# Patient Record
Sex: Male | Born: 1955 | Race: White | Hispanic: No | Marital: Single | Smoking: Current every day smoker
Health system: Southern US, Community
[De-identification: ages and names within clinical notes are randomized; demographics above are authoritative.]

## PROBLEM LIST (undated history)

## (undated) DIAGNOSIS — F329 Major depressive disorder, single episode, unspecified: Secondary | ICD-10-CM

## (undated) DIAGNOSIS — F17200 Nicotine dependence, unspecified, uncomplicated: Secondary | ICD-10-CM

## (undated) DIAGNOSIS — I10 Essential (primary) hypertension: Secondary | ICD-10-CM

## (undated) DIAGNOSIS — F32A Depression, unspecified: Secondary | ICD-10-CM

## (undated) DIAGNOSIS — M069 Rheumatoid arthritis, unspecified: Secondary | ICD-10-CM

## (undated) DIAGNOSIS — J449 Chronic obstructive pulmonary disease, unspecified: Secondary | ICD-10-CM

## (undated) DIAGNOSIS — Z720 Tobacco use: Secondary | ICD-10-CM

## (undated) HISTORY — DX: Nicotine dependence, unspecified, uncomplicated: F17.200

## (undated) HISTORY — PX: MULTIPLE TOOTH EXTRACTIONS: SHX2053

## (undated) HISTORY — DX: Rheumatoid arthritis, unspecified: M06.9

## (undated) HISTORY — DX: Tobacco use: Z72.0

## (undated) HISTORY — DX: Depression, unspecified: F32.A

## (undated) HISTORY — DX: Chronic obstructive pulmonary disease, unspecified: J44.9

## (undated) HISTORY — PX: ANKLE SURGERY: SHX546

---
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
## 1998-08-19 ENCOUNTER — Encounter: Payer: Self-pay | Admitting: Emergency Medicine

## 1998-08-19 ENCOUNTER — Emergency Department (HOSPITAL_COMMUNITY): Admission: EM | Admit: 1998-08-19 | Discharge: 1998-08-19 | Payer: Self-pay | Admitting: Emergency Medicine

## 2002-07-11 ENCOUNTER — Encounter: Payer: Self-pay | Admitting: Emergency Medicine

## 2002-07-11 ENCOUNTER — Emergency Department (HOSPITAL_COMMUNITY): Admission: EM | Admit: 2002-07-11 | Discharge: 2002-07-11 | Payer: Self-pay | Admitting: Emergency Medicine

## 2007-04-29 ENCOUNTER — Emergency Department (HOSPITAL_COMMUNITY): Admission: EM | Admit: 2007-04-29 | Discharge: 2007-04-29 | Payer: Self-pay | Admitting: Emergency Medicine

## 2009-01-15 ENCOUNTER — Emergency Department (HOSPITAL_COMMUNITY): Admission: EM | Admit: 2009-01-15 | Discharge: 2009-01-15 | Payer: Self-pay | Admitting: Emergency Medicine

## 2010-02-22 ENCOUNTER — Ambulatory Visit (HOSPITAL_COMMUNITY): Admission: RE | Admit: 2010-02-22 | Discharge: 2010-02-22 | Payer: Self-pay | Admitting: Oral Surgery

## 2010-05-03 ENCOUNTER — Emergency Department (HOSPITAL_COMMUNITY): Admission: EM | Admit: 2010-05-03 | Discharge: 2010-05-04 | Payer: Self-pay | Admitting: Emergency Medicine

## 2010-05-03 IMAGING — CR DG CHEST 1V PORT
1 series · 1 of 1 positions shown · non-contrast
Comparison: None.

CLINICAL DATA: Cough, shortness of breath, smoking history

PORTABLE CHEST - 1 VIEW

[view not recorded]
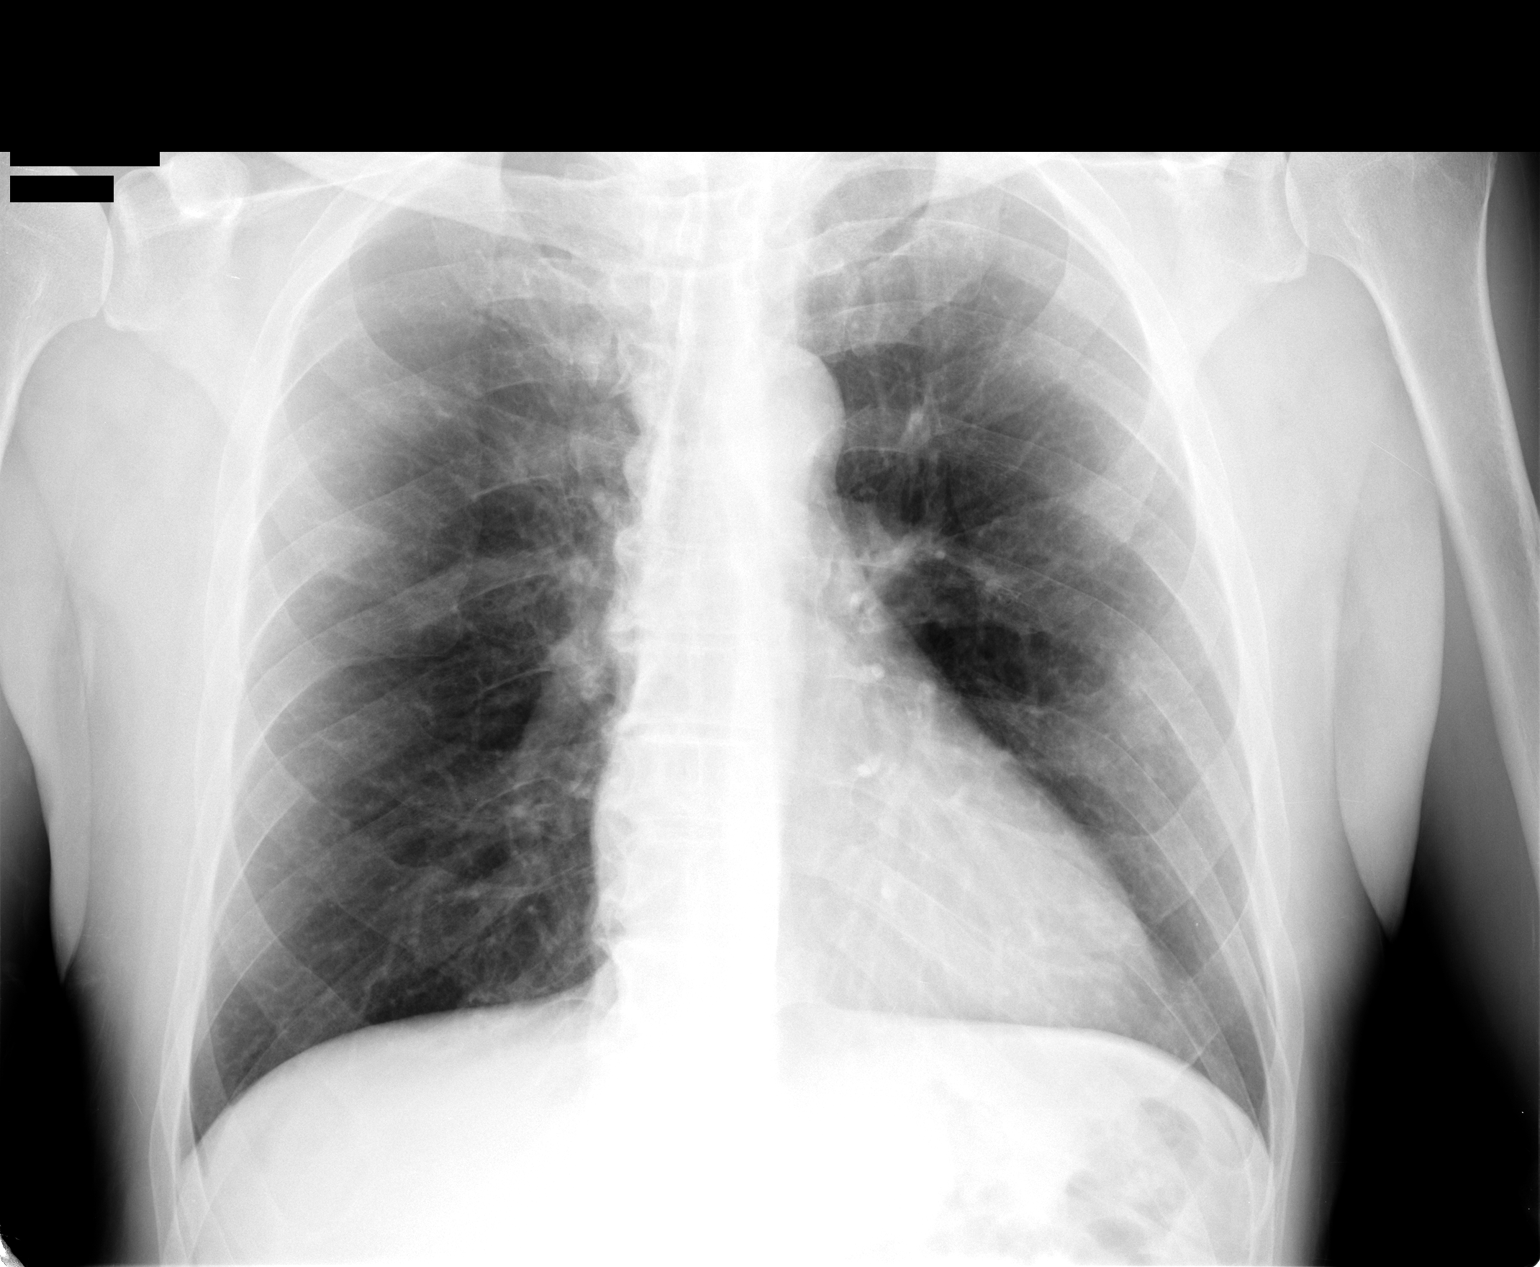

[1 of 1 positions shown; findings below may reference images not displayed]

FINDINGS: The lungs are clear.  Mediastinal contours appear normal.
The heart is mildly enlarged.  There are degenerative changes in
the thoracic spine.
IMPRESSION: No active lung disease.

## 2010-05-03 IMAGING — CT CT HEAD W/O CM
1 series · 16 of 30 positions shown, 20 images · non-contrast
Comparison: None.

CLINICAL DATA: Headache, neck and shoulder pain, weakness, cough

CT HEAD WITHOUT CONTRAST
TECHNIQUE: Contiguous axial images were obtained from the base of
the skull through the vertex without contrast.

[Series 2: headseq 4.8 h37s · axial · 0.43mm/px · z∈[+135,+290]mm · 16 of 36 slices shown, 20 images]
[im 2/36  brain]
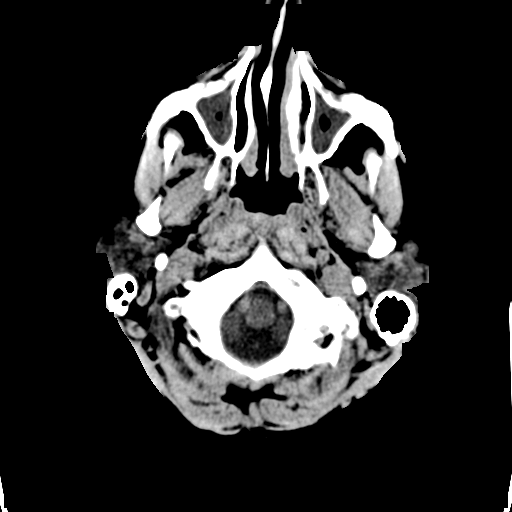
[im 2/36  bone]
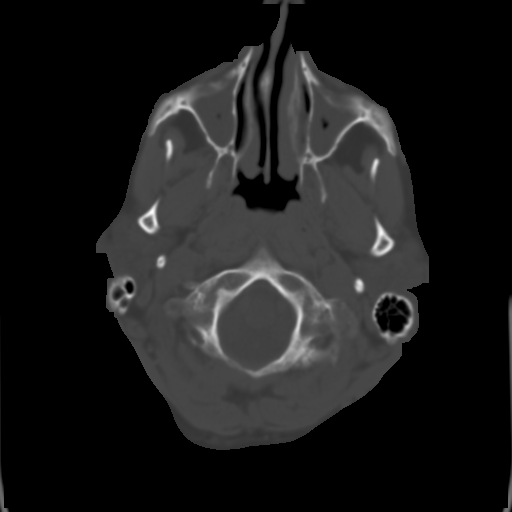
[im 4/36  brain]
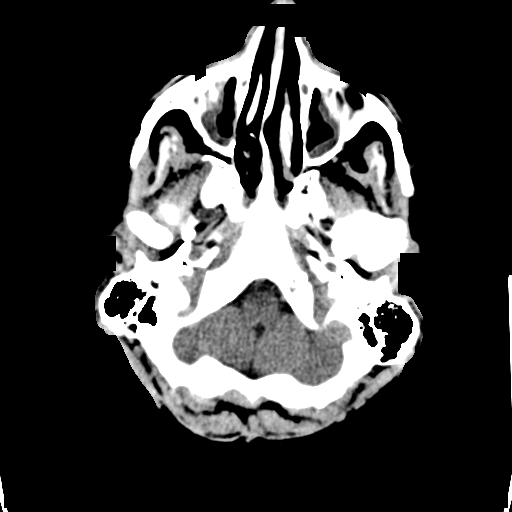
[im 7/36  brain]
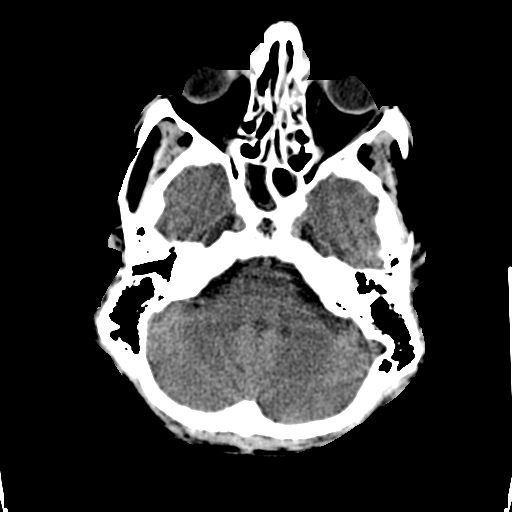
[im 9/36  brain]
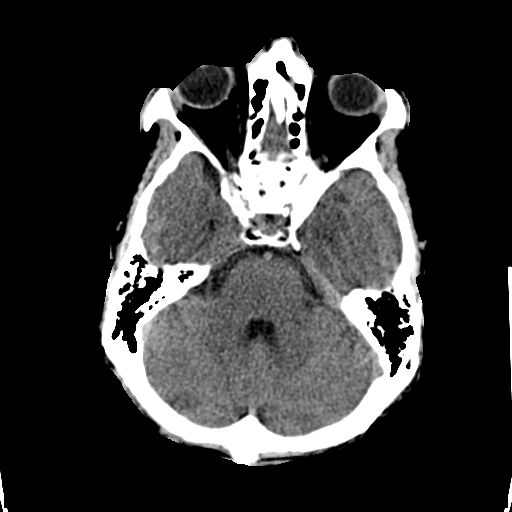
[im 10/36  brain]
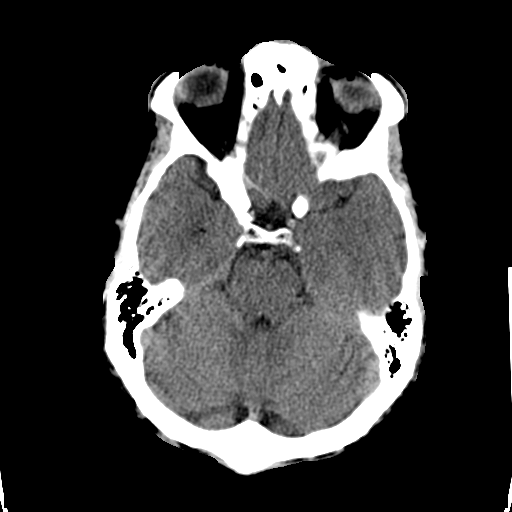
[im 10/36  bone]
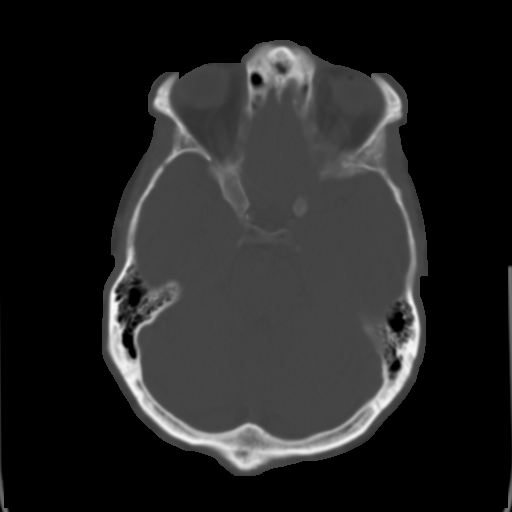
[im 13/36  brain]
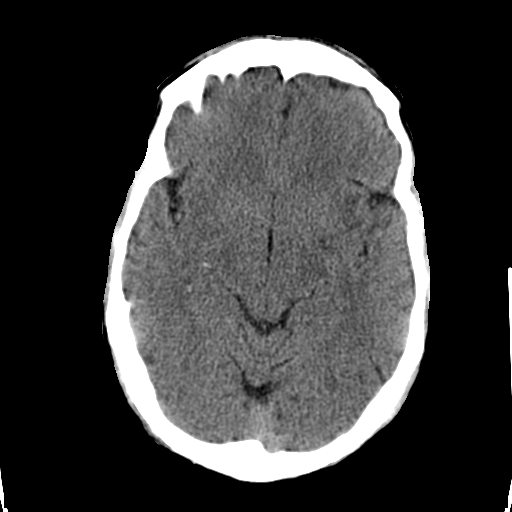
[im 15/36  brain]
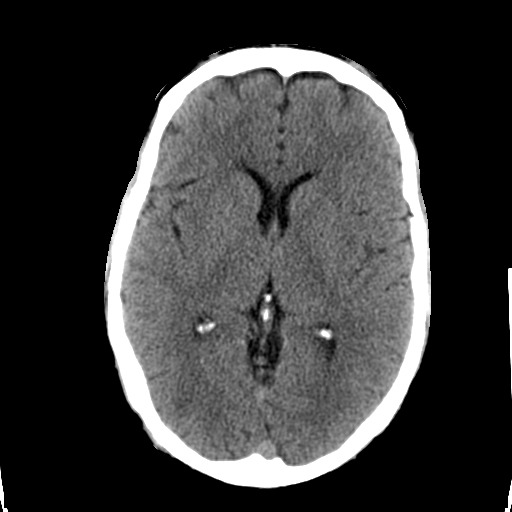
[im 17/36  brain]
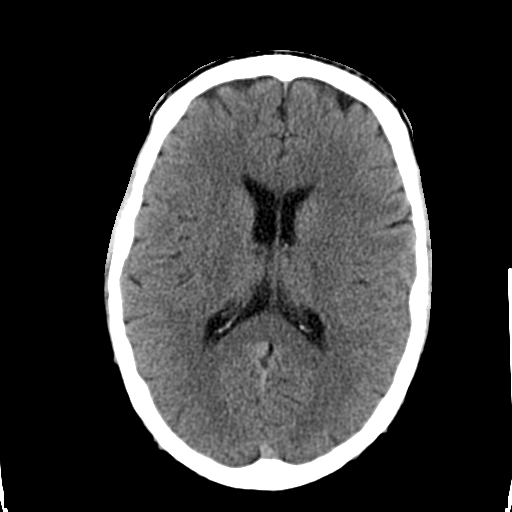
[im 19/36  brain]
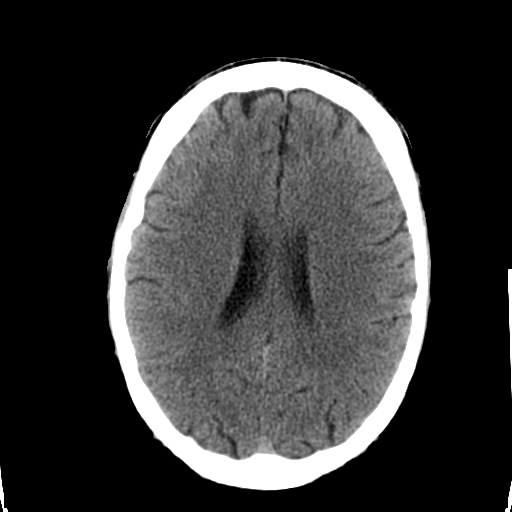
[im 19/36  bone]
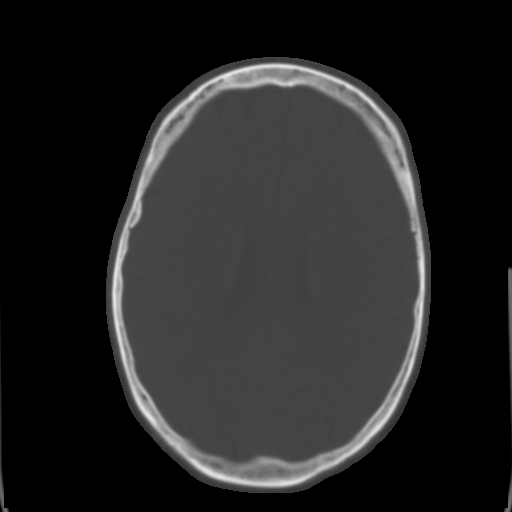
[im 21/36  brain]
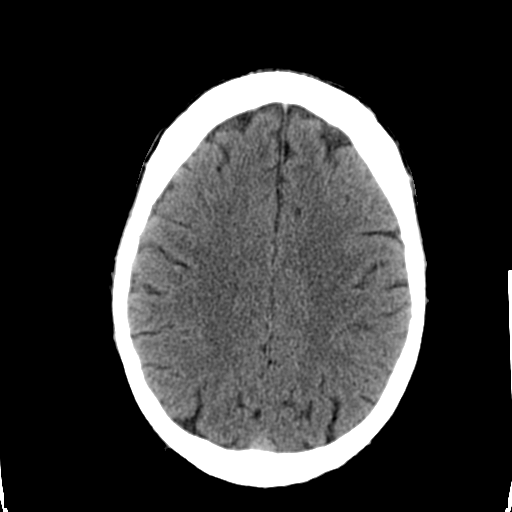
[im 23/36  brain]
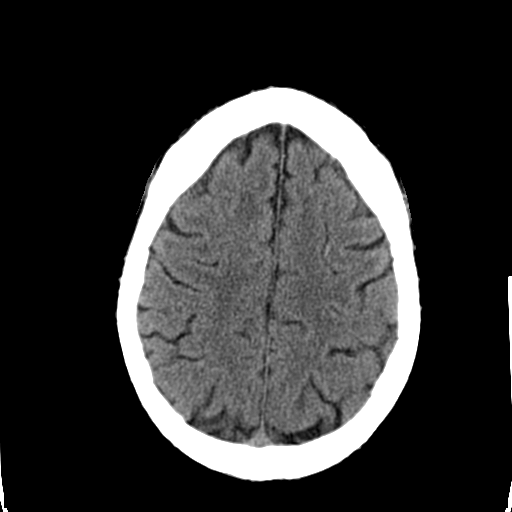
[im 26/36  brain]
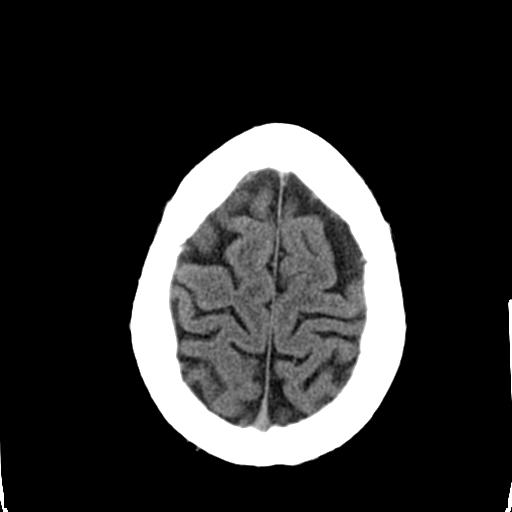
[im 27/36  brain]
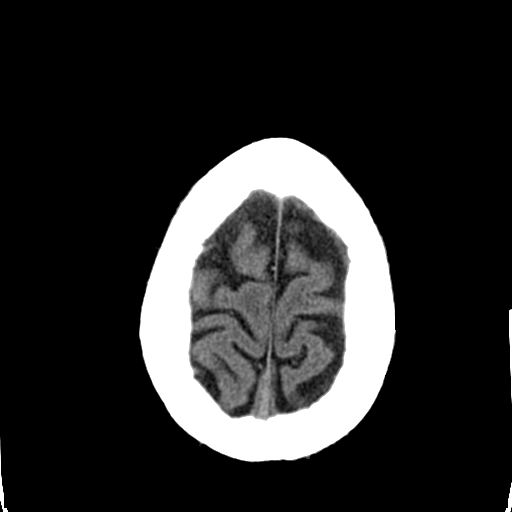
[im 27/36  bone]
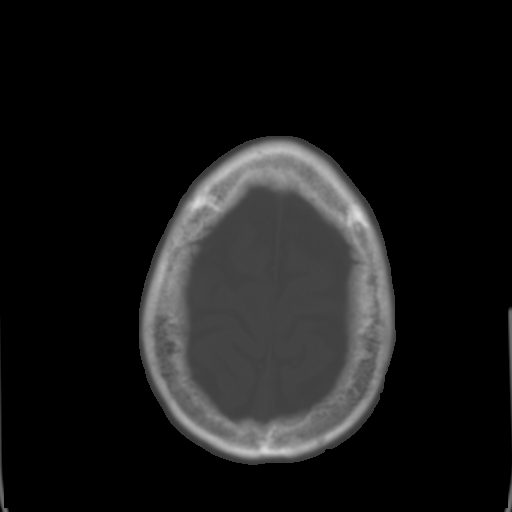
[im 29/36  brain]
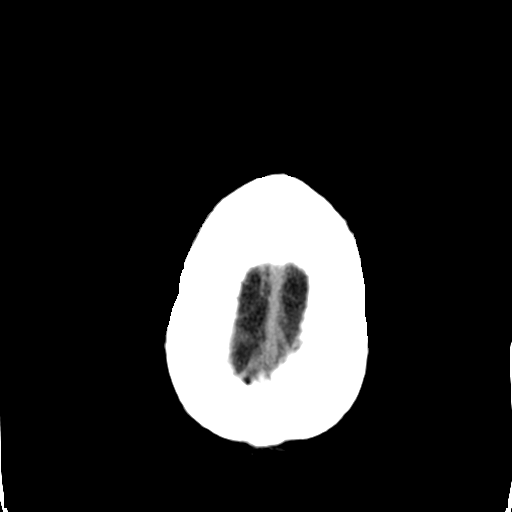
[im 32/36  brain]
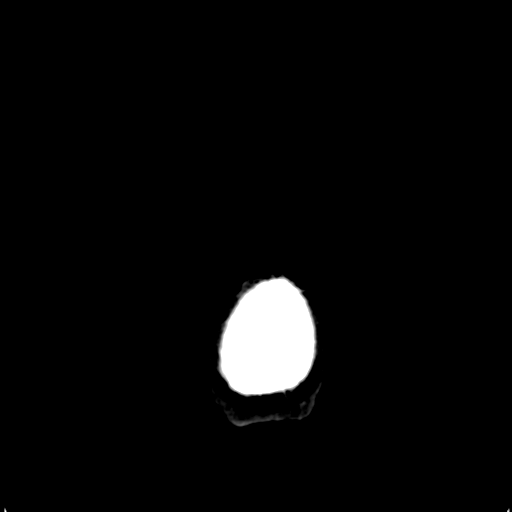
[im 34/36  brain]
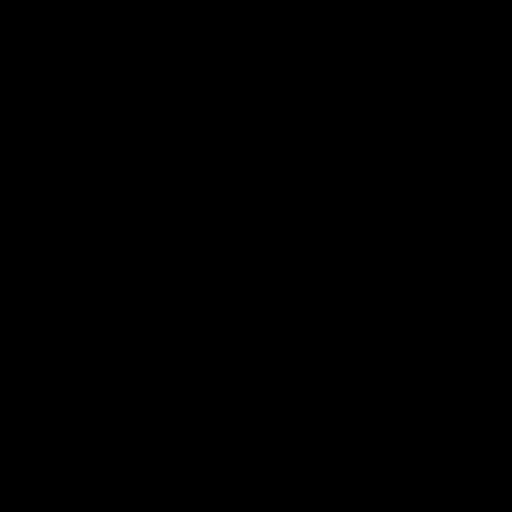

[16 of 30 positions shown; findings below may reference images not displayed]

FINDINGS: The ventricular system is normal in size and
configuration, and the septum is normal in a normal midline
position.  The fourth ventricle and basilar cisterns appear normal.
No hemorrhage, mass lesion, or acute infarction is seen.  On bone
window images there is considerable mucosal edema within the
maxillary sinuses with opacification of multiple ethmoid air cells
and mucosal edema within the sphenoid sinus consistent with diffuse
paranasal sinus disease.  The frontal sinuses are poorly developed.
No calvarial abnormality is seen.
IMPRESSION: 1.  No acute intracranial abnormality.
2.  Diffuse paranasal sinus disease.

## 2010-11-03 LAB — DIFFERENTIAL
Eosinophils Relative: 1 % (ref 0–5)
Lymphocytes Relative: 21 % (ref 12–46)
Lymphs Abs: 2 10*3/uL (ref 0.7–4.0)
Monocytes Absolute: 0.6 10*3/uL (ref 0.1–1.0)

## 2010-11-03 LAB — URINALYSIS, ROUTINE W REFLEX MICROSCOPIC
Hgb urine dipstick: NEGATIVE
Protein, ur: NEGATIVE mg/dL
Urobilinogen, UA: 2 mg/dL — ABNORMAL HIGH (ref 0.0–1.0)

## 2010-11-03 LAB — BASIC METABOLIC PANEL
BUN: 8 mg/dL (ref 6–23)
CO2: 24 mEq/L (ref 19–32)
Calcium: 9.3 mg/dL (ref 8.4–10.5)
Chloride: 105 mEq/L (ref 96–112)
Creatinine, Ser: 0.95 mg/dL (ref 0.4–1.5)
GFR calc Af Amer: >60 mL/min (ref >60)
GFR calc non Af Amer: >60 mL/min (ref >60)
Glucose, Bld: 87 mg/dL (ref 70–99)
Potassium: 3.6 mEq/L (ref 3.5–5.1)
Sodium: 137 mEq/L (ref 135–145)

## 2010-11-03 LAB — CBC
HCT: 43.7 % (ref 39.0–52.0)
Hemoglobin: 14.8 g/dL (ref 13.0–17.0)
MCH: 31.8 pg (ref 26.0–34.0)
MCHC: 33.8 g/dL (ref 30.0–36.0)
MCV: 94.1 fL (ref 78.0–100.0)
Platelets: 280 10*3/uL (ref 150–400)
RBC: 4.64 MIL/uL (ref 4.22–5.81)
RDW: 13.1 % (ref 11.5–15.5)
WBC: 9.6 10*3/uL (ref 4.0–10.5)

## 2010-11-03 LAB — HEPATIC FUNCTION PANEL
ALT: 23 U/L (ref 0–53)
Alkaline Phosphatase: 86 U/L (ref 39–117)
Bilirubin, Direct: 0.2 mg/dL (ref 0.0–0.3)
Indirect Bilirubin: 0.6 mg/dL (ref 0.3–0.9)

## 2010-11-06 LAB — BASIC METABOLIC PANEL
CO2: 28 mEq/L (ref 19–32)
Calcium: 9.6 mg/dL (ref 8.4–10.5)
Creatinine, Ser: 1.11 mg/dL (ref 0.4–1.5)
Glucose, Bld: 98 mg/dL (ref 70–99)
Sodium: 139 mEq/L (ref 135–145)

## 2010-11-06 LAB — CBC
Hemoglobin: 14.9 g/dL (ref 13.0–17.0)
MCH: 33.5 pg (ref 26.0–34.0)
MCHC: 35.3 g/dL (ref 30.0–36.0)
MCV: 95 fL (ref 78.0–100.0)

## 2010-11-29 LAB — URINALYSIS, ROUTINE W REFLEX MICROSCOPIC
Bilirubin Urine: NEGATIVE
Glucose, UA: NEGATIVE mg/dL
Ketones, ur: NEGATIVE mg/dL
Protein, ur: NEGATIVE mg/dL

## 2011-06-02 LAB — ROCKY MTN SPOTTED FVR AB, IGM-BLOOD: RMSF IgM: 0.17

## 2011-06-02 LAB — ROCKY MTN SPOTTED FVR AB, IGG-BLOOD: RMSF IgG: <1:64 {titer}

## 2015-02-02 ENCOUNTER — Encounter (HOSPITAL_COMMUNITY): Payer: Self-pay

## 2015-02-02 ENCOUNTER — Emergency Department (HOSPITAL_COMMUNITY): Payer: Self-pay

## 2015-02-02 ENCOUNTER — Emergency Department (HOSPITAL_COMMUNITY)
Admission: EM | Admit: 2015-02-02 | Discharge: 2015-02-02 | Disposition: A | Discharge status: Home / Self Care | Payer: Self-pay | Attending: Emergency Medicine | Admitting: Emergency Medicine

## 2015-02-02 DIAGNOSIS — M2550 Pain in unspecified joint: Secondary | ICD-10-CM

## 2015-02-02 DIAGNOSIS — Z9889 Other specified postprocedural states: Secondary | ICD-10-CM | POA: Insufficient documentation

## 2015-02-02 DIAGNOSIS — R002 Palpitations: Secondary | ICD-10-CM

## 2015-02-02 DIAGNOSIS — M25571 Pain in right ankle and joints of right foot: Secondary | ICD-10-CM | POA: Insufficient documentation

## 2015-02-02 DIAGNOSIS — I10 Essential (primary) hypertension: Secondary | ICD-10-CM | POA: Insufficient documentation

## 2015-02-02 DIAGNOSIS — Z720 Tobacco use: Secondary | ICD-10-CM | POA: Insufficient documentation

## 2015-02-02 DIAGNOSIS — R0789 Other chest pain: Secondary | ICD-10-CM | POA: Insufficient documentation

## 2015-02-02 HISTORY — DX: Essential (primary) hypertension: I10

## 2015-02-02 IMAGING — DX DG CHEST 2V
2 series · 2 of 2 positions shown · non-contrast
Comparison: [DATE]

CLINICAL DATA: Diffuse chest pain on and off for 2-3 months

EXAM:
CHEST  2 VIEW

[chest pa]
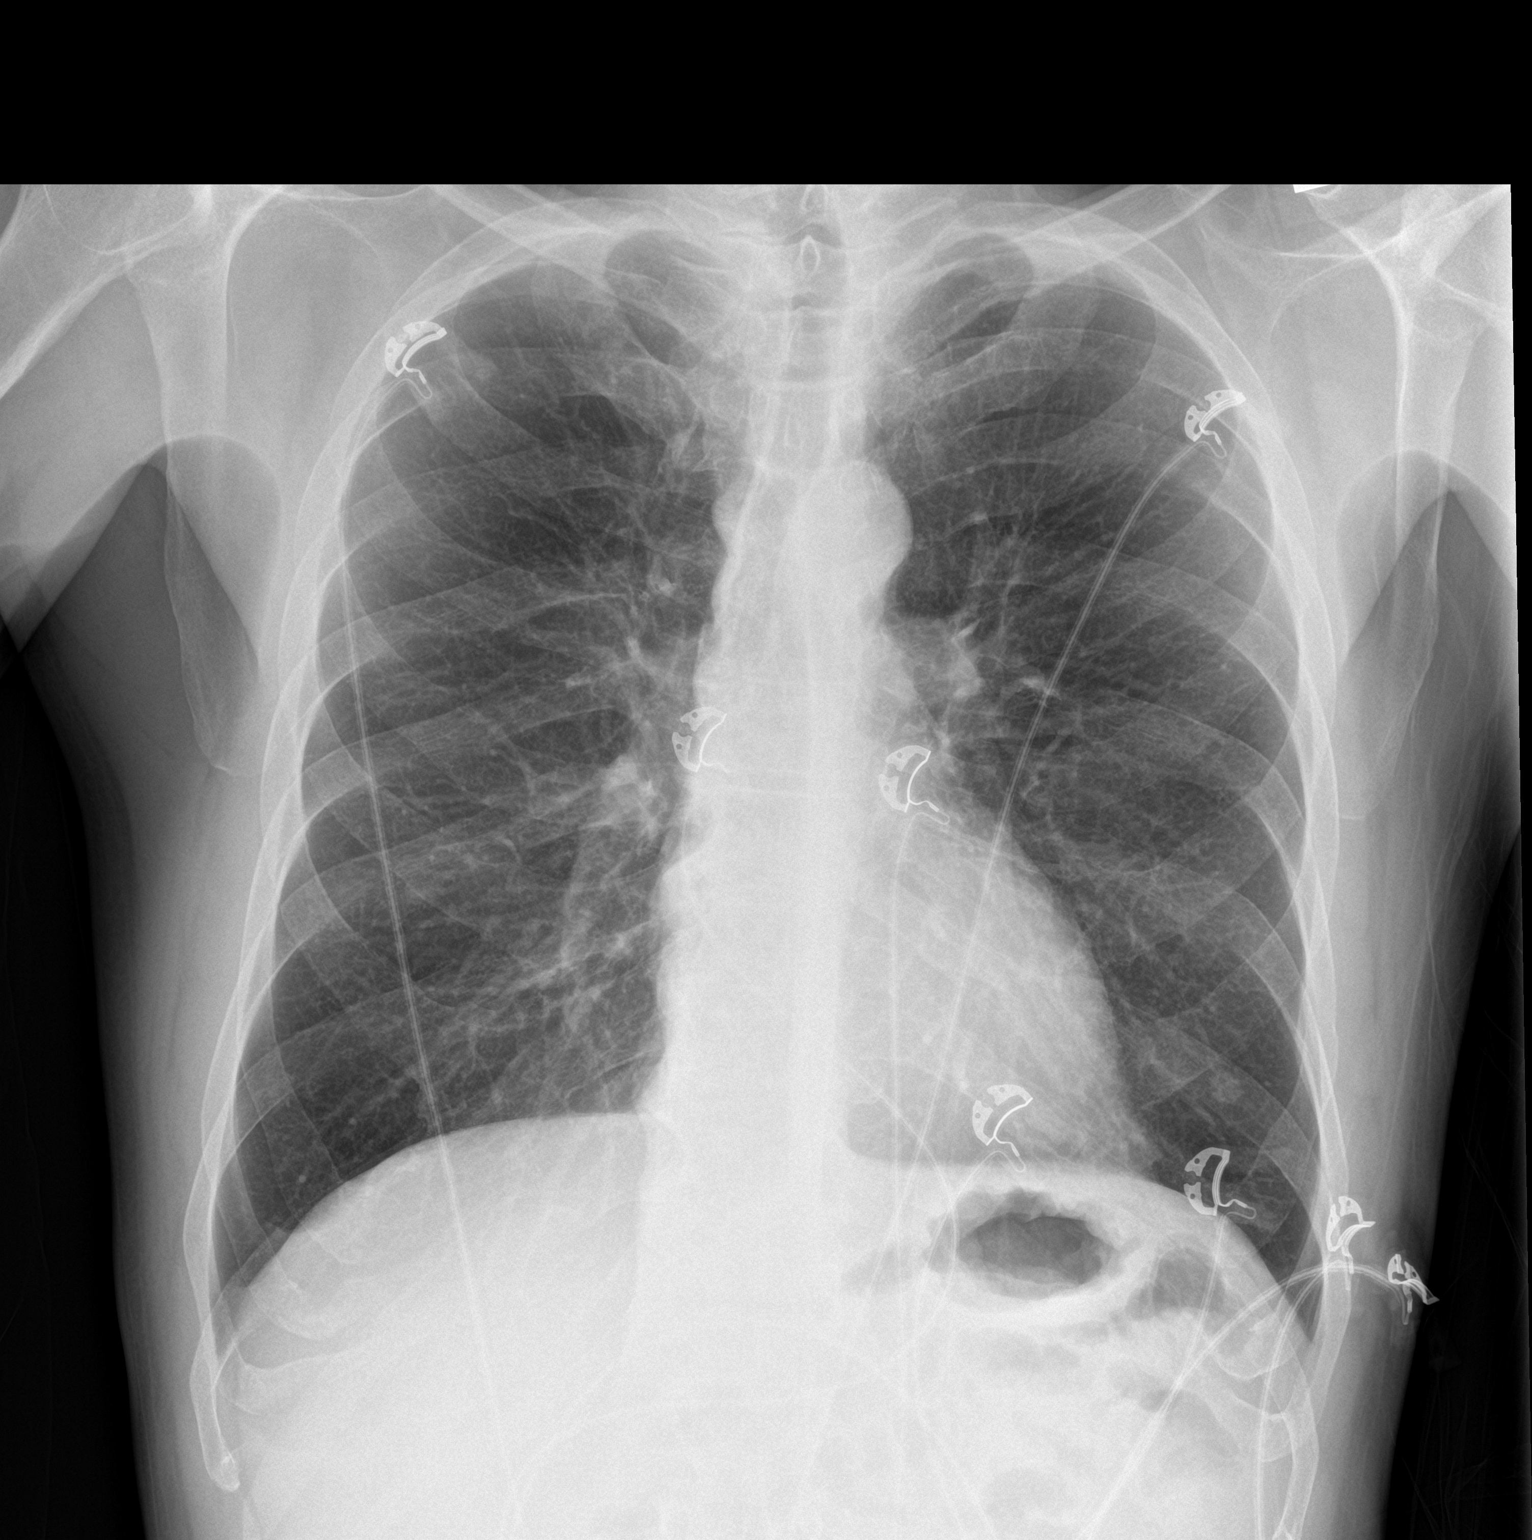

[chest lat]
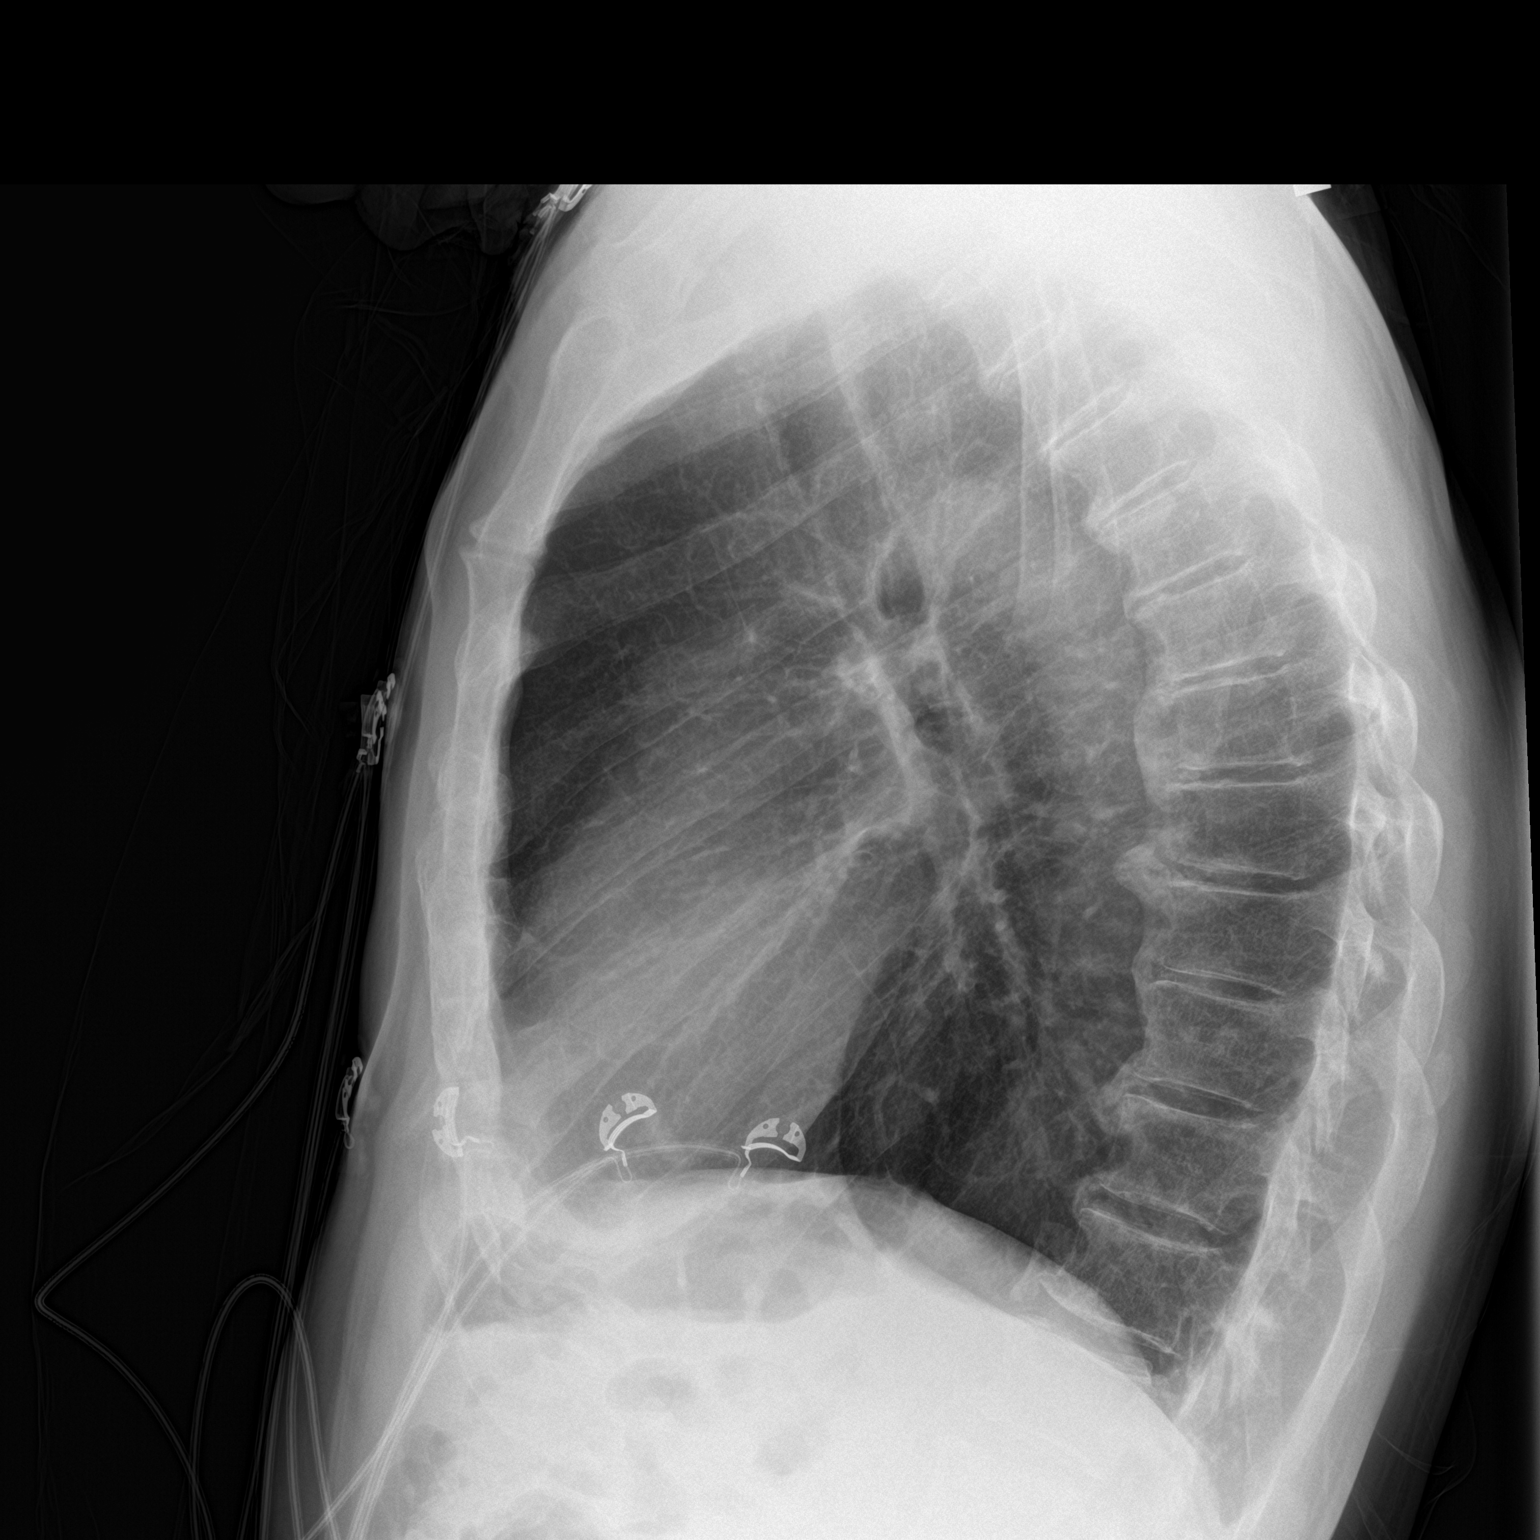

[2 of 2 positions shown; findings below may reference images not displayed]

FINDINGS: Normal heart size and mediastinal contours. No acute infiltrate or
edema. No effusion or pneumothorax. No acute osseous findings.

10 mm nodular density overlapping the posterior left ninth rib. No
sclerotic focus seen on the previous study and there is no symmetric
opacity or visible nipple in the lateral projection to confirm
nipple shadow. Given current every day smoking status, would favor
chest CT follow-up rather than repeat with nipple markers.
IMPRESSION: 1. No active cardiopulmonary disease.
2. 10 mm nodule at the left base may be an nipple shadow, but
noncontrast chest CT follow-up is recommended to exclude nodule in
this current every day smoker.

## 2015-02-02 IMAGING — DX DG ANKLE COMPLETE 3+V*R*
3 series · 3 of 3 positions shown · non-contrast
Comparison: None.

CLINICAL DATA: Pain and swelling for 4 days.  No recent trauma

EXAM:
RIGHT ANKLE - COMPLETE 3+ VIEW

[ankle ap]
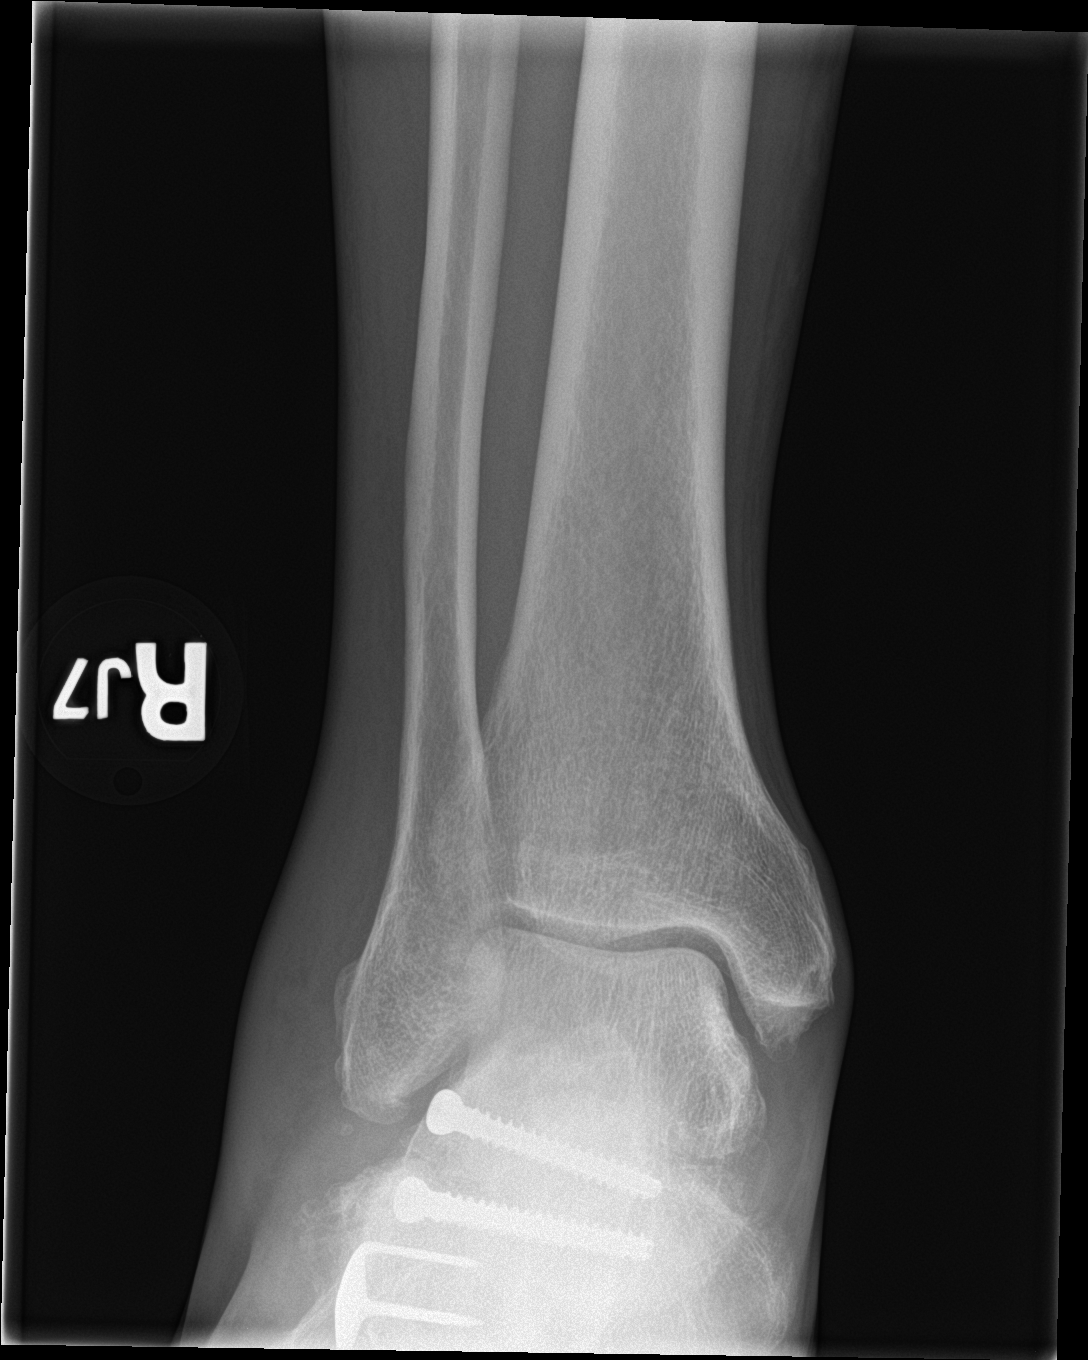

[ankle obl]
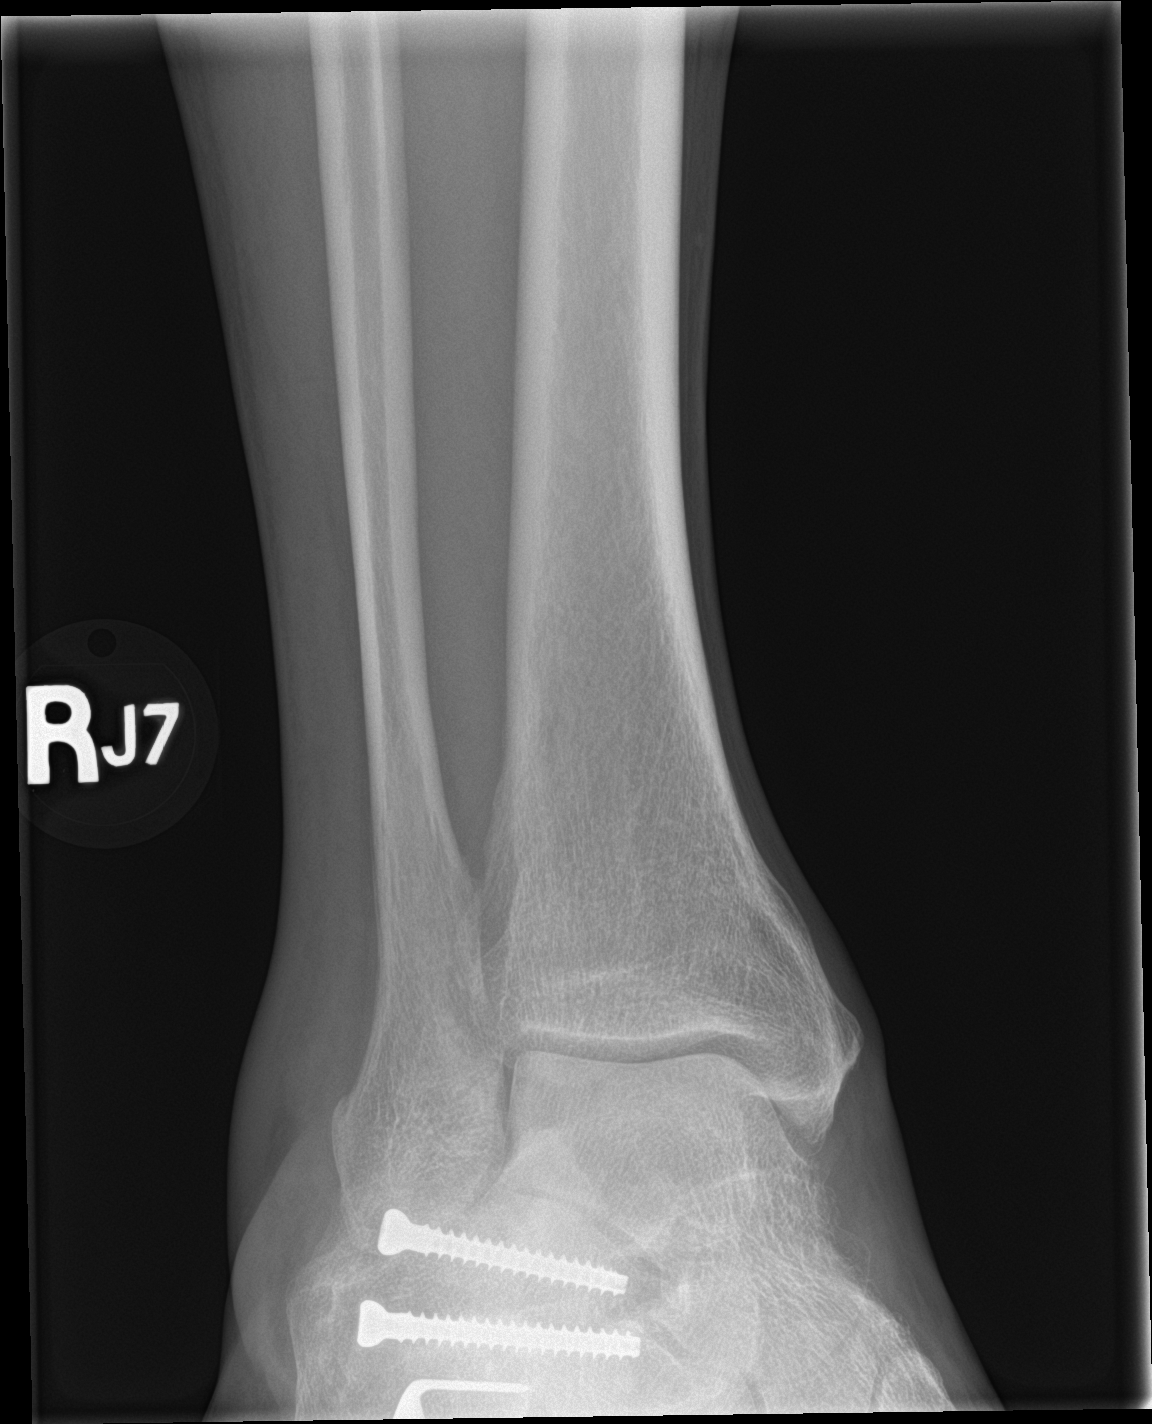

[ankle lat]
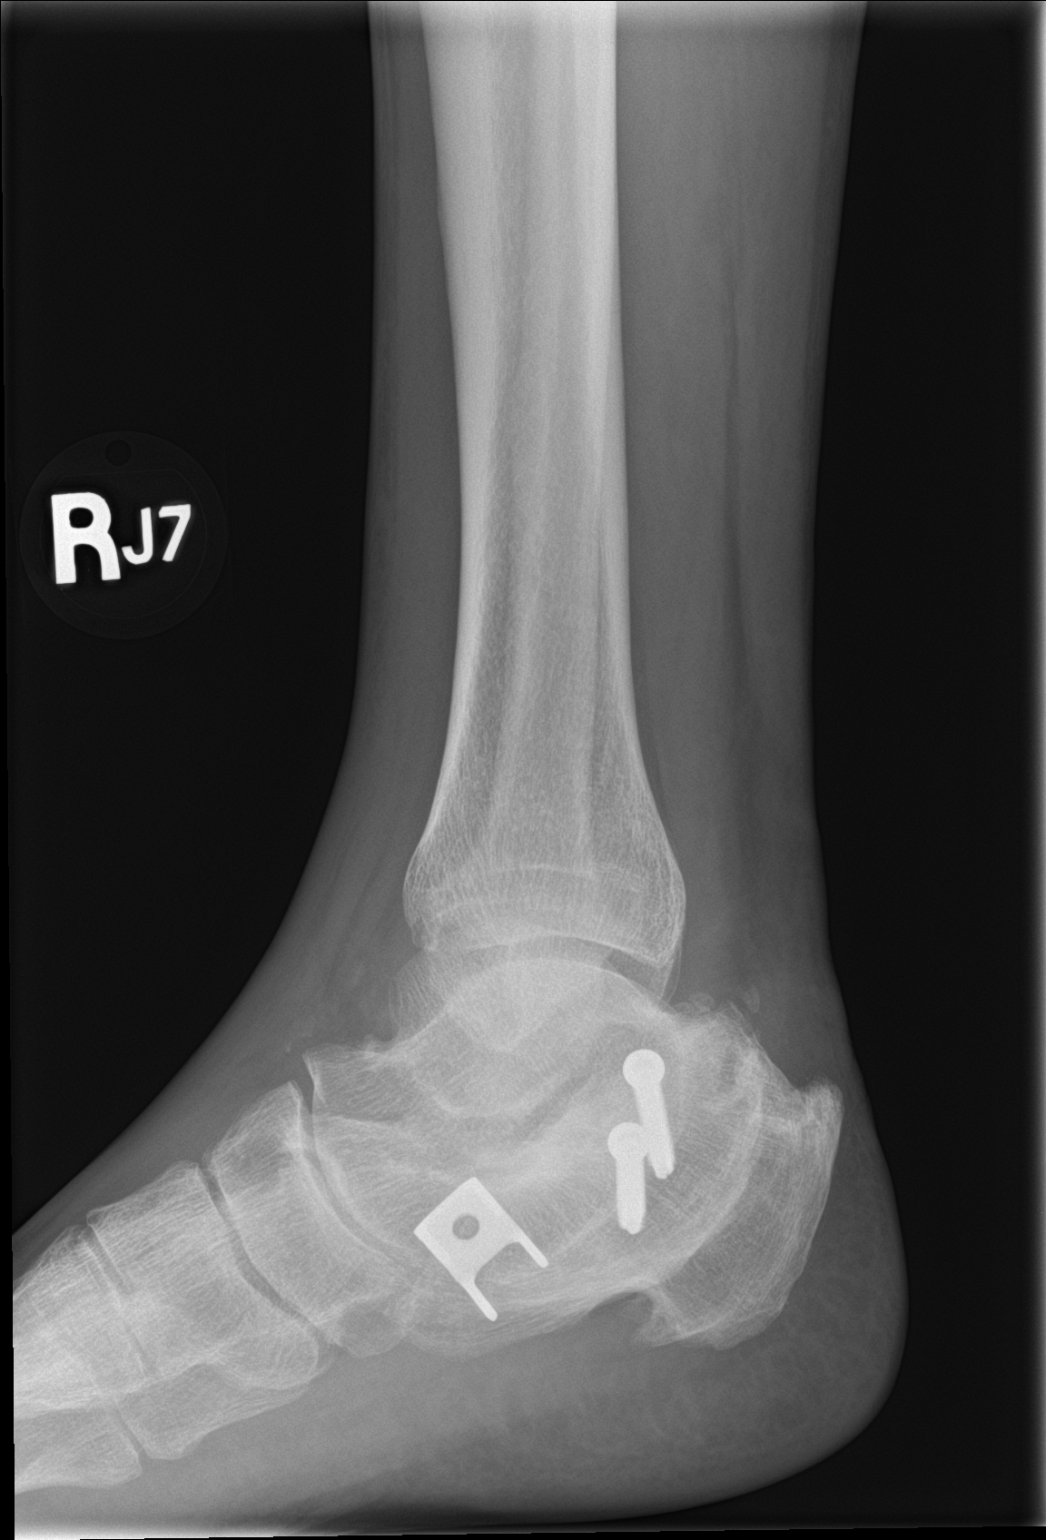

[3 of 3 positions shown; findings below may reference images not displayed]

FINDINGS: Frontal, oblique, and lateral views were obtained. There is evidence
of old trauma involving the calcaneus with remodeling and
postoperative change. There is moderate osteoarthritic change in the
ankle joint. There are spurs arising from the posterior and inferior
calcaneus.

There is no acute fracture. There is soft tissue swelling laterally.
The ankle mortise appears intact. There is no appreciable joint
effusion.
IMPRESSION: Soft tissue swelling laterally. No acute fracture. Mortise intact.
Evidence of old calcaneal trauma with remodeling and postoperative
change. Moderate osteoarthritic change. Inferior and posterior
calcaneal spurs are present.

## 2015-02-02 MED ORDER — DOXYCYCLINE HYCLATE 100 MG PO CAPS: 100.0000 mg | ORAL_CAPSULE | Freq: Two times a day (BID) | ORAL | Status: DC

## 2015-02-02 MED ORDER — DEXAMETHASONE SODIUM PHOSPHATE 10 MG/ML IJ SOLN
10.0000 mg | Freq: Once | INTRAMUSCULAR | Status: AC
  Administered 2015-02-02: 10 mg via INTRAMUSCULAR
  Filled 2015-02-02: qty 1

## 2015-02-02 MED ORDER — HYDROCODONE-ACETAMINOPHEN 5-325 MG PO TABS: 2.0000 | ORAL_TABLET | ORAL | Status: DC | PRN

## 2015-02-02 NOTE — ED Provider Notes (Signed)
CSN: [id number]     Arrival date & time 02/02/15  1158 History  This chart was scribed for Orpah Greek, MD by Rayna Sexton, ED scribe. This patient was seen in room APA04/APA04 and the patient's care was started at 12:17 PM.    Chief Complaint  Patient presents with  . Foot Pain  . Palpitations    The history is provided by the patient. No language interpreter was used.    HPI Comments: Drew Nguyen is a 59 y.o. male, with a history of HTN, smoking and ankle surgery, who presents to the Emergency Department complaining of constant, mild, right ankle pain with onset 3 days ago. Pt notes a past surgery to the affected ankle 20 years prior but denies any history of similar pain s/p the procedure. Pt additionally notes pain to his left ankle as well as chronic chest pains with worsening symptoms when the pain intensifies. 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 He denies any history of gout. Pt denies any other associated symptoms.    Past Medical History  Diagnosis Date  . Hypertension    Past Surgical History  Procedure Laterality Date  . Ankle surgery     No family history on file. History  Substance Use Topics  . Smoking status: Current Every Day Smoker  . Smokeless tobacco: Not on file  . Alcohol Use: Yes     Comment: occ    Review of Systems  Cardiovascular: Positive for chest pain.  Musculoskeletal: Positive for myalgias, joint swelling and arthralgias.  All other systems reviewed and are negative.     Allergies  Review of patient's allergies indicates no known allergies.  Home Medications   Prior to Admission medications   Medication Sig Start Date End Date Taking? Authorizing Provider  ibuprofen (ADVIL,MOTRIN) 200 MG tablet Take 400 mg by mouth every 6 (six) hours as needed for moderate pain.   Yes Historical Provider, MD   BP 123/95 mmHg  Pulse 86  Temp(Src) 98 F (36.7 C) (Oral)  Resp 18  Ht '5\' 9"'$  (1.753 m)   Wt 156 lb (70.761 kg)  BMI 23.03 kg/m2  SpO2 100% Physical Exam  Constitutional: He is oriented to person, place, and time. He appears well-developed and well-nourished. No distress.  HENT:  Head: Normocephalic and atraumatic.  Right Ear: Hearing normal.  Left Ear: Hearing normal.  Nose: Nose normal.  Mouth/Throat: Oropharynx is clear and moist and mucous membranes are normal.  Eyes: Conjunctivae and EOM are normal. Pupils are equal, round, and reactive to light.  Neck: Normal range of motion. Neck supple.  Cardiovascular: Regular rhythm, S1 normal and S2 normal.  Exam reveals no gallop and no friction rub.   No murmur heard. Pulmonary/Chest: Effort normal and breath sounds normal. No respiratory distress. He exhibits no tenderness.  Abdominal: Soft. Normal appearance and bowel sounds are normal. There is no hepatosplenomegaly. There is no tenderness. There is no rebound, no guarding, no tenderness at McBurney's point and negative Murphy's sign. No hernia.  Musculoskeletal: He exhibits edema and tenderness.  Swelling right lateral ankle with tenderness and erythema  Neurological: He is alert and oriented to person, place, and time. He has normal strength. No cranial nerve deficit or sensory deficit. Coordination normal. GCS eye subscore is 4. GCS verbal subscore is 5. GCS motor subscore is 6.  Skin: Skin is warm, dry and intact. No rash noted. No cyanosis.  Psychiatric: He has a normal mood and affect. His  speech is normal and behavior is normal. Thought content normal.  Nursing note and vitals reviewed.   ED Course  Procedures  DIAGNOSTIC STUDIES: Oxygen Saturation is 100% on RA, normal by my interpretation.    COORDINATION OF CARE: 12:21 PM Discussed treatment plan with pt at bedside and pt agreed to plan.  Labs Review Labs Reviewed - No data to display  Imaging Review Dg Chest 2 View  02/02/2015   CLINICAL DATA:  Diffuse chest pain on and off for 2-3 months  EXAM: CHEST  2  VIEW  COMPARISON:  05/03/2010  FINDINGS: Normal heart size and mediastinal contours. No acute infiltrate or edema. No effusion or pneumothorax. No acute osseous findings.  10 mm nodular density overlapping the posterior left ninth rib. No sclerotic focus seen on the previous study and there is no symmetric opacity or visible nipple in the lateral projection to confirm nipple shadow. Given current every day smoking status, would favor chest CT follow-up rather than repeat with nipple markers.  IMPRESSION: 1. No active cardiopulmonary disease. 2. 10 mm nodule at the left base may be an nipple shadow, but noncontrast chest CT follow-up is recommended to exclude nodule in this current every day smoker.   Electronically Signed   By: Monte Fantasia M.D.   On: 02/02/2015 13:35   Dg Ankle Complete Right  02/02/2015   CLINICAL DATA:  Pain and swelling for 4 days.  No recent trauma  EXAM: RIGHT ANKLE - COMPLETE 3+ VIEW  COMPARISON:  None.  FINDINGS: Frontal, oblique, and lateral views were obtained. There is evidence of old trauma involving the calcaneus with remodeling and postoperative change. 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 There are spurs arising from the posterior and inferior calcaneus.  There is no acute fracture. There is soft tissue swelling laterally. The ankle mortise appears intact. There is no appreciable joint effusion.  IMPRESSION: Soft tissue swelling laterally. No acute fracture. Mortise intact. Evidence of old calcaneal trauma with remodeling and postoperative change. Moderate osteoarthritic change. Inferior and posterior calcaneal spurs are present.   Electronically Signed   By: Lowella Grip III M.D.   On: 02/02/2015 13:35     EKG Interpretation   Date/Time:  Tuesday February 02 2015 12:15:29 EDT Ventricular Rate:  74 PR Interval:  146 QRS Duration: 89 QT Interval:  381 QTC Calculation: 423 R Axis:   -43 Text Interpretation:  Sinus rhythm Left axis deviation  Otherwise within  normal limits Confirmed by POLLINA  MD, CHRISTOPHER [phone]) on 02/02/2015  12:41:20 PM      MDM   Final diagnoses:  Palpitations   ankle pain, possible gout  Patient presents to the ER for evaluation of pain and swelling of the right ankle. Examination did reveal erythema over the lateral malleolus, no obvious joint effusion. X-ray does not show any acute abnormality other than lateral soft tissue swelling. Patient is afebrile with normal vital signs. This does not suggest septic joint. Cannot rule out early cellulitis, although favor gout. Will treat for both.  Patient also complaining of palpitations which occur intermittently. It is not occurring currently. Chest x-ray was clear. He is a smoker. He will require outpatient follow-up for possible nodule. Will refer for primary care. EKG does not show any arrhythmia.  I personally performed the services described in this documentation, which was scribed in my presence. The recorded information has been reviewed and is accurate.     Orpah Greek, MD 02/02/15 1350

## 2015-02-02 NOTE — Discharge Instructions (Signed)
Palpitations A palpitation is the feeling that your heartbeat is irregular. It may feel like your heart is fluttering or skipping a beat. It may also feel like your heart is beating faster than normal. This is usually not a serious problem. In some cases, you may need more medical tests. HOME CARE  Avoid:  Caffeine in coffee, tea, soft drinks, diet pills, and energy drinks.  Chocolate.  Alcohol.  Stop smoking if you smoke.  Reduce your stress and anxiety. Try:  A method that measures bodily functions so you can learn to control them (biofeedback).  Yoga.  Meditation.  Physical activity such as swimming, jogging, or walking.  Get plenty of rest and sleep. GET HELP IF:  Your fast or irregular heartbeat continues after 24 hours.  Your palpitations occur more often. GET HELP RIGHT AWAY IF:   You have chest pain.  You feel short of breath.  You have a very bad headache.  You feel dizzy or pass out (faint). MAKE SURE YOU:   Understand these instructions.  Will watch your condition.  Will get help right away if you are not doing well or get worse. Document Released: 05/16/2008 Document Revised: 12/22/2013 Document Reviewed: 10/06/2011 [facility] Patient Information 2015 [place], [place]. This information is not intended to replace advice given to you by your health care provider. Make sure you discuss any questions you have with your health care provider. Gout Gout is an inflammatory arthritis caused by a buildup of uric acid crystals in the joints. Uric acid is a chemical that is normally present in the blood. When the level of uric acid in the blood is too high it can form crystals that deposit in your joints and tissues. This causes joint redness, soreness, and swelling (inflammation). Repeat attacks are common. Over time, uric acid crystals can form into masses (tophi) near a joint, destroying bone and causing disfigurement. Gout is treatable and often preventable. CAUSES    The disease begins with elevated levels of uric acid in the blood. Uric acid is produced by your body when it breaks down a naturally found substance called purines. Certain foods you eat, such as meats and fish, contain high amounts of purines. Causes of an elevated uric acid level include:  Being passed down from parent to child (heredity).  Diseases that cause increased uric acid production (such as obesity, psoriasis, and certain cancers).  Excessive alcohol use.  Diet, especially diets rich in meat and seafood.  Medicines, including certain cancer-fighting medicines (chemotherapy), water pills (diuretics), and aspirin.  Chronic kidney disease. The kidneys are no longer able to remove uric acid well.  Problems with metabolism. Conditions strongly associated with gout include:  Obesity.  High blood pressure.  High cholesterol.  Diabetes. Not everyone with elevated uric acid levels gets gout. It is not understood why some people get gout and others do not. Surgery, joint injury, and eating too much of certain foods are some of the factors that can lead to gout attacks. SYMPTOMS   An attack of gout comes on quickly. It causes intense pain with redness, swelling, and warmth in a joint.  Fever can occur.  Often, only one joint is involved. Certain joints are more commonly involved:  Base of the big toe.  Knee.  Ankle.  Wrist.  Finger. Without treatment, an attack usually goes away in a few days to weeks. Between attacks, you usually will not have symptoms, which is different from many other forms of arthritis. DIAGNOSIS  Your caregiver  will suspect gout based on your symptoms and exam. In some cases, tests may be recommended. The tests may include:  Blood tests.  Urine tests.  X-rays.  Joint fluid exam. This exam requires a needle to remove fluid from the joint (arthrocentesis). Using a microscope, gout is confirmed when uric acid crystals are seen in the joint  fluid. TREATMENT  There are two phases to gout treatment: treating the sudden onset (acute) attack and preventing attacks (prophylaxis).  Treatment of an Acute Attack.  Medicines are used. These include anti-inflammatory medicines or steroid medicines.  An injection of steroid medicine into the affected joint is sometimes necessary.  The painful joint is rested. Movement can worsen the arthritis.  You may use warm or cold treatments on painful joints, depending which works best for you.  Treatment to Prevent Attacks.  If you suffer from frequent gout attacks, your caregiver may advise preventive medicine. These medicines are started after the acute attack subsides. These medicines either help your kidneys eliminate uric acid from your body or decrease your uric acid production. You may need to stay on these medicines for a very long time.  The early phase of treatment with preventive medicine can be associated with an increase in acute gout attacks. For this reason, during the first few months of treatment, your caregiver may also advise you to take medicines usually used for acute gout treatment. Be sure you understand your caregiver's directions. Your caregiver may make several adjustments to your medicine dose before these medicines are effective.  Discuss dietary treatment with your caregiver or dietitian. Alcohol and drinks high in sugar and fructose and foods such as meat, poultry, and seafood can increase uric acid levels. Your caregiver or dietitian can advise you on drinks and foods that should be limited. HOME CARE INSTRUCTIONS   Do not take aspirin to relieve pain. This raises uric acid levels.  Only take over-the-counter or prescription medicines for pain, discomfort, or fever as directed by your caregiver.  Rest the joint as much as possible. When in bed, keep sheets and blankets off painful areas.  Keep the affected joint raised (elevated).  Apply warm or cold treatments  to painful joints. Use of warm or cold treatments depends on which works best for you.  Use crutches if the painful joint is in your leg.  Drink enough fluids to keep your urine clear or pale yellow. This helps your body get rid of uric acid. Limit alcohol, sugary drinks, and fructose drinks.  Follow your dietary instructions. Pay careful attention to the amount of protein you eat. Your daily diet should emphasize fruits, vegetables, whole grains, and fat-free or low-fat milk products. Discuss the use of coffee, vitamin C, and cherries with your caregiver or dietitian. These may be helpful in lowering uric acid levels.  Maintain a healthy body weight. SEEK MEDICAL CARE IF:   You develop diarrhea, vomiting, or any side effects from medicines.  You do not feel better in 24 hours, or you are getting worse. SEEK IMMEDIATE MEDICAL CARE IF:   Your joint becomes suddenly more tender, and you have chills or a fever. MAKE SURE YOU:   Understand these instructions.  Will watch your condition.  Will get help right away if you are not doing well or get worse. Document Released: 08/04/2000 Document Revised: 12/22/2013 Document Reviewed: 03/20/2012 [facility] Patient Information 2015 [place], [place]. This information is not intended to replace advice given to you by your health care provider. Make sure you  discuss any questions you have with your health care provider. ° °

## 2015-02-02 NOTE — ED Notes (Addendum)
Pt c/o pain in r ankle and foot for past few days.  Denies injury.  Reports surgery to area over 20 years ago.  Pt also says feels like heart is skipping for " a while."  Pt says being in pain makes it worse.    Redness noted to r ankle, pedal pulse present, and capillary refill wnl.

## 2015-02-02 NOTE — ED Notes (Signed)
MD at bedside. 

## 2018-07-31 ENCOUNTER — Institutional Professional Consult (permissible substitution): Payer: Self-pay | Admitting: Internal Medicine

## 2018-09-13 ENCOUNTER — Ambulatory Visit (INDEPENDENT_AMBULATORY_CARE_PROVIDER_SITE_OTHER): Payer: Medicaid Other | Admitting: Pulmonary Disease

## 2018-09-13 ENCOUNTER — Encounter: Payer: Self-pay | Admitting: Pulmonary Disease

## 2018-09-13 VITALS — BP 160/94 | HR 65 | Ht 69.0 in | Wt 138.0 lb

## 2018-09-13 DIAGNOSIS — J449 Chronic obstructive pulmonary disease, unspecified: Secondary | ICD-10-CM | POA: Insufficient documentation

## 2018-09-13 DIAGNOSIS — J4489 Other specified chronic obstructive pulmonary disease: Secondary | ICD-10-CM

## 2018-09-13 DIAGNOSIS — Z720 Tobacco use: Secondary | ICD-10-CM | POA: Diagnosis not present

## 2018-09-13 DIAGNOSIS — Z23 Encounter for immunization: Secondary | ICD-10-CM | POA: Diagnosis not present

## 2018-09-13 DIAGNOSIS — M069 Rheumatoid arthritis, unspecified: Secondary | ICD-10-CM | POA: Diagnosis not present

## 2018-09-13 MED ORDER — BUPROPION HCL ER (SR) 150 MG PO TB12: 150.0000 mg | ORAL_TABLET | Freq: Two times a day (BID) | ORAL | 2 refills | Status: DC

## 2018-09-13 NOTE — Assessment & Plan Note (Signed)
Schedule PFTs-based on this we will decide if we need to change her medications Flu shot today.  Meanwhile continue on Symbicort 2 puffs twice daily. Use Combivent 2 puffs every 6 hours as needed for breakthrough symptoms. Reviewed MDI technique

## 2018-09-13 NOTE — Assessment & Plan Note (Signed)
  Rheumatology consultation for steroid sparing agents

## 2018-09-13 NOTE — Patient Instructions (Addendum)
  Schedule PFTs-based on this we will decide if we need to change her medications Flu shot today.  Rheumatology consultation for rheumatoid arthritis.  Referral to lung cancer screening program  Prescription for Wellbutrin 150 mg twice daily, this should help you cut down on cigarettes

## 2018-09-13 NOTE — Progress Notes (Signed)
Subjective:    Patient ID: Drew Nguyen, male    DOB: 1956/02/28, 63 y.o.   MRN: [id number]  HPI   Chief Complaint  Patient presents with  . Pulm Consult    Referred by Volney Presser NP for COPD. States he has occasional SOB.     63 year old heavy smoker presents to establish care for management of COPD. He smokes a pack and 1/2/day, more than 60 pack years. He has longstanding rheumatoid arthritis and has been maintained on steroids for at least more than 5 years and is also on methotrexate.  He has been unable to get a rheumatology consultation since he did not have insurance for many years until he finally got Medicaid.  He reports dyspnea on exertion after walking short distances. He reports a daily a.m. cough with minimal white sputum production. Chest x-ray was obtained by PCP which was reportedly clear, I do not have this x-ray to review.  He inquires about possibility of lung cancer. He has been unable to quit.  In the remote past, his PCP gave him Valium with which she was able to decrease to 4 cigarettes/day.  He denies frequent chest colds. 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  I went through all his inhalers and reviewed his MDI technique which appears to be okay. His biggest problem appears to be his severe crippling arthritis     Past Medical History:  Diagnosis Date  . Hypertension    Past Surgical History:  Procedure Laterality Date  . ANKLE SURGERY      No Known Allergies  Social History   Socioeconomic History  . Marital status: Single    Spouse name: Not on file  . Number of children: Not on file  . Years of education: Not on file  . Highest education level: Not on file  Occupational History  . Not on file  Social Needs  . Financial resource strain: Not on file  . Food insecurity:    Worry: Not on file    Inability: Not on file  . Transportation needs:    Medical: Not on file      Non-medical: Not on file  Tobacco Use  . Smoking status: Current Every Day Smoker  Substance and Sexual Activity  . Alcohol use: Yes    Comment: occ  . Drug use: No  . Sexual activity: Not on file  Lifestyle  . Physical activity:    Days per week: Not on file    Minutes per session: Not on file  . Stress: Not on file  Relationships  . Social connections:    Talks on phone: Not on file    Gets together: Not on file    Attends religious service: Not on file    Active member of club or organization: Not on file    Attends meetings of clubs or organizations: Not on file    Relationship status: Not on file  . Intimate partner violence:    Fear of current or ex partner: Not on file    Emotionally abused: Not on file    Physically abused: Not on file    Forced sexual activity: Not on file  Other Topics Concern  . Not on file  Social History Narrative  . Not on file     History reviewed. No pertinent family history.    Review of Systems Constitutional: negative for anorexia, fevers and sweats  Eyes: negative for irritation, redness and visual disturbance  Ears, nose, mouth, throat, and face: negative for earaches, epistaxis, nasal congestion and sore throat  Respiratory: negative for  sputum and wheezing  Cardiovascular: negative for chest pain,lower extremity edema, orthopnea, palpitations and syncope  Gastrointestinal: negative for abdominal pain, constipation, diarrhea, melena, nausea and vomiting  Genitourinary:negative for dysuria, frequency and hematuria  Hematologic/lymphatic: negative for bleeding, easy bruising and lymphadenopathy  Musculoskeletal:positive for arthralgias, muscle weakness and stiff joints  Neurological: negative for coordination problems, , headaches and weakness  Endocrine: negative for diabetic symptoms including polydipsia, polyuria and weight loss     Objective:   Physical Exam  Gen. Pleasant, well-nourished, in no distress, normal  affect ENT - no pallor,icterus, no post nasal drip Neck: No JVD, no thyromegaly, no carotid bruits Lungs: hunch, no use of accessory muscles, no dullness to percussion, clear without rales or rhonchi  Cardiovascular: Rhythm regular, heart sounds  normal, no murmurs or gallops, no peripheral edema Abdomen: soft and non-tender, no hepatosplenomegaly, BS normal. Musculoskeletal:  Swollen PIP joints, no cyanosis or clubbing Neuro:  alert, non focal       Assessment & Plan:

## 2018-09-13 NOTE — Assessment & Plan Note (Signed)
  Referral to lung cancer screening program  Prescription for Wellbutrin 150 mg twice daily, this should help you cut down on cigarettes

## 2018-10-03 ENCOUNTER — Other Ambulatory Visit: Payer: Self-pay | Admitting: Acute Care

## 2018-10-03 DIAGNOSIS — F17210 Nicotine dependence, cigarettes, uncomplicated: Principal | ICD-10-CM

## 2018-10-03 DIAGNOSIS — Z122 Encounter for screening for malignant neoplasm of respiratory organs: Secondary | ICD-10-CM

## 2018-10-24 NOTE — Progress Notes (Signed)
_0  ID: Drew Nguyen, male    DOB: [birth year], 63 y.o.   MRN: [id number]  Chief Complaint  Patient presents with  . Follow-up    'breathing about the same'     Referring provider: No ref. provider found  HPI:  63 year old male current every day smoker initially referred to our office for evaluation of COPD  PMH: Rheumatoid arthritis Smoker/ Smoking History: Current smoker.  1.5 packs/day.  72-pack-year smoking history. Maintenance: Symbicort 160 Pt of: Dr. Elsworth Soho  10/25/2018  - Visit   63 year old male current every day smoker presenting to our office today for pulmonary function testing.  Pulmonary function test results listed below.  10/25/2018-pulmonary function test-FVC 4.18 (101% predicted), postbronchodilator ratio 64, postbronchodilator FEV1 2.73 (88% predicted), slight mid flow reversibility after bronchodilator.  No bronchodilator response, DLCO 82  Patient reporting today that he continues to have struggles swallowing pills.  Patient also reports that he has stopped methotrexate he reports he has contacted rheumatology and that they are aware that he has stopped methotrexate.  Patient continues to take his prednisone.  Patient feels that the methotrexate was "making him feel weird".  Patient has yet to see rheumatology for an office visit.  Per chart review patient has scheduled office visits with rheumatology in April/2020.  Patient reports adherence to his Symbicort 160.  Patient has received his flu vaccine this year but has never received a pneumonia vaccine.  Patient continues to smoke 1.5 packs/day.  Patient reports that sometimes this is even more than 1/2 packs/day.  Patient attributes this to his anxiety and how he copes with stress.  He has tried a multitude of smoking sensation therapies before: Nicotine replacement therapies, currently maintained on Wellbutrin, etc.  Patient reports the Wellbutrin has not helped at all since last office visit with curving cravings.   Patient reports the only medication that has helped in the past was when he was prescribed Valium for smoking sensation.  Patient is not willing to stop smoking at this time.  Patient is unwilling to try any smoking cessation therapies at this time.  MMRC - Breathlessness Score 3 - I stop for breath after walking about 100 yards or after a few minutes on level ground (isle at grocery store is 171f)  Of note, patient worked in [employer].  Patient also worked in [employer]   Tests:   Chart review >>> Patient has scheduled lung cancer screening CT in March/2020  10/25/2018-pulmonary function test-FVC 4.18 (101% predicted), postbronchodilator ratio 64, postbronchodilator FEV1 2.73 (88% predicted), slight mid flow reversibility after bronchodilator.  No bronchodilator response, DLCO 82   FENO:  No results found for: NITRICOXIDE  PFT: PFT Results Latest Ref Rng & Units 10/25/2018  FVC-Pre L 4.18  FVC-Predicted Pre % 101  FVC-Post L 4.28  FVC-Predicted Post % 104  Pre FEV1/FVC % % 64  Post FEV1/FCV % % 64  FEV1-Pre L 2.69  FEV1-Predicted Pre % 87  FEV1-Post L 2.73  DLCO UNC% % 82  DLCO COR %Predicted % 75  TLC L 6.86  TLC % Predicted % 109  RV % Predicted % 130    Imaging: No results found.    Specialty Problems      Pulmonary Problems   COPD GOLD I, still smoking    10/25/2018-pulmonary function test-FVC 4.18 (101% predicted), postbronchodilator ratio 64, postbronchodilator FEV1 2.73 (88% predicted), slight mid flow reversibility after bronchodilator.  No bronchodilator response, DLCO 82  No Known Allergies  Immunization History  Administered Date(s) Administered  . Influenza,inj,Quad PF,6+ Mos 09/13/2018  . Pneumococcal Polysaccharide-23 10/25/2018   Needs Pnueumovax23 >>> will get today  Past Medical History:  Diagnosis Date  . Hypertension     Tobacco History: Social History   Tobacco Use  Smoking Status Current  Every Day Smoker  . Packs/day: 1.50  . Years: 48.00  . Pack years: 72.00  . Types: Cigarettes  Smokeless Tobacco Never Used  Tobacco Comment   10/25/18 1 to 1 1/2 ppd   Ready to quit: No Counseling given: Yes Comment: 10/25/18 1 to 1 1/2 ppd  Smoking assessment and cessation counseling  Patient currently smoking: 1.5 ppd I have advised the patient to quit/stop smoking as soon as possible due to high risk for multiple medical problems.  It will also be very difficult for [place] to manage patient's  respiratory symptoms and status if we continue to expose her lungs to a known irritant.  We do not advise e-cigarettes as a form of stopping smoking.  Patient is not willing to quit smoking.  I have advised the patient that we can assist and have options of nicotine replacement therapy, provided smoking cessation education today, provided smoking cessation counseling, and provided cessation resources. Pt unwilling to try any smoking cessation at this time. Pt reports he used valium in the past to curb smoking cravings. Currently taking wellbutrin but no changes in smoking cessation today. Tried patches before - got sick during them. Tried gum and lozenges and and neither helped.   Follow-up next office visit office visit for assessment of smoking cessation.  Smoking cessation counseling advised for: 8 min     Outpatient Encounter Medications as of 10/25/2018  Medication Sig  . buPROPion (WELLBUTRIN SR) 150 MG 12 hr tablet Take 1 tablet (150 mg total) by mouth 2 (two) times daily.  . meloxicam (MOBIC) 7.5 MG tablet TAKE 1 TABLET BY MOUTH 1 TO 2 TIMES DAILY  . predniSONE (DELTASONE) 10 MG tablet TAKE 1 TABLET BY MOUTH IN THE MORNING AND 1 2 (ONE HALF) IN THE EVENING  . PROAIR HFA 108 (90 Base) MCG/ACT inhaler TO INHALE 1 TO 2 PUFFS EVERY 4 TO 6 HOURS AS NEEDED FOR COUGH WHEEZE OR SHORTNESS OF BREATH  . tamsulosin (FLOMAX) 0.4 MG CAPS capsule TAKE 1 CAPSULE BY MOUTH ONCE DAILY FOR URINARY TRACT  .  [DISCONTINUED] folic acid (FOLVITE) 1 MG tablet Take 1 mg by mouth daily.  . [DISCONTINUED] methotrexate 2.5 MG tablet TAKE TWO TABLETS BY MOUTH THREE TIMES WEEKLY  . [DISCONTINUED] SYMBICORT 160-4.5 MCG/ACT inhaler Inhale 2 puffs into the lungs 2 (two) times daily.  Marland Kitchen umeclidinium-vilanterol (ANORO ELLIPTA) 62.5-25 MCG/INH AEPB Inhale 1 puff into the lungs daily.  . [DISCONTINUED] umeclidinium-vilanterol (ANORO ELLIPTA) 62.5-25 MCG/INH AEPB Inhale 1 puff into the lungs daily.   No facility-administered encounter medications on file as of 10/25/2018.      Review of Systems  Review of Systems  Constitutional: Positive for fatigue. Negative for activity change, chills, fever and unexpected weight change.  HENT: Negative for postnasal drip, rhinorrhea, sneezing and sore throat.   Eyes: Negative.   Respiratory: Positive for shortness of breath. Negative for cough, chest tightness and wheezing.   Cardiovascular: Negative for chest pain and palpitations.  Gastrointestinal: Negative for diarrhea, nausea and vomiting.       Patient reports chronic issues with swallowing, patient chokes on food as well as pills, patient has never had a swallow study  or been evaluated by GI  Endocrine: Negative.   Musculoskeletal: Negative.        No diagnosis of rheumatoid arthritis, pending appointment with rheumatology  Skin: Negative.   Neurological: Negative for dizziness and headaches.  Psychiatric/Behavioral: Negative.  Negative for dysphoric mood. The patient is not nervous/anxious.   All other systems reviewed and are negative.    Physical Exam  BP 126/72 (BP Location: Left Arm, Patient Position: Sitting, Cuff Size: Normal)   Pulse 82   Temp 97.9 F (36.6 C)   Ht 5' 6.5" (1.689 m)   Wt 139 lb (63 kg)   SpO2 100%   BMI 22.10 kg/m   Wt Readings from Last 5 Encounters:  10/25/18 139 lb (63 kg)  09/13/18 138 lb (62.6 kg)  02/02/15 156 lb (70.8 kg)     Physical Exam  Constitutional: He is  oriented to person, place, and time and well-developed, well-nourished, and in no distress. Vital signs are normal. No distress.  Frail elderly male  HENT:  Head: Normocephalic and atraumatic.  Right Ear: Hearing and external ear normal.  Left Ear: Hearing and external ear normal.  Nose: Mucosal edema and rhinorrhea present. Right sinus exhibits no maxillary sinus tenderness and no frontal sinus tenderness. Left sinus exhibits no maxillary sinus tenderness and no frontal sinus tenderness.  Mouth/Throat: Uvula is midline and oropharynx is clear and moist. He does not have dentures. Abnormal dentition. No oropharyngeal exudate.  Unable to visualize TMs with cerumen impaction bilaterally  Eyes: Pupils are equal, round, and reactive to light.  Neck: Normal range of motion. Neck supple.  Cardiovascular: Normal rate, regular rhythm and normal heart sounds.  Pulmonary/Chest: Effort normal and breath sounds normal. No accessory muscle usage. No respiratory distress. He has no decreased breath sounds. He has no wheezes. He has no rhonchi.  Abdominal: Soft. Bowel sounds are normal. There is no abdominal tenderness.  Musculoskeletal: Normal range of motion.  Lymphadenopathy:    He has no cervical adenopathy.  Neurological: He is alert and oriented to person, place, and time. Gait normal.  Skin: Skin is warm and dry. He is not diaphoretic. No erythema.  Psychiatric: Mood, memory, affect and judgment normal.  Nursing note and vitals reviewed.     Lab Results:  CBC    Component Value Date/Time   WBC 9.6 05/03/2010 2059   RBC 4.64 05/03/2010 2059   HGB 14.8 05/03/2010 2059   HCT 43.7 05/03/2010 2059   PLT 280 05/03/2010 2059   MCV 94.1 05/03/2010 2059   MCH 31.8 05/03/2010 2059   MCHC 33.8 05/03/2010 2059   RDW 13.1 05/03/2010 2059   LYMPHSABS 2.0 05/03/2010 2059   MONOABS 0.6 05/03/2010 2059   EOSABS 0.1 05/03/2010 2059   BASOSABS 0.0 05/03/2010 2059    BMET    Component Value  Date/Time   NA 137 05/03/2010 2059   K 3.6 05/03/2010 2059   CL 105 05/03/2010 2059   CO2 24 05/03/2010 2059   GLUCOSE 87 05/03/2010 2059   BUN 8 05/03/2010 2059   CREATININE 0.95 05/03/2010 2059   CALCIUM 9.3 05/03/2010 2059   GFRNONAA >60 05/03/2010 2059   GFRAA  05/03/2010 2059    >60        The eGFR has been calculated using the MDRD equation. This calculation has not been validated in all clinical situations. eGFR's persistently <60 mL/min signify possible Chronic Kidney Disease.    BNP No results found for: BNP  ProBNP No results found for:  PROBNP    Assessment & Plan:    COPD GOLD I, still smoking Assessment: March/2020 pulmonary function test showing COPD Gold 1 Maintained on Symbicort 160 Patient continues to smoke 1.5 packs/day Patient is unwilling to stop smoking Lungs clear to auscultation today Patient has pending lung cancer screening evaluation later on this month mMRC 3 today  Plan: Trial of Anoro Ellipta for management of COPD Stop Symbicort 160 today Keep scheduled follow-up for lung cancer screening later on this month Continue rescue inhaler as needed Be as active as possible Discussed extensively with patient need to stop smoking, patient declines, patient knows to report back to our office if he is interested in smoking sensation  Rheumatoid arthritis ([place]) Assessment: Patient has stopped methotrexate, he notified his primary care they are okay with this Patient maintained on prednisone 10 mg daily Patient has pending appointment with rheumatology in [place]: Keep scheduled follow-up with rheumatology   Tobacco abuse Assessment: Current every day smoker Smoking 1.5 packs/day 72-pack-year smoking history Patient received flu vaccine this year Patient is not willing to stop smoking Reviewed smoking sensation therapies with patient  Plan: Continue with planned lung cancer screening appointment later on this  month Continue Wellbutrin Follow-up with primary care regarding better management of your chronic anxiety Pneumovax 23 today We recommend that you stop smoking If you do decide to stop smoking please contact our office so we can help support you  Dysphagia Assessment: Patient reports trouble swallowing foods as well as pills Patient has 72-pack-year smoking history Patient reports this is been an ongoing issue with trouble swallowing Patient reports never being evaluated by GI or having a swallow study  Plan: We will refer to GI for further evaluation     Lauraine Rinne, NP 10/25/2018   This appointment was 45 min long with over 50% of the time in direct face-to-face patient care, assessment, plan of care, and follow-up.

## 2018-10-25 ENCOUNTER — Ambulatory Visit (INDEPENDENT_AMBULATORY_CARE_PROVIDER_SITE_OTHER): Payer: Medicaid Other | Admitting: Pulmonary Disease

## 2018-10-25 ENCOUNTER — Encounter: Payer: Self-pay | Admitting: Pulmonary Disease

## 2018-10-25 VITALS — BP 126/72 | HR 82 | Temp 97.9°F | Ht 66.5 in | Wt 139.0 lb

## 2018-10-25 DIAGNOSIS — Z720 Tobacco use: Secondary | ICD-10-CM

## 2018-10-25 DIAGNOSIS — M069 Rheumatoid arthritis, unspecified: Secondary | ICD-10-CM

## 2018-10-25 DIAGNOSIS — F17210 Nicotine dependence, cigarettes, uncomplicated: Secondary | ICD-10-CM

## 2018-10-25 DIAGNOSIS — J439 Emphysema, unspecified: Secondary | ICD-10-CM

## 2018-10-25 DIAGNOSIS — R1310 Dysphagia, unspecified: Secondary | ICD-10-CM

## 2018-10-25 DIAGNOSIS — J449 Chronic obstructive pulmonary disease, unspecified: Secondary | ICD-10-CM

## 2018-10-25 DIAGNOSIS — Z23 Encounter for immunization: Secondary | ICD-10-CM

## 2018-10-25 LAB — PULMONARY FUNCTION TEST
DL/VA % pred: 75 %
DL/VA: 3.21 ml/min/mmHg/L
DLCO UNC % PRED: 82 %
DLCO unc: 20.04 ml/min/mmHg
FEF 25-75 PRE: 1.39 L/s
FEF 25-75 Post: 1.61 L/sec
FEF2575-%CHANGE-POST: 16 %
FEF2575-%PRED-POST: 64 %
FEF2575-%PRED-PRE: 55 %
FEV1-%Change-Post: 1 %
FEV1-%Pred-Post: 88 %
FEV1-%Pred-Pre: 87 %
FEV1-Post: 2.73 L
FEV1-Pre: 2.69 L
FEV1FVC-%Change-Post: -1 %
FEV1FVC-%PRED-PRE: 85 %
FEV6-%CHANGE-POST: 3 %
FEV6-%Pred-Post: 107 %
FEV6-%Pred-Pre: 103 %
FEV6-Post: 4.19 L
FEV6-Pre: 4.04 L
FEV6FVC-%Change-Post: 1 %
FEV6FVC-%Pred-Post: 103 %
FEV6FVC-%Pred-Pre: 101 %
FVC-%Change-Post: 2 %
FVC-%Pred-Post: 104 %
FVC-%Pred-Pre: 101 %
FVC-Post: 4.28 L
FVC-Pre: 4.18 L
POST FEV1/FVC RATIO: 64 %
POST FEV6/FVC RATIO: 98 %
PRE FEV1/FVC RATIO: 64 %
Pre FEV6/FVC Ratio: 97 %
RV % pred: 130 %
RV: 2.76 L
TLC % pred: 109 %
TLC: 6.86 L

## 2018-10-25 MED ORDER — UMECLIDINIUM-VILANTEROL 62.5-25 MCG/INH IN AEPB: 1.0000 | INHALATION_SPRAY | Freq: Every day | RESPIRATORY_TRACT | 5 refills | Status: DC

## 2018-10-25 MED ORDER — UMECLIDINIUM-VILANTEROL 62.5-25 MCG/INH IN AEPB: 1.0000 | INHALATION_SPRAY | Freq: Every day | RESPIRATORY_TRACT | 0 refills | Status: DC

## 2018-10-25 NOTE — Assessment & Plan Note (Signed)
Assessment: Patient has stopped methotrexate, he notified his primary care they are okay with this Patient maintained on prednisone 10 mg daily Patient has pending appointment with rheumatology in [place]: Keep scheduled follow-up with rheumatology

## 2018-10-25 NOTE — Assessment & Plan Note (Signed)
Assessment: March/2020 pulmonary function test showing COPD Gold 1 Maintained on Symbicort 160 Patient continues to smoke 1.5 packs/day Patient is unwilling to stop smoking Lungs clear to auscultation today Patient has pending lung cancer screening evaluation later on this month mMRC 3 today  Plan: Trial of Anoro Ellipta for management of COPD Stop Symbicort 160 today Keep scheduled follow-up for lung cancer screening later on this month Continue rescue inhaler as needed Be as active as possible Discussed extensively with patient need to stop smoking, patient declines, patient knows to report back to our office if he is interested in smoking sensation

## 2018-10-25 NOTE — Progress Notes (Signed)
Patient seen in the office today and instructed on use of anoro ellipta.  Patient expressed understanding and demonstrated technique.

## 2018-10-25 NOTE — Assessment & Plan Note (Signed)
Assessment: Patient reports trouble swallowing foods as well as pills Patient has 72-pack-year smoking history Patient reports this is been an ongoing issue with trouble swallowing Patient reports never being evaluated by GI or having a swallow study  Plan: We will refer to GI for further evaluation

## 2018-10-25 NOTE — Patient Instructions (Addendum)
Pneumovax23 today   Trial of Anoro Ellipta  >>> Take 1 puff daily in the morning right when you wake up >>>Rinse your mouth out after use >>>This is a daily maintenance inhaler, NOT a rescue inhaler >>>Contact our office if you are having difficulties affording or obtaining this medication >>>It is important for you to be able to take this daily and not miss any doses >>>this replaces symbicort 160 >>>Contact our office in 7 days (11/01/2018) to let us know how you are doing on the Anoro Ellipta Sample  Trouble swallowing and smoking history >>>referral to gastoenterology   We recommend that you stop smoking.  >>>You need to set a quit date >>>If you have friends or family who smoke, let them know you are trying to quit and not to smoke around you or in your living environment  Smoking Cessation Resources:  1 800 QUIT NOW  >>> Patient to call this resource and utilize it to help support her quit smoking >>> Keep up your hard work with stopping smoking  You can also contact the [facility] >>>For smoking cessation classes call [phone]  We do not recommend using e-cigarettes as a form of stopping smoking  You can sign up for smoking cessation support texts and information:  >>>https://smokefree.gov/smokefreetxt   Keep scheduled appt with Eric Form NP for Lung Cancer Screening >>>This is based off of your 72 pack-year smoking history >>> This is a recommendation from the [place] preventative services task force (USPSTF) >>>The USPSTF recommends annual screening for lung cancer with low-dose computed tomography (LDCT) in adults aged 54 to 80 years who have a 30 pack-year smoking history and currently smoke or have quit within the past 15 years. Screening should be discontinued once a person has not smoked for 15 years or develops a health problem that substantially limits life expectancy or the ability or willingness to have curative lung surgery.    After completing  this meeting with Eric Form NP you will get a low-dose CT as the screening >>>We will call you with those results      Keep follow up with Rheumatology appt in April/2020   Follow up with Dr. Elsworth Soho in 8 weeks   It is flu season:   >>> Best ways to protect herself from the flu: Receive the yearly flu vaccine, practice good hand hygiene washing with soap and also using hand sanitizer when available, eat a nutritious meals, get adequate rest, hydrate appropriately   Please contact the office if your symptoms worsen or you have concerns that you are not improving.   Thank you for choosing [facility] for your healthcare, and for allowing [place] to partner with you on your healthcare journey. I am thankful to be able to provide care to you today.   Wyn Quaker FNP-C   Steps to Quit Smoking  Smoking tobacco can be bad for your health. It can also affect almost every organ in your body. Smoking puts you and people around you at risk for many serious long-lasting (chronic) diseases. Quitting smoking is hard, but it is one of the best things that you can do for your health. It is never too late to quit. What are the benefits of quitting smoking? When you quit smoking, you lower your risk for getting serious diseases and conditions. They can include:  Lung cancer or lung disease.  Heart disease.  Stroke.  Heart attack.  Not being able to have children (infertility).  Weak bones (osteoporosis)  and broken bones (fractures). If you have coughing, wheezing, and shortness of breath, those symptoms may get better when you quit. You may also get sick less often. If you are pregnant, quitting smoking can help to lower your chances of having a baby of low birth weight. What can I do to help me quit smoking? Talk with your doctor about what can help you quit smoking. Some things you can do (strategies) include:  Quitting smoking totally, instead of slowly cutting back how much you  smoke over a period of time.  Going to in-person counseling. You are more likely to quit if you go to many counseling sessions.  Using resources and support systems, such as: ? Database administrator with a Social worker. ? Phone quitlines. ? Careers information officer. ? Support groups or group counseling. ? Text messaging programs. ? Mobile phone apps or applications.  Taking medicines. Some of these medicines may have nicotine in them. If you are pregnant or breastfeeding, do not take any medicines to quit smoking unless your doctor says it is okay. Talk with your doctor about counseling or other things that can help you. Talk with your doctor about using more than one strategy at the same time, such as taking medicines while you are also going to in-person counseling. This can help make quitting easier. What things can I do to make it easier to quit? Quitting smoking might feel very hard at first, but there is a lot that you can do to make it easier. Take these steps:  Talk to your family and friends. Ask them to support and encourage you.  Call phone quitlines, reach out to support groups, or work with a Social worker.  Ask people who smoke to not smoke around you.  Avoid places that make you want (trigger) to smoke, such as: ? Bars. ? Parties. ? Smoke-break areas at work.  Spend time with people who do not smoke.  Lower the stress in your life. Stress can make you want to smoke. Try these things to help your stress: ? Getting regular exercise. ? Deep-breathing exercises. ? Yoga. ? Meditating. ? Doing a body scan. To do this, close your eyes, focus on one area of your body at a time from head to toe, and notice which parts of your body are tense. Try to relax the muscles in those areas.  Download or buy apps on your mobile phone or tablet that can help you stick to your quit plan. There are many free apps, such as QuitGuide from the [employer] Office manager for Disease Control and Prevention). You can  find more support from smokefree.gov and other websites. This information is not intended to replace advice given to you by your health care provider. Make sure you discuss any questions you have with your health care provider. Document Released: 06/03/2009 Document Revised: 04/04/2016 Document Reviewed: 12/22/2014 Elsevier Interactive Patient Education  2019 [place] Risks of Smoking Smoking cigarettes is very bad for your health. Tobacco smoke has over 200 known poisons in it. It contains the poisonous gases nitrogen oxide and carbon monoxide. There are over 60 chemicals in tobacco smoke that cause cancer. Smoking is difficult to quit because a chemical in tobacco, called nicotine, causes addiction or dependence. When you smoke and inhale, nicotine is absorbed rapidly into the bloodstream through your lungs. Both inhaled and non-inhaled nicotine may be addictive. What are the risks of cigarette smoke? Cigarette smokers have an increased risk of many serious medical problems,  including:  Lung cancer.  Lung disease, such as pneumonia, bronchitis, and emphysema.  Chest pain (angina) and heart attack because the heart is not getting enough oxygen.  Heart disease and peripheral blood vessel disease.  High blood pressure (hypertension).  Stroke.  Oral cancer, including cancer of the lip, mouth, or voice box.  Bladder cancer.  Pancreatic cancer.  Cervical cancer.  Pregnancy complications, including premature birth.  Stillbirths and smaller newborn babies, birth defects, and genetic damage to sperm.  Early menopause.  Lower estrogen level for women.  Infertility.  Facial wrinkles.  Blindness.  Increased risk of broken bones (fractures).  Senile dementia.  Stomach ulcers and internal bleeding.  Delayed wound healing and increased risk of complications during surgery.  Even smoking lightly shortens your life expectancy by several years. Because of  secondhand smoke exposure, children of smokers have an increased risk of the following:  Sudden infant death syndrome (SIDS).  Respiratory infections.  Lung cancer.  Heart disease.  Ear infections. What are the benefits of quitting? There are many health benefits of quitting smoking. Here are some of them:  Within days of quitting smoking, your risk of having a heart attack decreases, your blood flow improves, and your lung capacity improves. Blood pressure, pulse rate, and breathing patterns start returning to normal soon after quitting.  Within months, your lungs may clear up completely.  Quitting for 10 years reduces your risk of developing lung cancer and heart disease to almost that of a nonsmoker.  People who quit may see an improvement in their overall quality of life. How do I quit smoking?     Smoking is an addiction with both physical and psychological effects, and longtime habits can be hard to change. Your health care provider can recommend:  Programs and community resources, which may include group support, education, or talk therapy.  Prescription medicines to help reduce cravings.  Nicotine replacement products, such as patches, gum, and nasal sprays. Use these products only as directed. Do not replace cigarette smoking with electronic cigarettes, which are commonly called e-cigarettes. The safety of e-cigarettes is not known, and some may contain harmful chemicals.  A combination of two or more of these methods. Where to find more information  American Lung Association: www.lung.org  American Cancer Society: www.cancer.org Summary  Smoking cigarettes is very bad for your health. Cigarette smokers have an increased risk of many serious medical problems, including several cancers, heart disease, and stroke.  Smoking is an addiction with both physical and psychological effects, and longtime habits can be hard to change.  By stopping right away, you can  greatly reduce the risk of medical problems for you and your family.  To help you quit smoking, your health care provider can recommend programs, community resources, prescription medicines, and nicotine replacement products such as patches, gum, and nasal sprays. This information is not intended to replace advice given to you by your health care provider. Make sure you discuss any questions you have with your health care provider. Document Released: 09/14/2004 Document Revised: 11/08/2017 Document Reviewed: 08/11/2016 Elsevier Interactive Patient Education  2019 [place].   Pneumococcal Vaccine, Polyvalent solution for injection What is this medicine? PNEUMOCOCCAL VACCINE, POLYVALENT (NEU mo KOK al vak SEEN, pol ee VEY luhnt) is a vaccine to prevent pneumococcus bacteria infection. These bacteria are a major cause of ear infections, Strep throat infections, and serious pneumonia, meningitis, or blood infections worldwide. These vaccines help the body to produce antibodies (protective substances) that help your  body defend against these bacteria. This vaccine is recommended for people 4 years of age and older with health problems. It is also recommended for all adults over 10 years old. This vaccine will not treat an infection. This medicine may be used for other purposes; ask your health care provider or pharmacist if you have questions. COMMON BRAND NAME(S): Pneumovax 23 What should I tell my health care provider before I take this medicine? They need to know if you have any of these conditions: -bleeding problems -bone marrow or organ transplant -cancer, Hodgkin's disease -fever -infection -immune system problems -low platelet count in the blood -seizures -an unusual or allergic reaction to pneumococcal vaccine, diphtheria toxoid, other vaccines, latex, other medicines, foods, dyes, or preservatives -pregnant or trying to get pregnant -breast-feeding How should I use this  medicine? This vaccine is for injection into a muscle or under the skin. It is given by a health care professional. A copy of Vaccine Information Statements will be given before each vaccination. Read this sheet carefully each time. The sheet may change frequently. Talk to your pediatrician regarding the use of this medicine in children. While this drug may be prescribed for children as young as 60 years of age for selected conditions, precautions do apply. Overdosage: If you think you have taken too much of this medicine contact a poison control center or emergency room at once. NOTE: This medicine is only for you. Do not share this medicine with others. What if I miss a dose? It is important not to miss your dose. Call your doctor or health care professional if you are unable to keep an appointment. What may interact with this medicine? -medicines for cancer chemotherapy -medicines that suppress your immune function -medicines that treat or prevent blood clots like warfarin, enoxaparin, and dalteparin -steroid medicines like prednisone or cortisone This list may not describe all possible interactions. Give your health care provider a list of all the medicines, herbs, non-prescription drugs, or dietary supplements you use. Also tell them if you smoke, drink alcohol, or use illegal drugs. Some items may interact with your medicine. What should I watch for while using this medicine? Mild fever and pain should go away in 3 days or less. Report any unusual symptoms to your doctor or health care professional. What side effects may I notice from receiving this medicine? Side effects that you should report to your doctor or health care professional as soon as possible: -allergic reactions like skin rash, itching or hives, swelling of the face, lips, or tongue -breathing problems -confused -fever over 102 degrees F -pain, tingling, numbness in the hands or feet -seizures -unusual bleeding or  bruising -unusual muscle weakness Side effects that usually do not require medical attention (report to your doctor or health care professional if they continue or are bothersome): -aches and pains -diarrhea -fever of 102 degrees F or less -headache -irritable -loss of appetite -pain, tender at site where injected -trouble sleeping This list may not describe all possible side effects. Call your doctor for medical advice about side effects. You may report side effects to FDA at 1-800-FDA-1088. Where should I keep my medicine? This does not apply. This vaccine is given in a clinic, pharmacy, doctor's office, or other health care setting and will not be stored at home. NOTE: This sheet is a summary. It may not cover all possible information. If you have questions about this medicine, talk to your doctor, pharmacist, or health care provider.  2019 Elsevier/Gold Standard (  2008-03-13 14:32:37)  

## 2018-10-25 NOTE — Progress Notes (Signed)
Full PFT performed today. °

## 2018-10-25 NOTE — Assessment & Plan Note (Addendum)
Assessment: Current every day smoker Smoking 1.5 packs/day 72-pack-year smoking history Patient received flu vaccine this year Patient is not willing to stop smoking Reviewed smoking sensation therapies with patient  Plan: Continue with planned lung cancer screening appointment later on this month Continue Wellbutrin Follow-up with primary care regarding better management of your chronic anxiety Pneumovax 23 today We recommend that you stop smoking If you do decide to stop smoking please contact our office so we can help support you

## 2018-11-01 ENCOUNTER — Telehealth: Payer: Self-pay | Admitting: Pulmonary Disease

## 2018-11-01 NOTE — Telephone Encounter (Signed)
Medication name and strength: Anoro Provider: Tyler Aas Pharmacy: [facility] Patient insurance ID: [id number] L Phone: [phone] ([place])  Was the PA started on CMM?  Yes If yes, please enter the Key: [id number] Timeframe for approval/denial: 24 hours through [place] tracks  Preferred inhalers through Medicaid are- Spiriva handihaler Stiolto Atrovent Bevespi Combivent Atrovent nebs Duo nebs  Message routed to [facility], RN to follow up

## 2018-11-08 ENCOUNTER — Other Ambulatory Visit: Payer: Self-pay

## 2018-11-08 ENCOUNTER — Other Ambulatory Visit: Payer: Self-pay | Admitting: General Surgery

## 2018-11-08 DIAGNOSIS — J449 Chronic obstructive pulmonary disease, unspecified: Secondary | ICD-10-CM

## 2018-11-08 MED ORDER — GLYCOPYRROLATE-FORMOTEROL 9-4.8 MCG/ACT IN AERO: 2.0000 | INHALATION_SPRAY | Freq: Two times a day (BID) | RESPIRATORY_TRACT | 6 refills | Status: DC

## 2018-11-08 NOTE — Telephone Encounter (Signed)
LMTCB

## 2018-11-08 NOTE — Telephone Encounter (Signed)
Spoke with patient and let him know insurance would not cover the Anoro originally prescribed. Advised him a prescription for Charolotte Eke has been sent to the pharmacy and for him to call the pharmacy first to make sure the escript send is ready for pick up and he can try Good Rx if possible based on cost.

## 2018-11-08 NOTE — Telephone Encounter (Signed)
11/08/2018 1311  Unfortunately I am just not seeing this triage message.  As it was not routed to me.  We received in the mail today a denial from Medicaid for covering [place].  Based off documentation listed below.  We can try patient on Bevespi.  Bevespi Aerosphere inhaler >>>2 puffs daily twice a day (4 puffs total daily) >>>This is not a rescue inhaler >>>You take this daily no matter what 6 refills.  Please place the order.    Please try to explain to the patient over the phone how to use this inhaler.  It may be beneficial for the patient to call back once he has the inhaler in his hand and to be coached via the phone or telephone encounter/visit on how to use his Bevespi.  Wyn Quaker, FNP

## 2018-11-11 ENCOUNTER — Telehealth: Payer: Self-pay

## 2018-11-11 NOTE — Telephone Encounter (Signed)
Tried to call patient to inform them that the Lung Cancer Screening for next Monday, March 30th has been cancelled. Was not able to leave message because the voicemail box was full.

## 2018-11-18 ENCOUNTER — Ambulatory Visit: Payer: Medicaid Other

## 2018-11-19 ENCOUNTER — Ambulatory Visit: Payer: Medicaid Other

## 2018-11-21 ENCOUNTER — Telehealth: Payer: Self-pay | Admitting: Pulmonary Disease

## 2018-11-22 NOTE — Telephone Encounter (Signed)
Noted thank you

## 2018-11-25 ENCOUNTER — Ambulatory Visit: Payer: Self-pay | Admitting: Rheumatology

## 2018-12-20 ENCOUNTER — Ambulatory Visit: Payer: Self-pay | Admitting: Rheumatology

## 2018-12-24 ENCOUNTER — Ambulatory Visit: Payer: Medicaid Other | Admitting: Pulmonary Disease

## 2019-01-07 ENCOUNTER — Telehealth: Payer: Self-pay

## 2019-01-08 ENCOUNTER — Encounter: Payer: Self-pay | Admitting: Gastroenterology

## 2019-01-08 ENCOUNTER — Other Ambulatory Visit: Payer: Self-pay

## 2019-01-08 ENCOUNTER — Ambulatory Visit (INDEPENDENT_AMBULATORY_CARE_PROVIDER_SITE_OTHER): Payer: Medicaid Other | Admitting: Gastroenterology

## 2019-01-08 ENCOUNTER — Telehealth: Payer: Self-pay

## 2019-01-08 VITALS — Ht 66.0 in | Wt 140.0 lb

## 2019-01-08 DIAGNOSIS — Z1211 Encounter for screening for malignant neoplasm of colon: Secondary | ICD-10-CM | POA: Diagnosis not present

## 2019-01-08 DIAGNOSIS — R1314 Dysphagia, pharyngoesophageal phase: Secondary | ICD-10-CM

## 2019-01-08 DIAGNOSIS — J449 Chronic obstructive pulmonary disease, unspecified: Secondary | ICD-10-CM | POA: Diagnosis not present

## 2019-01-08 NOTE — Telephone Encounter (Signed)
Drew Stabler, MD  [name], CMA        Please arrange EGD and colonoscopy in [place].  Dysphagia and colon cancer screening.  COPD, but meets LEC criteria. No oxygen and no blood thinners.    Attempted to schedule the patient for the recommended EGD/Colon. Patient informed me that he was to busy to schedule and was in the presence of company. Advised him to call back when he was ready to move forward to schedule. Contact info given to patients wife.

## 2019-01-08 NOTE — Telephone Encounter (Signed)
Covid-19 travel screening questions  Have you traveled in the last 14 days? NO If yes where?  Do you now or have you had a fever in the last 14 days? NO  Do you have any respiratory symptoms of shortness of breath or cough now or in the last 14 days? NO  Do you have a medical history of Congestive Heart Failure? NO  Do you have a medical history of lung disease? NO  Do you have any family members or close contacts with diagnosed or suspected Covid-19? NO        

## 2019-01-08 NOTE — Progress Notes (Signed)
This patient contacted our office requesting a physician telemedicine consultation regarding clinical questions and/or test results.  If new patient, they were referred by Wyn Quaker, NP [facility])  Participants on the conference : myself and patient and his daughter  The patient consented to this consultation and was aware that a charge will be placed through their insurance.  I was in my office and the patient was at home   Encounter time:  Total time 40 minutes, with 29 minutes spent with patient on [facility]   _____________________________________________________________________________________________              [facility] Consult Note:  History: Drew Nguyen 01/08/2019  Referring provider:  See above  Reason for consult/chief complaint: dysphagia   Subjective  HPI: Pulmonary clinic follow-up note of 10/25/2018 was reviewed.  Patient has COPD with continued tobacco abuse.  He reported to them years of difficulty swallowing pills, no prior GI evaluation.  Few yrs back had intermittent dysphagia.  Few months ago returned,persistent and worse than before. Appetite good, weight stable.  Feels pills and solid food feel stuck in neck and chest, may have to bring it back back up. Denies chronic abdominal pain. BM's regular without bleeding No prior CRC screening done  Has been on prednisone for years due to RA - lately unable to see new rheumatologist due to [place]. Hoarseness at least a year. Dry mouth and throat.   ROS:  Review of Systems  Constitutional: Negative for appetite change and unexpected weight change.  HENT: Positive for voice change. Negative for mouth sores.   Eyes: Negative for pain and redness.  Respiratory: Positive for shortness of breath. Negative for cough.   Cardiovascular: Negative for chest pain and palpitations.  Genitourinary: Negative for dysuria and hematuria.  Musculoskeletal: Positive for arthralgias. Negative  for myalgias.  Skin: Negative for pallor and rash.  Neurological: Negative for weakness and headaches.  Hematological: Negative for adenopathy.     Past Medical History: Past Medical History:  Diagnosis Date  . COPD (chronic obstructive pulmonary disease) ([place])   . Current smoker   . Depression   . Hypertension   . RA (rheumatoid arthritis) ([place])   . Tobacco abuse      Past Surgical History: Past Surgical History:  Procedure Laterality Date  . ANKLE SURGERY       Family History: Family History  Problem Relation Age of Onset  . Hypertension Mother   . Heart attack Mother   . Heart attack Father   . Brain cancer Brother   . Colon cancer Maternal Aunt   . Stomach cancer Neg Hx   . Esophageal cancer Neg Hx   . Pancreatic cancer Neg Hx     Social History: Social History   Socioeconomic History  . Marital status: Single    Spouse name: Not on file  . Number of children: Not on file  . Years of education: Not on file  . Highest education level: Not on file  Occupational History  . Not on file  Social Needs  . Financial resource strain: Not on file  . Food insecurity:    Worry: Not on file    Inability: Not on file  . Transportation needs:    Medical: Not on file    Non-medical: Not on file  Tobacco Use  . Smoking status: Current Every Day Smoker    Packs/day: 1.50    Years: 48.00    Pack years: 72.00    Types: Cigarettes  .  Smokeless tobacco: Never Used  . Tobacco comment: 10/25/18 1 to 1 1/2 ppd  Substance and Sexual Activity  . Alcohol use: Yes    Comment: occ  . Drug use: No  . Sexual activity: Not on file  Lifestyle  . Physical activity:    Days per week: Not on file    Minutes per session: Not on file  . Stress: Not on file  Relationships  . Social connections:    Talks on phone: Not on file    Gets together: Not on file    Attends religious service: Not on file    Active member of club or organization: Not on file    Attends meetings of  clubs or organizations: Not on file    Relationship status: Not on file  Other Topics Concern  . Not on file  Social History Narrative  . Not on file    Allergies: No Known Allergies  Outpatient Meds: Current Outpatient Medications  Medication Sig Dispense Refill  . acetaminophen (TYLENOL) 500 MG tablet Take 500 mg by mouth every 6 (six) hours as needed.    . citalopram (CELEXA) 10 MG tablet Take 10 mg by mouth daily.    . folic acid (FOLVITE) 1 MG tablet Take 1 mg by mouth daily.    . Glycopyrrolate-Formoterol (BEVESPI AEROSPHERE) 9-4.8 MCG/ACT AERO Inhale 2 puffs into the lungs 2 (two) times daily. This is not a rescue inhaler.  Take this daily no matter what. 10.7 g 6  . meloxicam (MOBIC) 7.5 MG tablet TAKE 1 TABLET BY MOUTH 1 TO 2 TIMES DAILY    . omeprazole (PRILOSEC) 20 MG capsule TAKE 1 CAPSULE BY MOUTH IN THE MORNING    . predniSONE (DELTASONE) 10 MG tablet TAKE 1 TABLET BY MOUTH IN THE MORNING AND 1 2 (ONE HALF) IN THE EVENING    . PROAIR HFA 108 (90 Base) MCG/ACT inhaler TO INHALE 1 TO 2 PUFFS EVERY 4 TO 6 HOURS AS NEEDED FOR COUGH WHEEZE OR SHORTNESS OF BREATH  2  . tamsulosin (FLOMAX) 0.4 MG CAPS capsule TAKE 1 CAPSULE BY MOUTH ONCE DAILY FOR URINARY TRACT  3  . TURMERIC PO Take by mouth.     No current facility-administered medications for this visit.     Does not take prilosec regularly, not sure if he should. Thinks it was Rx last year when reported these symptoms to one of his providers (was not having heartburn when it was Rx, and does not now)  ___________________________________________________________________ Objective   Exam:  Ht 5\' 6"  (1.676 m)   Wt 140 lb (63.5 kg)   BMI 22.60 kg/m    No exam - virtual visit  Alert, conversational, breathing comfortably on RA, raspy voice Has dentures  Labs:  CBC Latest Ref Rng & Units 05/03/2010 02/18/2010  WBC 4.0 - 10.5 K/uL 9.6 10.0  Hemoglobin 13.0 - 17.0 g/dL 14.8 14.9  Hematocrit 39.0 - 52.0 % 43.7 42.2   Platelets 150 - 400 K/uL 280 292   CMP Latest Ref Rng & Units 05/03/2010 02/18/2010  Glucose 70 - 99 mg/dL 87 98  BUN 6 - 23 mg/dL 8 18  Creatinine 0.4 - 1.5 mg/dL 0.95 1.11  Sodium 135 - 145 mEq/L 137 139  Potassium 3.5 - 5.1 mEq/L 3.6 3.7  Chloride 96 - 112 mEq/L 105 102  CO2 19 - 32 mEq/L 24 28  Calcium 8.4 - 10.5 mg/dL 9.3 9.6  Total Protein 6.0 - 8.3 g/dL 7.1 -  Total  Bilirubin 0.3 - 1.2 mg/dL 0.8 -  Alkaline Phos 39 - 117 U/L 86 -  AST 0 - 37 U/L 20 -  ALT 0 - 53 U/L 23 -   No prior swallow studies  Assessment: Encounter Diagnoses  Name Primary?  . Pharyngoesophageal dysphagia Yes  . Special screening for malignant neoplasms, colon   . COPD GOLD I, still smoking     Difficult to tell if mechanical or motility.But worsening lately. Needs colon cancer screening  Plan: Stop PPI - does not seem to need it  EGD and colonoscopy.  My office will contact him to make arrangements.  Procedures described in detail, along with risks and benefits.  The benefits and risks of the planned procedure were described in detail with the patient or (when appropriate) their health care proxy.  Risks were outlined as including, but not limited to, bleeding, infection, perforation, adverse medication reaction leading to cardiac or pulmonary decompensation.  The limitation of incomplete mucosal visualization was also discussed.  No guarantees or warranties were given.  His risks are increased due to COPD, prednisone and COVID.  However, dysphagia must be investigated.  Thank you for the courtesy of this consult.  Please call me with any questions or concerns.  Nelida Meuse III  CC: Referring provider noted above

## 2019-01-09 ENCOUNTER — Other Ambulatory Visit: Payer: Self-pay

## 2019-01-09 DIAGNOSIS — Z1211 Encounter for screening for malignant neoplasm of colon: Secondary | ICD-10-CM

## 2019-01-09 DIAGNOSIS — R1314 Dysphagia, pharyngoesophageal phase: Secondary | ICD-10-CM

## 2019-01-09 DIAGNOSIS — J4489 Other specified chronic obstructive pulmonary disease: Secondary | ICD-10-CM

## 2019-01-09 DIAGNOSIS — J449 Chronic obstructive pulmonary disease, unspecified: Secondary | ICD-10-CM

## 2019-01-28 ENCOUNTER — Telehealth: Payer: Self-pay | Admitting: Gastroenterology

## 2019-01-28 ENCOUNTER — Other Ambulatory Visit: Payer: Self-pay | Admitting: General Surgery

## 2019-01-28 MED ORDER — NA SULFATE-K SULFATE-MG SULF 17.5-3.13-1.6 GM/177ML PO SOLN: 1.0000 | Freq: Once | ORAL | 0 refills | Status: AC

## 2019-01-28 NOTE — Telephone Encounter (Signed)
Sent to [facility]

## 2019-01-28 NOTE — Telephone Encounter (Signed)
Pt is scheduled for EGD/col Thursday and requested prep soln sent to [facility] on [address].

## 2019-01-29 ENCOUNTER — Telehealth: Payer: Self-pay | Admitting: *Deleted

## 2019-01-29 NOTE — Telephone Encounter (Signed)

## 2019-01-30 ENCOUNTER — Ambulatory Visit (AMBULATORY_SURGERY_CENTER): Payer: Medicaid Other | Admitting: Gastroenterology

## 2019-01-30 ENCOUNTER — Encounter: Payer: Self-pay | Admitting: Gastroenterology

## 2019-01-30 ENCOUNTER — Other Ambulatory Visit: Payer: Self-pay

## 2019-01-30 VITALS — BP 160/100 | HR 68 | Temp 98.4°F | Resp 16 | Ht 66.0 in | Wt 140.0 lb

## 2019-01-30 DIAGNOSIS — B9681 Helicobacter pylori [H. pylori] as the cause of diseases classified elsewhere: Secondary | ICD-10-CM

## 2019-01-30 DIAGNOSIS — Z1211 Encounter for screening for malignant neoplasm of colon: Secondary | ICD-10-CM

## 2019-01-30 DIAGNOSIS — D122 Benign neoplasm of ascending colon: Secondary | ICD-10-CM

## 2019-01-30 DIAGNOSIS — K2970 Gastritis, unspecified, without bleeding: Secondary | ICD-10-CM

## 2019-01-30 DIAGNOSIS — R1314 Dysphagia, pharyngoesophageal phase: Secondary | ICD-10-CM

## 2019-01-30 DIAGNOSIS — D120 Benign neoplasm of cecum: Secondary | ICD-10-CM

## 2019-01-30 DIAGNOSIS — D123 Benign neoplasm of transverse colon: Secondary | ICD-10-CM

## 2019-01-30 MED ORDER — SODIUM CHLORIDE 0.9 % IV SOLN: 500.0000 mL | INTRAVENOUS | Status: DC

## 2019-01-30 MED ORDER — FLUCONAZOLE 100 MG PO TABS: 100.0000 mg | ORAL_TABLET | Freq: Every day | ORAL | 0 refills | Status: DC

## 2019-01-30 NOTE — Progress Notes (Signed)
Called to room to assist during endoscopic procedure.  Patient ID and intended procedure confirmed with present staff. Received instructions for my participation in the procedure from the performing physician.  

## 2019-01-30 NOTE — Progress Notes (Signed)
To PACU, VSS. Report to RN.tb 

## 2019-01-30 NOTE — Op Note (Signed)
[place] Patient Name: Drew Nguyen Procedure Date: 01/30/2019 1:33 PM MRN: [id number] Endoscopist: Mallie Mussel L. Loletha Carrow , MD Age: 63 Referring MD:  Date of Birth: 24-Jan-1956 Gender: Male Account #: [id number] Procedure:                Colonoscopy Indications:              Screening for colorectal malignant neoplasm, This                            is the patient's first colonoscopy Medicines:                Monitored Anesthesia Care Procedure:                Pre-Anesthesia Assessment:                           - Prior to the procedure, a History and Physical                            was performed, and patient medications and                            allergies were reviewed. The patient's tolerance of                            previous anesthesia was also reviewed. The risks                            and benefits of the procedure and the sedation                            options and risks were discussed with the patient.                            All questions were answered, and informed consent                            was obtained. Prior Anticoagulants: The patient has                            taken no previous anticoagulant or antiplatelet                            agents except for aspirin. ASA Grade Assessment:                            III - A patient with severe systemic disease. After                            reviewing the risks and benefits, the patient was                            deemed in satisfactory condition to undergo the  procedure.                           After obtaining informed consent, the colonoscope                            was passed under direct vision. Throughout the                            procedure, the patient's blood pressure, pulse, and                            oxygen saturations were monitored continuously. The                            Colonoscope was introduced through the anus and                     advanced to the the cecum, identified by                            appendiceal orifice and ileocecal valve. The                            colonoscopy was performed without difficulty. The                            patient tolerated the procedure well. The quality                            of the bowel preparation was good. Scope In: 1:48:20 PM Scope Out: 2:09:39 PM Scope Withdrawal Time: 0 hours 17 minutes 2 seconds  Total Procedure Duration: 0 hours 21 minutes 19 seconds  Findings:                 The perianal and digital rectal examinations were                            normal.                           A 2 mm polyp was found in the cecum. The polyp was                            sessile. The polyp was removed with a cold biopsy                            forceps. Resection and retrieval were complete.                           Two sessile polyps were found in the transverse                            colon and ascending colon. The polyps were 4-5 mm  in size. These polyps were removed with a cold                            snare. Resection and retrieval were complete.                           Diverticula were found in the left colon (with                            associated tortuosity).                           The exam was otherwise without abnormality on                            direct and retroflexion views. Complications:            No immediate complications. Estimated Blood Loss:     Estimated blood loss was minimal. Impression:               - One 2 mm polyp in the cecum, removed with a cold                            biopsy forceps. Resected and retrieved.                           - Two 4-5 mm polyps in the transverse colon and in                            the ascending colon, removed with a cold snare.                            Resected and retrieved.                           - Diverticulosis in the left colon.                            - The examination was otherwise normal on direct                            and retroflexion views. Recommendation:           - Patient has a contact number available for                            emergencies. The signs and symptoms of potential                            delayed complications were discussed with the                            patient. Return to normal activities tomorrow.  Written discharge instructions were provided to the                            patient.                           - Resume previous diet.                           - Continue present medications.                           - Await pathology results.                           - Repeat colonoscopy is recommended for                            surveillance. The colonoscopy date will be                            determined after pathology results from today's                            exam become available for review.                           - See the other procedure note for documentation of                            additional recommendations. Yasuko Lapage L. Loletha Carrow, MD 01/30/2019 2:34:06 PM This report has been signed electronically.

## 2019-01-30 NOTE — Patient Instructions (Signed)
Handout given:Polyps, and Diverticulosis  Expect a call from Dr Corena Pilgrim office regarding an ENT appointment.   YOU HAD AN ENDOSCOPIC PROCEDURE TODAY AT [facility]:   Refer to the procedure report that was given to you for any specific questions about what was found during the examination.  If the procedure report does not answer your questions, please call your gastroenterologist to clarify.  If you requested that your care partner not be given the details of your procedure findings, then the procedure report has been included in a sealed envelope for you to review at your convenience later.  YOU SHOULD EXPECT: Some feelings of bloating in the abdomen. Passage of more gas than usual.  Walking can help get rid of the air that was put into your GI tract during the procedure and reduce the bloating. If you had a lower endoscopy (such as a colonoscopy or flexible sigmoidoscopy) you may notice spotting of blood in your stool or on the toilet paper. If you underwent a bowel prep for your procedure, you may not have a normal bowel movement for a few days.  Please Note:  You might notice some irritation and congestion in your nose or some drainage.  This is from the oxygen used during your procedure.  There is no need for concern and it should clear up in a day or so.  SYMPTOMS TO REPORT IMMEDIATELY:   Following lower endoscopy (colonoscopy or flexible sigmoidoscopy):  Excessive amounts of blood in the stool  Significant tenderness or worsening of abdominal pains  Swelling of the abdomen that is new, acute  Fever of 100F or higher   Following upper endoscopy (EGD)  Vomiting of blood or coffee ground material  New chest pain or pain under the shoulder blades  Painful or persistently difficult swallowing  New shortness of breath  Fever of 100F or higher  Black, tarry-looking stools  For urgent or emergent issues, a gastroenterologist can be reached at any hour by calling [phone]  [phone].   DIET:  We do recommend a small meal at first, but then you may proceed to your regular diet.  Drink plenty of fluids but you should avoid alcoholic beverages for 24 hours.  ACTIVITY:  You should plan to take it easy for the rest of today and you should NOT DRIVE or use heavy machinery until tomorrow (because of the sedation medicines used during the test).    FOLLOW UP: Our staff will call the number listed on your records 48-72 hours following your procedure to check on you and address any questions or concerns that you may have regarding the information given to you following your procedure. If we do not reach you, we will leave a message.  We will attempt to reach you two times.  During this call, we will ask if you have developed any symptoms of COVID 19. If you develop any symptoms (ie: fever, flu-like symptoms, shortness of breath, cough etc.) before then, please call [phone].  If you test positive for Covid 19 in the 2 weeks post procedure, please call and report this information to [place].    If any biopsies were taken you will be contacted by phone or by letter within the next 1-3 weeks.  Please call us at [phone] if you have not heard about the biopsies in 3 weeks.    SIGNATURES/CONFIDENTIALITY: You and/or your care partner have signed paperwork which will be entered into your electronic medical record.  These signatures attest  to the fact that that the information above on your After Visit Summary has been reviewed and is understood.  Full responsibility of the confidentiality of this discharge information lies with you and/or your care-partner.

## 2019-01-30 NOTE — Op Note (Signed)
Drew Nguyen: Drew Nguyen Procedure Date: 01/30/2019 1:32 PM MRN: [id number] Endoscopist: Powell. Loletha Carrow , MD Age: 63 Referring MD:  Date of Birth: November 27, 1955 Gender: Male Account #: [id number] Procedure:                Upper GI endoscopy Indications:              Oropharyngeal phase dysphagia (smoker on chronic                            prednisone and multiple inhalers, including                            glycopyrrolate) Medicines:                Monitored Anesthesia Care Procedure:                Pre-Anesthesia Assessment:                           - Prior to the procedure, a History and Physical                            was performed, and patient medications and                            allergies were reviewed. The patient's tolerance of                            previous anesthesia was also reviewed. The risks                            and benefits of the procedure and the sedation                            options and risks were discussed with the patient.                            All questions were answered, and informed consent                            was obtained. Prior Anticoagulants: The patient has                            taken no previous anticoagulant or antiplatelet                            agents except for aspirin. ASA Grade Assessment:                            III - A patient with severe systemic disease. After                            reviewing the risks and benefits, the patient was  deemed in satisfactory condition to undergo the                            procedure.                           After obtaining informed consent, the endoscope was                            passed under direct vision. Throughout the                            procedure, the patient's blood pressure, pulse, and                            oxygen saturations were monitored continuously. The   Endoscope was introduced through the mouth, and                            advanced to the second part of duodenum. The upper                            GI endoscopy was accomplished without difficulty.                            The patient tolerated the procedure well. Scope In: Scope Out: Findings:                 Asymmetrical laryngeal edema was visualized, most                            prominently on the left false vocal cord. The edema                            is not obstructing the airway.                           Ritta Slot was found in the hypopharynx.                           Diffuse, white plaques were found in the entire                            esophagus. Biopsies were taken with a cold forceps                            from the distal esophagus for histology.                           The distal esophagus was moderately tortuous with                            lumenal narrowing. No mass was seen, and EGJ  appeared normal.                           Diffuse inflammation characterized by adherent                            blood, congestion (edema) and erythema was found in                            the entire examined stomach. Biopsies were taken                            with a cold forceps for histology. ([facility]                            protocol).                           The cardia and gastric fundus were normal on                            retroflexion.                           The examined duodenum was normal. Complications:            No immediate complications. Estimated Blood Loss:     Estimated blood loss was minimal. Impression:               - Laryngeal edema was found.                           - Ritta Slot was found in the oropharynx.                           - Esophageal plaques were found, consistent with                            candidiasis. Biopsied.                           - Tortuous esophagus.                            - Gastritis. Biopsied.                           - Normal examined duodenum. Recommendation:           - Patient has a contact number available for                            emergencies. The signs and symptoms of potential                            delayed complications were discussed with the  patient. Return to normal activities tomorrow.                            Written discharge instructions were provided to the                            patient.                           - Resume previous diet.                           - Continue present medications.                           - Await pathology results.                           - See the other procedure note for documentation of                            additional recommendations.                           - Refer to an ENT specialist at appointment to be                            scheduled.                           - Diflucan (fluconazole) 100 mg PO daily for 3                            weeks. Disp #21 RF zero Jayne Peckenpaugh L. Loletha Carrow, MD 01/30/2019 2:49:28 PM This report has been signed electronically.

## 2019-01-30 NOTE — Progress Notes (Signed)
Patient discharged, To restroom. Called bother with # provided, no answer, message left. Informed the patient who provided a # for daughter, Drew Nguyen daughter, brother and daughter are not in the parking lot as instructed. Daughter stating it would be 45 minutes before arrival. Patient in [place] #1in wheelchair. Given water to drink. Patient aware of circumstances.

## 2019-01-31 ENCOUNTER — Telehealth: Payer: Self-pay | Admitting: Gastroenterology

## 2019-01-31 DIAGNOSIS — J387 Other diseases of larynx: Secondary | ICD-10-CM

## 2019-01-31 NOTE — Telephone Encounter (Signed)
Referral faxed to [facility].  Patient notified he will be contacted directly with an appt.

## 2019-01-31 NOTE — Telephone Encounter (Signed)
Please send a referral to [facility] for this patient regarding laryngeal abnormality discovered on EGD in a smoker.  Please send my endoscopy report.

## 2019-02-03 ENCOUNTER — Other Ambulatory Visit: Payer: Self-pay

## 2019-02-03 ENCOUNTER — Telehealth: Payer: Self-pay | Admitting: *Deleted

## 2019-02-03 ENCOUNTER — Encounter: Payer: Self-pay | Admitting: Gastroenterology

## 2019-02-03 DIAGNOSIS — Q400 Congenital hypertrophic pyloric stenosis: Secondary | ICD-10-CM

## 2019-02-03 MED ORDER — METRONIDAZOLE 250 MG PO TABS: 250.0000 mg | ORAL_TABLET | Freq: Four times a day (QID) | ORAL | 0 refills | Status: AC

## 2019-02-03 MED ORDER — BISMUTH SUBSALICYLATE 262 MG PO CHEW: 524.0000 mg | CHEWABLE_TABLET | Freq: Four times a day (QID) | ORAL | Status: AC

## 2019-02-03 MED ORDER — OMEPRAZOLE 20 MG PO CPDR: 20.0000 mg | DELAYED_RELEASE_CAPSULE | Freq: Two times a day (BID) | ORAL | 0 refills | Status: DC

## 2019-02-03 MED ORDER — DOXYCYCLINE HYCLATE 100 MG PO CAPS: 100.0000 mg | ORAL_CAPSULE | Freq: Two times a day (BID) | ORAL | 0 refills | Status: AC

## 2019-02-03 NOTE — Telephone Encounter (Signed)
First attempt, mailbox full and unable to leave VM.

## 2019-02-03 NOTE — Telephone Encounter (Signed)
  Follow up Call-  Call back number 01/30/2019  Post procedure Call Back phone  # [phone]  Permission to leave phone message No  Some recent data might be hidden     Patient questions:  Do you have a fever, pain , or abdominal swelling? No. Pain Score  0 *  Have you tolerated food without any problems? Yes.    Have you been able to return to your normal activities? Yes.    Do you have any questions about your discharge instructions: Diet   No. Medications  No. Follow up visit  No.  Do you have questions or concerns about your Care? No.  Actions: * If pain score is 4 or above: No action needed, pain <4.  1. Have you developed a fever since your procedure? NO  2.   Have you had an respiratory symptoms (SOB or cough) since your procedure? NO  3.   Have you tested positive for COVID 19 since your procedure NO  4.   Have you had any family members/close contacts diagnosed with the COVID 19 since your procedure?  NO   If yes to any of these questions please route to Joylene Naksh, RN and Alphonsa Gin, RN.

## 2019-02-10 ENCOUNTER — Other Ambulatory Visit: Payer: Self-pay

## 2019-02-10 ENCOUNTER — Telehealth: Payer: Self-pay

## 2019-02-10 DIAGNOSIS — B9681 Helicobacter pylori [H. pylori] as the cause of diseases classified elsewhere: Secondary | ICD-10-CM

## 2019-02-10 DIAGNOSIS — K253 Acute gastric ulcer without hemorrhage or perforation: Secondary | ICD-10-CM

## 2019-02-10 NOTE — Telephone Encounter (Signed)
-----   Message from Hughie Closs, RN sent at 02/03/2019 11:58 AM EDT ----- Call patient and have him come in( or can mail) the form to take to [facility] for Urea Breath Test. (2-3 weeks after finishing antibiotic tx) = 6/25. I need to print form. Order in [place].

## 2019-02-10 NOTE — Telephone Encounter (Signed)
Called patient and asked him to get his Urea Breath Test done at [facility], to see if his H Pylori has cleared. Order in [place] and order printed and mailed to patient , per his request. Gave patient [facility] ph# so he could call and find one close to him

## 2019-03-03 ENCOUNTER — Other Ambulatory Visit: Payer: Self-pay

## 2019-03-03 ENCOUNTER — Other Ambulatory Visit: Payer: Medicaid Other | Admitting: Acute Care

## 2019-03-03 DIAGNOSIS — F17210 Nicotine dependence, cigarettes, uncomplicated: Secondary | ICD-10-CM

## 2019-03-03 NOTE — Progress Notes (Signed)
Shared Decision-Making Visit for Lung Cancer Screening [phone] )  Lung Cancer Screening Criteria 30-77-years of age 64+ pack year smoking history No Recent History of coughing up blood   No Unexplained weight loss of > 15 pounds in the last 6 months. No Prior History Lung / other cancer  (Diagnosis within the last 5 years already requiring surveillance chest CT Scans). Pt is a current smoker, or former smoker who has quit within the last 15 years.  This patient meets the criterial noted above and is asymptomatic for any signs or symptoms of lung cancer.  The Shared Decision-Making Visit discussion included risks and benefits of screening, potential for follow up diagnostic testing for abnormal scans, potential for false positive tests, over diagnosis, and discussion about total radiation exposure. Patient stated willingness to undergo diagnostics and treatment as needed. Current smokers were counseled on smoking cessation as the single most powerful action they can take to decrease their risk of lung cancer, pulmonary disease, heart disease and stroke. They were given a resource card with information on receiving free nicotine replacement therapy , and information about free smoking cessation classes.  Pt understands that this scan is being paid for by a grant obtained by the [facility]. We discussed  that there is no guarantee of additional grant money from year to year as these grants are not guaranteed to programs on an annual basis.They have to be applied for and they are awarded based on county need.    Pt was given Blue card #1 for imaging.   Magdalen Spatz, AGACNP-BC [place] Pager # [phone] 03/03/2019 6:09 PM

## 2019-03-04 ENCOUNTER — Ambulatory Visit
Admission: RE | Admit: 2019-03-04 | Discharge: 2019-03-04 | Disposition: A | Discharge status: Home / Self Care | Payer: No Typology Code available for payment source | Source: Ambulatory Visit | Attending: Acute Care | Admitting: Acute Care

## 2019-03-04 DIAGNOSIS — F17210 Nicotine dependence, cigarettes, uncomplicated: Secondary | ICD-10-CM

## 2019-03-04 DIAGNOSIS — Z122 Encounter for screening for malignant neoplasm of respiratory organs: Secondary | ICD-10-CM

## 2019-03-04 IMAGING — CT CT CHEST LUNG CANCER SCREENING LOW DOSE
2 of 5 series · 15 of 40 positions shown, 18 images · non-contrast
Comparison: None.

CLINICAL DATA: 62-year-old asymptomatic male current smoker with 96
pack-year smoking history.

EXAM:
CT CHEST WITHOUT CONTRAST LOW-DOSE FOR LUNG CANCER SCREENING
TECHNIQUE: Multidetector CT imaging of the chest was performed following the
standard protocol without IV contrast.

[Series 4: lung 1.00 br44 cor · coronal · 0.71mm/px · 3 of 303 slices shown]
[im 61/303  lung]
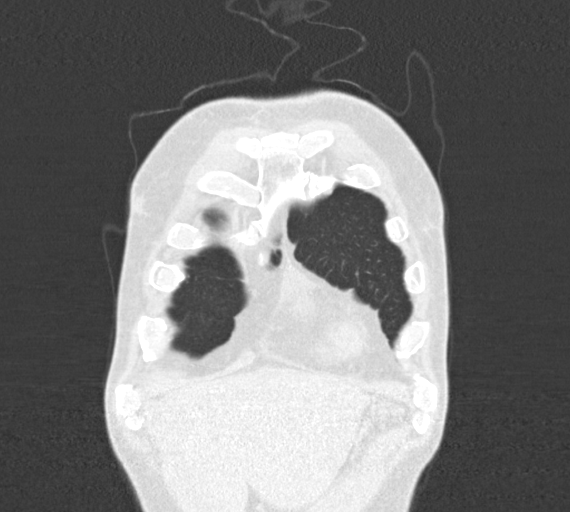
[im 121/303  lung]
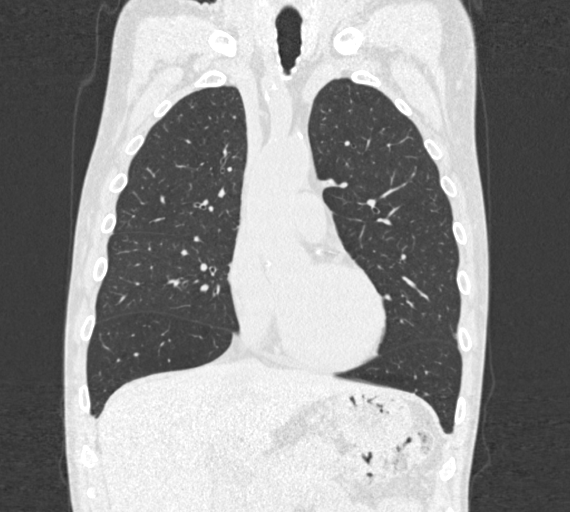
[im 182/303  lung]
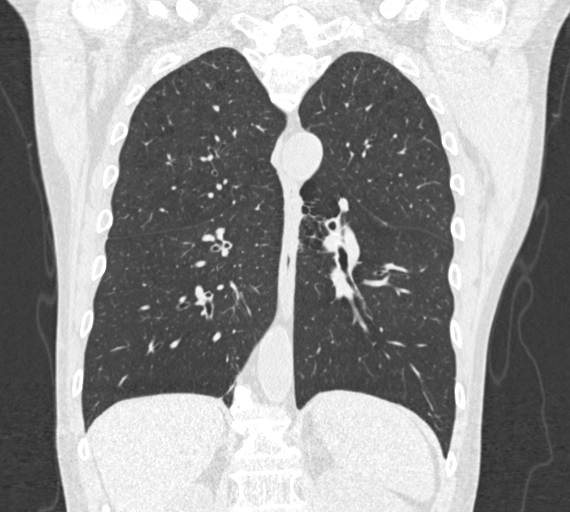

[Series 9: lung 1.00 br60 axial · axial · 0.68mm/px · z∈[-1203,-874]mm · 12 of 363 slices shown, 15 images]
[im 17/363  mediastinal]
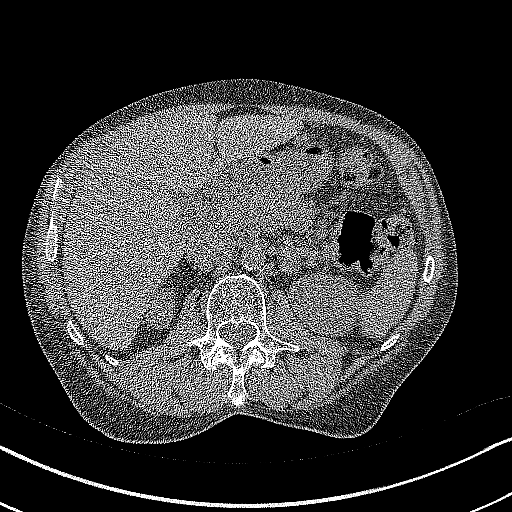
[im 17/363  lung]
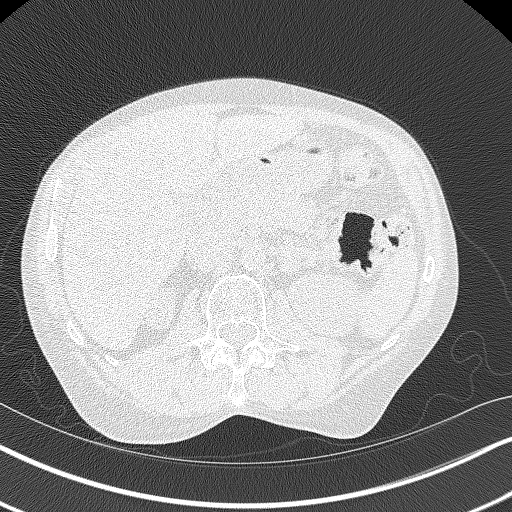
[im 50/363  lung]
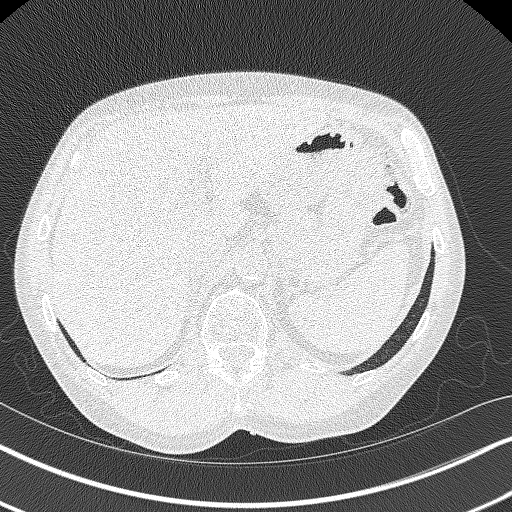
[im 83/363  lung]
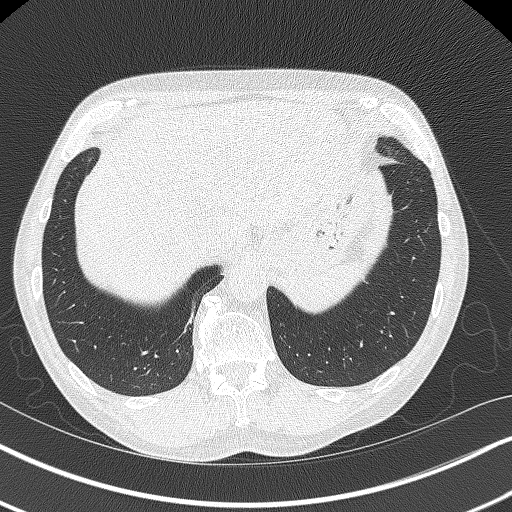
[im 116/363  lung]
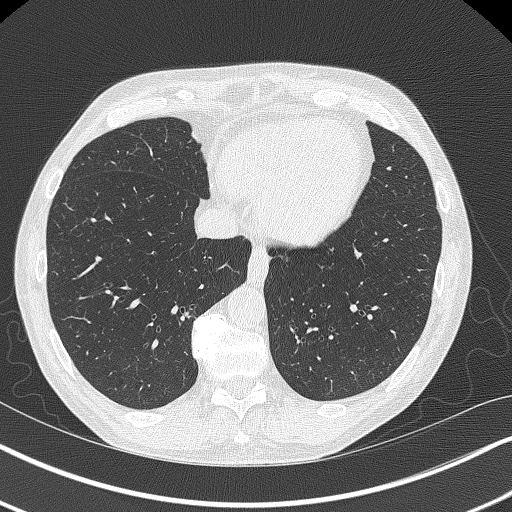
[im 132/363  mediastinal]
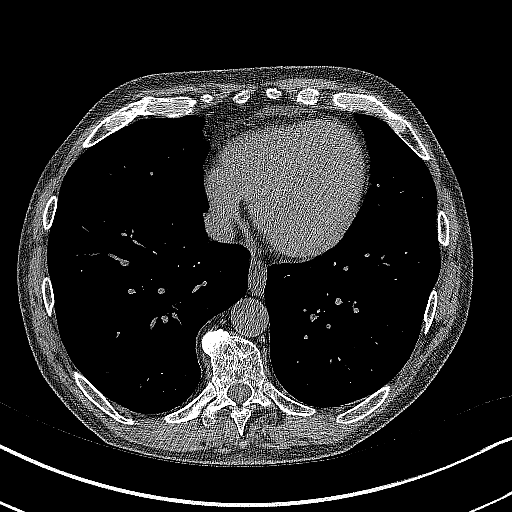
[im 132/363  lung]
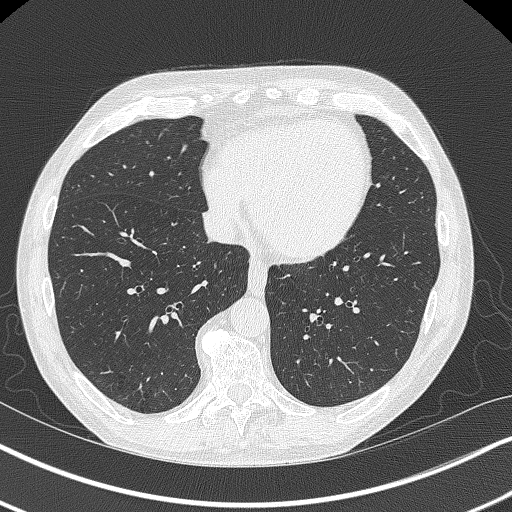
[im 165/363  lung]
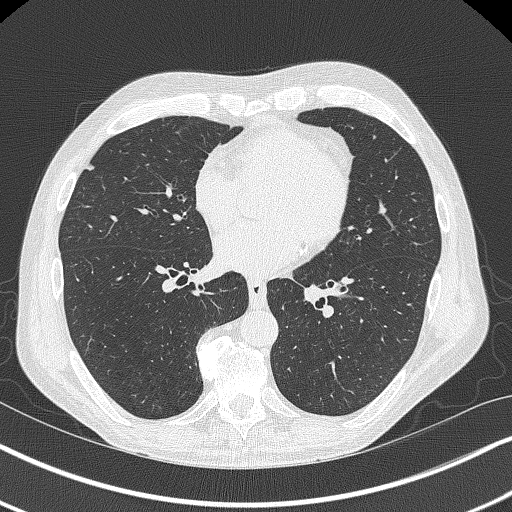
[im 198/363  lung]
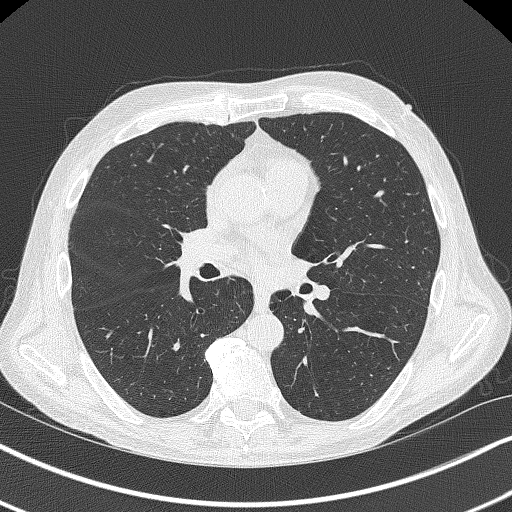
[im 231/363  lung]
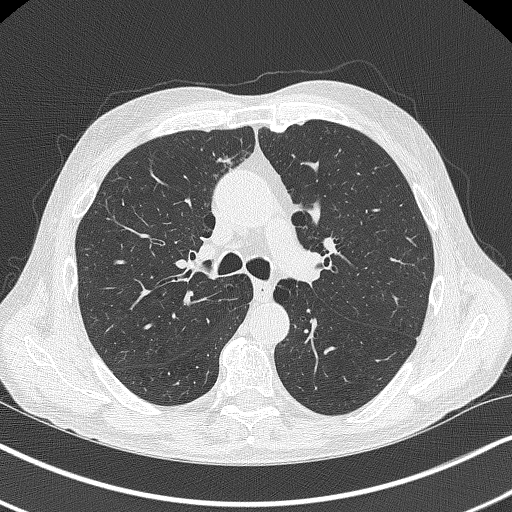
[im 247/363  mediastinal]
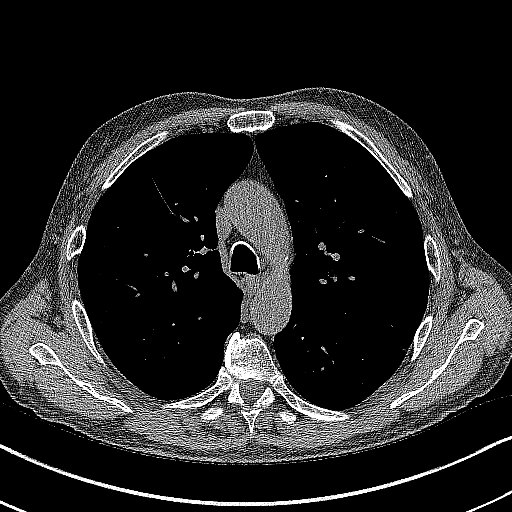
[im 247/363  lung]
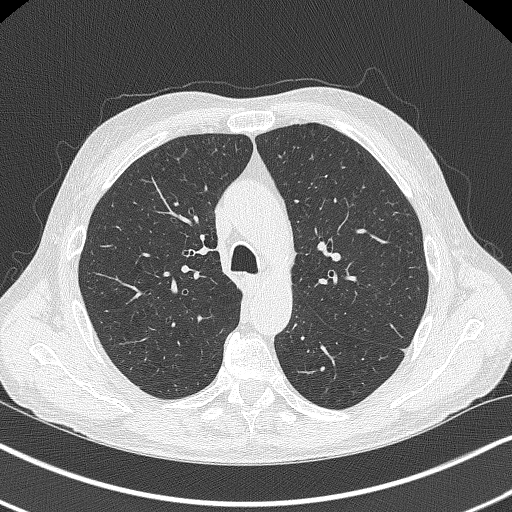
[im 280/363  lung]
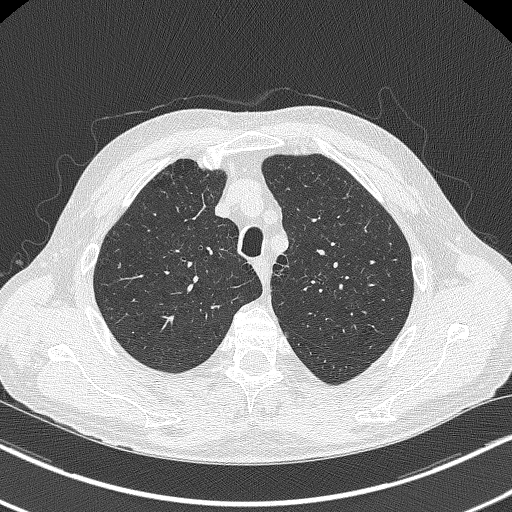
[im 313/363  lung]
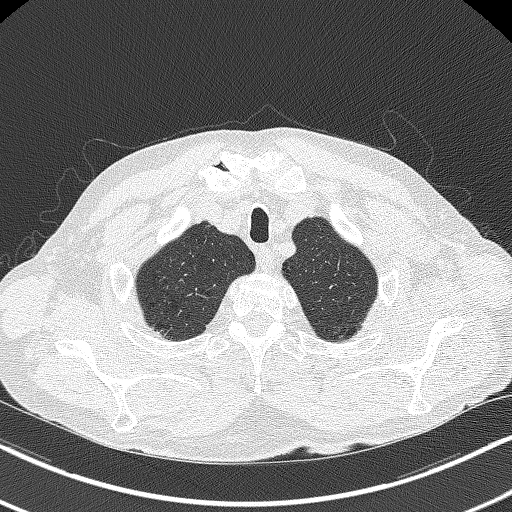
[im 346/363  lung]
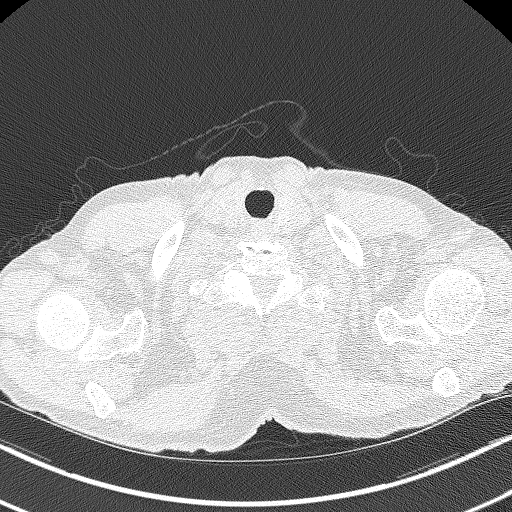

[15 of 40 positions shown; findings below may reference images not displayed]

FINDINGS: Cardiovascular: Normal heart size. Trace pericardial effusion.
Three-vessel coronary atherosclerosis. Atherosclerotic nonaneurysmal
thoracic aorta. Normal caliber pulmonary arteries.

Mediastinum/Nodes: Hypodense 0.9 cm right thyroid lobe nodule.
Unremarkable esophagus. No pathologically enlarged axillary,
mediastinal or hilar lymph nodes, noting limited sensitivity for the
detection of hilar adenopathy on this noncontrast study.

Lungs/Pleura: No pneumothorax. No pleural effusion. Mild
centrilobular and paraseptal emphysema with mild diffuse bronchial
wall thickening. No acute consolidative airspace disease or lung
masses. A few scattered small solid pulmonary nodules scattered in
both lungs, largest 3.6 mm in volume derived mean diameter in the
peripheral right middle lobe (series 9/image 199).

Upper abdomen: Hyperdense 1.2 cm upper left renal cortical lesion
with density 79 HU, most compatible with a benign
hemorrhagic/proteinaceous renal cyst, which requires no follow-up (
This recommendation follows ACR consensus guidelines: Management of
the Incidental Renal Mass on CT: A White Paper of the ACR Incidental
Findings Committee. [HOSPITAL] [YH];[DATE]. ).

Musculoskeletal: No aggressive appearing focal osseous lesions.
Marked thoracic spondylosis.
IMPRESSION: 1. Lung-RADS 2, benign appearance or behavior. Continue annual
screening with low-dose chest CT without contrast in 12 months.
2. Three-vessel coronary atherosclerosis.

Aortic Atherosclerosis ([YH]-[YH]) and Emphysema ([YH]-[YH]).

## 2019-04-02 NOTE — Progress Notes (Deleted)
@Patient  ID: Drew Nguyen, male    DOB: 08/08/1956, 63 y.o.   MRN: [id number]  No chief complaint on file.   Referring provider: Boyce Medici, FNP  HPI:  63 year old male current every day smoker initially referred to our office for evaluation of COPD  PMH: Rheumatoid arthritis Smoker/ Smoking History: Current smoker.  1.5 packs/day.  72-pack-year smoking history. Maintenance: Symbicort 160 Pt of: Dr. Elsworth Soho   04/02/2019  - Visit   HPI  Tests:   Chart review >>> Patient has scheduled lung cancer screening CT in March/2020  10/25/2018-pulmonary function test-FVC 4.18 (101% predicted), postbronchodilator ratio 64, postbronchodilator FEV1 2.73 (88% predicted), slight mid flow reversibility after bronchodilator.  No bronchodilator response, DLCO 82  03/04/2019-CT chest lung cancer screening-lung RADS 2, benign appearance or behavior, repeat in 12 months, mild centrilobular and paraseptal emphysema, few scattered small solid pulmonary nodules in both lungs largest being 3.6 mm  FENO:  No results found for: NITRICOXIDE  PFT: PFT Results Latest Ref Rng & Units 10/25/2018  FVC-Pre L 4.18  FVC-Predicted Pre % 101  FVC-Post L 4.28  FVC-Predicted Post % 104  Pre FEV1/FVC % % 64  Post FEV1/FCV % % 64  FEV1-Pre L 2.69  FEV1-Predicted Pre % 87  FEV1-Post L 2.73  DLCO UNC% % 82  DLCO COR %Predicted % 75  TLC L 6.86  TLC % Predicted % 109  RV % Predicted % 130    Imaging: Ct Chest Lung Ca Screen Low Dose W/o Cm  Result Date: 03/04/2019 CLINICAL DATA:  63 year old asymptomatic male current smoker with 96 pack-year smoking history. EXAM: CT CHEST WITHOUT CONTRAST LOW-DOSE FOR LUNG CANCER SCREENING TECHNIQUE: Multidetector CT imaging of the chest was performed following the standard protocol without IV contrast. COMPARISON:  None. FINDINGS: Cardiovascular: Normal heart size. Trace pericardial effusion. Three-vessel coronary atherosclerosis. Atherosclerotic nonaneurysmal thoracic aorta.  Normal caliber pulmonary arteries. Mediastinum/Nodes: Hypodense 0.9 cm right thyroid lobe nodule. Unremarkable esophagus. No pathologically enlarged axillary, mediastinal or hilar lymph nodes, noting limited sensitivity for the detection of hilar adenopathy on this noncontrast study. Lungs/Pleura: No pneumothorax. No pleural effusion. Mild centrilobular and paraseptal emphysema with mild diffuse bronchial wall thickening. No acute consolidative airspace disease or lung masses. A few scattered small solid pulmonary nodules scattered in both lungs, largest 3.6 mm in volume derived mean diameter in the peripheral right middle lobe (series 9/image 199). Upper abdomen: Hyperdense 1.2 cm upper left renal cortical lesion with density 79 HU, most compatible with a benign hemorrhagic/proteinaceous renal cyst, which requires no follow-up ( This recommendation follows ACR consensus guidelines: Management of the Incidental Renal Mass on CT: A White Paper of the ACR Incidental Findings Committee. J Am Coll Radiol [phone]. ). Musculoskeletal: No aggressive appearing focal osseous lesions. Marked thoracic spondylosis. IMPRESSION: 1. Lung-RADS 2, benign appearance or behavior. Continue annual screening with low-dose chest CT without contrast in 12 months. 2. Three-vessel coronary atherosclerosis. Aortic Atherosclerosis (ICD10-I70.0) and Emphysema (ICD10-J43.9). Electronically Signed   By: Ilona Sorrel M.D.   On: 03/04/2019 15:46      Specialty Problems      Pulmonary Problems   COPD GOLD I, still smoking    10/25/2018-pulmonary function test-FVC 4.18 (101% predicted), postbronchodilator ratio 64, postbronchodilator FEV1 2.73 (88% predicted), slight mid flow reversibility after bronchodilator.  No bronchodilator response, DLCO 82          No Known Allergies  Immunization History  Administered Date(s) Administered   Influenza,inj,Quad PF,6+ Mos 09/13/2018   Pneumococcal Polysaccharide-23  10/25/2018     Past Medical History:  Diagnosis Date   COPD (chronic obstructive pulmonary disease) ([place])    Current smoker    Depression    Hypertension    RA (rheumatoid arthritis) ([place])    Tobacco abuse     Tobacco History: Social History   Tobacco Use  Smoking Status Current Every Day Smoker   Packs/day: 1.50   Years: 48.00   Pack years: 72.00   Types: Cigarettes  Smokeless Tobacco Never Used  Tobacco Comment   10/25/18 1 to 1 1/2 ppd   Ready to quit: Not Answered Counseling given: Not Answered Comment: 10/25/18 1 to 1 1/2 ppd   Continue to not smoke  Outpatient Encounter Medications as of 04/03/2019  Medication Sig   acetaminophen (TYLENOL) 500 MG tablet Take 500 mg by mouth every 6 (six) hours as needed.   citalopram (CELEXA) 10 MG tablet Take 10 mg by mouth daily.   fluconazole (DIFLUCAN) 100 MG tablet Take 1 tablet (100 mg total) by mouth daily.   folic acid (FOLVITE) 1 MG tablet Take 1 mg by mouth daily.   Glycopyrrolate-Formoterol (BEVESPI AEROSPHERE) 9-4.8 MCG/ACT AERO Inhale 2 puffs into the lungs 2 (two) times daily. This is not a rescue inhaler.  Take this daily no matter what.   meloxicam (MOBIC) 7.5 MG tablet TAKE 1 TABLET BY MOUTH 1 TO 2 TIMES DAILY   omeprazole (PRILOSEC) 20 MG capsule TAKE 1 CAPSULE BY MOUTH IN THE MORNING   omeprazole (PRILOSEC) 20 MG capsule Take 1 capsule (20 mg total) by mouth 2 (two) times daily for 10 days.   predniSONE (DELTASONE) 10 MG tablet TAKE 1 TABLET BY MOUTH IN THE MORNING AND 1 2 (ONE HALF) IN THE EVENING   PROAIR HFA 108 (90 Base) MCG/ACT inhaler TO INHALE 1 TO 2 PUFFS EVERY 4 TO 6 HOURS AS NEEDED FOR COUGH WHEEZE OR SHORTNESS OF BREATH   tamsulosin (FLOMAX) 0.4 MG CAPS capsule TAKE 1 CAPSULE BY MOUTH ONCE DAILY FOR URINARY TRACT   TURMERIC PO Take by mouth.   Facility-Administered Encounter Medications as of 04/03/2019  Medication   0.9 %  sodium chloride infusion     Review of Systems  Review of  Systems   Physical Exam  There were no vitals taken for this visit.  Wt Readings from Last 5 Encounters:  01/30/19 140 lb (63.5 kg)  01/08/19 140 lb (63.5 kg)  10/25/18 139 lb (63 kg)  09/13/18 138 lb (62.6 kg)  02/02/15 156 lb (70.8 kg)     Physical Exam   Lab Results:  CBC    Component Value Date/Time   WBC 9.6 05/03/2010 2059   RBC 4.64 05/03/2010 2059   HGB 14.8 05/03/2010 2059   HCT 43.7 05/03/2010 2059   PLT 280 05/03/2010 2059   MCV 94.1 05/03/2010 2059   MCH 31.8 05/03/2010 2059   MCHC 33.8 05/03/2010 2059   RDW 13.1 05/03/2010 2059   LYMPHSABS 2.0 05/03/2010 2059   MONOABS 0.6 05/03/2010 2059   EOSABS 0.1 05/03/2010 2059   BASOSABS 0.0 05/03/2010 2059    BMET    Component Value Date/Time   NA 137 05/03/2010 2059   K 3.6 05/03/2010 2059   CL 105 05/03/2010 2059   CO2 24 05/03/2010 2059   GLUCOSE 87 05/03/2010 2059   BUN 8 05/03/2010 2059   CREATININE 0.95 05/03/2010 2059   CALCIUM 9.3 05/03/2010 2059   GFRNONAA >60 05/03/2010 2059   GFRAA  05/03/2010 2059    >  60        The eGFR has been calculated using the MDRD equation. This calculation has not been validated in all clinical situations. eGFR's persistently <60 mL/min signify possible Chronic Kidney Disease.    BNP No results found for: BNP  ProBNP No results found for: PROBNP    Assessment & Plan:   No problem-specific Assessment & Plan notes found for this encounter.    No follow-ups on file.   Lauraine Rinne, NP 04/02/2019   This appointment was *** minutes long with over 50% of the time in direct face-to-face patient care, assessment, plan of care, and follow-up.

## 2019-04-03 ENCOUNTER — Ambulatory Visit: Payer: Medicaid Other | Admitting: Pulmonary Disease

## 2019-04-08 NOTE — Progress Notes (Deleted)
@Patient  ID: Drew Nguyen, male    DOB: 1955-12-06, 62 y.o.   MRN: [id number]  No chief complaint on file.   Referring provider: Boyce Medici, FNP  HPI:  63 year old male current every day smoker initially referred to our office for evaluation of COPD  PMH: Rheumatoid arthritis Smoker/ Smoking History: Current smoker.  1.5 packs/day.  72-pack-year smoking history. Maintenance: Symbicort 160 Pt of: Dr. Elsworth Soho  04/08/2019  - Visit   HPI  Tests:   Chart review >>> Patient has scheduled lung cancer screening CT in March/2020  10/25/2018-pulmonary function test-FVC 4.18 (101% predicted), postbronchodilator ratio 64, postbronchodilator FEV1 2.73 (88% predicted), slight mid flow reversibility after bronchodilator.  No bronchodilator response, DLCO 82  03/04/2019-CT chest lung cancer screening-lung RADS 2, benign appearance or behavior, repeat in 12 months, mild centrilobular and paraseptal emphysema, few scattered small solid pulmonary nodules in both lungs largest being 3.6 mm  FENO:  No results found for: NITRICOXIDE  PFT: PFT Results Latest Ref Rng & Units 10/25/2018  FVC-Pre L 4.18  FVC-Predicted Pre % 101  FVC-Post L 4.28  FVC-Predicted Post % 104  Pre FEV1/FVC % % 64  Post FEV1/FCV % % 64  FEV1-Pre L 2.69  FEV1-Predicted Pre % 87  FEV1-Post L 2.73  DLCO UNC% % 82  DLCO COR %Predicted % 75  TLC L 6.86  TLC % Predicted % 109  RV % Predicted % 130    Imaging: No results found.    Specialty Problems      Pulmonary Problems   COPD GOLD I, still smoking    10/25/2018-pulmonary function test-FVC 4.18 (101% predicted), postbronchodilator ratio 64, postbronchodilator FEV1 2.73 (88% predicted), slight mid flow reversibility after bronchodilator.  No bronchodilator response, DLCO 82          No Known Allergies  Immunization History  Administered Date(s) Administered  . Influenza,inj,Quad PF,6+ Mos 09/13/2018  . Pneumococcal Polysaccharide-23 10/25/2018    Past  Medical History:  Diagnosis Date  . COPD (chronic obstructive pulmonary disease) ([place])   . Current smoker   . Depression   . Hypertension   . RA (rheumatoid arthritis) ([place])   . Tobacco abuse     Tobacco History: Social History   Tobacco Use  Smoking Status Current Every Day Smoker  . Packs/day: 1.50  . Years: 48.00  . Pack years: 72.00  . Types: Cigarettes  Smokeless Tobacco Never Used  Tobacco Comment   10/25/18 1 to 1 1/2 ppd   Ready to quit: Not Answered Counseling given: Not Answered Comment: 10/25/18 1 to 1 1/2 ppd   Continue to not smoke  Outpatient Encounter Medications as of 04/10/2019  Medication Sig  . acetaminophen (TYLENOL) 500 MG tablet Take 500 mg by mouth every 6 (six) hours as needed.  . citalopram (CELEXA) 10 MG tablet Take 10 mg by mouth daily.  . fluconazole (DIFLUCAN) 100 MG tablet Take 1 tablet (100 mg total) by mouth daily.  . folic acid (FOLVITE) 1 MG tablet Take 1 mg by mouth daily.  . Glycopyrrolate-Formoterol (BEVESPI AEROSPHERE) 9-4.8 MCG/ACT AERO Inhale 2 puffs into the lungs 2 (two) times daily. This is not a rescue inhaler.  Take this daily no matter what.  . meloxicam (MOBIC) 7.5 MG tablet TAKE 1 TABLET BY MOUTH 1 TO 2 TIMES DAILY  . omeprazole (PRILOSEC) 20 MG capsule TAKE 1 CAPSULE BY MOUTH IN THE MORNING  . omeprazole (PRILOSEC) 20 MG capsule Take 1 capsule (20 mg total) by mouth 2 (  two) times daily for 10 days.  . predniSONE (DELTASONE) 10 MG tablet TAKE 1 TABLET BY MOUTH IN THE MORNING AND 1 2 (ONE HALF) IN THE EVENING  . PROAIR HFA 108 (90 Base) MCG/ACT inhaler TO INHALE 1 TO 2 PUFFS EVERY 4 TO 6 HOURS AS NEEDED FOR COUGH WHEEZE OR SHORTNESS OF BREATH  . tamsulosin (FLOMAX) 0.4 MG CAPS capsule TAKE 1 CAPSULE BY MOUTH ONCE DAILY FOR URINARY TRACT  . TURMERIC PO Take by mouth.   Facility-Administered Encounter Medications as of 04/10/2019  Medication  . 0.9 %  sodium chloride infusion     Review of Systems  Review of Systems    Physical Exam  There were no vitals taken for this visit.  Wt Readings from Last 5 Encounters:  01/30/19 140 lb (63.5 kg)  01/08/19 140 lb (63.5 kg)  10/25/18 139 lb (63 kg)  09/13/18 138 lb (62.6 kg)  02/02/15 156 lb (70.8 kg)     Physical Exam   Lab Results:  CBC    Component Value Date/Time   WBC 9.6 05/03/2010 2059   RBC 4.64 05/03/2010 2059   HGB 14.8 05/03/2010 2059   HCT 43.7 05/03/2010 2059   PLT 280 05/03/2010 2059   MCV 94.1 05/03/2010 2059   MCH 31.8 05/03/2010 2059   MCHC 33.8 05/03/2010 2059   RDW 13.1 05/03/2010 2059   LYMPHSABS 2.0 05/03/2010 2059   MONOABS 0.6 05/03/2010 2059   EOSABS 0.1 05/03/2010 2059   BASOSABS 0.0 05/03/2010 2059    BMET    Component Value Date/Time   NA 137 05/03/2010 2059   K 3.6 05/03/2010 2059   CL 105 05/03/2010 2059   CO2 24 05/03/2010 2059   GLUCOSE 87 05/03/2010 2059   BUN 8 05/03/2010 2059   CREATININE 0.95 05/03/2010 2059   CALCIUM 9.3 05/03/2010 2059   GFRNONAA >60 05/03/2010 2059   GFRAA  05/03/2010 2059    >60        The eGFR has been calculated using the MDRD equation. This calculation has not been validated in all clinical situations. eGFR's persistently <60 mL/min signify possible Chronic Kidney Disease.    BNP No results found for: BNP  ProBNP No results found for: PROBNP    Assessment & Plan:   No problem-specific Assessment & Plan notes found for this encounter.    No follow-ups on file.   Lauraine Rinne, NP 04/08/2019   This appointment was *** minutes long with over 50% of the time in direct face-to-face patient care, assessment, plan of care, and follow-up.

## 2019-04-10 ENCOUNTER — Ambulatory Visit: Payer: Medicaid Other | Admitting: Pulmonary Disease

## 2019-04-19 ENCOUNTER — Inpatient Hospital Stay (HOSPITAL_COMMUNITY)
Admission: EM | Admit: 2019-04-19 | Discharge: 2019-04-20 | DRG: 065 | Disposition: A | Discharge status: Home / Self Care | Payer: Medicaid Other | Attending: Family Medicine | Admitting: Family Medicine

## 2019-04-19 ENCOUNTER — Inpatient Hospital Stay (HOSPITAL_COMMUNITY): Payer: Medicaid Other

## 2019-04-19 ENCOUNTER — Encounter (HOSPITAL_COMMUNITY): Payer: Self-pay

## 2019-04-19 ENCOUNTER — Other Ambulatory Visit: Payer: Self-pay

## 2019-04-19 ENCOUNTER — Emergency Department (HOSPITAL_COMMUNITY): Payer: Medicaid Other

## 2019-04-19 DIAGNOSIS — J449 Chronic obstructive pulmonary disease, unspecified: Secondary | ICD-10-CM | POA: Diagnosis present

## 2019-04-19 DIAGNOSIS — F4320 Adjustment disorder, unspecified: Secondary | ICD-10-CM | POA: Diagnosis present

## 2019-04-19 DIAGNOSIS — Z7952 Long term (current) use of systemic steroids: Secondary | ICD-10-CM

## 2019-04-19 DIAGNOSIS — I444 Left anterior fascicular block: Secondary | ICD-10-CM | POA: Diagnosis present

## 2019-04-19 DIAGNOSIS — F329 Major depressive disorder, single episode, unspecified: Secondary | ICD-10-CM | POA: Diagnosis present

## 2019-04-19 DIAGNOSIS — I6300 Cerebral infarction due to thrombosis of unspecified precerebral artery: Secondary | ICD-10-CM

## 2019-04-19 DIAGNOSIS — I639 Cerebral infarction, unspecified: Secondary | ICD-10-CM | POA: Diagnosis present

## 2019-04-19 DIAGNOSIS — E876 Hypokalemia: Secondary | ICD-10-CM

## 2019-04-19 DIAGNOSIS — I10 Essential (primary) hypertension: Secondary | ICD-10-CM | POA: Diagnosis not present

## 2019-04-19 DIAGNOSIS — F4321 Adjustment disorder with depressed mood: Secondary | ICD-10-CM | POA: Diagnosis not present

## 2019-04-19 DIAGNOSIS — I160 Hypertensive urgency: Secondary | ICD-10-CM | POA: Diagnosis present

## 2019-04-19 DIAGNOSIS — Z8249 Family history of ischemic heart disease and other diseases of the circulatory system: Secondary | ICD-10-CM | POA: Diagnosis not present

## 2019-04-19 DIAGNOSIS — Z79899 Other long term (current) drug therapy: Secondary | ICD-10-CM

## 2019-04-19 DIAGNOSIS — F1410 Cocaine abuse, uncomplicated: Secondary | ICD-10-CM | POA: Diagnosis present

## 2019-04-19 DIAGNOSIS — Z888 Allergy status to other drugs, medicaments and biological substances status: Secondary | ICD-10-CM

## 2019-04-19 DIAGNOSIS — F17210 Nicotine dependence, cigarettes, uncomplicated: Secondary | ICD-10-CM | POA: Diagnosis present

## 2019-04-19 DIAGNOSIS — R270 Ataxia, unspecified: Secondary | ICD-10-CM | POA: Diagnosis present

## 2019-04-19 DIAGNOSIS — G8191 Hemiplegia, unspecified affecting right dominant side: Secondary | ICD-10-CM | POA: Diagnosis present

## 2019-04-19 DIAGNOSIS — E785 Hyperlipidemia, unspecified: Secondary | ICD-10-CM | POA: Diagnosis present

## 2019-04-19 DIAGNOSIS — Z20828 Contact with and (suspected) exposure to other viral communicable diseases: Secondary | ICD-10-CM | POA: Diagnosis present

## 2019-04-19 DIAGNOSIS — R29700 NIHSS score 0: Secondary | ICD-10-CM | POA: Diagnosis present

## 2019-04-19 DIAGNOSIS — M069 Rheumatoid arthritis, unspecified: Secondary | ICD-10-CM | POA: Diagnosis present

## 2019-04-19 DIAGNOSIS — Z720 Tobacco use: Secondary | ICD-10-CM | POA: Diagnosis present

## 2019-04-19 DIAGNOSIS — I4891 Unspecified atrial fibrillation: Secondary | ICD-10-CM | POA: Diagnosis present

## 2019-04-19 DIAGNOSIS — R1310 Dysphagia, unspecified: Secondary | ICD-10-CM | POA: Diagnosis present

## 2019-04-19 DIAGNOSIS — R4781 Slurred speech: Secondary | ICD-10-CM | POA: Diagnosis present

## 2019-04-19 DIAGNOSIS — I6381 Other cerebral infarction due to occlusion or stenosis of small artery: Secondary | ICD-10-CM | POA: Diagnosis present

## 2019-04-19 DIAGNOSIS — Z791 Long term (current) use of non-steroidal anti-inflammatories (NSAID): Secondary | ICD-10-CM

## 2019-04-19 DIAGNOSIS — G459 Transient cerebral ischemic attack, unspecified: Secondary | ICD-10-CM

## 2019-04-19 LAB — RAPID URINE DRUG SCREEN, HOSP PERFORMED
Amphetamines: NOT DETECTED
Barbiturates: NOT DETECTED
Benzodiazepines: POSITIVE — AB
Cocaine: POSITIVE — AB
Opiates: NOT DETECTED
Tetrahydrocannabinol: NOT DETECTED

## 2019-04-19 LAB — COMPREHENSIVE METABOLIC PANEL
ALT: 22 U/L (ref 0–44)
AST: 17 U/L (ref 15–41)
Albumin: 3.6 g/dL (ref 3.5–5.0)
Alkaline Phosphatase: 58 U/L (ref 38–126)
Anion gap: 8 (ref 5–15)
BUN: 14 mg/dL (ref 8–23)
CO2: 26 mmol/L (ref 22–32)
Calcium: 8.9 mg/dL (ref 8.9–10.3)
Chloride: 105 mmol/L (ref 98–111)
Creatinine, Ser: 0.95 mg/dL (ref 0.61–1.24)
GFR calc Af Amer: >60 mL/min (ref >60)
GFR calc non Af Amer: >60 mL/min (ref >60)
Glucose, Bld: 99 mg/dL (ref 70–99)
Potassium: 3.2 mmol/L — ABNORMAL LOW (ref 3.5–5.1)
Sodium: 139 mmol/L (ref 135–145)
Total Bilirubin: 0.8 mg/dL (ref 0.3–1.2)
Total Protein: 6.3 g/dL — ABNORMAL LOW (ref 6.5–8.1)

## 2019-04-19 LAB — URINALYSIS, ROUTINE W REFLEX MICROSCOPIC
Bilirubin Urine: NEGATIVE
Glucose, UA: NEGATIVE mg/dL
Hgb urine dipstick: NEGATIVE
Ketones, ur: NEGATIVE mg/dL
Leukocytes,Ua: NEGATIVE
Nitrite: NEGATIVE
Protein, ur: NEGATIVE mg/dL
Specific Gravity, Urine: 1.012 (ref 1.005–1.030)
pH: 6 (ref 5.0–8.0)

## 2019-04-19 LAB — DIFFERENTIAL
Abs Immature Granulocytes: 0.07 10*3/uL (ref 0.00–0.07)
Basophils Absolute: 0.1 10*3/uL (ref 0.0–0.1)
Basophils Relative: 0 %
Eosinophils Absolute: 0.2 10*3/uL (ref 0.0–0.5)
Eosinophils Relative: 1 %
Immature Granulocytes: 1 %
Lymphocytes Relative: 9 %
Lymphs Abs: 1.3 10*3/uL (ref 0.7–4.0)
Monocytes Absolute: 0.9 10*3/uL (ref 0.1–1.0)
Monocytes Relative: 6 %
Neutro Abs: 11.7 10*3/uL — ABNORMAL HIGH (ref 1.7–7.7)
Neutrophils Relative %: 83 %

## 2019-04-19 LAB — CBC
HCT: 40 % (ref 39.0–52.0)
Hemoglobin: 13.7 g/dL (ref 13.0–17.0)
MCH: 33.3 pg (ref 26.0–34.0)
MCHC: 34.3 g/dL (ref 30.0–36.0)
MCV: 97.3 fL (ref 80.0–100.0)
Platelets: 341 10*3/uL (ref 150–400)
RBC: 4.11 MIL/uL — ABNORMAL LOW (ref 4.22–5.81)
RDW: 13.4 % (ref 11.5–15.5)
WBC: 14.1 10*3/uL — ABNORMAL HIGH (ref 4.0–10.5)
nRBC: 0 % (ref 0.0–0.2)

## 2019-04-19 LAB — PROTIME-INR
INR: 0.9 (ref 0.8–1.2)
Prothrombin Time: 12 seconds (ref 11.4–15.2)

## 2019-04-19 LAB — TSH: TSH: 1.468 u[IU]/mL (ref 0.350–4.500)

## 2019-04-19 LAB — APTT: aPTT: 28 seconds (ref 24–36)

## 2019-04-19 LAB — SARS CORONAVIRUS 2 (TAT 6-24 HRS): SARS Coronavirus 2: NEGATIVE

## 2019-04-19 IMAGING — CT CT ANGIOGRAPHY HEAD
3 of 8 series · 9 of 36 positions shown · IV contrast (APPLIED)
Comparison: Head CT earlier same day
COMPARISON: Head CT earlier same day

Addendum:
CLINICAL DATA: Speech disturbance. Right-sided weakness. Altered
mental status.

EXAM:
CT ANGIOGRAPHY HEAD AND NECK
TECHNIQUE: Multidetector CT imaging of the head and neck was performed using
the standard protocol during bolus administration of intravenous
contrast. Multiplanar CT image reconstructions and MIPs were
obtained to evaluate the vascular anatomy. Carotid stenosis
measurements (when applicable) are obtained utilizing NASCET
criteria, using the distal internal carotid diameter as the
denominator.
CONTRAST:  75mL OMNIPAQUE IOHEXOL 350 MG/ML SOLN

[Series 7: cta neck/head · axial · 0.70mm/px · z∈[-294,-49]mm · 5 of 697 slices shown]
[im 117/697  soft-tissue]
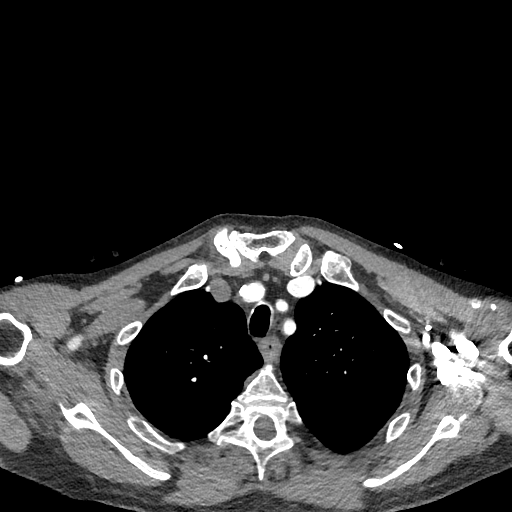
[im 233/697  bone]
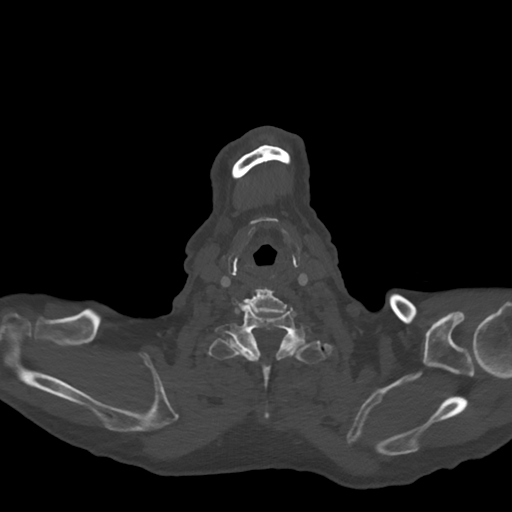
[im 349/697  soft-tissue]
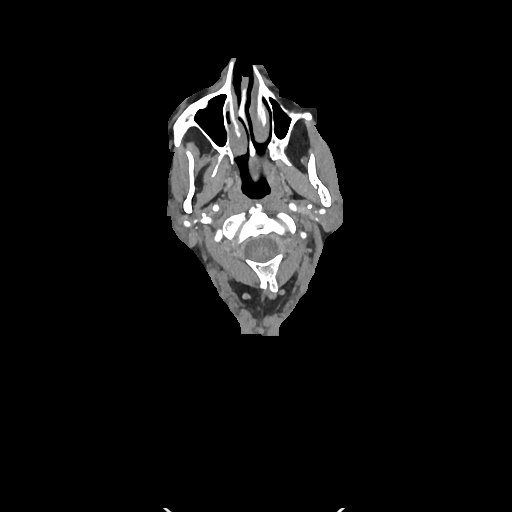
[im 465/697  bone]
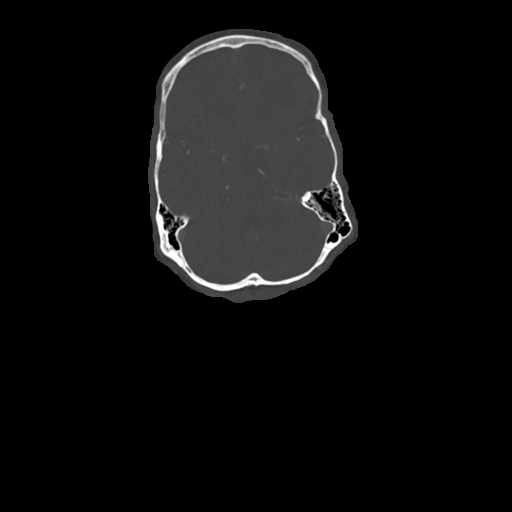
[im 581/697  soft-tissue]
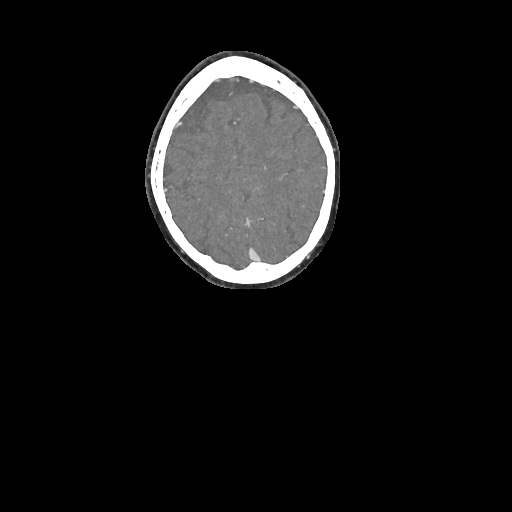

[Series 8: ax thins · axial · 0.39mm/px · z∈[-236,-114]mm · 2 of 368 slices shown]
[im 123/368  soft-tissue]
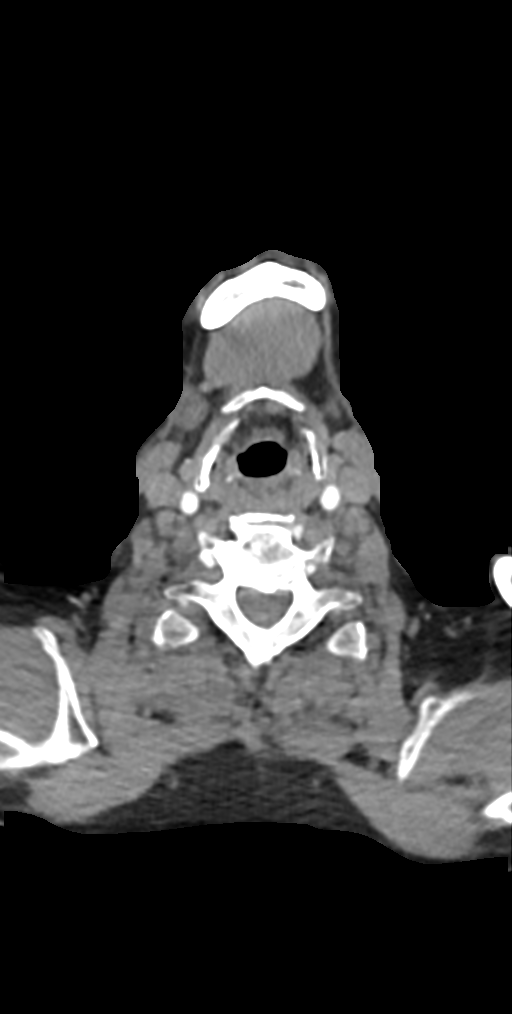
[im 245/368  soft-tissue]
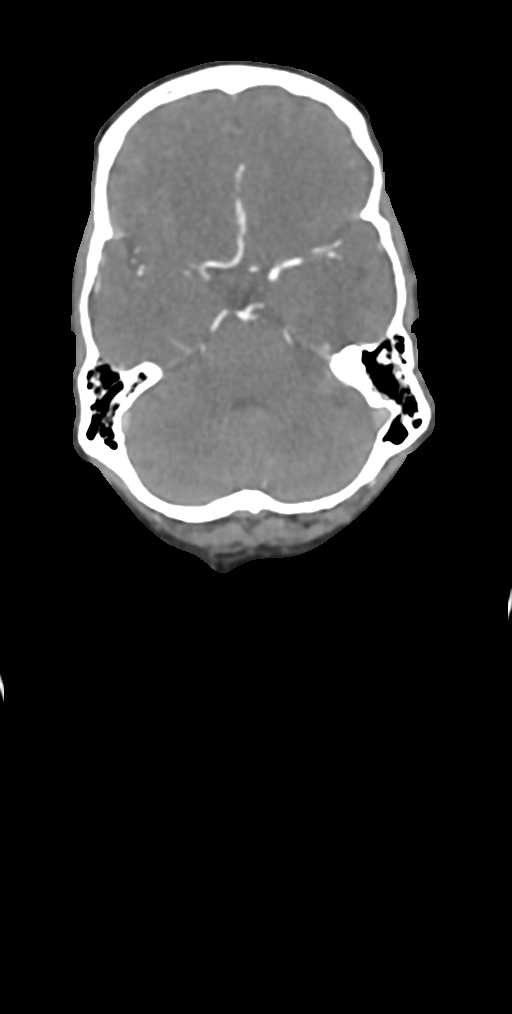

[Series 10: sag thins · sagittal · 0.74mm/px · 2 of 225 slices shown]
[im 18/225  soft-tissue]
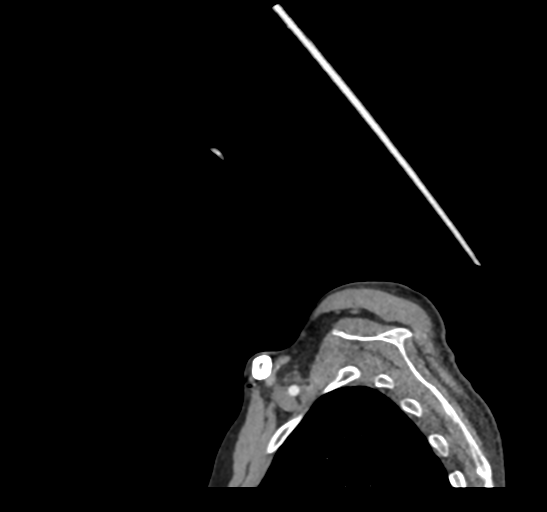
[im 208/225  soft-tissue]
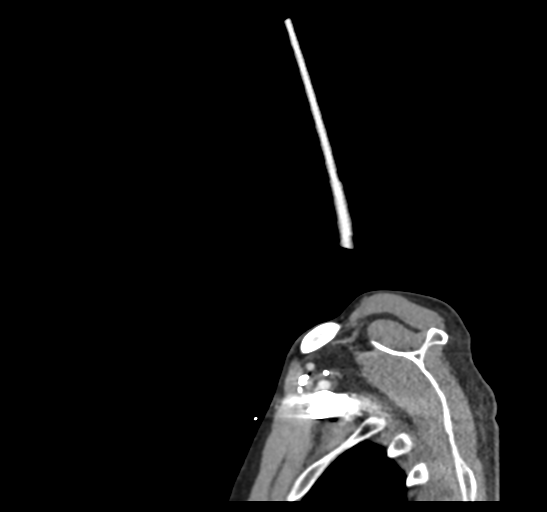

[9 of 36 positions shown; findings below may reference images not displayed]

FINDINGS: CTA NECK FINDINGS

Aortic arch: Mild aortic atherosclerosis. No aneurysm or dissection.
Branching pattern is normal.

Right carotid system: Common carotid artery widely patent to the
bifurcation. There is calcified plaque at the carotid bifurcation
and ICA bulb. Minimal diameter of the proximal ICA is 4 mm. Compared
to a more distal cervical diameter of 4 mm, there is no stenosis.

Left carotid system: Common carotid artery is widely patent to the
bifurcation region. There is calcified plaque at the carotid
bifurcation and ICA bulb. Minimal diameter is 4.0 mm. No stenosis.

Vertebral arteries: There is atherosclerotic plaque at the right
vertebral artery origin with 50% stenosis. No left vertebral artery
stenosis. Beyond the origins, both vertebral arteries are widely
patent to the foramen magnum.

Skeleton: Degenerative spondylosis.

Other neck: No mass or lymphadenopathy.

Upper chest: Emphysema and pulmonary scarring.  No active process

Review of the MIP images confirms the above findings

CTA HEAD FINDINGS

Anterior circulation: Both internal carotid arteries are patent
through the skull base. There is atherosclerotic change in both
carotid siphon regions. Stenosis is estimated at 50% on each side.
The anterior and middle cerebral vessels are patent without proximal
stenosis, aneurysm or vascular malformation. No large or medium
vessel occlusion is seen.

Posterior circulation: Both vertebral arteries are patent through
the foramen magnum to the basilar. No basilar stenosis. Posterior
circulation branch vessels show flow.

Venous sinuses: Patent and normal.

Anatomic variants: None significant.

Review of the MIP images confirms the above findings
IMPRESSION: Atherosclerotic disease at both carotid bifurcations, but no
stenosis when compared to the diameter of the more distal cervical
internal carotid arteries.

50% stenosis of the right vertebral artery origin. No other
posterior circulation stenosis in the neck.

Atherosclerotic disease in both carotid bifurcation regions with 50%
stenosis on each side.

No intracranial large or medium vessel occlusion.

ADDENDUM:
Speech recognition error in the initial report. Third impression
should read as follows.

Atherosclerotic disease in both carotid siphon regions with 50
percent stenosis on each side.

To clarify, there is no stenosis at the carotid bifurcation regions.

*** End of Addendum ***
FINDINGS: CTA NECK FINDINGS

Aortic arch: Mild aortic atherosclerosis. No aneurysm or dissection.
Branching pattern is normal.

Right carotid system: Common carotid artery widely patent to the
bifurcation. There is calcified plaque at the carotid bifurcation
and ICA bulb. Minimal diameter of the proximal ICA is 4 mm. Compared
to a more distal cervical diameter of 4 mm, there is no stenosis.

Left carotid system: Common carotid artery is widely patent to the
bifurcation region. There is calcified plaque at the carotid
bifurcation and ICA bulb. Minimal diameter is 4.0 mm. No stenosis.

Vertebral arteries: There is atherosclerotic plaque at the right
vertebral artery origin with 50% stenosis. No left vertebral artery
stenosis. Beyond the origins, both vertebral arteries are widely
patent to the foramen magnum.

Skeleton: Degenerative spondylosis.

Other neck: No mass or lymphadenopathy.

Upper chest: Emphysema and pulmonary scarring.  No active process

Review of the MIP images confirms the above findings

CTA HEAD FINDINGS

Anterior circulation: Both internal carotid arteries are patent
through the skull base. There is atherosclerotic change in both
carotid siphon regions. Stenosis is estimated at 50% on each side.
The anterior and middle cerebral vessels are patent without proximal
stenosis, aneurysm or vascular malformation. No large or medium
vessel occlusion is seen.

Posterior circulation: Both vertebral arteries are patent through
the foramen magnum to the basilar. No basilar stenosis. Posterior
circulation branch vessels show flow.

Venous sinuses: Patent and normal.

Anatomic variants: None significant.

Review of the MIP images confirms the above findings
IMPRESSION: Atherosclerotic disease at both carotid bifurcations, but no
stenosis when compared to the diameter of the more distal cervical
internal carotid arteries.

50% stenosis of the right vertebral artery origin. No other
posterior circulation stenosis in the neck.

Atherosclerotic disease in both carotid bifurcation regions with 50%
stenosis on each side.

No intracranial large or medium vessel occlusion.

## 2019-04-19 IMAGING — CR CHEST - 2 VIEW
2 series · 2 of 2 positions shown · non-contrast
Comparison: [DATE]

CLINICAL DATA: Patient presenting with stroke-like symptoms.

EXAM:
CHEST - 2 VIEW

[chest pa]
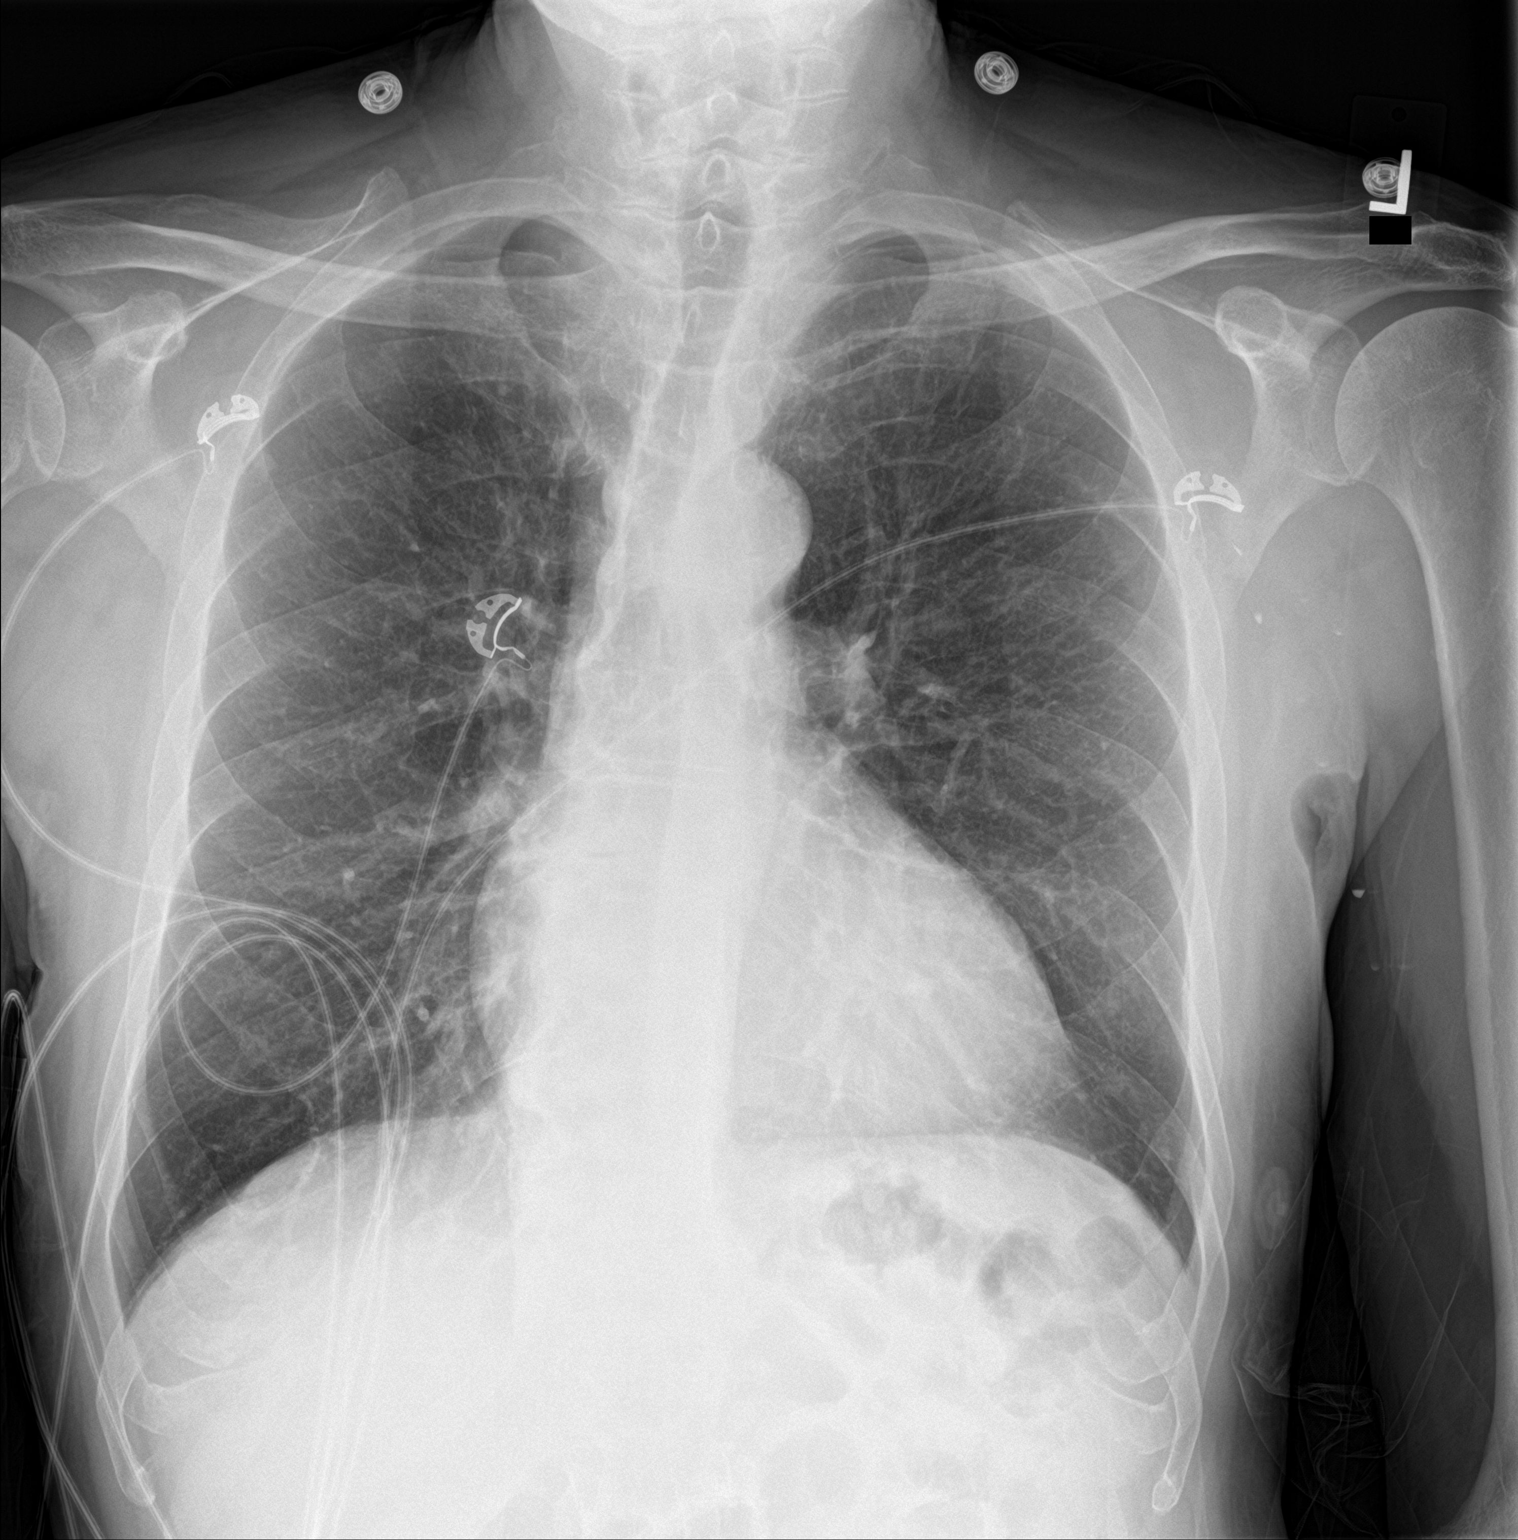

[chest lat]
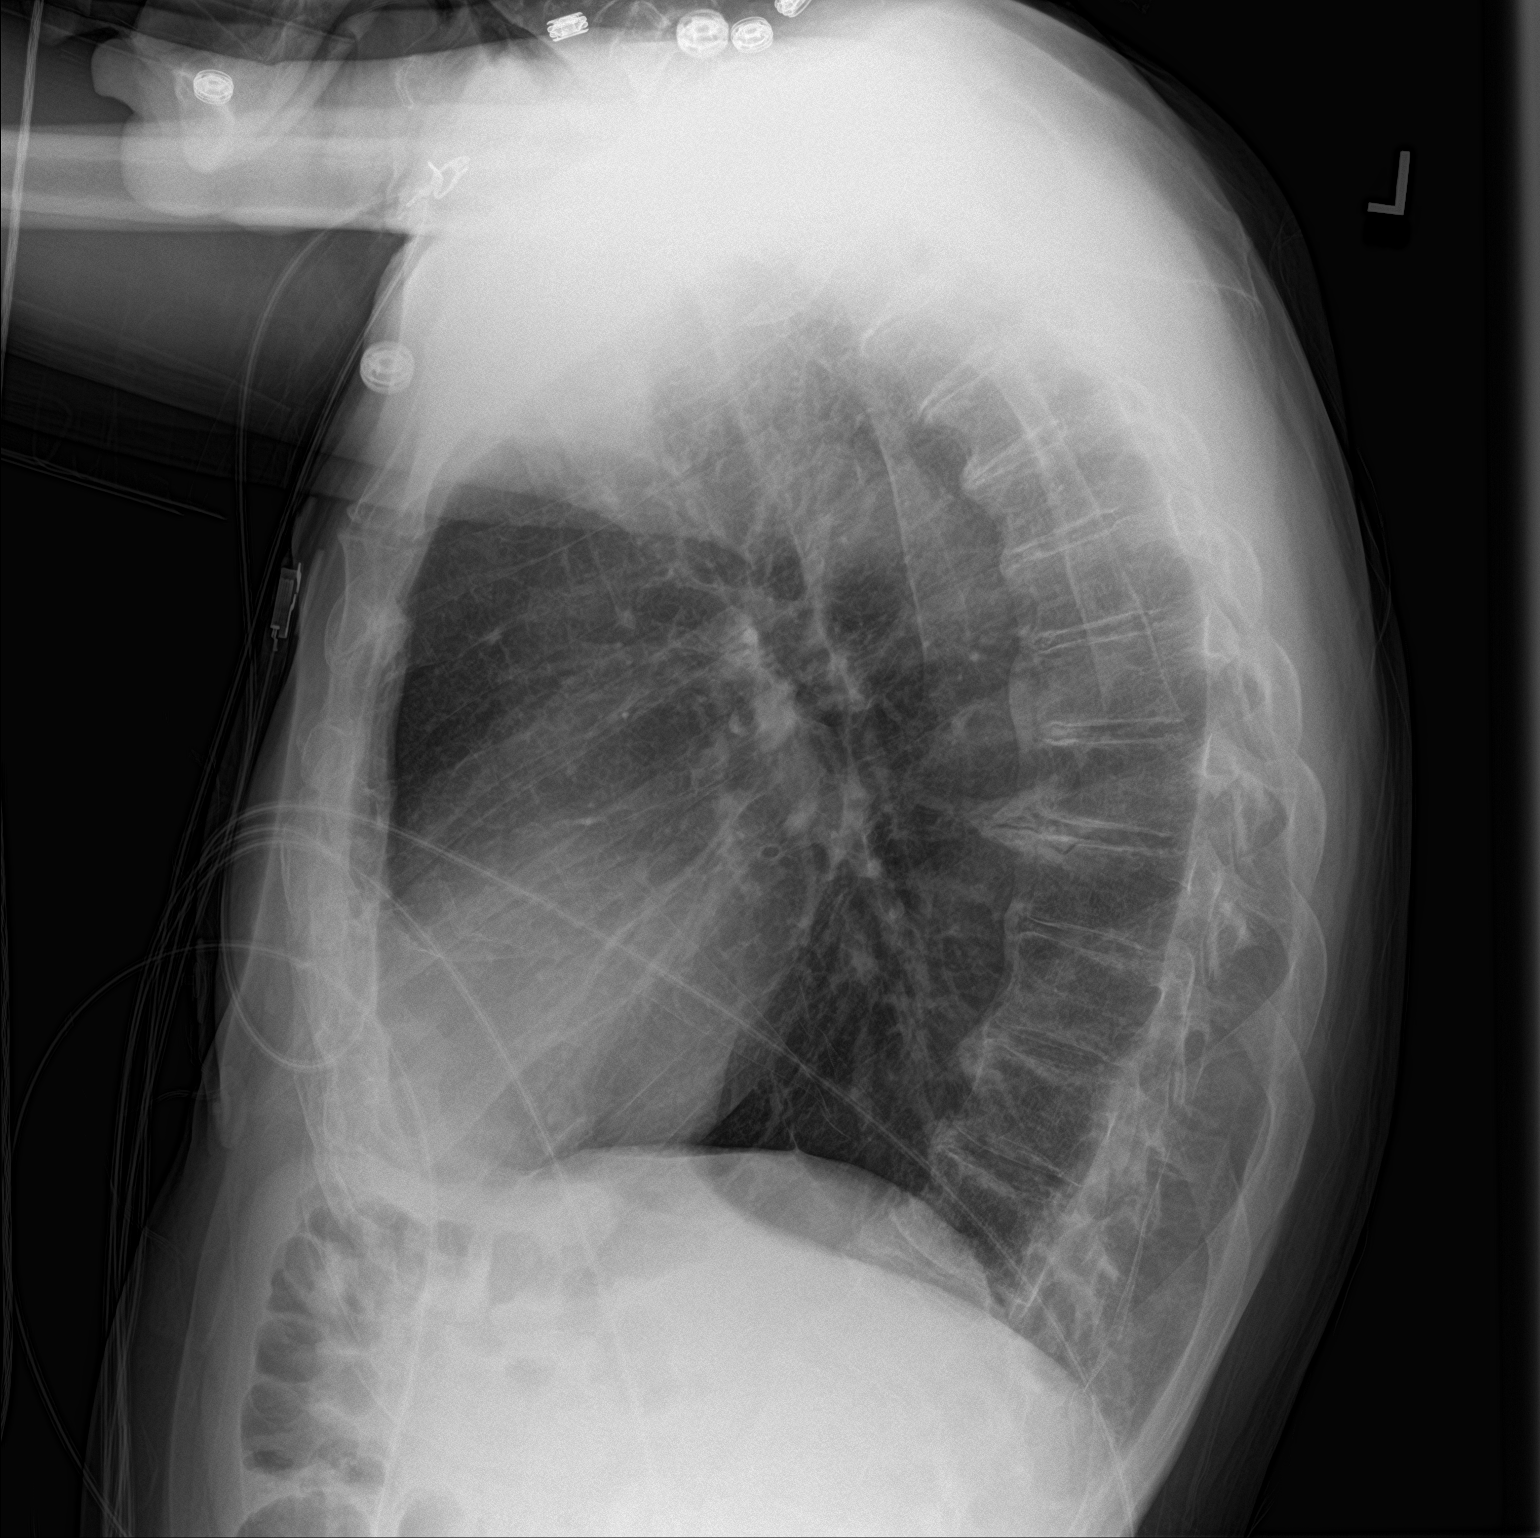

[2 of 2 positions shown; findings below may reference images not displayed]

FINDINGS: The heart size and mediastinal contours are within normal limits.
Both lungs are clear. The visualized skeletal structures are
unremarkable.
IMPRESSION: No active cardiopulmonary disease.

## 2019-04-19 IMAGING — MR MRI HEAD WITHOUT CONTRAST
12 of 13 series · 44 of 48 positions shown · non-contrast
Comparison: CT studies same day

CLINICAL DATA: Speech disturbance.  Right-sided weakness.

EXAM:
MRI HEAD WITHOUT CONTRAST
TECHNIQUE: Multiplanar, multiecho pulse sequences of the brain and surrounding
structures were obtained without intravenous contrast.

[Series 5: DWI · axial · 3.0mm · 0.92mm/px · z∈[-35,+112]mm · 8 of 104 slices shown (1 of 4)]
[im 1/104]
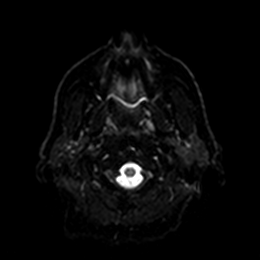
[im 15/104]
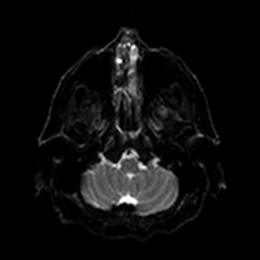
[im 30/104]
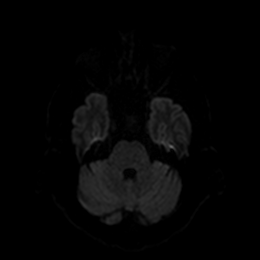
[im 45/104]
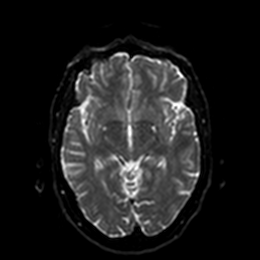
[im 59/104]
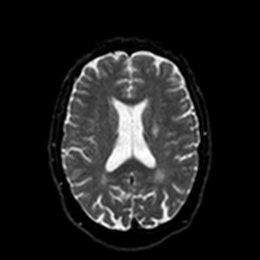
[im 74/104]
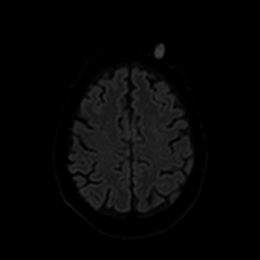
[im 89/104]
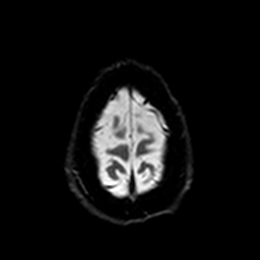
[im 104/104]
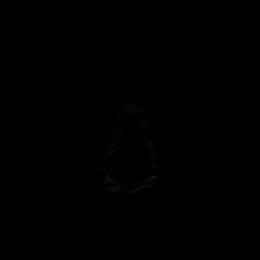

[Series 6: DWI · axial · 3.0mm · 0.92mm/px · z∈[-35,+112]mm · 4 of 51 slices shown (2 of 4)]
[im 1/51]
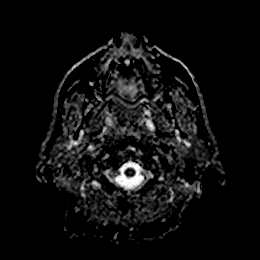
[im 17/51]
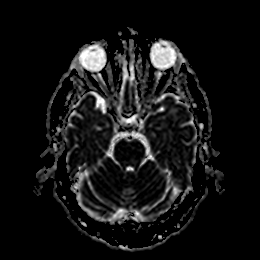
[im 34/51]
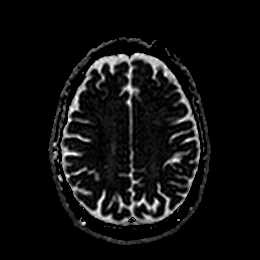
[im 51/51]
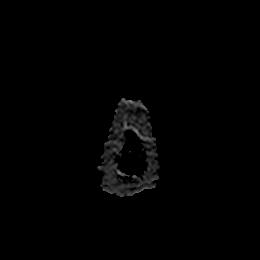

[Series 7: DWI · coronal · 4.0mm · 0.88mm/px · 6 of 78 slices shown (3 of 4)]
[im 1/78]
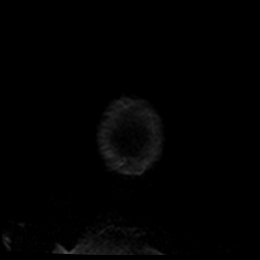
[im 16/78]
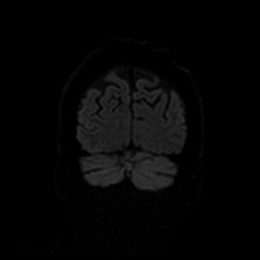
[im 31/78]
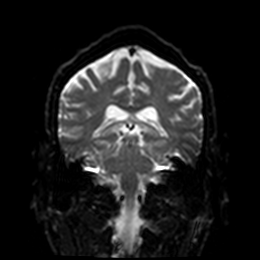
[im 47/78]
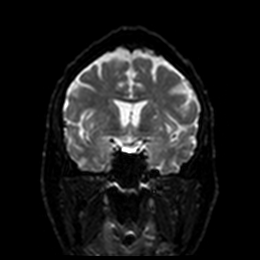
[im 62/78]
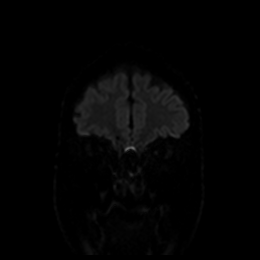
[im 78/78]
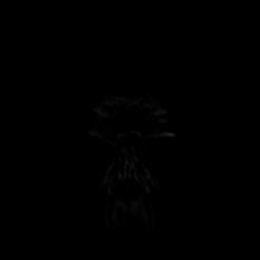

[Series 8: DWI · coronal · 4.0mm · 0.88mm/px · 3 of 39 slices shown (4 of 4)]
[im 1/39]
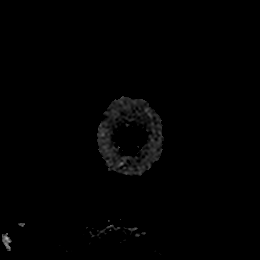
[im 20/39]
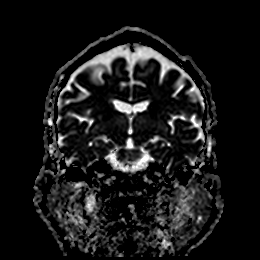
[im 39/39]
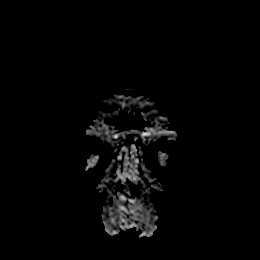

[Series 9: FLAIR · axial · 5.0mm · 0.47mm/px · z∈[-37,+113]mm · 2 of 27 slices shown]
[im 1/27]
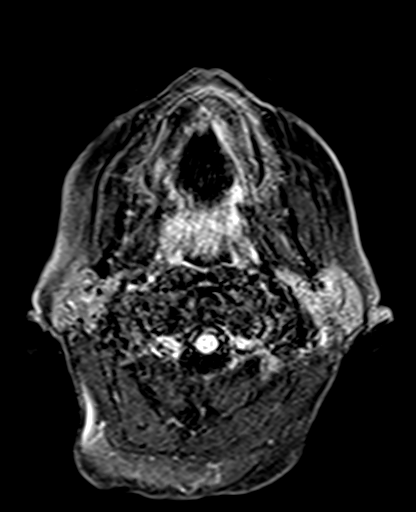
[im 27/27]
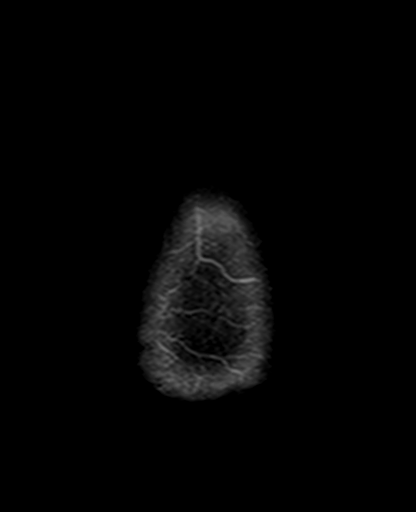

[Series 10: mag_images · axial · 3.0mm · 0.94mm/px · z∈[-35,+112]mm · 4 of 52 slices shown]
[im 1/52]
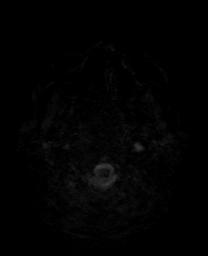
[im 18/52]
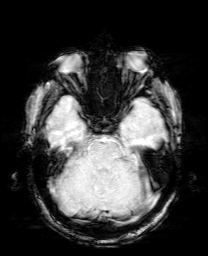
[im 35/52]
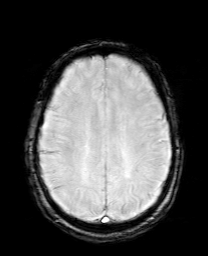
[im 52/52]
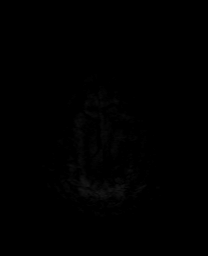

[Series 11: pha_images · axial · 3.0mm · 0.94mm/px · z∈[-35,+109]mm · 4 of 51 slices shown]
[im 1/51]
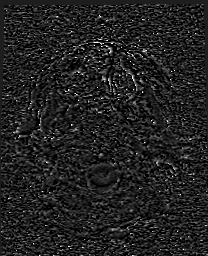
[im 17/51]
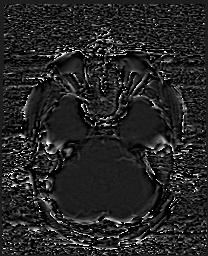
[im 34/51]
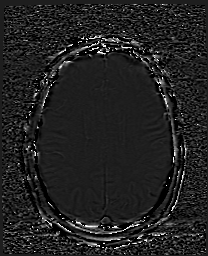
[im 51/51]
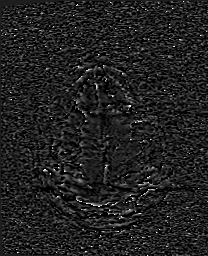

[Series 12: swi_images · axial · 3.0mm · 0.94mm/px · z∈[-35,+112]mm · 4 of 52 slices shown]
[im 1/52]
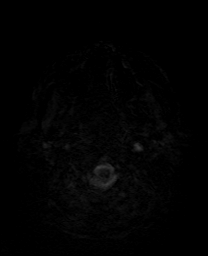
[im 18/52]
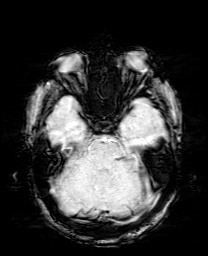
[im 35/52]
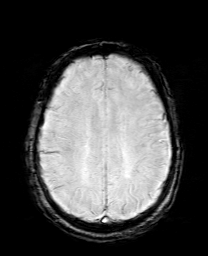
[im 52/52]
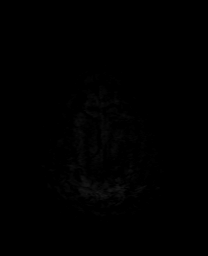

[Series 13: mip_images(sw) · axial · 24.0mm · 0.94mm/px · z∈[-25,+102]mm · 3 of 45 slices shown]
[im 1/45]
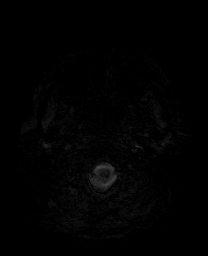
[im 23/45]
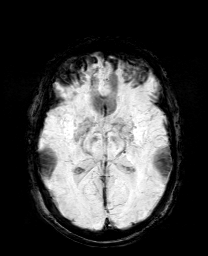
[im 45/45]
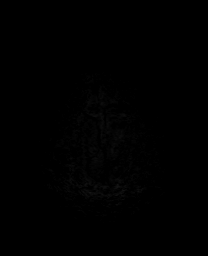

[Series 14: T1 · sagittal · 5.0mm · 0.75mm/px · 2 of 25 slices shown]
[im 1/25]
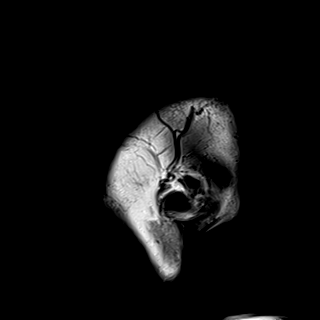
[im 25/25]
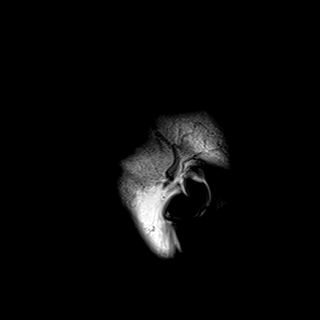

[Series 15: T2 · axial · 5.0mm · 0.75mm/px · z∈[-39,+111]mm · 2 of 27 slices shown (1 of 2)]
[im 1/27]
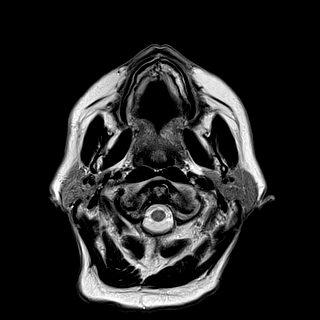
[im 27/27]
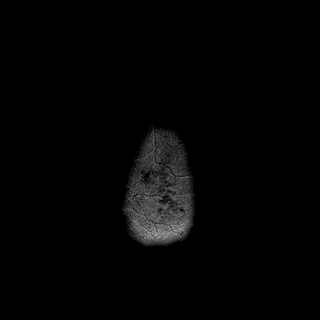

[Series 17: T2 · coronal · 5.0mm · 0.72mm/px · 2 of 31 slices shown (2 of 2)]
[im 1/31]
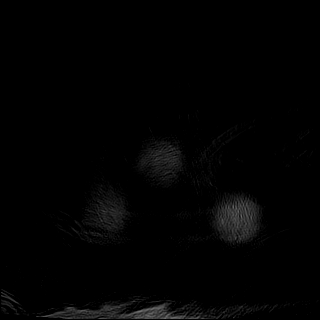
[im 31/31]
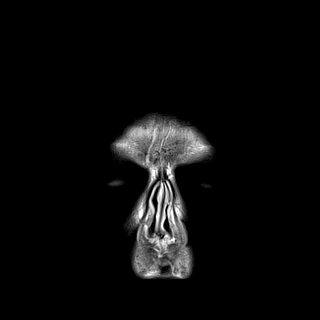

[44 of 48 positions shown; findings below may reference images not displayed]

FINDINGS: Brain: Diffusion imaging shows acute infarction in the left putamen
and radiating white matter tracts. No other acute infarction. No
swelling or hemorrhage. The brainstem and cerebellum are normal.
Cerebral hemispheres show mild chronic small-vessel change of the
deep white matter. No cortical or large vessel territory infarction.
No mass lesion, hydrocephalus or extra-axial collection.

Vascular: Major vessels at the base of the brain show flow.

Skull and upper cervical spine: Negative

Sinuses/Orbits: Clear/normal

Other: None
IMPRESSION: Acute infarction affecting the left putamen and radiating white
matter tracts. No mass effect or hemorrhage.

Mild chronic small-vessel ischemic change elsewhere affecting the
cerebral hemispheric white matter.

## 2019-04-19 IMAGING — CT CT HEAD WITHOUT CONTRAST
4 series · 16 of 47 positions shown, 18 images · non-contrast
Comparison: [DATE]

CLINICAL DATA: Right-sided weakness and slurred speech. Altered
mental status.

EXAM:
CT HEAD WITHOUT CONTRAST
TECHNIQUE: Contiguous axial images were obtained from the base of the skull
through the vertex without intravenous contrast.

[Series 3: head without · axial · non-contrast · 0.43mm/px · z∈[-99,+21]mm · 7 of 33 slices shown, 9 images]
[im 5/33  brain]
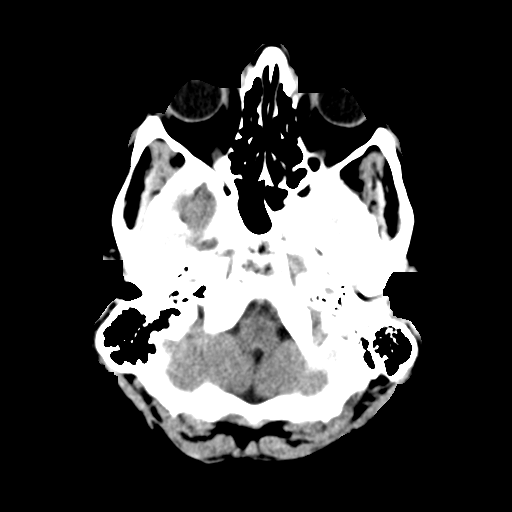
[im 5/33  bone]
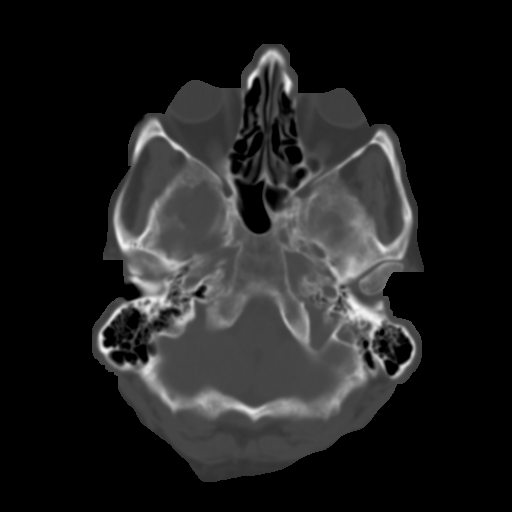
[im 9/33  brain]
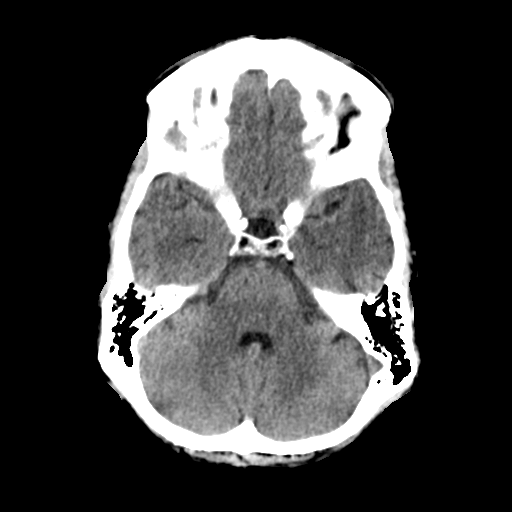
[im 13/33  brain]
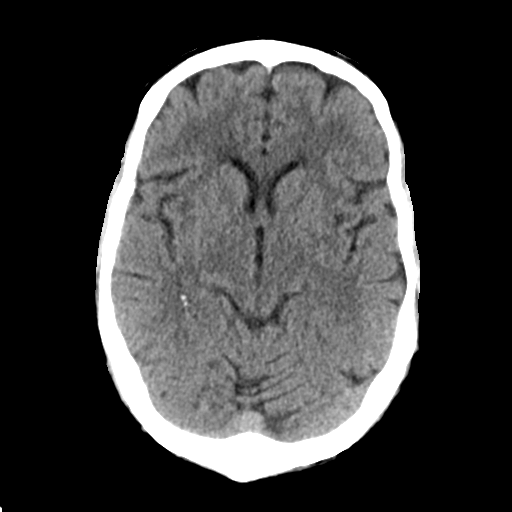
[im 17/33  brain]
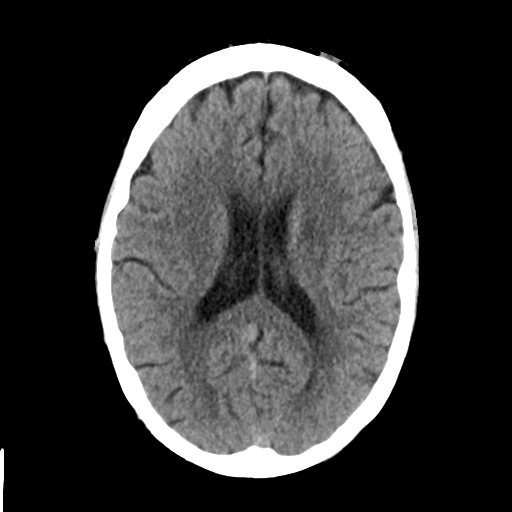
[im 21/33  brain]
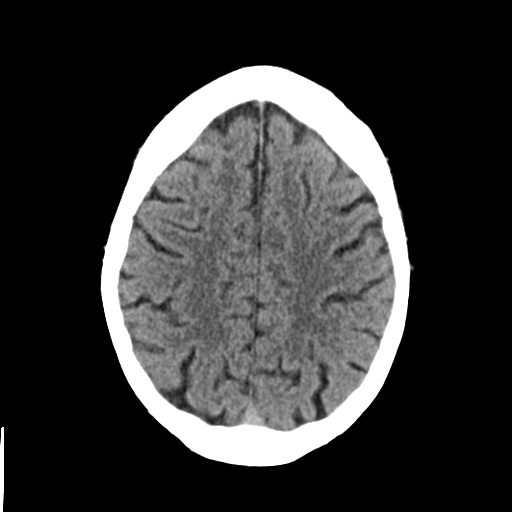
[im 21/33  bone]
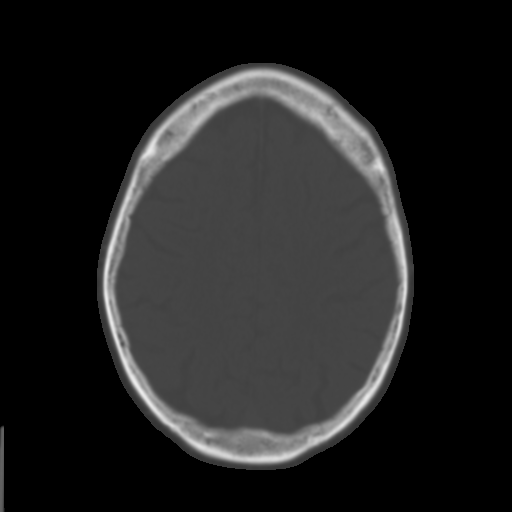
[im 25/33  brain]
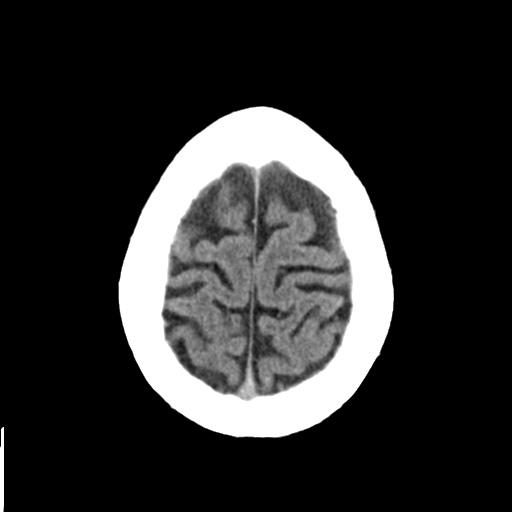
[im 29/33  brain]
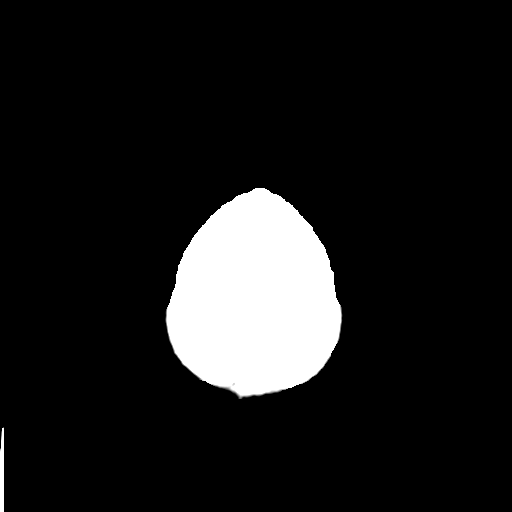

[Series 4: head bone · axial · 0.43mm/px · z∈[-103,-71]mm · 3 of 82 slices shown]
[im 9/82  bone]
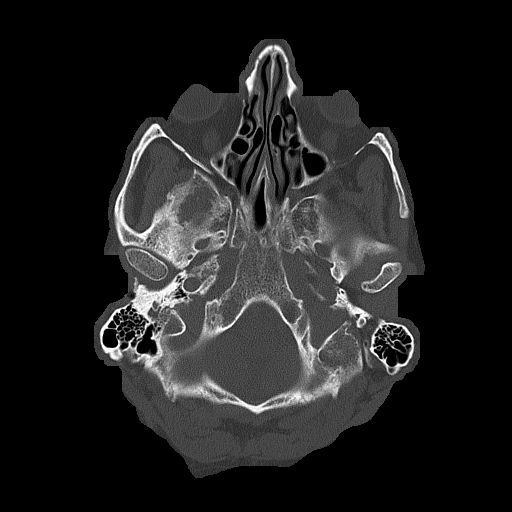
[im 17/82  bone]
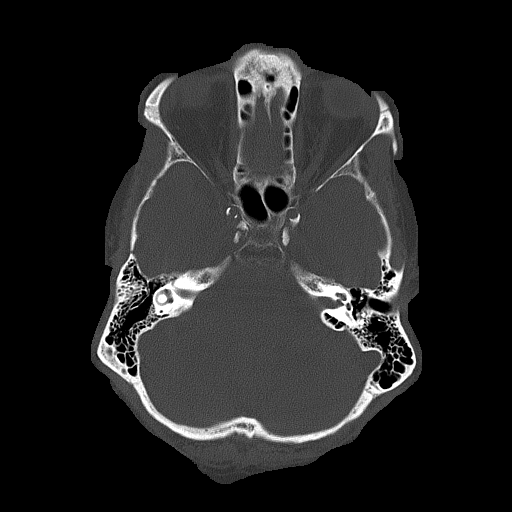
[im 25/82  bone]
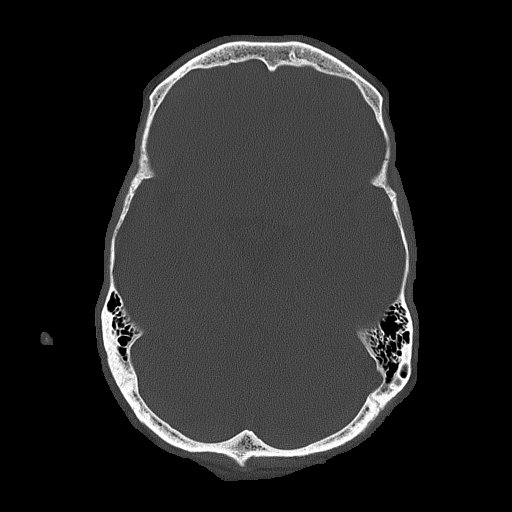

[Series 5: head without cor · coronal · non-contrast · 0.32mm/px · 3 of 67 slices shown]
[im 23/67  brain]
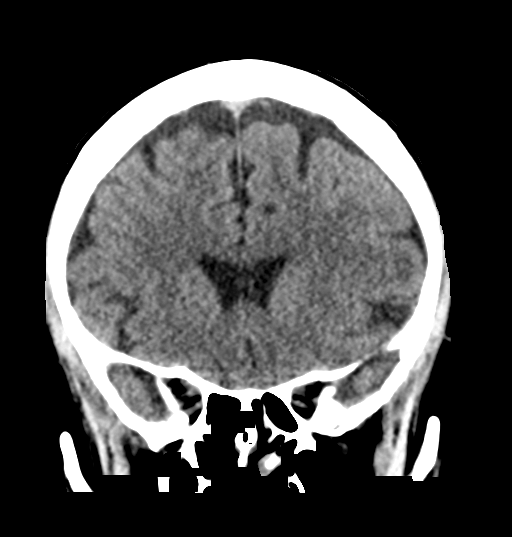
[im 30/67  brain]
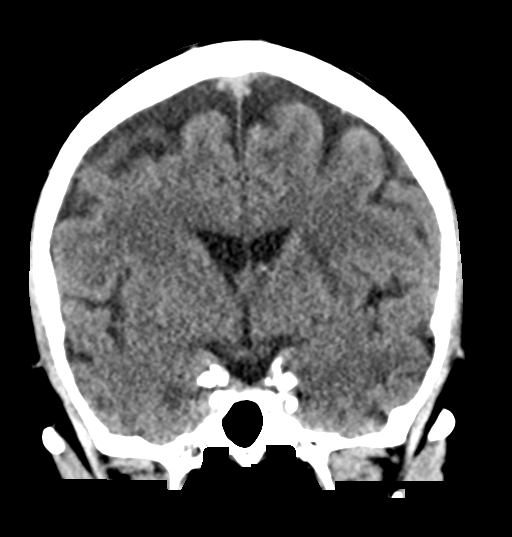
[im 37/67  brain]
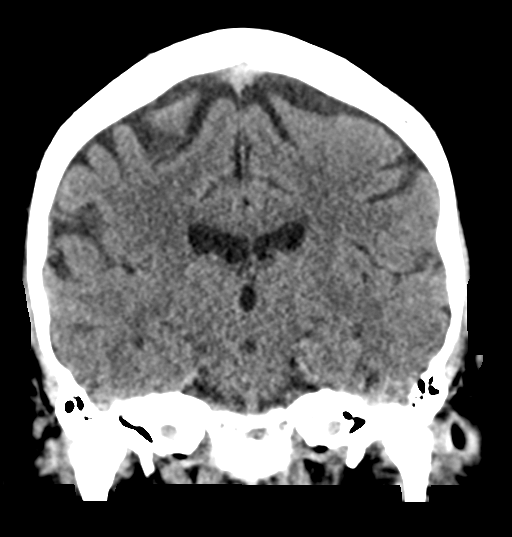

[Series 6: head without sag · sagittal · non-contrast · 0.35mm/px · 3 of 54 slices shown]
[im 18/54  brain]
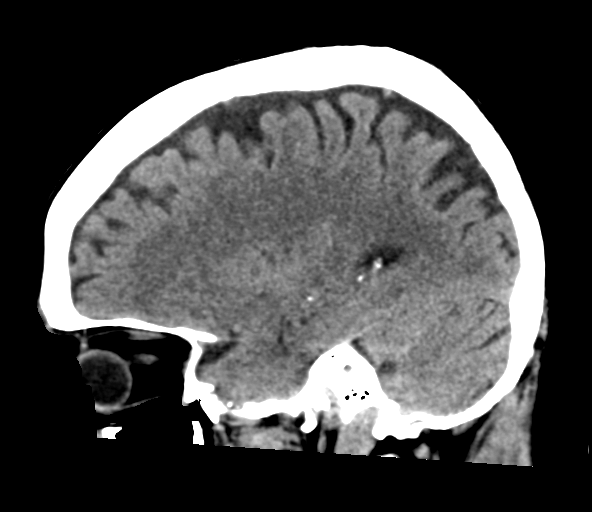
[im 27/54  brain]
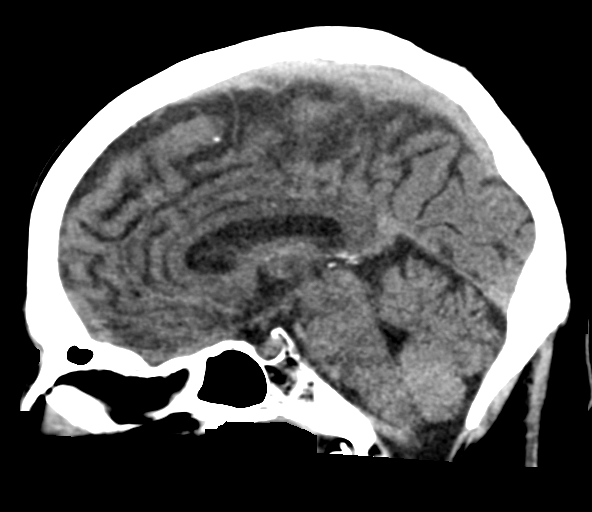
[im 36/54  brain]
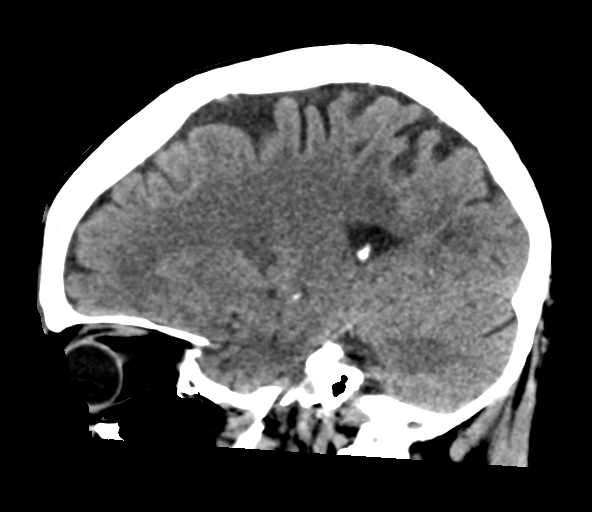

[16 of 47 positions shown; findings below may reference images not displayed]

FINDINGS: Brain: No subdural, epidural, or subarachnoid hemorrhage identified.
The cerebellum and brainstem are normal. Basal cisterns are patent.
There is a lacunar infarct in the external capsule on series 3,
image 13, stable. Another lacunar infarct on the left as seen on
series 3, image 14 not definitely seen on the previous study. White
matter changes extend superiorly into the corona radiata such as on
series 3, image 17, not definitely seen previously. No acute
cortical ischemia is identified. Ventricles and sulci are
unremarkable. No mass effect or midline shift.

Vascular: Calcified atherosclerosis is seen in the intracranial
carotids.

Skull: Normal. Negative for fracture or focal lesion.

Sinuses/Orbits: No acute finding.

Other: A subcutaneous nodule is seen over the left forehead on
series 3, image 17, of doubtful significance, likely a sebaceous
cyst or other benign skin lesion. Recommend clinical correlation.
Extracranial soft tissues are otherwise within normal limits.
IMPRESSION: 1. There are white matter changes in the left corona radiata and a
small lacunar infarct in the left basal ganglia, described above,
which were not definitely seen previously. However, the most recent
comparison is from [30] and the findings are otherwise age
indeterminate but could be nonacute. No other acute intracranial
abnormalities are noted.
2. A subcutaneous nodule over the left forehead is of doubtful
significance. Recommend clinical correlation.

## 2019-04-19 IMAGING — CT CT ANGIOGRAPHY NECK
3 of 7 series · 10 of 36 positions shown · IV contrast (omnipaque)
Comparison: Head CT earlier same day
COMPARISON: Head CT earlier same day

Addendum:
CLINICAL DATA: Speech disturbance. Right-sided weakness. Altered
mental status.

EXAM:
CT ANGIOGRAPHY HEAD AND NECK
TECHNIQUE: Multidetector CT imaging of the head and neck was performed using
the standard protocol during bolus administration of intravenous
contrast. Multiplanar CT image reconstructions and MIPs were
obtained to evaluate the vascular anatomy. Carotid stenosis
measurements (when applicable) are obtained utilizing NASCET
criteria, using the distal internal carotid diameter as the
denominator.
CONTRAST:  75mL OMNIPAQUE IOHEXOL 350 MG/ML SOLN

[Series 6: cta neck/head · axial · 0.70mm/px · z∈[-236,-110]mm · 2 of 176 slices shown]
[im 59/176  soft-tissue]
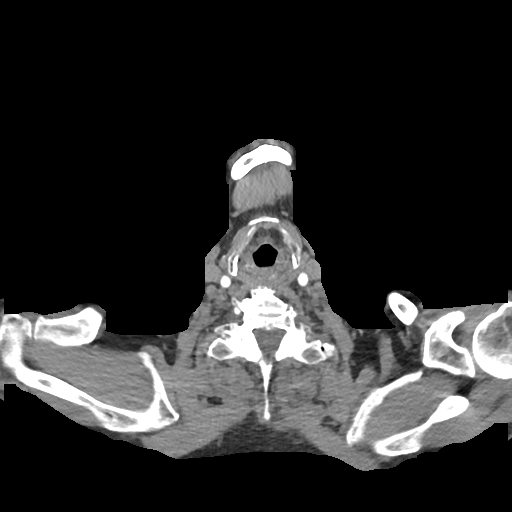
[im 117/176  soft-tissue]
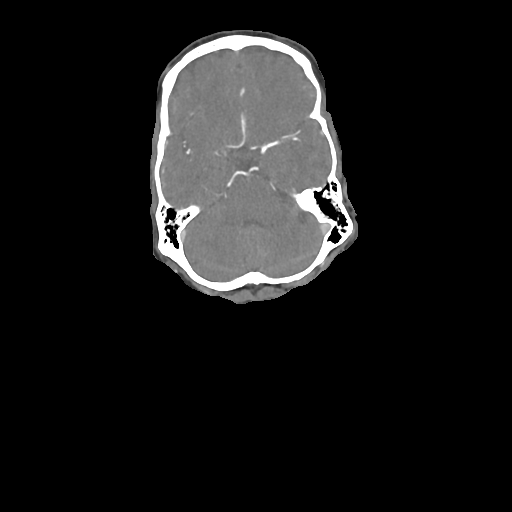

[Series 8: ax thins · axial · 0.39mm/px · z∈[-306,-44]mm · 6 of 368 slices shown]
[im 53/368  soft-tissue]
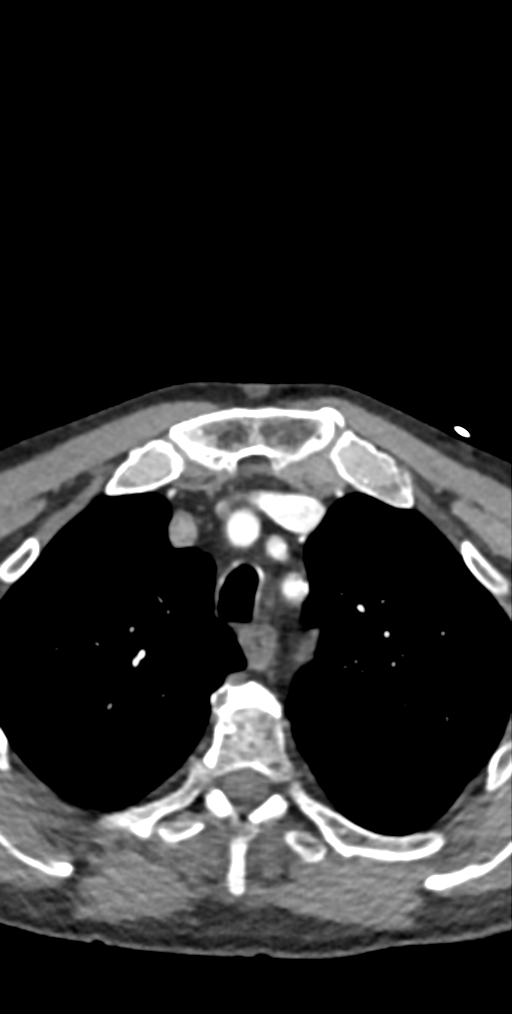
[im 105/368  bone]
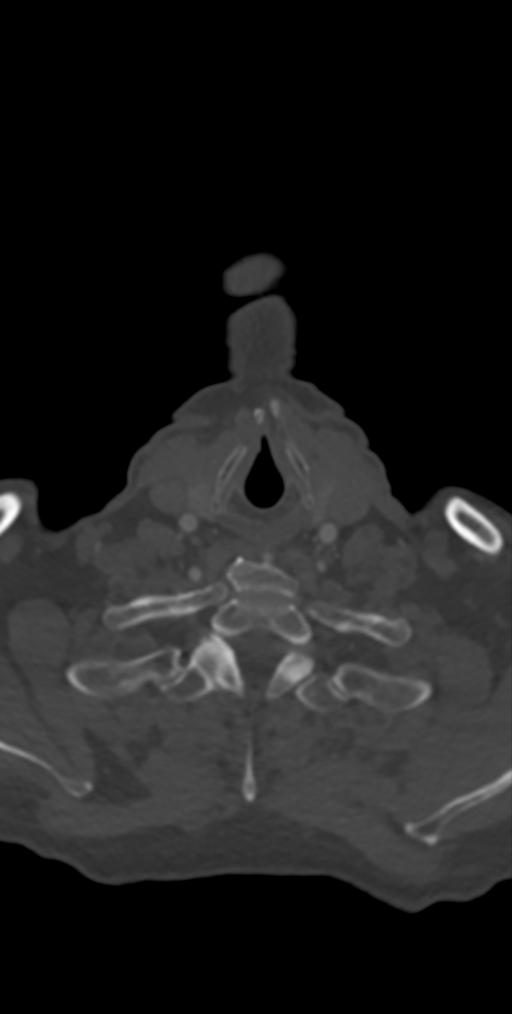
[im 158/368  soft-tissue]
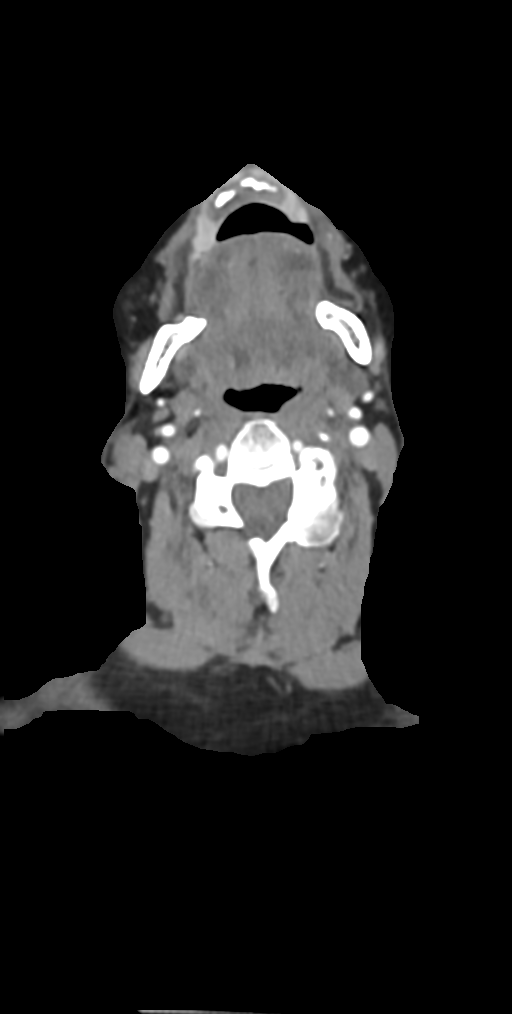
[im 210/368  bone]
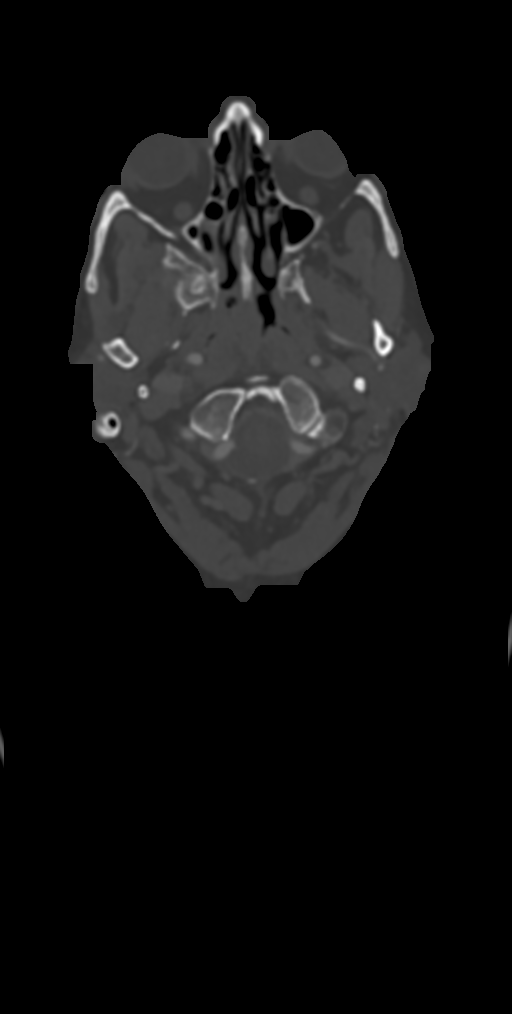
[im 263/368  soft-tissue]
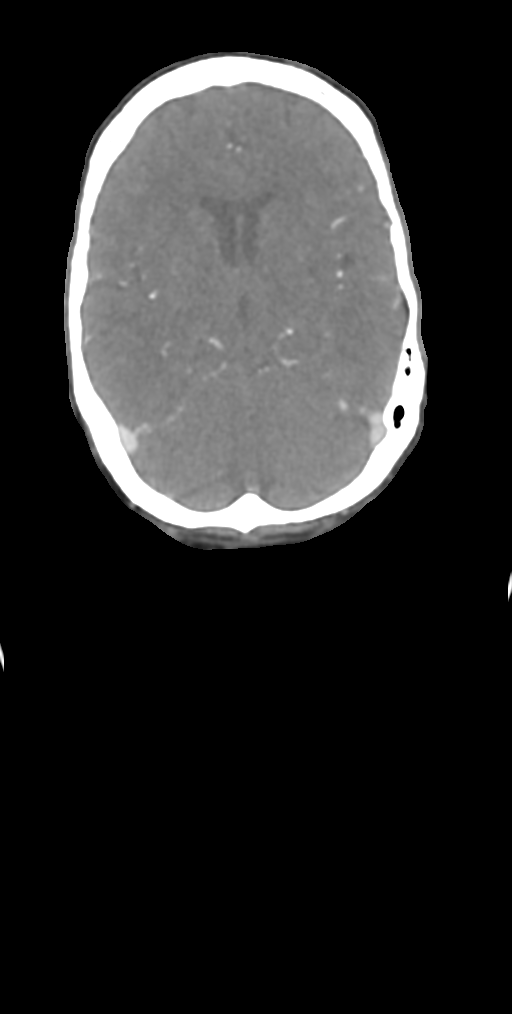
[im 315/368  bone]
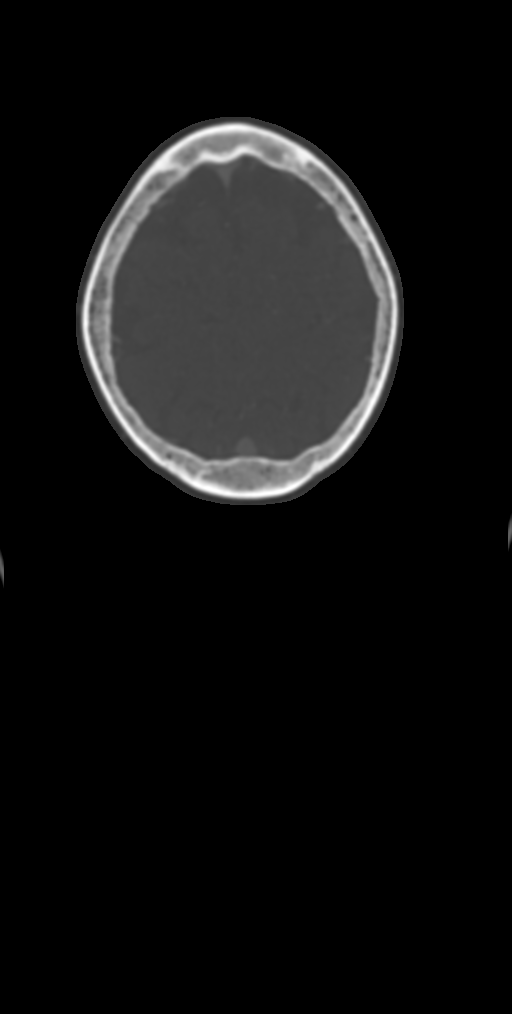

[Series 10: sag thins · sagittal · 0.74mm/px · 2 of 225 slices shown]
[im 18/225  soft-tissue]
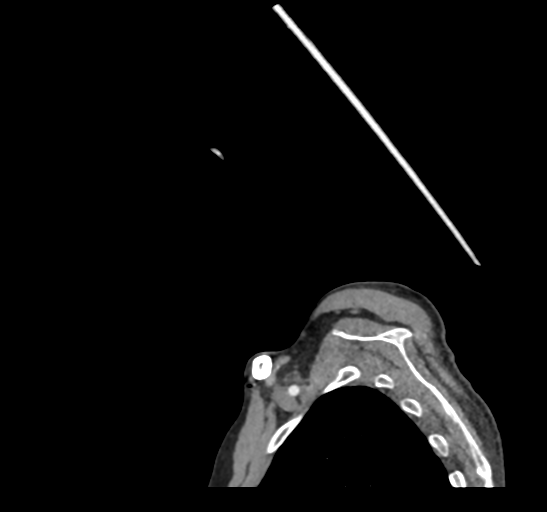
[im 208/225  soft-tissue]
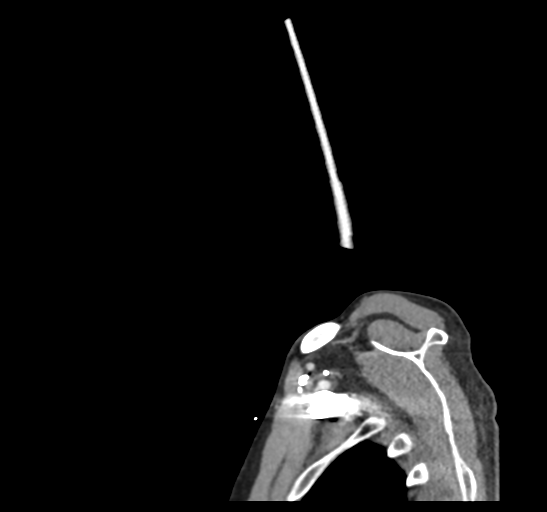

[10 of 36 positions shown; findings below may reference images not displayed]

FINDINGS: CTA NECK FINDINGS

Aortic arch: Mild aortic atherosclerosis. No aneurysm or dissection.
Branching pattern is normal.

Right carotid system: Common carotid artery widely patent to the
bifurcation. There is calcified plaque at the carotid bifurcation
and ICA bulb. Minimal diameter of the proximal ICA is 4 mm. Compared
to a more distal cervical diameter of 4 mm, there is no stenosis.

Left carotid system: Common carotid artery is widely patent to the
bifurcation region. There is calcified plaque at the carotid
bifurcation and ICA bulb. Minimal diameter is 4.0 mm. No stenosis.

Vertebral arteries: There is atherosclerotic plaque at the right
vertebral artery origin with 50% stenosis. No left vertebral artery
stenosis. Beyond the origins, both vertebral arteries are widely
patent to the foramen magnum.

Skeleton: Degenerative spondylosis.

Other neck: No mass or lymphadenopathy.

Upper chest: Emphysema and pulmonary scarring.  No active process

Review of the MIP images confirms the above findings

CTA HEAD FINDINGS

Anterior circulation: Both internal carotid arteries are patent
through the skull base. There is atherosclerotic change in both
carotid siphon regions. Stenosis is estimated at 50% on each side.
The anterior and middle cerebral vessels are patent without proximal
stenosis, aneurysm or vascular malformation. No large or medium
vessel occlusion is seen.

Posterior circulation: Both vertebral arteries are patent through
the foramen magnum to the basilar. No basilar stenosis. Posterior
circulation branch vessels show flow.

Venous sinuses: Patent and normal.

Anatomic variants: None significant.

Review of the MIP images confirms the above findings
IMPRESSION: Atherosclerotic disease at both carotid bifurcations, but no
stenosis when compared to the diameter of the more distal cervical
internal carotid arteries.

50% stenosis of the right vertebral artery origin. No other
posterior circulation stenosis in the neck.

Atherosclerotic disease in both carotid bifurcation regions with 50%
stenosis on each side.

No intracranial large or medium vessel occlusion.

ADDENDUM:
Speech recognition error in the initial report. Third impression
should read as follows.

Atherosclerotic disease in both carotid siphon regions with 50
percent stenosis on each side.

To clarify, there is no stenosis at the carotid bifurcation regions.

*** End of Addendum ***
FINDINGS: CTA NECK FINDINGS

Aortic arch: Mild aortic atherosclerosis. No aneurysm or dissection.
Branching pattern is normal.

Right carotid system: Common carotid artery widely patent to the
bifurcation. There is calcified plaque at the carotid bifurcation
and ICA bulb. Minimal diameter of the proximal ICA is 4 mm. Compared
to a more distal cervical diameter of 4 mm, there is no stenosis.

Left carotid system: Common carotid artery is widely patent to the
bifurcation region. There is calcified plaque at the carotid
bifurcation and ICA bulb. Minimal diameter is 4.0 mm. No stenosis.

Vertebral arteries: There is atherosclerotic plaque at the right
vertebral artery origin with 50% stenosis. No left vertebral artery
stenosis. Beyond the origins, both vertebral arteries are widely
patent to the foramen magnum.

Skeleton: Degenerative spondylosis.

Other neck: No mass or lymphadenopathy.

Upper chest: Emphysema and pulmonary scarring.  No active process

Review of the MIP images confirms the above findings

CTA HEAD FINDINGS

Anterior circulation: Both internal carotid arteries are patent
through the skull base. There is atherosclerotic change in both
carotid siphon regions. Stenosis is estimated at 50% on each side.
The anterior and middle cerebral vessels are patent without proximal
stenosis, aneurysm or vascular malformation. No large or medium
vessel occlusion is seen.

Posterior circulation: Both vertebral arteries are patent through
the foramen magnum to the basilar. No basilar stenosis. Posterior
circulation branch vessels show flow.

Venous sinuses: Patent and normal.

Anatomic variants: None significant.

Review of the MIP images confirms the above findings
IMPRESSION: Atherosclerotic disease at both carotid bifurcations, but no
stenosis when compared to the diameter of the more distal cervical
internal carotid arteries.

50% stenosis of the right vertebral artery origin. No other
posterior circulation stenosis in the neck.

Atherosclerotic disease in both carotid bifurcation regions with 50%
stenosis on each side.

No intracranial large or medium vessel occlusion.

## 2019-04-19 MED ORDER — MOMETASONE FURO-FORMOTEROL FUM 200-5 MCG/ACT IN AERO
2.0000 | INHALATION_SPRAY | Freq: Two times a day (BID) | RESPIRATORY_TRACT | Status: DC
  Administered 2019-04-19 – 2019-04-20 (×2): 2 via RESPIRATORY_TRACT
  Filled 2019-04-19: qty 8.8

## 2019-04-19 MED ORDER — METOPROLOL TARTRATE 5 MG/5ML IV SOLN: 2.5000 mg | Freq: Four times a day (QID) | INTRAVENOUS | Status: DC | PRN

## 2019-04-19 MED ORDER — ACETAMINOPHEN 325 MG PO TABS: 650.0000 mg | ORAL_TABLET | ORAL | Status: DC | PRN

## 2019-04-19 MED ORDER — NICOTINE 14 MG/24HR TD PT24
14.0000 mg | MEDICATED_PATCH | Freq: Every day | TRANSDERMAL | Status: DC
  Administered 2019-04-19 – 2019-04-20 (×2): 14 mg via TRANSDERMAL
  Filled 2019-04-19 (×2): qty 1

## 2019-04-19 MED ORDER — PREDNISONE 5 MG PO TABS
10.0000 mg | ORAL_TABLET | Freq: Every morning | ORAL | Status: DC
  Administered 2019-04-19 – 2019-04-20 (×2): 10 mg via ORAL
  Filled 2019-04-19 (×2): qty 2

## 2019-04-19 MED ORDER — PREDNISONE 5 MG PO TABS
5.0000 mg | ORAL_TABLET | Freq: Every day | ORAL | Status: DC
  Administered 2019-04-19: 5 mg via ORAL
  Filled 2019-04-19: qty 1

## 2019-04-19 MED ORDER — ASPIRIN 300 MG RE SUPP: 300.0000 mg | Freq: Every day | RECTAL | Status: DC

## 2019-04-19 MED ORDER — ACETAMINOPHEN 160 MG/5ML PO SOLN: 650.0000 mg | ORAL | Status: DC | PRN

## 2019-04-19 MED ORDER — IOHEXOL 350 MG/ML SOLN
75.0000 mL | Freq: Once | INTRAVENOUS | Status: AC | PRN
  Administered 2019-04-19: 13:00:00 75 mL via INTRAVENOUS

## 2019-04-19 MED ORDER — FOLIC ACID 1 MG PO TABS
1.0000 mg | ORAL_TABLET | Freq: Every day | ORAL | Status: DC
  Administered 2019-04-19 – 2019-04-20 (×2): 1 mg via ORAL
  Filled 2019-04-19 (×2): qty 1

## 2019-04-19 MED ORDER — ALBUTEROL SULFATE (2.5 MG/3ML) 0.083% IN NEBU: 2.5000 mg | INHALATION_SOLUTION | RESPIRATORY_TRACT | Status: DC | PRN

## 2019-04-19 MED ORDER — SENNOSIDES-DOCUSATE SODIUM 8.6-50 MG PO TABS: 1.0000 | ORAL_TABLET | Freq: Every evening | ORAL | Status: DC | PRN

## 2019-04-19 MED ORDER — ACETAMINOPHEN 650 MG RE SUPP: 650.0000 mg | RECTAL | Status: DC | PRN

## 2019-04-19 MED ORDER — SODIUM CHLORIDE 0.9 % IV SOLN
INTRAVENOUS | Status: DC
  Administered 2019-04-19: 19:00:00 via INTRAVENOUS

## 2019-04-19 MED ORDER — ATORVASTATIN CALCIUM 40 MG PO TABS
40.0000 mg | ORAL_TABLET | Freq: Every day | ORAL | Status: DC
  Administered 2019-04-19 – 2019-04-20 (×2): 40 mg via ORAL
  Filled 2019-04-19 (×2): qty 1

## 2019-04-19 MED ORDER — ENOXAPARIN SODIUM 40 MG/0.4ML ~~LOC~~ SOLN
40.0000 mg | SUBCUTANEOUS | Status: DC
  Administered 2019-04-19: 40 mg via SUBCUTANEOUS
  Filled 2019-04-19: qty 0.4

## 2019-04-19 MED ORDER — PANTOPRAZOLE SODIUM 40 MG PO TBEC
40.0000 mg | DELAYED_RELEASE_TABLET | Freq: Every day | ORAL | Status: DC
  Administered 2019-04-19 – 2019-04-20 (×2): 40 mg via ORAL
  Filled 2019-04-19 (×2): qty 1

## 2019-04-19 MED ORDER — ASPIRIN 325 MG PO TABS
325.0000 mg | ORAL_TABLET | Freq: Every day | ORAL | Status: DC
  Administered 2019-04-19 – 2019-04-20 (×2): 325 mg via ORAL
  Filled 2019-04-19 (×2): qty 1

## 2019-04-19 MED ORDER — STROKE: EARLY STAGES OF RECOVERY BOOK
Freq: Once | Status: AC
  Administered 2019-04-19: 22:00:00
  Filled 2019-04-19: qty 1

## 2019-04-19 MED ORDER — TAMSULOSIN HCL 0.4 MG PO CAPS
0.4000 mg | ORAL_CAPSULE | Freq: Every day | ORAL | Status: DC
  Administered 2019-04-19 – 2019-04-20 (×2): 0.4 mg via ORAL
  Filled 2019-04-19 (×2): qty 1

## 2019-04-19 NOTE — ED Triage Notes (Signed)
Pt stated that he has had stroke-like symptoms for the past 3 days. Rt sided weakness & slurred speech. Upon arrival to ED his VSS &  A/Ox4.

## 2019-04-19 NOTE — H&P (Addendum)
History and Physical    Drew Nguyen:[id number] DOB: 1956/06/16 DOA: 04/19/2019  PCP: Boyce Medici, FNP Consultants:  Loletha Carrow - GI; Elsworth Soho - pulmonology Patient coming from:  Home - lives with 3 children (6, 75, and 64 years old); NOK: [place], [place], [phone]  Chief Complaint: Stroke-like symptoms  HPI: Drew Nguyen is a 63 y.o. male with medical history significant of RA; HTN; and COPD presenting with stroke-like symptoms.  He reports difficult speaking.  His brother reports sluggish speech, fatigue, not sleeping well.  He has 3 young children he cares for, his girlfriend is "more of a burden than help, has mental issues herself."  Symptoms started 3 days ago, his brother was concerned was mental fatigue and has been trying to help the last few days.  He didn't get better and is leaning to one side and so decided he should be seen.  +dysphagia - he had an operation for that not too long ago, said he had candidal esophagitis as well as a "different type" stomach infection that sounds like H pylori, was on antibiotics for a month.  The swallow has gotten worse in the last week.  No numbness/weakness/tingling of arms and legs.  He is not sure when he last used cocaine, "it might have been 3 days ago."  He reports that he doesn't have a drug addiction.  He uses cocaine to calm his nerves.     ED Course: Stroke on CT - symptoms started 3 days ago with ataxia and dysarthria.  Review of Systems: As per HPI; otherwise review of systems reviewed and negative.   Ambulatory Status:  Ambulates without assistance  Past Medical History:  Diagnosis Date   COPD (chronic obstructive pulmonary disease) ([place])    Current smoker    Depression    Hypertension    RA (rheumatoid arthritis) ([place])    Tobacco abuse     Past Surgical History:  Procedure Laterality Date   ANKLE SURGERY     MULTIPLE TOOTH EXTRACTIONS      Social History   Socioeconomic History   Marital status: Single      Spouse name: Not on file   Number of children: Not on file   Years of education: Not on file   Highest education level: Not on file  Occupational History   Occupation: retired  Scientist, product/process development strain: Not on file   Food insecurity    Worry: Not on file    Inability: Not on Lexicographer needs    Medical: Not on file    Non-medical: Not on file  Tobacco Use   Smoking status: Current Every Day Smoker    Packs/day: 1.50    Years: 48.00    Pack years: 72.00    Types: Cigarettes   Smokeless tobacco: Never Used   Tobacco comment: 10/25/18 1 to 1 1/2 ppd  Substance and Sexual Activity   Alcohol use: Yes    Comment: occ   Drug use: Yes    Types: Cocaine, Benzodiazepines   Sexual activity: Not on file  Lifestyle   Physical activity    Days per week: Not on file    Minutes per session: Not on file   Stress: Not on file  Relationships   Social connections    Talks on phone: Not on file    Gets together: Not on file    Attends religious service: Not on file    Active member of  club or organization: Not on file    Attends meetings of clubs or organizations: Not on file    Relationship status: Not on file   Intimate partner violence    Fear of current or ex partner: Not on file    Emotionally abused: Not on file    Physically abused: Not on file    Forced sexual activity: Not on file  Other Topics Concern   Not on file  Social History Narrative   Not on file    Allergies  Allergen Reactions   Celexa [Citalopram] Other (See Comments)    Over sedation Sleeps too much    Family History  Problem Relation Age of Onset   Hypertension Mother    Heart attack Mother    Heart attack Father    Brain cancer Brother    Colon cancer Maternal Aunt    Stomach cancer Neg Hx    Esophageal cancer Neg Hx    Pancreatic cancer Neg Hx     Prior to Admission medications   Medication Sig Start Date End Date Taking? Authorizing  Provider  acetaminophen (TYLENOL) 500 MG tablet Take 500 mg by mouth every 6 (six) hours as needed.    [provider]  citalopram (CELEXA) 10 MG tablet Take 10 mg by mouth daily. 12/22/18   [provider]  fluconazole (DIFLUCAN) 100 MG tablet Take 1 tablet (100 mg total) by mouth daily. 01/30/19   Doran Stabler, MD  folic acid (FOLVITE) 1 MG tablet Take 1 mg by mouth daily.    [provider]  Glycopyrrolate-Formoterol (BEVESPI AEROSPHERE) 9-4.8 MCG/ACT AERO Inhale 2 puffs into the lungs 2 (two) times daily. This is not a rescue inhaler.  Take this daily no matter what. 11/08/18   Lauraine Rinne, NP  meloxicam (MOBIC) 7.5 MG tablet TAKE 1 TABLET BY MOUTH 1 TO 2 TIMES DAILY 08/22/18   [provider]  omeprazole (PRILOSEC) 20 MG capsule TAKE 1 CAPSULE BY MOUTH IN THE MORNING 09/26/18   [provider]  omeprazole (PRILOSEC) 20 MG capsule Take 1 capsule (20 mg total) by mouth 2 (two) times daily for 10 days. 02/03/19 02/13/19  Nelida Meuse III, MD  predniSONE (DELTASONE) 10 MG tablet TAKE 1 TABLET BY MOUTH IN THE MORNING AND 1 2 (ONE HALF) IN THE EVENING 08/23/18   [provider]  PROAIR HFA 108 (90 Base) MCG/ACT inhaler TO INHALE 1 TO 2 PUFFS EVERY 4 TO 6 HOURS AS NEEDED FOR COUGH WHEEZE OR SHORTNESS OF BREATH 07/15/18   [provider]  tamsulosin (FLOMAX) 0.4 MG CAPS capsule TAKE 1 CAPSULE BY MOUTH ONCE DAILY FOR URINARY TRACT 07/11/18   [provider]  TURMERIC PO Take by mouth.    [provider]    Physical Exam: Vitals:   04/19/19 1030 04/19/19 1045 04/19/19 1100 04/19/19 1115  BP: (!) 160/81 (!) 168/88 (!) 174/76 (!) 185/87  Pulse: (!) 50 (!) 53 (!) 50 62  Resp: 15 15 14 13   Temp:      TempSrc:      SpO2: 97% 97% 97% 99%  Weight:      Height:          General:  Appears older than stated age, emotionally labile, strong smell of cigarette smoke  Eyes:   EOMI, normal lids, iris  ENT:  grossly normal  hearing, lips & tongue, mmm; artificial dentition  Neck:  no LAD, masses or thyromegaly; no carotid bruits  Cardiovascular:  RRR, no m/r/g. No LE edema.   Respiratory:   CTA bilaterally with no wheezes/rales/rhonchi.  Normal respiratory effort.  Abdomen:  soft, NT, ND, NABS  Back:   normal alignment, no CVAT  Skin:  no rash or induration seen on limited exam  Musculoskeletal:  grossly normal tone BUE/BLE, good ROM, no bony abnormality  Psychiatric:  Emotionally labile mood and affect, speech dysarthric with expressive aphasia and he is clearly frustrated by this, AOx3  Neurologic:  CN 2-12 grossly intact, moves all extremities in coordinated fashion, sensation intact    Radiological Exams on Admission: Ct Head Wo Contrast  Result Date: 04/19/2019 CLINICAL DATA:  Right-sided weakness and slurred speech. Altered mental status. EXAM: CT HEAD WITHOUT CONTRAST TECHNIQUE: Contiguous axial images were obtained from the base of the skull through the vertex without intravenous contrast. COMPARISON:  May 03, 2010 FINDINGS: Brain: No subdural, epidural, or subarachnoid hemorrhage identified. The cerebellum and brainstem are normal. Basal cisterns are patent. There is a lacunar infarct in the external capsule on series 3, image 13, stable. Another lacunar infarct on the left as seen on series 3, image 14 not definitely seen on the previous study. White matter changes extend superiorly into the corona radiata such as on series 3, image 17, not definitely seen previously. No acute cortical ischemia is identified. Ventricles and sulci are unremarkable. No mass effect or midline shift. Vascular: Calcified atherosclerosis is seen in the intracranial carotids. Skull: Normal. Negative for fracture or focal lesion. Sinuses/Orbits: No acute finding. Other: A subcutaneous nodule is seen over the left forehead on series 3, image 17, of doubtful significance, likely a sebaceous cyst or other benign skin  lesion. Recommend clinical correlation. Extracranial soft tissues are otherwise within normal limits. IMPRESSION: 1. There are white matter changes in the left corona radiata and a small lacunar infarct in the left basal ganglia, described above, which were not definitely seen previously. However, the most recent comparison is from 2011 and the findings are otherwise age indeterminate but could be nonacute. No other acute intracranial abnormalities are noted. 2. A subcutaneous nodule over the left forehead is of doubtful significance. Recommend clinical correlation. Electronically Signed   By: Dorise Bullion III M.D   On: 04/19/2019 10:38    EKG: Independently reviewed.  NSR with rate 76; LAFB, nonspecific ST changes with no evidence of acute ischemia; NSCSLT   Labs on Admission: I have personally reviewed the available labs and imaging studies at the time of the admission.  Pertinent labs:   K+ 3.2 WBC 14.1 UA WNL UDS: + BZD, cocaine   Assessment/Plan Principal Problem:   CVA (cerebral vascular accident) ([place]) Active Problems:   Tobacco abuse   COPD GOLD I, still smoking   Rheumatoid arthritis ([place])   Dysphagia   Essential hypertension   Cocaine abuse ([place])   Adjustment disorder   CVA -Patient with expressive aphasia and dysarthria for several days, concerning for CVA -Head CT confirms CVA -Will admit for further evaluation -Telemetry monitoring -MRI -CTA head/neck -Echo -Risk stratification with FLP, A1c; will also check TSH -Will start ASA daily -Neurology consult -PT/OT/ST/Nutrition Consults -Depending on his progress, he may benefit from CIR  HTN -Allow permissive HTN for now -Treat BP only if >220/120, and then with goal of 15% reduction -His BP is markedly elevated in the ER and he does not appear to be taking BP medications at home and is likely to need these   HLD -Check FLP -Will start empiric  Lipitor 40 mg daily  Dysphagia -6/11 EGD with thrush and H  pylori, both treated -He appears to have chronic mild dysphagia, but possibly worse now due to CVA -Will do ST swallow evaluation rather than bedside swallow evaluation -He also was found to have a laryngeal nodule and was referred to ENT, but it is not clear if he went to this appointment  RA -Takes BID prednisone  COPD -Continue Combivent, Symbicort, prn Albuterol HFA -Does not appear to have an exacerbation at this time  Tobacco dependence -Encourage cessation.   -This was discussed with the patient and should be reviewed on an ongoing basis.   -Patch ordered at patient request.  Cocaine abuse -Patient denies that this is a problem for him, but was quite vague in his history about use (reported he wasn't sure of last use, maybe 2 weeks ago but then suggested maybe just a few days, etc). -Encouraged cessation and discussed the vasoconstrictive properties of cocaine -Ongoing counseling is encouraged  Adjustment d/o -Patient has significant life stressors with young children in his home (despite his advanced age) and a girlfriend with mental illness -He was quite emotionally labile, but this may be due to the stroke -For now, monitor - but if not improving would consider psychiatric consultation as inpatient vs. Outpatient.    Note: This patient has been tested and is pending for the novel coronavirus COVID-19.   DVT prophylaxis:  Lovenox  Code Status: Full  Family Communication: Brother present throughout evaluation Disposition Plan:  Home once clinically improved Consults called: Neurology; PT/OT/ST/Nutrition  Admission status: Admit - It is my clinical opinion that admission to INPATIENT is reasonable and necessary because of the expectation that this patient will require hospital care that crosses at least 2 midnights to treat this condition based on the medical complexity of the problems presented.  Given the aforementioned information, the predictability of an adverse outcome  is felt to be significant.    Karmen Bongo MD [facility]   How to contact the [facility] Attending or Consulting provider [place] or covering provider during after hours [place], for this patient?  1. Check the care team in [facility] and look for a) attending/consulting [facility] provider listed and b) the [facility] team listed 2. Log into [email or web address] and use [place]'s universal password to access. If you do not have the password, please contact the hospital operator. 3. Locate the [facility] provider you are looking for under [facility] and page to a number that you can be directly reached. 4. If you still have difficulty reaching the provider, please page the [facility] (Director on Call) for the Hospitalists listed on amion for assistance.   04/19/2019, 12:49 PM

## 2019-04-19 NOTE — ED Notes (Signed)
Attempted report x1. 

## 2019-04-19 NOTE — ED Provider Notes (Signed)
[facility] EMERGENCY DEPARTMENT Provider Note   CSN: [id number] Arrival date & time: 04/19/19  3875     History   Chief Complaint Chief Complaint  Patient presents with  . Stroke-like symptoms    HPI Drew Nguyen is a 63 y.o. male.     63 year old male presents with 3 days of dysarthria as well as ataxia with stumbling to the right.  No headache.  Patient states that he does have some upper extremity weakness.  He denies any visual changes.  States that he has trouble getting the words out.  Denies any new medications.  Notes that he has had trouble grasping things but is unsure which hand it is worse with.  No reported facial droop.  No reported confusion.  Denies any emesis.  Symptoms have been persistent.  He is concerned that he may have had a stroke and that is why came at this time.     Past Medical History:  Diagnosis Date  . COPD (chronic obstructive pulmonary disease) ([place])   . Current smoker   . Depression   . Hypertension   . RA (rheumatoid arthritis) ([place])   . Tobacco abuse     Patient Active Problem List   Diagnosis Date Noted  . Dysphagia 10/25/2018  . Tobacco abuse 09/13/2018  . COPD GOLD I, still smoking 09/13/2018  . Rheumatoid arthritis ([place]) 09/13/2018    Past Surgical History:  Procedure Laterality Date  . ANKLE SURGERY    . MULTIPLE TOOTH EXTRACTIONS          Home Medications    Prior to Admission medications   Medication Sig Start Date End Date Taking? Authorizing Provider  acetaminophen (TYLENOL) 500 MG tablet Take 500 mg by mouth every 6 (six) hours as needed.    [provider]  citalopram (CELEXA) 10 MG tablet Take 10 mg by mouth daily. 12/22/18   [provider]  fluconazole (DIFLUCAN) 100 MG tablet Take 1 tablet (100 mg total) by mouth daily. 01/30/19   Doran Stabler, MD  folic acid (FOLVITE) 1 MG tablet Take 1 mg by mouth daily.    [provider]  Glycopyrrolate-Formoterol  (BEVESPI AEROSPHERE) 9-4.8 MCG/ACT AERO Inhale 2 puffs into the lungs 2 (two) times daily. This is not a rescue inhaler.  Take this daily no matter what. 11/08/18   Lauraine Rinne, NP  meloxicam (MOBIC) 7.5 MG tablet TAKE 1 TABLET BY MOUTH 1 TO 2 TIMES DAILY 08/22/18   [provider]  omeprazole (PRILOSEC) 20 MG capsule TAKE 1 CAPSULE BY MOUTH IN THE MORNING 09/26/18   [provider]  omeprazole (PRILOSEC) 20 MG capsule Take 1 capsule (20 mg total) by mouth 2 (two) times daily for 10 days. 02/03/19 02/13/19  Nelida Meuse III, MD  predniSONE (DELTASONE) 10 MG tablet TAKE 1 TABLET BY MOUTH IN THE MORNING AND 1 2 (ONE HALF) IN THE EVENING 08/23/18   [provider]  PROAIR HFA 108 (90 Base) MCG/ACT inhaler TO INHALE 1 TO 2 PUFFS EVERY 4 TO 6 HOURS AS NEEDED FOR COUGH WHEEZE OR SHORTNESS OF BREATH 07/15/18   [provider]  tamsulosin (FLOMAX) 0.4 MG CAPS capsule TAKE 1 CAPSULE BY MOUTH ONCE DAILY FOR URINARY TRACT 07/11/18   [provider]  TURMERIC PO Take by mouth.    [provider]    Family History Family History  Problem Relation Age of Onset  . Hypertension Mother   . Heart  attack Mother   . Heart attack Father   . Brain cancer Brother   . Colon cancer Maternal Aunt   . Stomach cancer Neg Hx   . Esophageal cancer Neg Hx   . Pancreatic cancer Neg Hx     Social History Social History   Tobacco Use  . Smoking status: Current Every Day Smoker    Packs/day: 1.50    Years: 48.00    Pack years: 72.00    Types: Cigarettes  . Smokeless tobacco: Never Used  . Tobacco comment: 10/25/18 1 to 1 1/2 ppd  Substance Use Topics  . Alcohol use: Yes    Comment: occ  . Drug use: No     Allergies   Patient has no known allergies.   Review of Systems Review of Systems  All other systems reviewed and are negative.    Physical Exam Updated Vital Signs BP (!) 167/94 (BP Location: Right Arm)   Pulse 60   Temp 97.9 F (36.6 C) (Oral)    Resp 13   Ht 1.676 m (5\' 6" )   Wt 63.5 kg   SpO2 99%   BMI 22.60 kg/m   Physical Exam Vitals signs and nursing note reviewed.  Constitutional:      General: He is not in acute distress.    Appearance: Normal appearance. He is well-developed. He is not toxic-appearing.  HENT:     Head: Normocephalic and atraumatic.  Eyes:     General: Lids are normal.     Conjunctiva/sclera: Conjunctivae normal.     Pupils: Pupils are equal, round, and reactive to light.  Neck:     Musculoskeletal: Normal range of motion and neck supple.     Thyroid: No thyroid mass.     Trachea: No tracheal deviation.  Cardiovascular:     Rate and Rhythm: Normal rate and regular rhythm.     Heart sounds: Normal heart sounds. No murmur. No gallop.   Pulmonary:     Effort: Pulmonary effort is normal. No respiratory distress.     Breath sounds: Normal breath sounds. No stridor. No decreased breath sounds, wheezing, rhonchi or rales.  Abdominal:     General: Bowel sounds are normal. There is no distension.     Palpations: Abdomen is soft.     Tenderness: There is no abdominal tenderness. There is no rebound.  Musculoskeletal: Normal range of motion.        General: No tenderness.  Skin:    General: Skin is warm and dry.     Findings: No abrasion or rash.  Neurological:     Mental Status: He is alert and oriented to person, place, and time.     GCS: GCS eye subscore is 4. GCS verbal subscore is 5. GCS motor subscore is 6.     Cranial Nerves: Dysarthria and facial asymmetry present. No cranial nerve deficit.     Sensory: No sensory deficit.     Motor: Weakness present. No tremor.     Coordination: Finger-Nose-Finger Test normal.  Psychiatric:        Speech: Speech normal.        Behavior: Behavior normal.      ED Treatments / Results  Labs (all labs ordered are listed, but only abnormal results are displayed) Labs Reviewed  ETHANOL  PROTIME-INR  APTT  CBC  DIFFERENTIAL  COMPREHENSIVE METABOLIC  PANEL  RAPID URINE DRUG SCREEN, HOSP PERFORMED  URINALYSIS, ROUTINE W REFLEX MICROSCOPIC  I-STAT CHEM 8, ED  EKG EKG Interpretation  Date/Time:  Saturday April 19 2019 08:34:03 EDT Ventricular Rate:  76 PR Interval:    QRS Duration: 89 QT Interval:  396 QTC Calculation: 446 R Axis:   -45 Text Interpretation:  Sinus rhythm Right atrial enlargement Left anterior fascicular block Probable anteroseptal infarct, old No significant change since last tracing Confirmed by Lacretia Leigh ([id number]) on 04/19/2019 9:52:33 AM   Radiology No results found.  Procedures Procedures (including critical care time)  Medications Ordered in ED Medications - No data to display   Initial Impression / Assessment and Plan / ED Course  I have reviewed the triage vital signs and the nursing notes.  Pertinent labs & imaging results that were available during my care of the patient were reviewed by me and considered in my medical decision making (see chart for details).        Case discussed with Dr. Cheral Marker from neurology who will see the patient in consultation.  Requested patient have an MRI and discuss with Dr. Lorin Mercy from hospital service will admit Final Clinical Impressions(s) / ED Diagnoses   Final diagnoses:  None    ED Discharge Orders    None       Lacretia Leigh, MD 04/19/19 1154

## 2019-04-19 NOTE — ED Notes (Signed)
Patient in X-Ray at this time. Unable to collect blood work.

## 2019-04-19 NOTE — ED Notes (Signed)
EDP at bedside  

## 2019-04-19 NOTE — Progress Notes (Signed)
Pt oriented to room  Call light within reach  Bed in lowest position and alarm set Assisted pt in ordering dinner  Vitals documented and set pt up on tele  Pt denies pain Will continue to monitor

## 2019-04-19 NOTE — Consult Note (Addendum)
Referring Physician: Dr. Zenia Resides    Chief Complaint: Ataxia and speech deficit  HPI: Drew Nguyen is a 63 y.o. male presenting with right sided weakness and slurred speech for the past 3 days. Also has had impaired gait with stumbling to the right. He endorses that the speech is not just slurred, but also involves difficulty with word finding and new onset stuttering of speech. He was concerned that he was having a stroke and decided to come to the ED for evaluation. His brother accompanies him here. He has no prior history of stroke.   CT head:  1. There are white matter changes in the left corona radiata and a small lacunar infarct in the left basal ganglia, described above, which were not definitely seen previously. However, the most recent comparison is from 2011 and the findings are otherwise age indeterminate but could be nonacute. No other acute intracranial abnormalities are noted. 2. A subcutaneous nodule over the left forehead is of doubtful significance. Recommend clinical correlation.   CTA head and neck: Atherosclerotic disease at both carotid bifurcations, but no stenosis when compared to the diameter of the more distal cervical internal carotid arteries. 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 No other posterior circulation stenosis in the neck. Atherosclerotic disease in both carotid  bifurcation regions with 50% stenosis on each side. No intracranial large or medium vessel occlusion.   LSN: 3 days prior to presentation tPA Given: No: Out of time window  Past Medical History:  Diagnosis Date  . COPD (chronic obstructive pulmonary disease) ([place])   . Current smoker   . Depression   . Hypertension   . RA (rheumatoid arthritis) ([place])   . Tobacco abuse     Past Surgical History:  Procedure Laterality Date  . ANKLE SURGERY    . MULTIPLE TOOTH EXTRACTIONS      Family History  Problem Relation Age of Onset  . Hypertension Mother   . Heart attack Mother   .  Heart attack Father   . Brain cancer Brother   . Colon cancer Maternal Aunt   . Stomach cancer Neg Hx   . Esophageal cancer Neg Hx   . Pancreatic cancer Neg Hx    Social History:  reports that he has been smoking cigarettes. He has a 72.00 pack-year smoking history. He has never used smokeless tobacco. He reports current alcohol use. He reports that he does not use drugs.  Allergies: No Known Allergies  Home Medications:    ROS: Has joint pain. Has been under a lot of stress recently due to partner's mental illness. No visual changes or headache. Denies confusion. No facial droop. Other ROS as per HPI.   Physical Examination: Blood pressure (!) 185/87, pulse 62, temperature 97.9 F (36.6 C), temperature source Oral, resp. rate 13, height 5\' 6"  (1.676 m), weight 63.5 kg, SpO2 99 %.  HEENT: [place]/AT. Subcutaneous nodule to left forehead is noted Lungs: Respirations unlabored Ext: No edema  Neurologic Examination: Mental Status: Alert, fully oriented. Dysthymic affect. Speech with dysarthria and intermittent stuttering quality but no errors of grammar or syntax.  Naming and repetition intact. Able to follow a 3 step directional command without difficulty. Cranial Nerves: II:  Visual fields intact with no extinction to DSS. PERRL.  III,IV, VI: EOMI without nystagmus. No ptosis.  V,VII: No facial droop. Facial temp sensation equal bilaterally VIII: hearing intact to voice IX,X: No hoarseness XI: Slight lag with shoulder shrug on the right.  XII: midline tongue  extension  Motor: Right : Upper extremity   5/5    Left:     Upper extremity   5/5  Lower extremity   5/5     Lower extremity   5/5 The LLE is subtly/equivocally with less strength than on the right.  Decreased bulk to left calf muscles relative to the right.  Sensory: Temp and light touch intact throughout, bilaterally. No extinction.  Deep Tendon Reflexes:  1+ bilateral brachioradialis, biceps and patellae Plantars: Right:  Downgoing   Left: Upgoing Cerebellar: No ataxia with FNF bilaterally. There is dysmetria with RAM bilterally Gait: Deferred  Results for orders placed or performed during the hospital encounter of 04/19/19 (from the past 48 hour(s))  Protime-INR     Status: None   Collection Time: 04/19/19  9:09 AM  Result Value Ref Range   Prothrombin Time 12.0 11.4 - 15.2 seconds   INR 0.9 0.8 - 1.2    Comment: (NOTE) INR goal varies based on device and disease states. Performed at [facility], [place] [address] [place], [place] [address]   APTT     Status: None   Collection Time: 04/19/19  9:09 AM  Result Value Ref Range   aPTT 28 24 - 36 seconds    Comment: Performed at [facility] [address] [place], [place] [address]  CBC     Status: Abnormal   Collection Time: 04/19/19  9:09 AM  Result Value Ref Range   WBC 14.1 (H) 4.0 - 10.5 K/uL   RBC 4.11 (L) 4.22 - 5.81 MIL/uL   Hemoglobin 13.7 13.0 - 17.0 g/dL   HCT 40.0 39.0 - 52.0 %   MCV 97.3 80.0 - 100.0 fL   MCH 33.3 26.0 - 34.0 pg   MCHC 34.3 30.0 - 36.0 g/dL   RDW 13.4 11.5 - 15.5 %   Platelets 341 150 - 400 K/uL   nRBC 0.0 0.0 - 0.2 %    Comment: Performed at [facility], [place] [address], [place], [place] [address]  Differential     Status: Abnormal   Collection Time: 04/19/19  9:09 AM  Result Value Ref Range   Neutrophils Relative % 83 %   Neutro Abs 11.7 (H) 1.7 - 7.7 K/uL   Lymphocytes Relative 9 %   Lymphs Abs 1.3 0.7 - 4.0 K/uL   Monocytes Relative 6 %   Monocytes Absolute 0.9 0.1 - 1.0 K/uL   Eosinophils Relative 1 %   Eosinophils Absolute 0.2 0.0 - 0.5 K/uL   Basophils Relative 0 %   Basophils Absolute 0.1 0.0 - 0.1 K/uL   Immature Granulocytes 1 %   Abs Immature Granulocytes 0.07 0.00 - 0.07 K/uL    Comment: Performed at [facility] [address] [facility] [address] [place], [place] [address]  Comprehensive metabolic panel     Status: Abnormal   Collection Time: 04/19/19  9:09 AM  Result Value Ref Range    Sodium 139 135 - 145 mmol/L   Potassium 3.2 (L) 3.5 - 5.1 mmol/L   Chloride 105 98 - 111 mmol/L   CO2 26 22 - 32 mmol/L   Glucose, Bld 99 70 - 99 mg/dL   BUN 14 8 - 23 mg/dL   Creatinine, Ser 0.95 0.61 - 1.24 mg/dL   Calcium 8.9 8.9 - 10.3 mg/dL   Total Protein 6.3 (L) 6.5 - 8.1 g/dL   Albumin 3.6 3.5 - 5.0 g/dL   AST 17 15 - 41  U/L   ALT 22 0 - 44 U/L   Alkaline Phosphatase 58 38 - 126 U/L   Total Bilirubin 0.8 0.3 - 1.2 mg/dL   GFR calc non Af Amer >60 >60 mL/min   GFR calc Af Amer >60 >60 mL/min   Anion gap 8 5 - 15    Comment: Performed at [facility] [address], [place], [place] [address]  Urine rapid drug screen (hosp performed)     Status: Abnormal   Collection Time: 04/19/19  9:30 AM  Result Value Ref Range   Opiates NONE DETECTED NONE DETECTED   Cocaine POSITIVE (A) NONE DETECTED   Benzodiazepines POSITIVE (A) NONE DETECTED   Amphetamines NONE DETECTED NONE DETECTED   Tetrahydrocannabinol NONE DETECTED NONE DETECTED   Barbiturates NONE DETECTED NONE DETECTED    Comment: (NOTE) DRUG SCREEN FOR MEDICAL PURPOSES ONLY.  IF CONFIRMATION IS NEEDED FOR ANY PURPOSE, NOTIFY LAB WITHIN 5 DAYS. LOWEST DETECTABLE LIMITS FOR URINE DRUG SCREEN Drug Class                     Cutoff (ng/mL) Amphetamine and metabolites    1000 Barbiturate and metabolites    200 Benzodiazepine                 962 Tricyclics and metabolites     300 Opiates and metabolites        300 Cocaine and metabolites        300 THC                            50 Performed at [facility], [place] [address] [place], [place] [address]   Urinalysis, Routine w reflex microscopic     Status: None   Collection Time: 04/19/19  9:30 AM  Result Value Ref Range   Color, Urine YELLOW YELLOW   APPearance CLEAR CLEAR   Specific Gravity, Urine 1.012 1.005 - 1.030   pH 6.0 5.0 - 8.0   Glucose, UA NEGATIVE NEGATIVE mg/dL   Hgb urine dipstick NEGATIVE NEGATIVE   Bilirubin Urine NEGATIVE NEGATIVE    Ketones, ur NEGATIVE NEGATIVE mg/dL   Protein, ur NEGATIVE NEGATIVE mg/dL   Nitrite NEGATIVE NEGATIVE   Leukocytes,Ua NEGATIVE NEGATIVE    Comment: Performed at [name] [address] [place], [place] [address]   Ct Head Wo Contrast  Result Date: 04/19/2019 CLINICAL DATA:  Right-sided weakness and slurred speech. Altered mental status. EXAM: CT HEAD WITHOUT CONTRAST TECHNIQUE: Contiguous axial images were obtained from the base of the skull through the vertex without intravenous contrast. COMPARISON:  May 03, 2010 FINDINGS: Brain: No subdural, epidural, or subarachnoid hemorrhage identified. The cerebellum and brainstem are normal. Basal cisterns are patent. There is a lacunar infarct in the external capsule on series 3, image 13, stable. Another lacunar infarct on the left as seen on series 3, image 14 not definitely seen on the previous study. White matter changes extend superiorly into the corona radiata such as on series 3, image 17, not definitely seen previously. No acute cortical ischemia is identified. Ventricles and sulci are unremarkable. No mass effect or midline shift. Vascular: Calcified atherosclerosis is seen in the intracranial carotids. Skull: Normal. Negative for fracture or focal lesion. Sinuses/Orbits: No acute finding. Other: A subcutaneous nodule is seen over the left forehead on series 3, image 17, of doubtful significance, likely a sebaceous cyst or other benign skin  lesion. Recommend clinical correlation. Extracranial soft tissues are otherwise within normal limits. IMPRESSION: 1. There are white matter changes in the left corona radiata and a small lacunar infarct in the left basal ganglia, described above, which were not definitely seen previously. However, the most recent comparison is from 2011 and the findings are otherwise age indeterminate but could be nonacute. No other acute intracranial abnormalities are noted. 2. A subcutaneous nodule over the left  forehead is of doubtful significance. Recommend clinical correlation. Electronically Signed   By: Dorise Bullion III M.D   On: 04/19/2019 10:38   MRI brain: Acute infarction affecting the left putamen and radiating white matter tracts. No mass effect or hemorrhage.  Mild chronic small-vessel ischemic change elsewhere affecting the cerebral hemispheric white matter.  Assessment: 63 y.o. 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  1. Exam reveals dysarthria, mild expressive dysphasia, upgoing left toe, ataxic RAM bilaterally and LLE exhibiting subtly less strength than on the right.    2. CT head reveals white matter changes in the left corona radiata and a small lacunar infarct in the left basal ganglia, which were not definitely seen on the prior CT from 2011. The findings may be subacute. MRI confirms an acute infarction within the left putamen and adjacent white matter tracts.  3. Stroke Risk Factors - smoking, cocaine use and HTN 4. UTox was positive for cocaine and benzodiazepine. Reportedly was using cocaine recently 5. Psychosocial stressors at home. Per patient, his significant other has schizophrenia, behaves erratically, has frequent rage attacks and is unable to fully contribute to household needs and the raising of their 3 children  Plan: 1. HgbA1c, fasting lipid panel 2. MRA of the brain without contrast 3. PT consult, OT consult, Speech consult 4. Echocardiogram 5. Carotid dopplers 6. Prophylactic therapy- Start ASA 81 mg po qd 7. Risk factor modification 8. Telemetry monitoring 9. Frequent neuro checks 10. Start atorvastatin 40 mg po qd. Obtain baseline CK level.  11. BP management. Out of permissive HTN time window.  12. Case Management consult regarding possible mental health referral for the patient's wife, who is reportedly has schizophrenia and is noncompliant with medications   @Electronically   signed: Dr. Kerney Elbe  04/19/2019, 11:34 AM

## 2019-04-19 NOTE — Evaluation (Signed)
Clinical/Bedside Swallow Evaluation Patient Details  Name: Drew Nguyen MRN: [id number] Date of Birth: 08/11/1956  Today's Date: 04/19/2019 Time: SLP Start Time (ACUTE ONLY): 1627 SLP Stop Time (ACUTE ONLY): 1656 SLP Time Calculation (min) (ACUTE ONLY): 29 min  Past Medical History:  Past Medical History:  Diagnosis Date  . COPD (chronic obstructive pulmonary disease) ([name])   . Current smoker   . Depression   . Hypertension   . RA (rheumatoid arthritis) ([place])   . Tobacco abuse    Past Surgical History:  Past Surgical History:  Procedure Laterality Date  . ANKLE SURGERY    . MULTIPLE TOOTH EXTRACTIONS     HPI:  Drew Nguyen is a 63 y.o. male with medical history significant of RA; HTN; and COPD presenting with stroke-like symptoms.  He reports difficult speaking.  His brother reports sluggish speech, fatigue, not sleeping well. +dysphagia - he had an operation for that not too long ago, said he had candidal esophagitis as well as a "different type" stomach infection that sounds like H pylori, was on antibiotics for a month.  The swallow has gotten worse in the last week.  No numbness/weakness/tingling of arms and legs.  He is not sure when he last used cocaine, "it might have been 3 days ago."  He reports that he doesn't have a drug addiction.  He uses cocaine to calm his nerves.  MRI 8/29: "Acute infarction affecting the left putamen and radiating white matter tracts."  CXR 8/29: no active disease   Assessment / Plan / Recommendation Clinical Impression  Pt's clinical presentation and description of dysphagia symptoms are consistent with esophageal dysphagia.  Pt reports recent history of esophageal endoscopy with concern for candida infection.  Findings included laryngeal edema, thrush in oropharynx, esophageal plaques were found consistent with candidiasis, tortuous esophagus, gastritis.  Pt was also given medicaiton for stomach infection.  His symptoms improved, but have returned as  of about a week ago.  There were no overt s/s of aspiration with any consistencies trialed, but pt complained of globus sensation, and was noted to grunt in an attempt to dislodge bolus trials.  Pt appears safe for regular texture diet with thin liquids.  Recommend GI consult to determine if esophageal symptoms can be managed, or if further assessment is needed.  Recommend follow up re: ENT referral. SLP Visit Diagnosis: Dysphagia, pharyngoesophageal phase (R13.14)    Aspiration Risk  No limitations    Diet Recommendation Regular;Thin liquid   Liquid Administration via: Cup Medication Administration: Whole meds with liquid Supervision: Patient able to self feed Postural Changes: Seated upright at 90 degrees;Remain upright for at least 30 minutes after po intake    Other  Recommendations Recommended Consults: Consider ENT evaluation;Consider GI evaluation Oral Care Recommendations: Oral care BID   Follow up Recommendations          Swallow Study   General Date of Onset: 04/19/19 HPI: Drew Nguyen is a 63 y.o. male with medical history significant of RA; HTN; and COPD presenting with stroke-like symptoms.  He reports difficult speaking.  His brother reports sluggish speech, fatigue, not sleeping well. +dysphagia - he had an operation for that not too long ago, said he had candidal esophagitis as well as a "different type" stomach infection that sounds like H pylori, was on antibiotics for a month.  The swallow has gotten worse in the last week.  No numbness/weakness/tingling of arms and legs.  He is not sure when he last used  cocaine, "it might have been 3 days ago."  He reports that he doesn't have a drug addiction.  He uses cocaine to calm his nerves.  MRI 8/29: "Acute infarction affecting the left putamen and radiating white matter tracts."  CXR 8/29: no active disease Type of Study: Bedside Swallow Evaluation Previous Swallow Assessment: EGD 6/11, esophageal candidiasis. Laryngeal edema.   Referral to ENT, pt awaiting call. Diet Prior to this Study: NPO Respiratory Status: Room air Behavior/Cognition: Alert;Cooperative;Pleasant mood Oral Cavity Assessment: Within Functional Limits Oral Cavity - Dentition: Dentures, top(could not place bottome dentures without adhesive) Vision: Functional for self-feeding Self-Feeding Abilities: Able to feed self Patient Positioning: Upright in bed Baseline Vocal Quality: Normal Volitional Cough: Strong Volitional Swallow: Able to elicit    Oral/Motor/Sensory Function Overall Oral Motor/Sensory Function: Within functional limits   Ice Chips Ice chips: Not tested   Thin Liquid Thin Liquid: Within functional limits Other Comments: globus sensation    Nectar Thick Nectar Thick Liquid: Not tested   Honey Thick Honey Thick Liquid: Not tested   Puree Puree: Within functional limits   Solid     Solid: Within functional limits Other Comments: globus sensation      Drew Nguyen, Drew Nguyen, [place] Office: [phone] 04/19/2019,5:22 PM

## 2019-04-20 ENCOUNTER — Inpatient Hospital Stay (HOSPITAL_COMMUNITY): Payer: Medicaid Other

## 2019-04-20 DIAGNOSIS — I10 Essential (primary) hypertension: Secondary | ICD-10-CM

## 2019-04-20 DIAGNOSIS — M069 Rheumatoid arthritis, unspecified: Secondary | ICD-10-CM

## 2019-04-20 LAB — LIPID PANEL
Cholesterol: 151 mg/dL (ref 0–200)
HDL: 73 mg/dL (ref >40)
LDL Cholesterol: 62 mg/dL (ref 0–99)
Total CHOL/HDL Ratio: 2.1 RATIO
Triglycerides: 78 mg/dL (ref <150)
VLDL: 16 mg/dL (ref 0–40)

## 2019-04-20 LAB — ECHOCARDIOGRAM COMPLETE
Height: 66 in
Weight: 2240 oz

## 2019-04-20 LAB — HIV ANTIBODY (ROUTINE TESTING W REFLEX): HIV Screen 4th Generation wRfx: NONREACTIVE

## 2019-04-20 LAB — HEMOGLOBIN A1C
Hgb A1c MFr Bld: 5.1 % (ref 4.8–5.6)
Mean Plasma Glucose: 99.67 mg/dL

## 2019-04-20 MED ORDER — POTASSIUM CHLORIDE CRYS ER 20 MEQ PO TBCR
20.0000 meq | EXTENDED_RELEASE_TABLET | Freq: Three times a day (TID) | ORAL | Status: DC
  Administered 2019-04-20: 20 meq via ORAL
  Filled 2019-04-20: qty 1

## 2019-04-20 MED ORDER — HYDROCHLOROTHIAZIDE 12.5 MG PO CAPS: 12.5000 mg | ORAL_CAPSULE | Freq: Every day | ORAL | 11 refills | Status: DC

## 2019-04-20 MED ORDER — NICOTINE 14 MG/24HR TD PT24: 14.0000 mg | MEDICATED_PATCH | Freq: Every day | TRANSDERMAL | 0 refills | Status: DC

## 2019-04-20 MED ORDER — CLOPIDOGREL BISULFATE 75 MG PO TABS: 75.0000 mg | ORAL_TABLET | Freq: Every day | ORAL | 0 refills | Status: DC

## 2019-04-20 MED ORDER — AMLODIPINE BESYLATE 5 MG PO TABS: 5.0000 mg | ORAL_TABLET | Freq: Every day | ORAL | 11 refills | Status: AC

## 2019-04-20 MED ORDER — ASPIRIN EC 81 MG PO TBEC: 81.0000 mg | DELAYED_RELEASE_TABLET | Freq: Every day | ORAL | 11 refills | Status: DC

## 2019-04-20 NOTE — Progress Notes (Signed)
  Echocardiogram 2D Echocardiogram has been performed.  Drew Nguyen 04/20/2019, 4:45 PM

## 2019-04-20 NOTE — Evaluation (Signed)
Occupational Therapy Evaluation Patient Details Name: Drew Nguyen MRN: [id number] DOB: [birth year] Today's Date: 04/20/2019    History of Present Illness Drew Nguyen is a 63 y.o. 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 Pt with dysarthria, ataxia. Pt with medical history significant of RA; HTN; and COPD presenting with stroke-like symptoms. Pt reports psychosocial issues with GF and her schizophrenia with rages.   Clinical Impression   Pt PTA: living at home with 3 children under 15 and assisting with all of their needs. Pt reports independence with ADL and mobility, drives. Pt currently limited by decreased cognition in short term memory and problem solving. Pt given SBT: 8/28 with questionable impairment with underlying dementia disorder. Pt with decreased recall after 3 mins and required verbal cueing to problem solve through a memory task. Pt supervisionA to modified independence with ADL. No physical focal deficits noted this eval. Pt supervision level for mobility in room. Pt would benefit from continued OT skilled services for ADL, mobility and safety. OT following acutely for ADLs related cognitive tasks.    Follow Up Recommendations  Home health OT;Outpatient OT(for higher level cognitive tasks)    Equipment Recommendations  None recommended by OT    Recommendations for Other Services       Precautions / Restrictions Precautions Precautions: Fall Restrictions Weight Bearing Restrictions: No      Mobility Bed Mobility Overal bed mobility: Modified Independent                Transfers Overall transfer level: Needs assistance   Transfers: Sit to/from Stand Sit to Stand: Min guard         General transfer comment: [email or web address] for initial standing balance    Balance Overall balance assessment: Needs assistance Sitting-balance support: Feet supported Sitting balance-Leahy Scale: Good     Standing balance  support: No upper extremity supported Standing balance-Leahy Scale: Fair                             ADL either performed or assessed with clinical judgement   ADL Overall ADL's : At baseline                                       General ADL Comments: SupervisionA level for possibility of unsteadiness with mobility.     Vision Baseline Vision/History: Wears glasses Wears Glasses: Reading only Patient Visual Report: No change from baseline Vision Assessment?: No apparent visual deficits;Yes Eye Alignment: Within Functional Limits Ocular Range of Motion: Within Functional Limits Alignment/Gaze Preference: Within Defined Limits Additional Comments: wears glasses, appears, WFLs- able to scan and read stroke booklet     Perception Perception Comments: sensation with eyes closed intact   Praxis      Pertinent Vitals/Pain Pain Assessment: Faces Faces Pain Scale: Hurts a little bit Pain Location: arthritis "all over" Pain Descriptors / Indicators: Discomfort Pain Intervention(s): Limited activity within patient's tolerance     Hand Dominance Right   Extremity/Trunk Assessment Upper Extremity Assessment Upper Extremity Assessment: Generalized weakness   Lower Extremity Assessment Lower Extremity Assessment: Generalized weakness   Cervical / Trunk Assessment Cervical / Trunk Assessment: Normal   Communication Communication Communication: Expressive difficulties(slow to initiate at times and pt reports "it isn't back yet")   Cognition Arousal/Alertness: Awake/alert Behavior During Therapy: WFL for tasks assessed/performed Overall  Cognitive Status: Impaired/Different from baseline Area of Impairment: Problem solving;Memory                     Memory: Decreased short-term memory       Problem Solving: Slow processing General Comments: Pt given SBT: 8/28 with questionable impairment with underlying dementia disorder. Pt with  decreased recall after 3 mins and required verbal cueing to problem solve through a memory task.   General Comments  Generalized weakness and short term memory with problem solving deficits.    Exercises     Shoulder Instructions      Home Living Family/patient expects to be discharged to:: Private residence Living Arrangements: Children(37.87,54 year olds) Available Help at Discharge: Family;Available 24 hours/day Type of Home: House Home Access: Level entry     Home Layout: One level     Bathroom Shower/Tub: Occupational psychologist: Handicapped height     Home Equipment: Environmental consultant - 2 wheels;Wheelchair - manual;Bedside commode          Prior Functioning/Environment Level of Independence: Independent        Comments: Driving, retired, takes care of all 3 children        OT Problem List: Decreased strength;Decreased activity tolerance;Impaired balance (sitting and/or standing);Decreased cognition;Impaired UE functional use      OT Treatment/Interventions: Self-care/ADL training;Therapeutic exercise;Neuromuscular education;Therapeutic activities;Patient/family education;Balance training    OT Goals(Current goals can be found in the care plan section) ADL Goals Additional ADL Goal #1: Pt to increase sustained attention to x6 mins with no verbal cues to attend. Additional ADL Goal #2: Pt to increase to 90% accuracy with higher level cognitive task.  OT Frequency: Min 2X/week   Barriers to D/C: Decreased caregiver support  3 children available under 15 to assist- only assist.       Co-evaluation              AM-PAC OT "6 Clicks" Daily Activity     Outcome Measure Help from another person eating meals?: None Help from another person taking care of personal grooming?: None Help from another person toileting, which includes using toliet, bedpan, or urinal?: A Little Help from another person bathing (including washing, rinsing, drying)?: A Little Help  from another person to put on and taking off regular upper body clothing?: None Help from another person to put on and taking off regular lower body clothing?: None 6 Click Score: 22   End of Session Equipment Utilized During Treatment: Rolling walker;Gait belt Nurse Communication: Mobility status  Activity Tolerance: Patient tolerated treatment well Patient left: in chair;with call bell/phone within reach;with chair alarm set  OT Visit Diagnosis: Unsteadiness on feet (R26.81);Muscle weakness (generalized) (M62.81);Cognitive communication deficit (R41.841) Symptoms and signs involving cognitive functions: Cerebral infarction                Time: 6378-5885 OT Time Calculation (min): 36 min Charges:  OT General Charges $OT Visit: 1 Visit OT Evaluation $OT Eval Moderate Complexity: 1 Mod OT Treatments $Self Care/Home Management : 8-22 mins  Ebony Hail Harold Hedge) Marsa Aris OTR/L Acute Rehabilitation Services Pager: [phone] Office: [place] 04/20/2019, 12:03 PM

## 2019-04-20 NOTE — Discharge Summary (Addendum)
Physician Discharge Summary  Drew Nguyen DGU:[id number] DOB: [birth year] DOA: 04/19/2019  PCP: Boyce Medici, FNP  Admit date: 04/19/2019 Discharge date: 04/20/2019  Admitted From: Home  Disposition:  Home   Recommendations for Outpatient Follow-up:  1. Follow up with PCP in 1 week 2. Please obtain BMP/CBC in one week 3. Please follow up with Neurology in 4-6 weeks 4. Consider alternatives to prednisone for rheumatoid arthritis given hypertension     Home Health: None  Equipment/Devices: None  Discharge Condition: Good  CODE STATUS: FULL Diet recommendation: Cardiac  Brief/Interim Summary: Drew Nguyen is a 63 y.o. M with RA on prednisone, HTN poorly controlled and COPD still actively smoking who presented with subacute neurological deficits.  The patient was in his usual state of health until 3 days prior to admission when he developed right-sided weakness, slurred speech, impaired gait with stumbling to the right, difficulty with word finding, and stuttering.  In the ER, CT showed a white matter changes suspicious for stroke in the left basal ganglia.  The patient was outside the TPA window.     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 -Echocardiogram showed no cardiogenic source of embolism -Lipids ordered: LDL less than 70 -Aspirin ordered at admission --> discharged on aspirin and Plavix for 21 days, then aspirin indefinitely -Atrial fibrillation: not present on telemetry -tPA not given because outside the window at time of presentation -Dysphagia screen ordered in ER -PT eval ordered: recommended no PT follow up -Smoking cessation: encouraged, modalities discussed, discharged with nicotine patch    Hypertensive urgency Started  on amlodipine and hydrochlorothiazide -Close PCP follow-up  Rheumatoid arthritis -Consider alternatives to prednisone, given hypertension  COPD No acute disease  Smoking Smoking cessation strongly encouraged, modalities discussed  Cocaine use Substance use strongly discouraged.      Discharge Instructions  Discharge Instructions    Ambulatory referral to Neurology   Complete by: As directed    An appointment is requested in approximately: 4 weeks For new stroke   Diet - low sodium heart healthy   Complete by: As directed    Discharge instructions   Complete by: As directed    From Dr. Loleta Books:  You were admitted for a small stroke. Thankfully, you do not have any significant left over weakness or speech problems from it.  The Neurologists and I recommend three things: 1. STOP SMOKING, do not use cocaine, crack or amphetamines 2. Take aspirin and Plavix for 3 weeks, then stop Plavix and take aspirin 81 mg for the rest of your life 3. Make sure your blood pressure is controlled.  You do NOT need to take a cholesterol medicine, contrary to what we talked about, because your bad cholesterol is actually _62_ not 100.  Aspirin and Plavix are medicines to prevent clots.  Again, take these together for three weeks, then take aspirin alone.   For your blood pressure, take amlodipine 5 mg and HCTZ 12.5 mg once daily Call your primary care doctor for a follow up appointment in 1 week. Have him check your labs on the new blood pressure medicines and check your blood pressure to make sure they work   The Neurologists will call you for an appointment in 4-6 weeks.  If you haven't heard from them, call them to follow up (  info is listed below in To Do section)   Increase activity slowly   Complete by: As directed      Allergies as of 04/20/2019      Reactions   Celexa [citalopram] Other (See Comments)   Over sedation Sleeps too much      Medication List    TAKE these  medications   acetaminophen 500 MG tablet Commonly known as: TYLENOL Take 500 mg by mouth every 6 (six) hours as needed for mild pain, fever or headache.   amLODipine 5 MG tablet Commonly known as: NORVASC Take 1 tablet (5 mg total) by mouth daily.   aspirin EC 81 MG tablet Take 1 tablet (81 mg total) by mouth daily.   clopidogrel 75 MG tablet Commonly known as: Plavix Take 1 tablet (75 mg total) by mouth daily for 21 days.   Combivent Respimat 20-100 MCG/ACT Aers respimat Generic drug: Ipratropium-Albuterol Inhale 1 puff into the lungs 4 (four) times daily.   folic acid 1 MG tablet Commonly known as: FOLVITE Take 1 mg by mouth daily.   hydrochlorothiazide 12.5 MG capsule Commonly known as: Microzide Take 1 capsule (12.5 mg total) by mouth daily.   nicotine 14 mg/24hr patch Commonly known as: NICODERM CQ - dosed in mg/24 hours Place 1 patch (14 mg total) onto the skin daily. Start taking on: April 21, 2019   omeprazole 20 MG capsule Commonly known as: PRILOSEC Take 1 capsule (20 mg total) by mouth 2 (two) times daily for 10 days.   predniSONE 10 MG tablet Commonly known as: DELTASONE Take 5-10 mg by mouth See admin instructions. 10mg  in the morning and 5mg  in the evening   ProAir HFA 108 (90 Base) MCG/ACT inhaler Generic drug: albuterol Inhale 1-2 puffs into the lungs every 4 (four) hours as needed for wheezing or shortness of breath.   Symbicort 160-4.5 MCG/ACT inhaler Generic drug: budesonide-formoterol Inhale 2 puffs into the lungs 2 (two) times daily.   tamsulosin 0.4 MG Caps capsule Commonly known as: FLOMAX Take 0.4 mg by mouth daily.       Allergies  Allergen Reactions  . Celexa [Citalopram] Other (See Comments)    Over sedation Sleeps too much    Consultations:  Neurology   Procedures/Studies: Ct Angio Head W Or Wo Contrast  Addendum Date: 04/19/2019   ADDENDUM REPORT: 04/19/2019 14:36 ADDENDUM: Speech recognition error in the initial  report. Third impression should read as follows. Atherosclerotic disease in both carotid siphon regions with 50 percent stenosis on each side. To clarify, there is no stenosis at the carotid bifurcation regions. Electronically Signed   By: Nelson Chimes M.D.   On: 04/19/2019 14:36   Result Date: 04/19/2019 CLINICAL DATA:  Speech disturbance. Right-sided weakness. Altered mental status. EXAM: CT ANGIOGRAPHY HEAD AND NECK TECHNIQUE: Multidetector CT imaging of the head and neck was performed using the standard protocol during bolus administration of intravenous contrast. Multiplanar CT image reconstructions and MIPs were obtained to evaluate the vascular anatomy. Carotid stenosis measurements (when applicable) are obtained utilizing NASCET criteria, using the distal internal carotid diameter as the denominator. CONTRAST:  33mL OMNIPAQUE IOHEXOL 350 MG/ML SOLN COMPARISON:  Head CT earlier same day FINDINGS: CTA NECK FINDINGS Aortic arch: Mild aortic atherosclerosis. No aneurysm or dissection. Branching pattern is normal. Right carotid system: Common carotid artery widely patent to the bifurcation. There is calcified plaque at the carotid bifurcation and ICA bulb. Minimal diameter of the proximal ICA is 4 mm. Compared to a more distal  cervical diameter of 4 mm, there is no stenosis. Left carotid system: Common carotid artery is widely patent to the bifurcation region. There is calcified plaque at the carotid bifurcation and ICA bulb. Minimal diameter is 4.0 mm. No stenosis. Vertebral arteries: There is atherosclerotic plaque at the right vertebral artery origin with 50% stenosis. No left vertebral artery stenosis. Beyond the origins, both vertebral arteries are widely patent to the foramen magnum. Skeleton: Degenerative spondylosis. Other neck: No mass or lymphadenopathy. Upper chest: Emphysema and pulmonary scarring.  No active process Review of the MIP images confirms the above findings CTA HEAD FINDINGS Anterior  circulation: Both internal carotid arteries are patent through the skull base. There is atherosclerotic change in both carotid siphon regions. Stenosis is estimated at 50% on each side. The anterior and middle cerebral vessels are patent without proximal stenosis, aneurysm or vascular malformation. No large or medium vessel occlusion is seen. Posterior circulation: Both vertebral arteries are patent through the foramen magnum to the basilar. No basilar stenosis. Posterior circulation branch vessels show flow. Venous sinuses: Patent and normal. Anatomic variants: None significant. Review of the MIP images confirms the above findings IMPRESSION: Atherosclerotic disease at both carotid bifurcations, but no stenosis when compared to the diameter of the more distal cervical internal carotid arteries. 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 No other posterior circulation stenosis in the neck. Atherosclerotic disease in both carotid bifurcation regions with 50% stenosis on each side. No intracranial large or medium vessel occlusion. Electronically Signed: By: Nelson Chimes M.D. On: 04/19/2019 14:07   Dg Chest 2 View  Result Date: 04/19/2019 CLINICAL DATA:  Patient presenting with stroke-like symptoms. EXAM: CHEST - 2 VIEW COMPARISON:  February 02, 2015 FINDINGS: The heart size and mediastinal contours are within normal limits. Both lungs are clear. The visualized skeletal structures are unremarkable. IMPRESSION: No active cardiopulmonary disease. Electronically Signed   By: Dorise Bullion III M.D   On: 04/19/2019 14:39   Ct Head Wo Contrast  Result Date: 04/19/2019 CLINICAL DATA:  Right-sided weakness and slurred speech. Altered mental status. EXAM: CT HEAD WITHOUT CONTRAST TECHNIQUE: Contiguous axial images were obtained from the base of the skull through the vertex without intravenous contrast. COMPARISON:  May 03, 2010 FINDINGS: Brain: No subdural, epidural, or subarachnoid hemorrhage identified.  The cerebellum and brainstem are normal. Basal cisterns are patent. There is a lacunar infarct in the external capsule on series 3, image 13, stable. Another lacunar infarct on the left as seen on series 3, image 14 not definitely seen on the previous study. White matter changes extend superiorly into the corona radiata such as on series 3, image 17, not definitely seen previously. No acute cortical ischemia is identified. Ventricles and sulci are unremarkable. No mass effect or midline shift. Vascular: Calcified atherosclerosis is seen in the intracranial carotids. Skull: Normal. Negative for fracture or focal lesion. Sinuses/Orbits: No acute finding. Other: A subcutaneous nodule is seen over the left forehead on series 3, image 17, of doubtful significance, likely a sebaceous cyst or other benign skin lesion. Recommend clinical correlation. Extracranial soft tissues are otherwise within normal limits. IMPRESSION: 1. There are white matter changes in the left corona radiata and a small lacunar infarct in the left basal ganglia, described above, which were not definitely seen previously. However, the most recent comparison is from 2011 and the findings are otherwise age indeterminate but could be nonacute. No other acute intracranial abnormalities are noted. 2. A subcutaneous nodule over the left forehead  is of doubtful significance. Recommend clinical correlation. Electronically Signed   By: Dorise Bullion III M.D   On: 04/19/2019 10:38   Ct Angio Neck W Or Wo Contrast  Addendum Date: 04/19/2019   ADDENDUM REPORT: 04/19/2019 14:36 ADDENDUM: Speech recognition error in the initial report. Third impression should read as follows. Atherosclerotic disease in both carotid siphon regions with 50 percent stenosis on each side. To clarify, there is no stenosis at the carotid bifurcation regions. Electronically Signed   By: Nelson Chimes M.D.   On: 04/19/2019 14:36   Result Date: 04/19/2019 CLINICAL DATA:  Speech  disturbance. Right-sided weakness. Altered mental status. EXAM: CT ANGIOGRAPHY HEAD AND NECK TECHNIQUE: Multidetector CT imaging of the head and neck was performed using the standard protocol during bolus administration of intravenous contrast. Multiplanar CT image reconstructions and MIPs were obtained to evaluate the vascular anatomy. Carotid stenosis measurements (when applicable) are obtained utilizing NASCET criteria, using the distal internal carotid diameter as the denominator. CONTRAST:  36mL OMNIPAQUE IOHEXOL 350 MG/ML SOLN COMPARISON:  Head CT earlier same day FINDINGS: CTA NECK FINDINGS Aortic arch: Mild aortic atherosclerosis. No aneurysm or dissection. Branching pattern is normal. Right carotid system: Common carotid artery widely patent to the bifurcation. There is calcified plaque at the carotid bifurcation and ICA bulb. Minimal diameter of the proximal ICA is 4 mm. Compared to a more distal cervical diameter of 4 mm, there is no stenosis. Left carotid system: Common carotid artery is widely patent to the bifurcation region. There is calcified plaque at the carotid bifurcation and ICA bulb. Minimal diameter is 4.0 mm. No stenosis. Vertebral arteries: There is atherosclerotic plaque at the right vertebral artery origin with 50% stenosis. No left vertebral artery stenosis. Beyond the origins, both vertebral arteries are widely patent to the foramen magnum. Skeleton: Degenerative spondylosis. Other neck: No mass or lymphadenopathy. Upper chest: Emphysema and pulmonary scarring.  No active process Review of the MIP images confirms the above findings CTA HEAD FINDINGS Anterior circulation: Both internal carotid arteries are patent through the skull base. There is atherosclerotic change in both carotid siphon regions. Stenosis is estimated at 50% on each side. The anterior and middle cerebral vessels are patent without proximal stenosis, aneurysm or vascular malformation. No large or medium vessel occlusion  is seen. Posterior circulation: Both vertebral arteries are patent through the foramen magnum to the basilar. No basilar stenosis. Posterior circulation branch vessels show flow. Venous sinuses: Patent and normal. Anatomic variants: None significant. Review of the MIP images confirms the above findings IMPRESSION: Atherosclerotic disease at both carotid bifurcations, but no stenosis when compared to the diameter of the more distal cervical internal carotid arteries. 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 No other posterior circulation stenosis in the neck. Atherosclerotic disease in both carotid bifurcation regions with 50% stenosis on each side. No intracranial large or medium vessel occlusion. Electronically Signed: By: Nelson Chimes M.D. On: 04/19/2019 14:07   Mr Brain Wo Contrast (neuro Protocol)  Result Date: 04/19/2019 CLINICAL DATA:  Speech disturbance.  Right-sided weakness. EXAM: MRI HEAD WITHOUT CONTRAST TECHNIQUE: Multiplanar, multiecho pulse sequences of the brain and surrounding structures were obtained without intravenous contrast. COMPARISON:  CT studies same day FINDINGS: Brain: Diffusion imaging shows acute infarction in the left putamen and radiating white matter tracts. No other acute infarction. No swelling or hemorrhage. The brainstem and cerebellum are normal. Cerebral hemispheres show mild chronic small-vessel change of the deep white matter. No cortical or large vessel territory infarction.  No mass lesion, hydrocephalus or extra-axial collection. Vascular: Major vessels at the base of the brain show flow. Skull and upper cervical spine: Negative Sinuses/Orbits: Clear/normal Other: None IMPRESSION: Acute infarction affecting the left putamen and radiating white matter tracts. No mass effect or hemorrhage. Mild chronic small-vessel ischemic change elsewhere affecting the cerebral hemispheric white matter. Electronically Signed   By: Nelson Chimes M.D.   On: 04/19/2019 14:35       Subjective: No headache, vision changes, speech changes, weakness, paresthesias.  Discharge Exam: Vitals:   04/20/19 0807 04/20/19 1235  BP: (!) 172/90 (!) 168/88  Pulse: 78 87  Resp:    Temp: 97.6 F (36.4 C) 97.6 F (36.4 C)  SpO2: 100% 99%   Vitals:   04/20/19 0029 04/20/19 0500 04/20/19 0807 04/20/19 1235  BP: (!) 156/81 (!) 190/88 (!) 172/90 (!) 168/88  Pulse: 67  78 87  Resp:      Temp: 98 F (36.7 C) 97.6 F (36.4 C) 97.6 F (36.4 C) 97.6 F (36.4 C)  TempSrc: Oral  Oral Oral  SpO2: 97% 100% 100% 99%  Weight:      Height:        General: Pt is alert, awake, not in acute distress Cardiovascular: RRR, nl S1-S2, no murmurs appreciated.   No LE edema.   Respiratory: Normal respiratory rate and rhythm.  CTAB without rales or wheezes. Abdominal: Abdomen soft and non-tender.  No distension or HSM.   Neuro/Psych: Strength symmetric in upper and lower extremities.  Judgment and insight appear normal.   The results of significant diagnostics from this hospitalization (including imaging, microbiology, ancillary and laboratory) are listed below for reference.     Microbiology: Recent Results (from the past 240 hour(s))  SARS CORONAVIRUS 2 (TAT 6-12 HRS) Nasal Swab     Status: None   Collection Time: 04/19/19  9:30 AM   Specimen: Nasal Swab  Result Value Ref Range Status   SARS Coronavirus 2 NEGATIVE NEGATIVE Final    Comment: (NOTE) SARS-CoV-2 target nucleic acids are NOT DETECTED. The SARS-CoV-2 RNA is generally detectable in upper and lower respiratory specimens during the acute phase of infection. Negative results do not preclude SARS-CoV-2 infection, do not rule out co-infections with other pathogens, and should not be used as the sole basis for treatment or other patient management decisions. Negative results must be combined with clinical observations, patient history, and epidemiological information. The expected result is Negative. Fact Sheet for  Patients: SugarRoll.be Fact Sheet for Healthcare Providers: https://www.woods-mathews.com/ This test is not yet approved or cleared by the [place] FDA and  has been authorized for detection and/or diagnosis of SARS-CoV-2 by FDA under an Emergency Use Authorization (EUA). This EUA will remain  in effect (meaning this test can be used) for the duration of the COVID-19 declaration under Section 56 4(b)(1) of the Act, 21 U.S.C. section 360bbb-3(b)(1), unless the authorization is terminated or revoked sooner. Performed at [facility], [place] [address] [place], [place] [address]      Labs: BNP (last 3 results) No results for input(s): BNP in the last 8760 hours. Basic Metabolic Panel: Recent Labs  Lab 04/19/19 0909  NA 139  K 3.2*  CL 105  CO2 26  GLUCOSE 99  BUN 14  CREATININE 0.95  CALCIUM 8.9   Liver Function Tests: Recent Labs  Lab 04/19/19 0909  AST 17  ALT 22  ALKPHOS 58  BILITOT 0.8  PROT 6.3*  ALBUMIN 3.6   No  results for input(s): LIPASE, AMYLASE in the last 168 hours. No results for input(s): AMMONIA in the last 168 hours. CBC: Recent Labs  Lab 04/19/19 0909  WBC 14.1*  NEUTROABS 11.7*  HGB 13.7  HCT 40.0  MCV 97.3  PLT 341   Cardiac Enzymes: No results for input(s): CKTOTAL, CKMB, CKMBINDEX, TROPONINI in the last 168 hours. BNP: Invalid input(s): POCBNP CBG: No results for input(s): GLUCAP in the last 168 hours. D-Dimer No results for input(s): DDIMER in the last 72 hours. Hgb A1c Recent Labs    04/20/19 0532  HGBA1C 5.1   Lipid Profile Recent Labs    04/20/19 0532  CHOL 151  HDL 73  LDLCALC 62  TRIG 78  CHOLHDL 2.1   Thyroid function studies Recent Labs    04/19/19 1418  TSH 1.468   Anemia work up No results for input(s): VITAMINB12, FOLATE, FERRITIN, TIBC, IRON, RETICCTPCT in the last 72 hours. Urinalysis    Component Value Date/Time   COLORURINE YELLOW 04/19/2019  0930   APPEARANCEUR CLEAR 04/19/2019 0930   LABSPEC 1.012 04/19/2019 0930   PHURINE 6.0 04/19/2019 0930   GLUCOSEU NEGATIVE 04/19/2019 0930   HGBUR NEGATIVE 04/19/2019 0930   BILIRUBINUR NEGATIVE 04/19/2019 0930   KETONESUR NEGATIVE 04/19/2019 0930   PROTEINUR NEGATIVE 04/19/2019 0930   UROBILINOGEN 2.0 (H) 05/03/2010 2059   NITRITE NEGATIVE 04/19/2019 0930   LEUKOCYTESUR NEGATIVE 04/19/2019 0930   Sepsis Labs Invalid input(s): PROCALCITONIN,  WBC,  LACTICIDVEN Microbiology Recent Results (from the past 240 hour(s))  SARS CORONAVIRUS 2 (TAT 6-12 HRS) Nasal Swab     Status: None   Collection Time: 04/19/19  9:30 AM   Specimen: Nasal Swab  Result Value Ref Range Status   SARS Coronavirus 2 NEGATIVE NEGATIVE Final    Comment: (NOTE) SARS-CoV-2 target nucleic acids are NOT DETECTED. The SARS-CoV-2 RNA is generally detectable in upper and lower respiratory specimens during the acute phase of infection. Negative results do not preclude SARS-CoV-2 infection, do not rule out co-infections with other pathogens, and should not be used as the sole basis for treatment or other patient management decisions. Negative results must be combined with clinical observations, patient history, and epidemiological information. The expected result is Negative. Fact Sheet for Patients: SugarRoll.be Fact Sheet for Healthcare Providers: https://www.woods-mathews.com/ This test is not yet approved or cleared by the [place] FDA and  has been authorized for detection and/or diagnosis of SARS-CoV-2 by FDA under an Emergency Use Authorization (EUA). This EUA will remain  in effect (meaning this test can be used) for the duration of the COVID-19 declaration under Section 56 4(b)(1) of the Act, 21 U.S.C. section 360bbb-3(b)(1), unless the authorization is terminated or revoked sooner. Performed at [facility], [place] [address] [place],   [address]      Time coordinating discharge: 40 minutes      SIGNED:   Edwin Dada, MD  [facility] 04/20/2019, 5:04 PM

## 2019-04-20 NOTE — Evaluation (Signed)
Speech Language Pathology Evaluation Patient Details Name: Drew Nguyen MRN: [id number] DOB: 1956-03-18 Today's Date: 04/20/2019 Time: 2703-5009 SLP Time Calculation (min) (ACUTE ONLY): 30 min  Problem List:  Patient Active Problem List   Diagnosis Date Noted  . CVA (cerebral vascular accident) ([place]) 04/19/2019  . Essential hypertension 04/19/2019  . Cocaine abuse ([place]) 04/19/2019  . Adjustment disorder 04/19/2019  . Dysphagia 10/25/2018  . Tobacco abuse 09/13/2018  . COPD GOLD I, still smoking 09/13/2018  . Rheumatoid arthritis ([place]) 09/13/2018   Past Medical History:  Past Medical History:  Diagnosis Date  . COPD (chronic obstructive pulmonary disease) ([place])   . Current smoker   . Depression   . Hypertension   . RA (rheumatoid arthritis) ([place])   . Tobacco abuse    Past Surgical History:  Past Surgical History:  Procedure Laterality Date  . ANKLE SURGERY    . MULTIPLE TOOTH EXTRACTIONS     HPI:  Drew Nguyen is a 63 y.o. male with medical history significant of RA; HTN; and COPD presenting with stroke-like symptoms.  He reports difficult speaking.  His brother reports sluggish speech, fatigue, not sleeping well. +dysphagia - he had an operation for that not too long ago, said he had candidal esophagitis as well as a "different type" stomach infection that sounds like H pylori, was on antibiotics for a month.  The swallow has gotten worse in the last week.  No numbness/weakness/tingling of arms and legs.  He is not sure when he last used cocaine, "it might have been 3 days ago."  He reports that he doesn't have a drug addiction.  He uses cocaine to calm his nerves.  MRI 8/29: "Acute infarction affecting the left putamen and radiating white matter tracts."  CXR 8/29: no active disease   Assessment / Plan / Recommendation Clinical Impression  The Montreal Cognitive Assessment (MoCA) was administered. Pt scored 22/30 indicating mild cognitive impairment for pt age and education  (9th grade per pt report). Points were lost on the following subtests: executive function, immediate and delayed recall, thought organization, and mental math. Deficits could be related to limited education or history of drug use. Of concern, however, is pt's responsibility for 3 young daughters (ages 79, 76, and 10), given deficits in memory and thought organization, however, pt's baseline cognitive status is unknown at this time. Recommend follow up with home health or outpatient speech therapy to evaluate and treat pt higher level problem solving, functional recall, and safety. No further ST intervention is recommended acutely.    SLP Assessment  SLP Recommendation/Assessment: All further Speech Language Pathology needs can be addressed in the next venue of care SLP Visit Diagnosis: Cognitive communication deficit (R41.841)    Follow Up Recommendations  Home health or outpatient SLP       SLP Evaluation Cognition  Overall Cognitive Status: Impaired/Different from baseline Arousal/Alertness: Awake/alert Orientation Level: Oriented X4 Attention: Focused;Sustained Focused Attention: Appears intact Sustained Attention: Appears intact Memory: Impaired Memory Impairment: Storage deficit;Retrieval deficit;Decreased recall of new information;Decreased short term memory Decreased Short Term Memory: Verbal basic Awareness: Impaired Awareness Impairment: Intellectual impairment Problem Solving: Impaired Problem Solving Impairment: Verbal basic Safety/Judgment: Impaired       Comprehension  Auditory Comprehension Overall Auditory Comprehension: Appears within functional limits for tasks assessed    Expression Expression Primary Mode of Expression: Verbal Verbal Expression Overall Verbal Expression: Appears within functional limits for tasks assessed Written Expression Dominant Hand: Right   Oral / Motor  Oral Motor/Sensory Function  Overall Oral Motor/Sensory Function: Within functional  limits Motor Speech Overall Motor Speech: Appears within functional limits for tasks assessed   GO                   Celia B. Quentin [facility], CCC-SLP Speech Language Pathologist [phone]  Shonna Chock 04/20/2019, 3:39 PM

## 2019-04-20 NOTE — Progress Notes (Signed)
STROKE TEAM PROGRESS NOTE   HISTORY OF PRESENT ILLNESS (per record) Drew Nguyen is a 63 y.o. male presenting with right sided weakness and slurred speech for the past 3 days. Also has had impaired gait with stumbling to the right. He endorses that the speech is not just slurred, but also involves difficulty with word finding and new onset stuttering of speech. He was concerned that he was having a stroke and decided to come to the ED for evaluation. His brother accompanies him here. He has no prior history of stroke.   CT head:  1. There are white matter changes in the left corona radiata and a small lacunar infarct in the left basal ganglia, described above, which were not definitely seen previously. However, the most recent comparison is from 2011 and the findings are otherwise age indeterminate but could be nonacute. No other acute intracranial abnormalities are noted. 2. A subcutaneous nodule over the left forehead is of doubtful significance. Recommend clinical correlation.   CTA head and neck: Atherosclerotic disease at both carotid bifurcations, but no stenosis when compared to the diameter of the more distal cervical internal carotid arteries. 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 No other posterior circulation stenosis in the neck. Atherosclerotic disease in both carotid  bifurcation regions with 50% stenosis on each side. No intracranial large or medium vessel occlusion.   LSN: 3 days prior to presentation tPA Given: No: Out of time window  INTERVAL HISTORY His family is not at bedside. Discussed stroke, reviewed images with patient, he states he has been under significant stress, he has 3 small children at home and wants to leave today. I discussed the pending workup.     OBJECTIVE Vitals:   04/20/19 0029 04/20/19 0500 04/20/19 0807 04/20/19 1235  BP: (!) 156/81 (!) 190/88 (!) 172/90 (!) 168/88  Pulse: 67  78 87  Resp:      Temp: 98 F (36.7 C) 97.6 F  (36.4 C) 97.6 F (36.4 C) 97.6 F (36.4 C)  TempSrc: Oral  Oral Oral  SpO2: 97% 100% 100% 99%  Weight:      Height:        CBC:  Recent Labs  Lab 04/19/19 0909  WBC 14.1*  NEUTROABS 11.7*  HGB 13.7  HCT 40.0  MCV 97.3  PLT 858    Basic Metabolic Panel:  Recent Labs  Lab 04/19/19 0909  NA 139  K 3.2*  CL 105  CO2 26  GLUCOSE 99  BUN 14  CREATININE 0.95  CALCIUM 8.9    Lipid Panel:     Component Value Date/Time   CHOL 151 04/20/2019 0532   TRIG 78 04/20/2019 0532   HDL 73 04/20/2019 0532   CHOLHDL 2.1 04/20/2019 0532   VLDL 16 04/20/2019 0532   LDLCALC 62 04/20/2019 0532   HgbA1c:  Lab Results  Component Value Date   HGBA1C 5.1 04/20/2019   Urine Drug Screen:     Component Value Date/Time   LABOPIA NONE DETECTED 04/19/2019 0930   COCAINSCRNUR POSITIVE (A) 04/19/2019 0930   LABBENZ POSITIVE (A) 04/19/2019 0930   AMPHETMU NONE DETECTED 04/19/2019 0930   THCU NONE DETECTED 04/19/2019 0930   LABBARB NONE DETECTED 04/19/2019 0930    Alcohol Level No results found for: ETH   IMAGING  Ct Angio Head W Or Wo Contrast Ct Angio Neck W Or Wo Contrast 04/19/2019   ADDENDUM REPORT: Third impression should read as follows. Atherosclerotic disease in both carotid siphon regions  with 50 percent stenosis on each side. To clarify, there is no stenosis at the carotid bifurcation regions.  IMPRESSION:   Atherosclerotic disease at both carotid bifurcations, but no stenosis when compared to the diameter of the more distal cervical internal carotid arteries.   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 No other posterior circulation stenosis in the neck.   Atherosclerotic disease in both carotid siphon regions with 50 percent stenosis on each side. To clarify, there is no stenosis at the carotid bifurcation regions.   Dg Chest 2 View 04/19/2019 IMPRESSION: No active cardiopulmonary disease.   Ct Head Wo Contrast 04/19/2019 IMPRESSION:  1. There are  white matter changes in the left corona radiata and a small lacunar infarct in the left basal ganglia, described above, which were not definitely seen previously. However, the most recent comparison is from 2011 and the findings are otherwise age indeterminate but could be nonacute. No other acute intracranial abnormalities are noted.  2. A subcutaneous nodule over the left forehead is of doubtful significance. Recommend clinical correlation.   Mr Brain Wo Contrast (neuro Protocol) 04/19/2019 IMPRESSION: Acute infarction affecting the left putamen and radiating white matter tracts. No mass effect or hemorrhage. Mild chronic small-vessel ischemic change elsewhere affecting the cerebral hemispheric white matter.    Transthoracic Echocardiogram  00/00/2020 Pending No results found for this or any previous visit (from the past 43800 hour(s)).   ECG - SR rate 76 BPM. (See cardiology reading for complete details)   PHYSICAL EXAM Blood pressure (!) 168/88, pulse 87, temperature 97.6 F (36.4 C), temperature source Oral, resp. rate 16, height 5\' 6"  (1.676 m), weight 63.5 kg, SpO2 99 %.   Exam: NAD, anxious Speech:    Speech is dysarthric, normal comprehension.  Cognition:    The patient is oriented to person, place, and time;     recent and remote memory intact;     language without aphasia but does have a stuttering quality, can name and repeat   Cranial Nerves:    The pupils are equal, round, and reactive to light.Trigeminal sensation is intact and the muscles of mastication are normal. The face is symmetric. The palate elevates in the midline. Hearing intact. Voice is normal. Shoulder shrug is normal. The tongue has normal motion without fasciculations.   Coordination:  +dysmetria  Motor Observation:     no involuntary movements noted. Tone:    Normal muscle tone.     Strength:    Strength is V/V in the upper and lower limbs.      Sensation: intact to  LT      ASSESSMENT/PLAN Drew Nguyen is a 63 y.o. male with history of tobacco use, other substance abuse, COPD, depression, hypertension and Rheumatoid arthritits presenting with right sided weakness, impaired gait with stumbling to the right. and speech difficulties for the past 3 days. Also has had impaired gait with stumbling to the right. He endorses that the speech is not just slurred, but also involves difficulty with word finding and new onset stuttering of speech . He did not receive IV t-PA due to late presentation (>4.5 hours from time of onset)   Stroke: acute infarction affecting the left putamen and radiating white matter tracts secondary to small vessel disease   Code Stroke CT Head - not ordered  CT head - There are white matter changes in the left corona radiata and a small lacunar infarct in the left basal ganglia  MRI head - Acute infarction  affecting the left putamen and radiating white matter tracts  MRA head - not ordered  CTA H&N - 50 percent stenosis on each side.  CT Perfusion - not ordered  Carotid Doppler - CTA neck performed - carotid dopplers not indicated.  2D Echo - pending  Lacey Jensen Virus 2 - negative  LDL - 62  HgbA1c - 5.1  UDS - benzodiazepine ; cocaine  VTE prophylaxis - Lovenox  Diet - regular  No antithrombotic prior to admission, now on aspirin 325 mg daily  Patient counseled to be compliant with his antithrombotic medications. Discharge on DUAP for 3 weeks then asa 81mg  alone  Ongoing aggressive stroke risk factor management  Therapy recommendations:  Outpatient vs HH Therapy  Disposition:  Pending  Hypertension  Home BP meds: none  Current BP meds: none  Stable . Permissive hypertension (OK if < 220/120) but gradually normalize in 5-7 days Lipitor 40 mg daily . Long-term BP goal normotensive  Hyperlipidemia  Home Lipid lowering medication: none  LDL 62, goal < 70  Current lipid lowering medication:  Lipitor 40 mg daily   Continue statin at discharge  Other Stroke Risk Factors  Advanced age  Cigarette smoker - advised to stop smoking  ETOH use, advised to drink no more than 1 alcoholic beverage per day.  Substance abuse - +cocaine   Other Active Problems  Hypokalemia - 3.2 - supplement and recheck in AM  Mild Leukocytosis - 14.1 - afebrile (Deltasone for arthiritis)  Acute infarction affecting the left putamen and radiating white matter tracts - small vessel disease, smoking, +cocaine  Hospital day # 1  Pending echocardiogram, if unremarkable then stroke will sign off.  Personally examined patient and images, and have participated in and made any corrections needed to history, physical, neuro exam,assessment and plan as stated above.  I have personally obtained the history, evaluated lab date, reviewed imaging studies and agree with radiology interpretations.    Sarina Ill, MD Stroke Neurology   A total of 25 minutes was spent for the care of this patient, spent on counseling patient and family on different diagnostic and therapeutic options, counseling and coordination of care, riskd ans benefits of management, compliance, or risk factor reduction and education.   To contact Stroke Continuity provider, please refer to [email or web address] After hours, contact General Neurology

## 2019-04-20 NOTE — Progress Notes (Signed)
AVS reviewed with patient and patient given a copy to take home. All lines removed, patient dressed, belongings packed, and patient taken by nurse via wheelchair to friend's car for discharge.

## 2019-04-20 NOTE — Evaluation (Signed)
Physical Therapy Evaluation Patient Details Name: GARO HEIDELBERG MRN: [id number] DOB: 1956-05-20 Today's Date: 04/20/2019   History of Present Illness  63 y.o. male admit with stroke-like symptoms. MRI 8/29: Acute infarction affecting the left putamen and radiating white matter tracts. Pt with dysarthria, ataxia. Pt with medical history significant of RA; HTN; and COPD. Pt reports psychosocial issues with GF and her schizophrenia with rages.    Clinical Impression  Pt presents with mild residual balance deficits likely due to inactivity during hospital stay and just waking from nap. Pt does demonstrate some cognitive dysfunction, see OT note.  Pt anxious to d/c and return home to children.  Function like close to baseline and expect to return as activity increases.  No PT follow recommended at this time.    Follow Up Recommendations No PT follow up    Equipment Recommendations  None recommended by PT    Recommendations for Other Services       Precautions / Restrictions Precautions Precautions: Fall Restrictions Weight Bearing Restrictions: No      Mobility  Bed Mobility Overal bed mobility: Modified Independent                Transfers Overall transfer level: Modified independent               General transfer comment: pt up to bathroom without assist, no overt instability noted  Ambulation/Gait Ambulation/Gait assistance: Supervision Gait Distance (Feet): 20 Feet Assistive device: None Gait Pattern/deviations: Step-through pattern;Trunk flexed     General Gait Details: pt slightly unsteady with turns, and continually states "i just woke up"  Stairs            Wheelchair Mobility    Modified Rankin (Stroke Patients Only) Modified Rankin (Stroke Patients Only) Pre-Morbid Rankin Score: No symptoms Modified Rankin: No significant disability     Balance Overall balance assessment: Needs assistance Sitting-balance support: Feet supported Sitting  balance-Leahy Scale: Good     Standing balance support: No upper extremity supported Standing balance-Leahy Scale: Fair   Single Leg Stance - Right Leg: 2 Single Leg Stance - Left Leg: 5     [place]berg - Eyes Opened: 30 [place] - Eyes Closed: 8(LOB to right)                 Pertinent Vitals/Pain Pain Assessment: No/denies pain    Home Living Family/patient expects to be discharged to:: Private residence Living Arrangements: Children Available Help at Discharge: Family;Available 24 hours/day Type of Home: House Home Access: Level entry     Home Layout: One level Home Equipment: Walker - 2 wheels;Wheelchair - manual;Bedside commode      Prior Function Level of Independence: Independent         Comments: Driving, retired, takes care of all 3 children     Hand Dominance   Dominant Hand: Right    Extremity/Trunk Assessment   Upper Extremity Assessment Upper Extremity Assessment: Defer to OT evaluation    Lower Extremity Assessment Lower Extremity Assessment: Generalized weakness;Overall [facility] for tasks assessed    Cervical / Trunk Assessment Cervical / Trunk Assessment: Normal  Communication   Communication: Expressive difficulties(some remaining stuttering-like speech issues)  Cognition Arousal/Alertness: Awake/alert(just woke so a bit foggy at first) Behavior During Therapy: Restless;Anxious Overall Cognitive Status: Impaired/Different from baseline Area of Impairment: Problem solving;Memory                     Memory: Decreased short-term memory  Problem Solving: Slow processing General Comments: see OT notes; pt anxious about going home, talks of 3 children "I've got to get back to them" and distracted by this thought; recalcitrant to discuss deficits, states "I'm fine now      General Comments General comments (skin integrity, edema, etc.): restless and anxious to go home, distracted waiting on [place] tech.      Exercises      Assessment/Plan    PT Assessment Patent does not need any further PT services  PT Problem List         PT Treatment Interventions      PT Goals (Current goals can be found in the Care Plan section)  Acute Rehab PT Goals Patient Stated Goal: home, care for kids PT Goal Formulation: All assessment and education complete, DC therapy    Frequency     Barriers to discharge        Co-evaluation               AM-PAC PT "6 Clicks" Mobility  Outcome Measure Help needed turning from your back to your side while in a flat bed without using bedrails?: None Help needed moving from lying on your back to sitting on the side of a flat bed without using bedrails?: None Help needed moving to and from a bed to a chair (including a wheelchair)?: None Help needed standing up from a chair using your arms (e.g., wheelchair or bedside chair)?: None Help needed to walk in hospital room?: A Little Help needed climbing 3-5 steps with a railing? : A Little 6 Click Score: 22    End of Session   Activity Tolerance: Patient tolerated treatment well Patient left: in chair;with chair alarm set Nurse Communication: Mobility status PT Visit Diagnosis: Difficulty in walking, not elsewhere classified (R26.2)    Time: 1447-1510 PT Time Calculation (min) (ACUTE ONLY): 23 min   Charges:   PT Evaluation $PT Eval Low Complexity: [place], PT, DPT, MS Board Certified Geriatric Clinical Specialist  Herbie Drape 04/20/2019, 3:34 PM

## 2019-05-20 ENCOUNTER — Encounter: Payer: Self-pay | Admitting: Adult Health

## 2019-05-20 ENCOUNTER — Telehealth: Payer: Self-pay

## 2019-05-20 ENCOUNTER — Inpatient Hospital Stay: Payer: Medicaid Other | Admitting: Adult Health

## 2019-05-20 NOTE — Progress Notes (Deleted)
[facility] [address] [place]. [place] [address] [phone]       HOSPITAL FOLLOW UP NOTE  [name] Date of Birth:  February 28, 1956 Medical Record Number:  [id number]   Reason for Referral:  hospital stroke follow up    CHIEF COMPLAINT:  No chief complaint on file.   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  History obtained from *** and chart review. Reviewed all radiology images and labs personally.  Drew Nguyen a 63 y.o.malepresented on 04/19/2019 with right sided weakness and slurred speech for the past 3 days. Also has had impaired gait with stumbling to the right. He endorses that the speech is not just slurred, but also involves difficulty with word finding and new onset stuttering of speech. He was concerned that he was having a stroke and decided to come to the ED for evaluation.  Stroke work-up showed acute infarction affecting the left putaman and radiating white matter tracts as evidenced on MRI secondary to small vessel disease source as evidenced on MRI.  He did not receive IV tPA due to late presentation.  CTA head/neck showed bilateral 50% stenosis.  2D echo unremarkable.  LDL 62.  A1c 5.1.  UDS positive for benzos and cocaine.  Recommended DAPT for 3 weeks and aspirin alone.  HTN stable.  Initiated atorvastatin 40 mg daily for HLD management.  Other stroke risk factors include advanced age, tobacco use, EtOH use and substance abuse but no prior history of stroke.  He was discharged home in stable condition with recommendation of outpatient therapy.     ROS:   14 system review of systems performed and negative with exception of ***  PMH:  Past Medical History:  Diagnosis Date  . COPD (chronic obstructive pulmonary disease) ([place])   . Current smoker   . Depression   .  Hypertension   . RA (rheumatoid arthritis) ([place])   . Tobacco abuse     PSH:  Past Surgical History:  Procedure Laterality Date  . ANKLE SURGERY    . MULTIPLE TOOTH EXTRACTIONS      Social History:  Social History   Socioeconomic History  . Marital status: Single    Spouse name: Not on file  . Number of children: Not on file  . Years of education: Not on file  . Highest education level: Not on file  Occupational History  . Occupation: retired  Scientific laboratory technician  . Financial resource strain: Not on file  . Food insecurity    Worry: Not on file    Inability: Not on file  . Transportation needs    Medical: Not on file    Non-medical: Not on file  Tobacco Use  . Smoking status: Current Every Day Smoker    Packs/day: 1.50    Years: 48.00    Pack years: 72.00    Types: Cigarettes  . Smokeless tobacco: Never Used  . Tobacco comment: 10/25/18 1 to 1 1/2 ppd  Substance and Sexual Activity  . Alcohol use: Yes    Comment: occ  . Drug use: Yes    Types: Cocaine, Benzodiazepines  . Sexual activity: Not on file  Lifestyle  . Physical activity    Days per week: Not on file    Minutes per session: Not on file  . Stress: Not on file  Relationships  .  Social Herbalist on phone: Not on file    Gets together: Not on file    Attends religious service: Not on file    Active member of club or organization: Not on file    Attends meetings of clubs or organizations: Not on file    Relationship status: Not on file  . Intimate partner violence    Fear of current or ex partner: Not on file    Emotionally abused: Not on file    Physically abused: Not on file    Forced sexual activity: Not on file  Other Topics Concern  . Not on file  Social History Narrative  . Not on file    Family History:  Family History  Problem Relation Age of Onset  . Hypertension Mother   . Heart attack Mother   . Heart attack Father   . Brain cancer Brother   . Colon cancer Maternal Aunt   .  Stomach cancer Neg Hx   . Esophageal cancer Neg Hx   . Pancreatic cancer Neg Hx     Medications:   Current Outpatient Medications on File Prior to Visit  Medication Sig Dispense Refill  . acetaminophen (TYLENOL) 500 MG tablet Take 500 mg by mouth every 6 (six) hours as needed for mild pain, fever or headache.     Marland Kitchen amLODipine (NORVASC) 5 MG tablet Take 1 tablet (5 mg total) by mouth daily. 30 tablet 11  . aspirin EC 81 MG tablet Take 1 tablet (81 mg total) by mouth daily. 30 tablet 11  . COMBIVENT RESPIMAT 20-100 MCG/ACT AERS respimat Inhale 1 puff into the lungs 4 (four) times daily.    . folic acid (FOLVITE) 1 MG tablet Take 1 mg by mouth daily.    . hydrochlorothiazide (MICROZIDE) 12.5 MG capsule Take 1 capsule (12.5 mg total) by mouth daily. 30 capsule 11  . nicotine (NICODERM CQ - DOSED IN MG/24 HOURS) 14 mg/24hr patch Place 1 patch (14 mg total) onto the skin daily. 28 patch 0  . omeprazole (PRILOSEC) 20 MG capsule Take 1 capsule (20 mg total) by mouth 2 (two) times daily for 10 days. 20 capsule 0  . predniSONE (DELTASONE) 10 MG tablet Take 5-10 mg by mouth See admin instructions. 10mg  in the morning and 5mg  in the evening    . PROAIR HFA 108 (90 Base) MCG/ACT inhaler Inhale 1-2 puffs into the lungs every 4 (four) hours as needed for wheezing or shortness of breath.   2  . SYMBICORT 160-4.5 MCG/ACT inhaler Inhale 2 puffs into the lungs 2 (two) times daily.    . tamsulosin (FLOMAX) 0.4 MG CAPS capsule Take 0.4 mg by mouth daily.   3   No current facility-administered medications on file prior to visit.     Allergies:   Allergies  Allergen Reactions  . Celexa [Citalopram] Other (See Comments)    Over sedation Sleeps too much     Physical Exam  There were no vitals filed for this visit. There is no height or weight on file to calculate BMI. No exam data present  No flowsheet data found.   General: well developed, well nourished, seated, in no evident distress Head: head  normocephalic and atraumatic.   Neck: supple with no carotid or supraclavicular bruits Cardiovascular: regular rate and rhythm, no murmurs Musculoskeletal: no deformity Skin:  no rash/petichiae Vascular:  Normal pulses all extremities   Neurologic Exam Mental Status: Awake and fully alert. Oriented to place  and time. Recent and remote memory intact. Attention span, concentration and fund of knowledge appropriate. Mood and affect appropriate.  Cranial Nerves: Fundoscopic exam reveals sharp disc margins. Pupils equal, briskly reactive to light. Extraocular movements full without nystagmus. Visual fields full to confrontation. Hearing intact. Facial sensation intact. Face, tongue, palate moves normally and symmetrically.  Motor: Normal bulk and tone. Normal strength in all tested extremity muscles. Sensory.: intact to touch , pinprick , position and vibratory sensation.  Coordination: Rapid alternating movements normal in all extremities. Finger-to-nose and heel-to-shin performed accurately bilaterally. Gait and Station: Arises from chair without difficulty. Stance is normal. Gait demonstrates normal stride length and balance Reflexes: 1+ and symmetric. Toes downgoing.     NIHSS  *** Modified Rankin  ***    Diagnostic Data (Labs, Imaging, Testing)  Ct Angio Head W Or Wo Contrast Ct Angio Neck W Or Wo Contrast 04/19/2019   ADDENDUM REPORT: Third impression should read as follows. Atherosclerotic disease in both carotid siphon regions with 50 percent stenosis on each side. To clarify, there is no stenosis at the carotid bifurcation regions.  IMPRESSION:   Atherosclerotic disease at both carotid bifurcations, but no stenosis when compared to the diameter of the more distal cervical internal carotid arteries.   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 No other posterior circulation stenosis in the neck.   Atherosclerotic disease in both carotid siphon regions with 50 percent  stenosis on each side. To clarify, there is no stenosis at the carotid bifurcation regions.   Dg Chest 2 View 04/19/2019 IMPRESSION: No active cardiopulmonary disease.   Ct Head Wo Contrast 04/19/2019 IMPRESSION:  1. There are white matter changes in the left corona radiata and a small lacunar infarct in the left basal ganglia, described above, which were not definitely seen previously. However, the most recent comparison is from 2011 and the findings are otherwise age indeterminate but could be nonacute. No other acute intracranial abnormalities are noted.  2. A subcutaneous nodule over the left forehead is of doubtful significance. Recommend clinical correlation.   Mr Brain Wo Contrast (neuro Protocol) 04/19/2019 IMPRESSION: Acute infarction affecting the left putamen and radiating white matter tracts. No mass effect or hemorrhage. Mild chronic small-vessel ischemic change elsewhere affecting the cerebral hemispheric white matter.   ECHOCARDIOGRAM 04/20/2019 IMPRESSIONS  1. The left ventricle has normal systolic function, with an ejection fraction of 55-60%. The cavity size was normal. Left ventricular diastolic parameters were normal. No evidence of left ventricular regional wall motion abnormalities.  2. The right ventricle has normal systolic function. The cavity was normal. There is no increase in right ventricular wall thickness. Right ventricular systolic pressure could not be assessed.  3. Left atrial size was mildly dilated.  4. The aorta is normal unless otherwise noted.    ASSESSMENT: Drew Nguyen is a 63 y.o. year old male presented with right-sided weakness, impaired gait and speech difficulties on 04/19/2019 with stroke work-up revealing acute infarction affecting the left putamen and radiating white matter tracts secondary to small vessel disease source. Vascular risk factors include HTN, HLD, tobacco use, substance abuse.     PLAN:  1. Acute stroke: Continue aspirin 81  mg daily  and atorvastatin for secondary stroke prevention. Maintain strict control of hypertension with blood pressure goal below 130/90, diabetes with hemoglobin A1c goal below 6.5% and cholesterol with LDL cholesterol (bad cholesterol) goal below 70 mg/dL.  I also advised the patient to eat a healthy diet with plenty of whole  grains, cereals, fruits and vegetables, exercise regularly with at least 30 minutes of continuous activity daily and maintain ideal body weight. 2. HTN: Advised to continue current treatment regimen.  Today's BP ***.  Advised to continue to monitor at home along with continued follow-up with PCP for management 3. HLD: Advised to continue current treatment regimen along with continued follow-up with PCP for future prescribing and monitoring of lipid panel     Follow up in *** or call earlier if needed   Greater than 50% of time during this 45 minute visit was spent on counseling, explanation of diagnosis of ***, reviewing risk factor management of ***, planning of further management along with potential future management, and discussion with patient and family answering all questions.    Frann Rider, AGNP-BC  [facility] [address] [place], [place] [address]  Phone [phone] Fax [phone] Note: This document was prepared with digital dictation and possible smart phrase technology. Any transcriptional errors that result from this process are unintentional.

## 2019-05-20 NOTE — Telephone Encounter (Signed)
Patient was a no call/no show for their appointment today.   

## 2019-05-28 ENCOUNTER — Other Ambulatory Visit: Payer: Self-pay | Admitting: Otolaryngology

## 2019-05-28 ENCOUNTER — Other Ambulatory Visit (HOSPITAL_COMMUNITY): Payer: Self-pay | Admitting: Otolaryngology

## 2019-05-28 DIAGNOSIS — R1313 Dysphagia, pharyngeal phase: Secondary | ICD-10-CM

## 2019-06-02 ENCOUNTER — Ambulatory Visit (HOSPITAL_COMMUNITY)
Admission: RE | Admit: 2019-06-02 | Discharge: 2019-06-02 | Disposition: A | Discharge status: Home / Self Care | Payer: Medicaid Other | Source: Ambulatory Visit | Attending: Otolaryngology | Admitting: Otolaryngology

## 2019-06-02 ENCOUNTER — Other Ambulatory Visit: Payer: Self-pay

## 2019-06-02 DIAGNOSIS — R1313 Dysphagia, pharyngeal phase: Secondary | ICD-10-CM | POA: Diagnosis not present

## 2019-06-02 IMAGING — RF DG ESOPHAGUS
12 of 13 series · 16 of 24 positions shown · non-contrast
Comparison: Chest CT [DATE]

CLINICAL DATA: History of candidal esophagitis and recent diagnosis
of head neck cancer.

EXAM:
ESOPHOGRAM/BARIUM SWALLOW
TECHNIQUE: Single contrast examination was performed using  thin barium.
FLUOROSCOPY TIME:  Fluoroscopy Time:  3 minutes 40 seconds
Radiation Exposure Index (if provided by the fluoroscopic device):
18.1 mGy
Number of Acquired Spot Images: 2

[Series 1: cp_standard · 0.34mm/px · 1 of 28 frames shown (1 of 12)]
[frame 5/28]
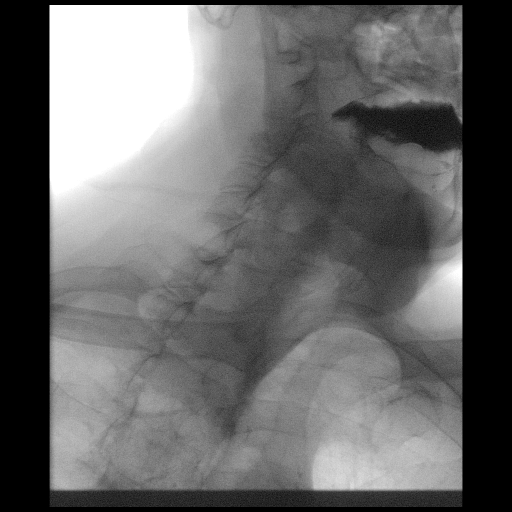

[Series 2: cp_standard · 0.34mm/px · 2 of 44 frames shown (2 of 12)]
[frame 7/44]
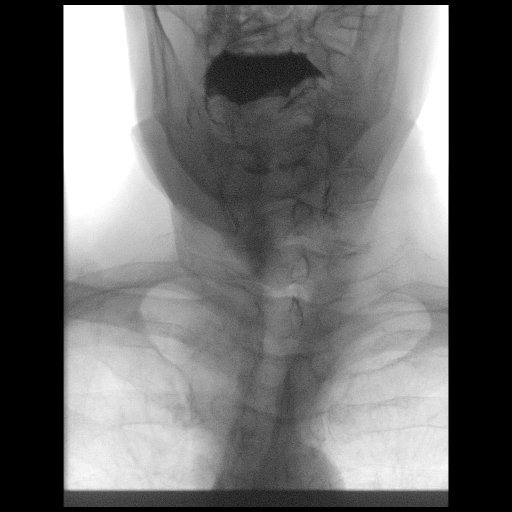
[frame 38/44]
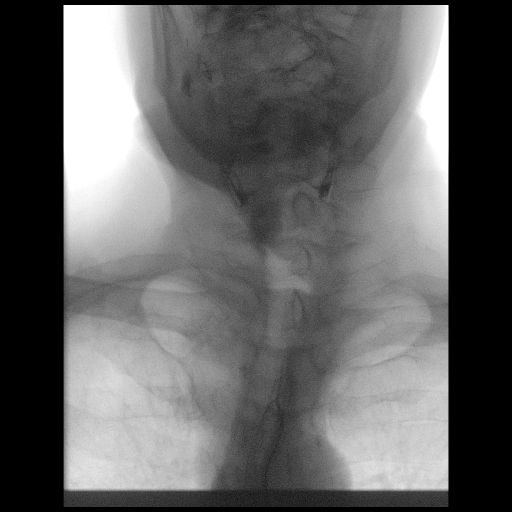

[Series 3: cp_standard · 0.51mm/px · 1 of 35 frames shown (3 of 12)]
[frame 30/35]
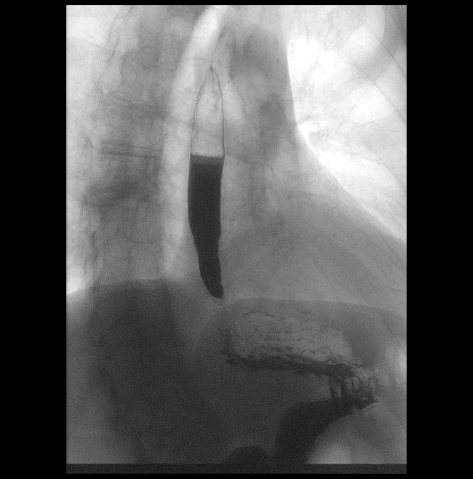

[Series 4: cp_standard · 0.51mm/px · 1 of 44 frames shown (4 of 12)]
[frame 23/44]
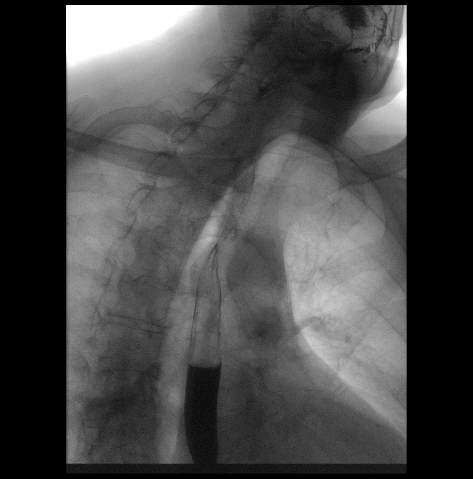

[Series 5: cp_standard · 0.51mm/px · 2 of 43 frames shown (5 of 12)]
[frame 7/43]
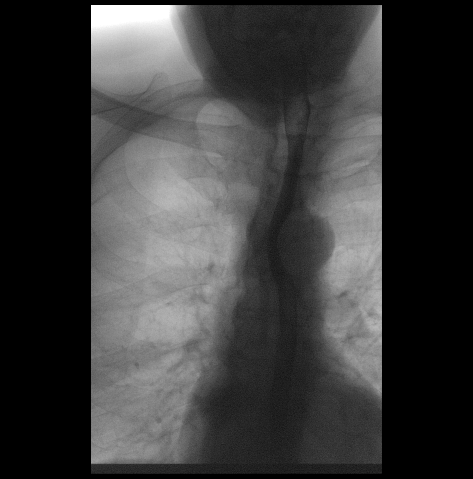
[frame 37/43]
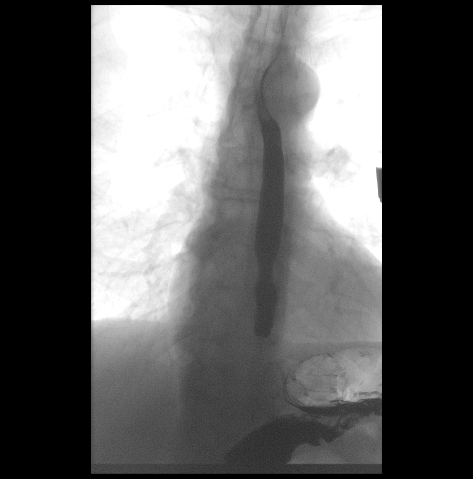

[Series 8: cp_standard · 0.53mm/px · 1 of 50 frames shown (6 of 12)]
[frame 26/50]
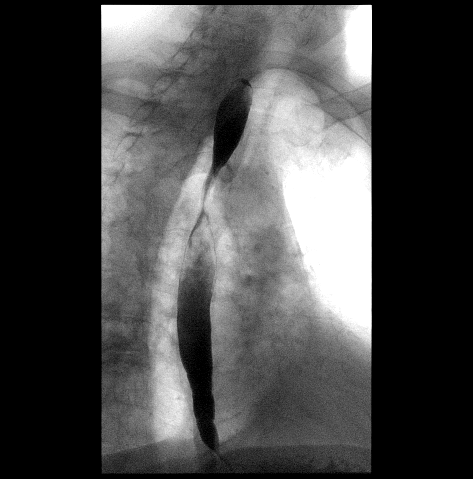

[Series 9: cp_standard · 0.55mm/px · 1 of 60 frames shown (7 of 12)]
[frame 10/60]
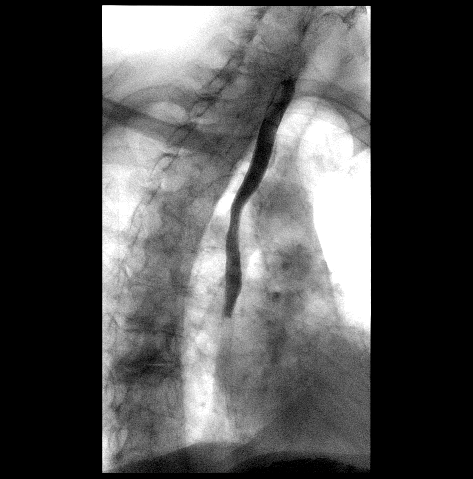

[Series 10: cp_standard · 0.34mm/px · 2 of 28 frames shown (8 of 12)]
[frame 8/28]
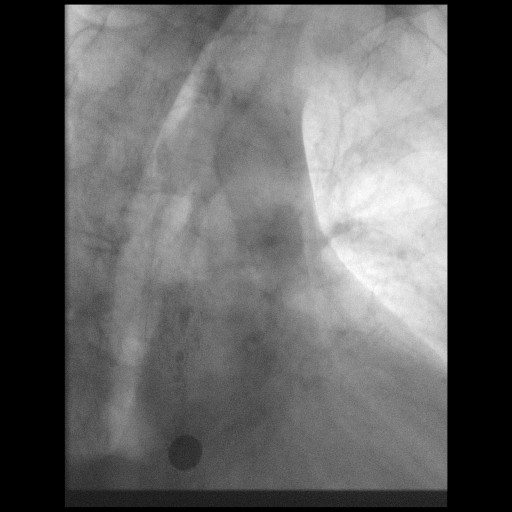
[frame 24/28]
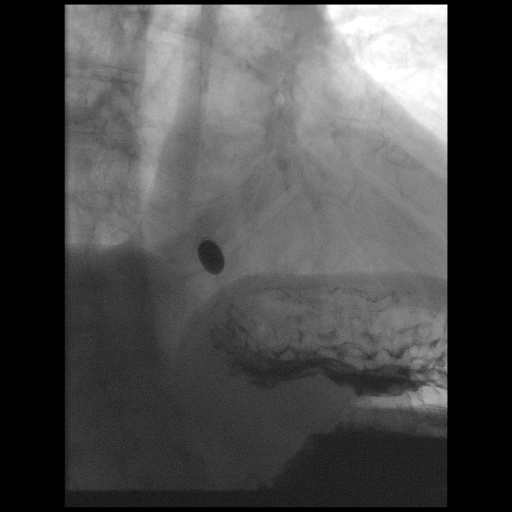

[Series 11: cp_standard · 0.34mm/px · 1 of 35 frames shown (9 of 12)]
[frame 34/35]
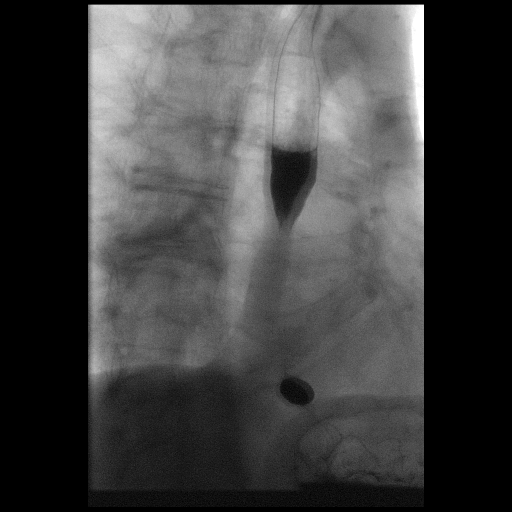

[Series 13: cp_standard · 0.34mm/px · 1 of 22 frames shown (10 of 12)]
[frame 3/22]
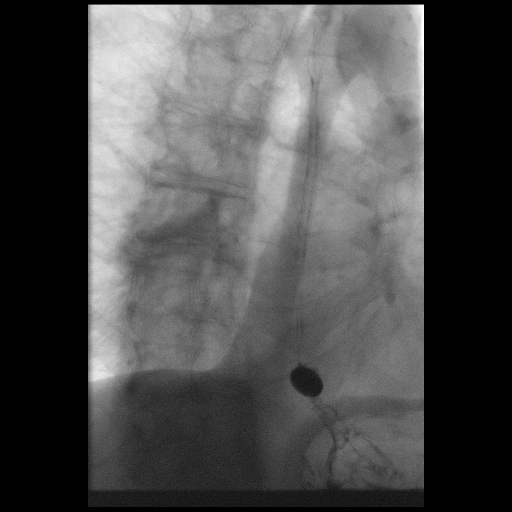

[Series 14: cp_standard · 0.34mm/px · 2 of 7 frames shown (11 of 12)]
[frame 4/7]
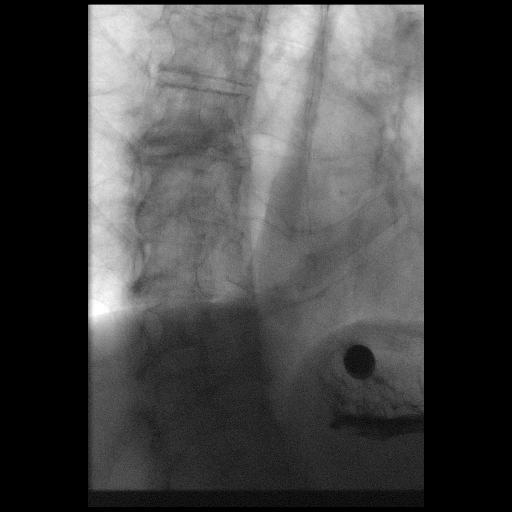
[frame 7/7]
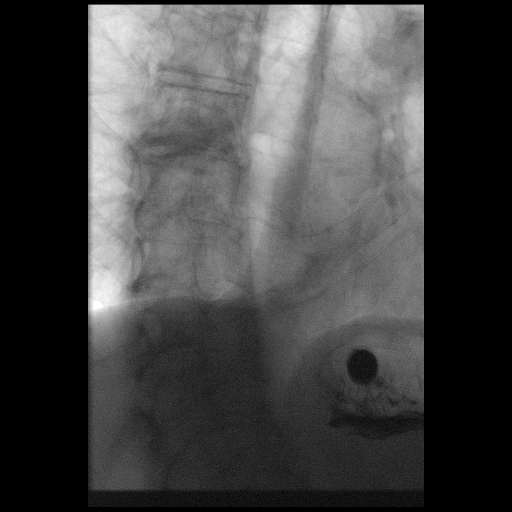

[Series 15: cp_standard · 0.34mm/px · 1 of 22 frames shown (12 of 12)]
[frame 19/22]
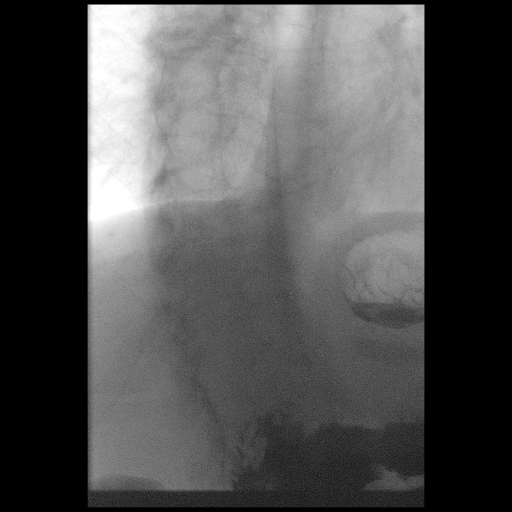

[16 of 24 positions shown; findings below may reference images not displayed]

FINDINGS: Due to some weakness and dyspnea difficulty swallowing larger
quantities of contrast material the study was limited to single
contrast, thin barium.

No gross aspiration seen during limited swallow assessment.

There is some mild mucosal irregularity though assessment limited
based on thin barium evaluation.

Assessment of motility shows intact primary wave with some proximal
is [place] of the ingested bolus and subsequent tertiary peristalsis.

Narrowing of the distal esophagus and small hiatal hernia. Patient
was instructed to swallow a barium tablet which became lodged at the
GE junction passing after 1-2 minutes of visualization. Images were
obtained utilizing thin barium at the level of narrowing around the
tablet. The patient experienced a globus sensation during this
interval.
IMPRESSION: Technically limited study as outlined above.

Distal esophageal narrowing, perhaps stricture as described.

Mild mucosal irregularity may be indicative of recurrent candidal
esophagitis, direct visualization may be helpful.

## 2019-06-04 ENCOUNTER — Inpatient Hospital Stay: Payer: Medicaid Other | Admitting: Adult Health

## 2019-06-05 ENCOUNTER — Other Ambulatory Visit: Payer: Self-pay

## 2019-06-05 ENCOUNTER — Observation Stay (HOSPITAL_COMMUNITY)
Admission: EM | Admit: 2019-06-05 | Discharge: 2019-06-06 | Disposition: A | Discharge status: Home / Self Care | Payer: Medicaid Other | Attending: Internal Medicine | Admitting: Internal Medicine

## 2019-06-05 ENCOUNTER — Emergency Department (HOSPITAL_COMMUNITY): Payer: Medicaid Other

## 2019-06-05 DIAGNOSIS — J383 Other diseases of vocal cords: Secondary | ICD-10-CM | POA: Diagnosis not present

## 2019-06-05 DIAGNOSIS — Z7982 Long term (current) use of aspirin: Secondary | ICD-10-CM | POA: Insufficient documentation

## 2019-06-05 DIAGNOSIS — Z7951 Long term (current) use of inhaled steroids: Secondary | ICD-10-CM | POA: Diagnosis not present

## 2019-06-05 DIAGNOSIS — Z79899 Other long term (current) drug therapy: Secondary | ICD-10-CM | POA: Insufficient documentation

## 2019-06-05 DIAGNOSIS — M069 Rheumatoid arthritis, unspecified: Secondary | ICD-10-CM | POA: Diagnosis present

## 2019-06-05 DIAGNOSIS — F17210 Nicotine dependence, cigarettes, uncomplicated: Secondary | ICD-10-CM | POA: Insufficient documentation

## 2019-06-05 DIAGNOSIS — I6381 Other cerebral infarction due to occlusion or stenosis of small artery: Secondary | ICD-10-CM | POA: Diagnosis not present

## 2019-06-05 DIAGNOSIS — Z7289 Other problems related to lifestyle: Secondary | ICD-10-CM | POA: Insufficient documentation

## 2019-06-05 DIAGNOSIS — Z7952 Long term (current) use of systemic steroids: Secondary | ICD-10-CM | POA: Diagnosis not present

## 2019-06-05 DIAGNOSIS — I639 Cerebral infarction, unspecified: Secondary | ICD-10-CM | POA: Diagnosis present

## 2019-06-05 DIAGNOSIS — I6782 Cerebral ischemia: Secondary | ICD-10-CM | POA: Diagnosis not present

## 2019-06-05 DIAGNOSIS — Z888 Allergy status to other drugs, medicaments and biological substances status: Secondary | ICD-10-CM | POA: Diagnosis not present

## 2019-06-05 DIAGNOSIS — F1410 Cocaine abuse, uncomplicated: Secondary | ICD-10-CM | POA: Diagnosis not present

## 2019-06-05 DIAGNOSIS — J449 Chronic obstructive pulmonary disease, unspecified: Secondary | ICD-10-CM | POA: Insufficient documentation

## 2019-06-05 DIAGNOSIS — M6281 Muscle weakness (generalized): Secondary | ICD-10-CM | POA: Diagnosis not present

## 2019-06-05 DIAGNOSIS — J4489 Other specified chronic obstructive pulmonary disease: Secondary | ICD-10-CM | POA: Diagnosis present

## 2019-06-05 DIAGNOSIS — I69351 Hemiplegia and hemiparesis following cerebral infarction affecting right dominant side: Secondary | ICD-10-CM | POA: Insufficient documentation

## 2019-06-05 DIAGNOSIS — Z20828 Contact with and (suspected) exposure to other viral communicable diseases: Secondary | ICD-10-CM | POA: Insufficient documentation

## 2019-06-05 DIAGNOSIS — Z716 Tobacco abuse counseling: Secondary | ICD-10-CM | POA: Insufficient documentation

## 2019-06-05 DIAGNOSIS — I10 Essential (primary) hypertension: Secondary | ICD-10-CM | POA: Diagnosis present

## 2019-06-05 DIAGNOSIS — E785 Hyperlipidemia, unspecified: Secondary | ICD-10-CM | POA: Insufficient documentation

## 2019-06-05 DIAGNOSIS — Z8249 Family history of ischemic heart disease and other diseases of the circulatory system: Secondary | ICD-10-CM | POA: Diagnosis not present

## 2019-06-05 LAB — CBC WITH DIFFERENTIAL/PLATELET
Abs Immature Granulocytes: 0.07 10*3/uL (ref 0.00–0.07)
Basophils Absolute: 0.1 10*3/uL (ref 0.0–0.1)
Basophils Relative: 1 %
Eosinophils Absolute: 0.2 10*3/uL (ref 0.0–0.5)
Eosinophils Relative: 2 %
HCT: 39.2 % (ref 39.0–52.0)
Hemoglobin: 14.2 g/dL (ref 13.0–17.0)
Immature Granulocytes: 1 %
Lymphocytes Relative: 16 %
Lymphs Abs: 1.5 10*3/uL (ref 0.7–4.0)
MCH: 34 pg (ref 26.0–34.0)
MCHC: 36.2 g/dL — ABNORMAL HIGH (ref 30.0–36.0)
MCV: 93.8 fL (ref 80.0–100.0)
Monocytes Absolute: 0.6 10*3/uL (ref 0.1–1.0)
Monocytes Relative: 7 %
Neutro Abs: 6.6 10*3/uL (ref 1.7–7.7)
Neutrophils Relative %: 73 %
Platelets: 353 10*3/uL (ref 150–400)
RBC: 4.18 MIL/uL — ABNORMAL LOW (ref 4.22–5.81)
RDW: 13.1 % (ref 11.5–15.5)
WBC: 9 10*3/uL (ref 4.0–10.5)
nRBC: 0 % (ref 0.0–0.2)

## 2019-06-05 LAB — RAPID URINE DRUG SCREEN, HOSP PERFORMED
Amphetamines: NOT DETECTED
Barbiturates: NOT DETECTED
Benzodiazepines: NOT DETECTED
Cocaine: NOT DETECTED
Opiates: NOT DETECTED
Tetrahydrocannabinol: NOT DETECTED

## 2019-06-05 LAB — COMPREHENSIVE METABOLIC PANEL
ALT: 32 U/L (ref 0–44)
AST: 25 U/L (ref 15–41)
Albumin: 4.1 g/dL (ref 3.5–5.0)
Alkaline Phosphatase: 65 U/L (ref 38–126)
Anion gap: 12 (ref 5–15)
BUN: 9 mg/dL (ref 8–23)
CO2: 26 mmol/L (ref 22–32)
Calcium: 9.5 mg/dL (ref 8.9–10.3)
Chloride: 95 mmol/L — ABNORMAL LOW (ref 98–111)
Creatinine, Ser: 0.77 mg/dL (ref 0.61–1.24)
GFR calc Af Amer: >60 mL/min (ref >60)
GFR calc non Af Amer: >60 mL/min (ref >60)
Glucose, Bld: 92 mg/dL (ref 70–99)
Potassium: 3.5 mmol/L (ref 3.5–5.1)
Sodium: 133 mmol/L — ABNORMAL LOW (ref 135–145)
Total Bilirubin: 0.6 mg/dL (ref 0.3–1.2)
Total Protein: 7.2 g/dL (ref 6.5–8.1)

## 2019-06-05 LAB — URINALYSIS, ROUTINE W REFLEX MICROSCOPIC
Bilirubin Urine: NEGATIVE
Glucose, UA: NEGATIVE mg/dL
Hgb urine dipstick: NEGATIVE
Ketones, ur: NEGATIVE mg/dL
Leukocytes,Ua: NEGATIVE
Nitrite: NEGATIVE
Protein, ur: NEGATIVE mg/dL
Specific Gravity, Urine: 1.001 — ABNORMAL LOW (ref 1.005–1.030)
pH: 7 (ref 5.0–8.0)

## 2019-06-05 LAB — PROTIME-INR
INR: 0.9 (ref 0.8–1.2)
Prothrombin Time: 11.6 seconds (ref 11.4–15.2)

## 2019-06-05 LAB — ETHANOL: Alcohol, Ethyl (B): <10 mg/dL (ref <10)

## 2019-06-05 LAB — APTT: aPTT: 29 seconds (ref 24–36)

## 2019-06-05 LAB — CBG MONITORING, ED: Glucose-Capillary: 98 mg/dL (ref 70–99)

## 2019-06-05 IMAGING — CT CT HEAD W/O CM
4 series · 16 of 47 positions shown, 18 images · non-contrast
Comparison: Brain MRI [DATE], head CT and CT angiography
head/neck [DATE]

CLINICAL DATA: Altered level of consciousness (LOC) unexplained.
Additional history provided: Patient acting more confused over the
past week, abnormal behavior is, walking encircles, increasing
altered mental status. History of recent stroke with left-sided
deficit

EXAM:
CT HEAD WITHOUT CONTRAST
TECHNIQUE: Contiguous axial images were obtained from the base of the skull
through the vertex without intravenous contrast.

[Series 3: head without · axial · non-contrast · 0.43mm/px · z∈[-164,-19]mm · 7 of 39 slices shown, 9 images]
[im 5/39  brain]
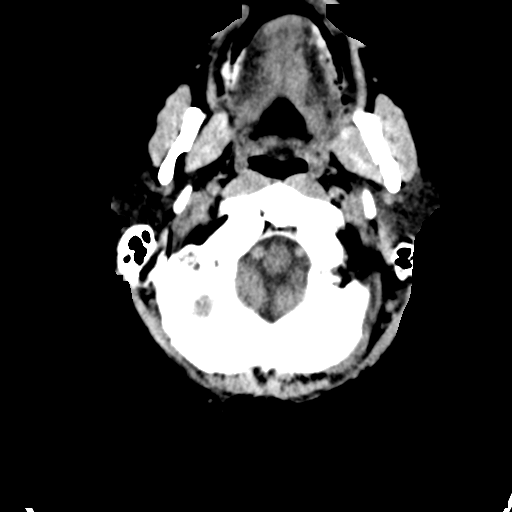
[im 5/39  bone]
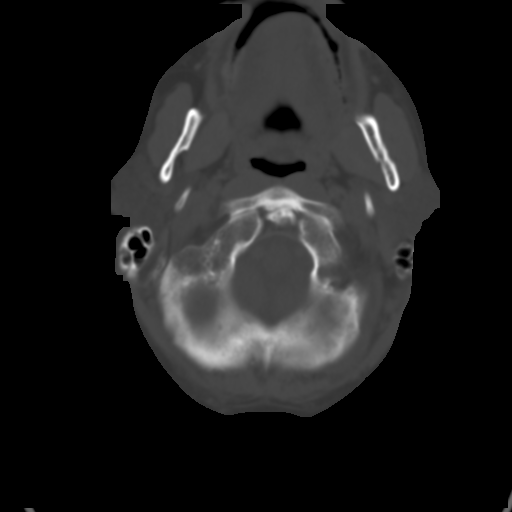
[im 10/39  brain]
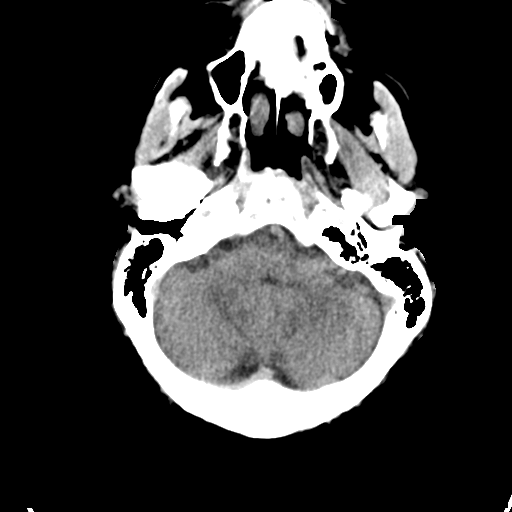
[im 15/39  brain]
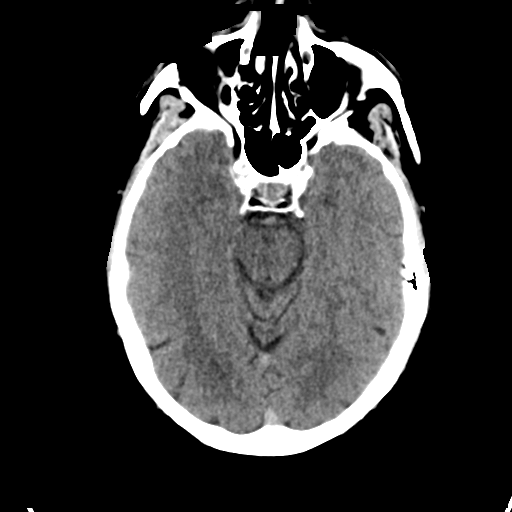
[im 20/39  brain]
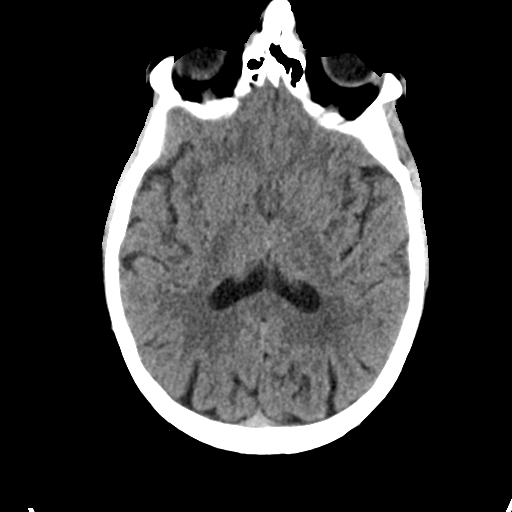
[im 24/39  brain]
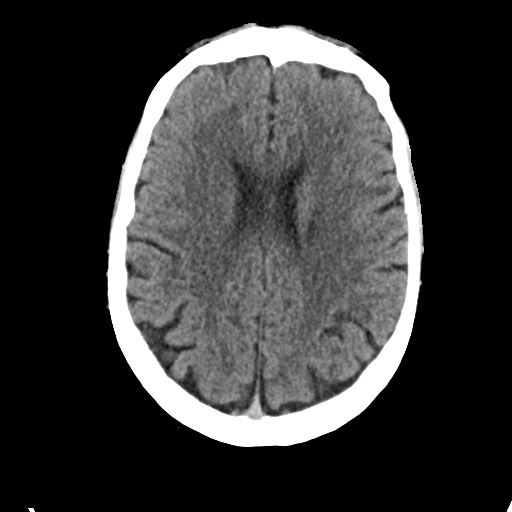
[im 24/39  bone]
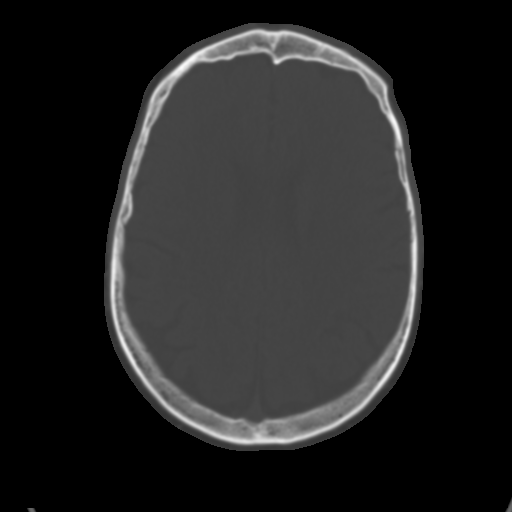
[im 29/39  brain]
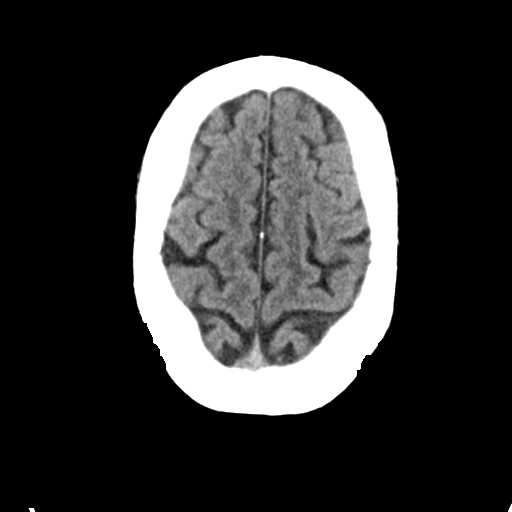
[im 34/39  brain]
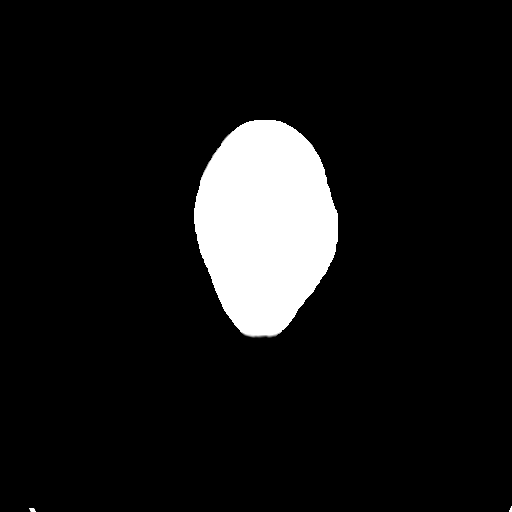

[Series 4: head bone · axial · 0.43mm/px · z∈[-166,-128]mm · 3 of 96 slices shown]
[im 10/96  bone]
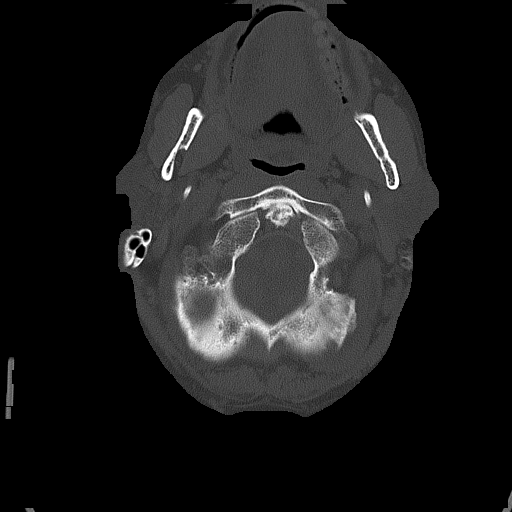
[im 20/96  bone]
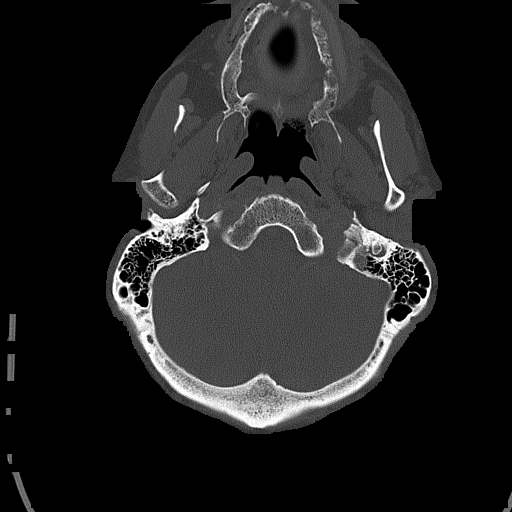
[im 29/96  bone]
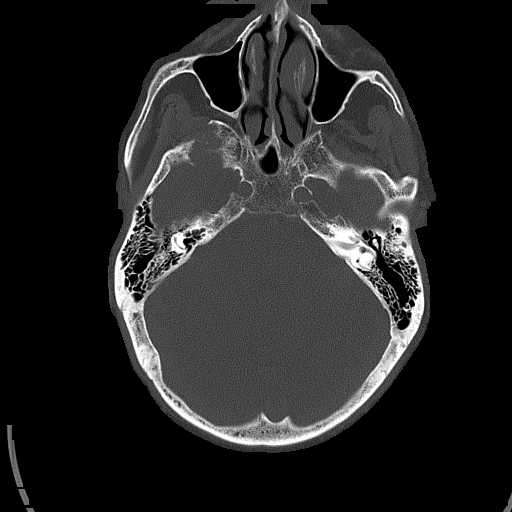

[Series 5: head without cor · coronal · non-contrast · 0.35mm/px · 3 of 70 slices shown]
[im 24/70  brain]
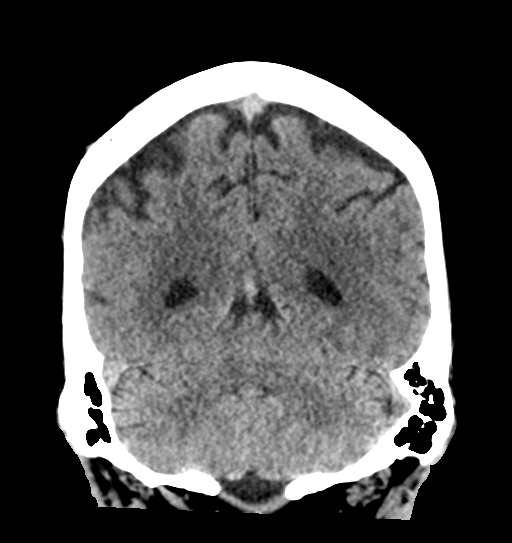
[im 31/70  brain]
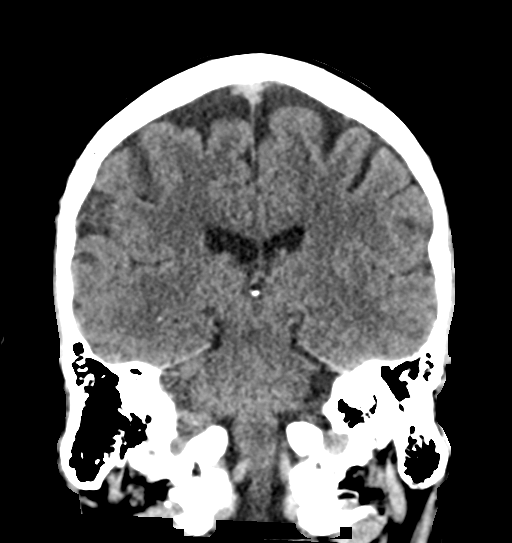
[im 39/70  brain]
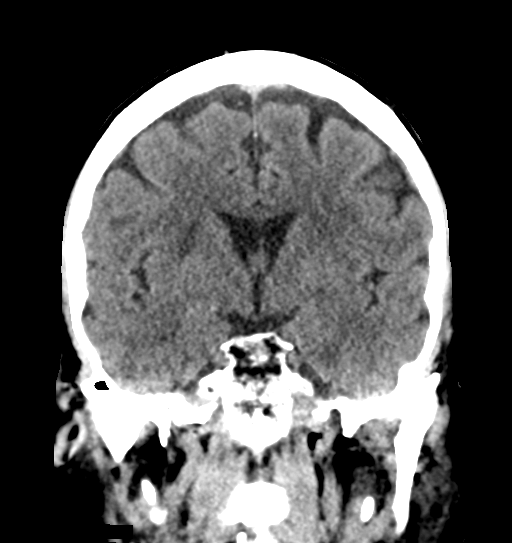

[Series 6: head without sag · sagittal · non-contrast · 0.37mm/px · 3 of 58 slices shown]
[im 20/58  brain]
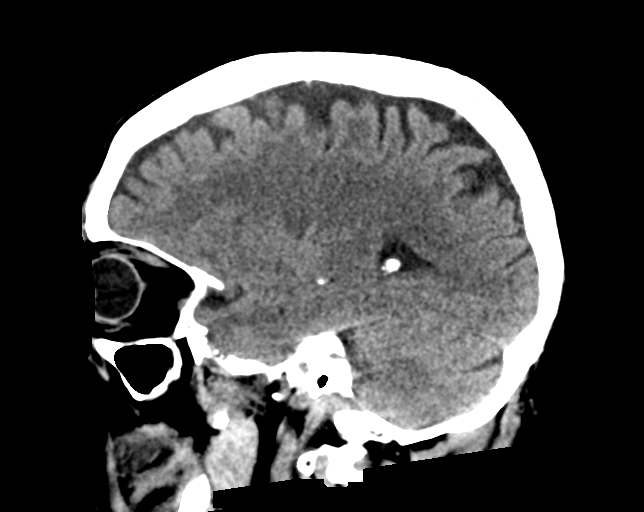
[im 29/58  brain]
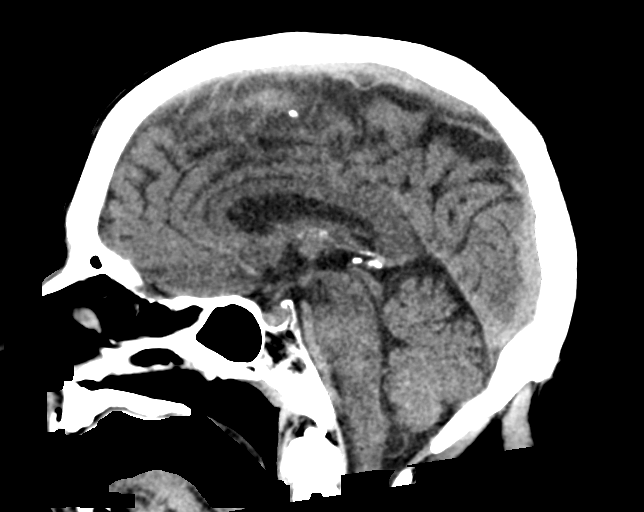
[im 39/58  brain]
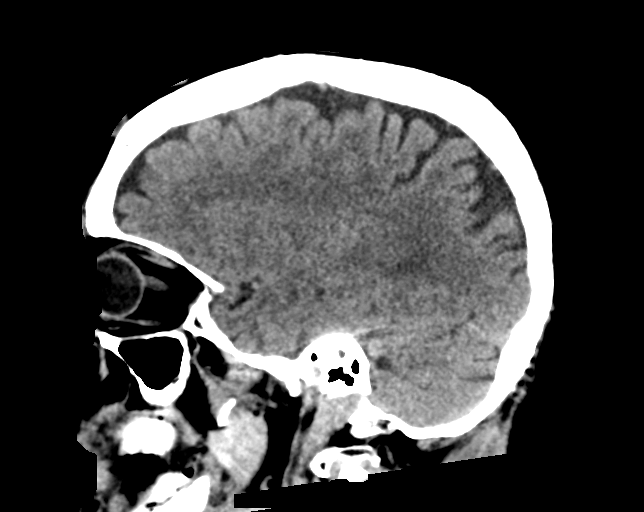

[16 of 47 positions shown; findings below may reference images not displayed]

FINDINGS: Brain:

There is no acute intracranial hemorrhage or demarcated cortical
infarction. No evidence of intracranial mass. No midline shift or
extra-axial fluid collection.

New from prior MRI [DATE], there is an infarct within the right
corona radiata/internal capsule possibly extending inferiorly to
involve the right basal ganglia (series 3, image 23). Redemonstrated
now chronic infarct within the left corona radiata/internal capsule
and basal ganglia. There is otherwise stable chronic small vessel
ischemic disease. Cerebral volume is normal for age.

Vascular: No hyperdense vessel. Atherosclerotic calcification of the
carotid artery siphons.

Skull: No calvarial fracture

Sinuses/Orbits: Visualized orbits demonstrate no acute abnormality.
No significant paranasal sinus disease or mastoid effusion.

These results were called by telephone at the time of interpretation
on [DATE] at [DATE] to provider BLAIN , who verbally
acknowledged these results.
IMPRESSION: 1. An infarct centered within the right corona radiata/internal
capsule is new as compared to MRI [DATE], but otherwise
age-indeterminate. Consider MRI for further evaluation.
2. Redemonstrated now chronic infarct within the left corona
radiata/internal capsule and basal ganglia.
3. Otherwise stable chronic small vessel ischemic disease.

## 2019-06-05 MED ORDER — LORAZEPAM 2 MG/ML IJ SOLN
1.0000 mg | Freq: Once | INTRAMUSCULAR | Status: AC | PRN
  Administered 2019-06-06: 1 mg via INTRAVENOUS
  Filled 2019-06-05: qty 1

## 2019-06-05 NOTE — ED Triage Notes (Signed)
Pt BIB EMS with AMS. Pt had a recent stroke, unknown date. Per EMS pt has no No cognitive deficits, only left side and fine motor deficits. Over the past week, pt has been acting more confused, abn behaviors, walking in circles, increasing AMS. No current stroke sx. Pt just began taking Chantix day and night. Pt has hx of HTN and COPD. Pt seems unable to recall tasks that he normally can. Pt is possibly being evaluated for esophageal cancer.

## 2019-06-05 NOTE — ED Provider Notes (Signed)
[facility] EMERGENCY DEPARTMENT Provider Note   CSN: [id number] Arrival date & time: 06/05/19  2138     History   Chief Complaint Chief Complaint  Patient presents with  . Altered Mental Status    HPI [name] is a 63 y.o. male.     HPI  63 year old male presents with confusion/disorientation.  He states has been feeling this way since his stroke which was at the end of August.  The patient tells me that he has been feeling this way but it seemed to get worse yesterday.  He has had a continuous headache that waxes and wanes since the stroke.  He has been compliant with his medicines.  He has a hard time describing what he means by confused/disoriented but it seems like he cannot always answer questions appropriately.  He feels like he is stuttering.  He has diffuse weakness but does not feel like there is any new weakness except that the weakness is getting worse.  No double vision.  No fever, chest pain, shortness of breath.  He has a chronic cough.  Past Medical History:  Diagnosis Date  . COPD (chronic obstructive pulmonary disease) ([place])   . Current smoker   . Depression   . Hypertension   . RA (rheumatoid arthritis) ([name])   . Tobacco abuse     Patient Active Problem List   Diagnosis Date Noted  . CVA (cerebral vascular accident) ([place]) 04/19/2019  . Essential hypertension 04/19/2019  . Cocaine abuse ([place]) 04/19/2019  . Adjustment disorder 04/19/2019  . Dysphagia 10/25/2018  . Tobacco abuse 09/13/2018  . COPD GOLD I, still smoking 09/13/2018  . Rheumatoid arthritis ([place]) 09/13/2018    Past Surgical History:  Procedure Laterality Date  . ANKLE SURGERY    . MULTIPLE TOOTH EXTRACTIONS          Home Medications    Prior to Admission medications   Medication Sig Start Date End Date Taking? Authorizing Provider  acetaminophen (TYLENOL) 500 MG tablet Take 500 mg by mouth every 6 (six) hours as needed for mild pain, fever or headache.      [provider]  amLODipine (NORVASC) 5 MG tablet Take 1 tablet (5 mg total) by mouth daily. 04/20/19 04/19/20  Edwin Dada, MD  aspirin EC 81 MG tablet Take 1 tablet (81 mg total) by mouth daily. 04/20/19 04/19/20  Danford, Suann Larry, MD  COMBIVENT RESPIMAT 20-100 MCG/ACT AERS respimat Inhale 1 puff into the lungs 4 (four) times daily. 03/23/19   [provider]  folic acid (FOLVITE) 1 MG tablet Take 1 mg by mouth daily.    [provider]  hydrochlorothiazide (MICROZIDE) 12.5 MG capsule Take 1 capsule (12.5 mg total) by mouth daily. 04/20/19 04/19/20  Danford, Suann Larry, MD  nicotine (NICODERM CQ - DOSED IN MG/24 HOURS) 14 mg/24hr patch Place 1 patch (14 mg total) onto the skin daily. 04/21/19   Danford, Suann Larry, MD  omeprazole (PRILOSEC) 20 MG capsule Take 1 capsule (20 mg total) by mouth 2 (two) times daily for 10 days. 02/03/19 04/19/19  Doran Stabler, MD  predniSONE (DELTASONE) 10 MG tablet Take 5-10 mg by mouth See admin instructions. 10mg  in the morning and 5mg  in the evening 08/23/18   [provider]  PROAIR HFA 108 (90 Base) MCG/ACT inhaler Inhale 1-2 puffs into the lungs every 4 (four) hours as needed for wheezing or shortness of breath.  07/15/18   [provider]  SYMBICORT 160-4.5 MCG/ACT inhaler Inhale 2 puffs into the lungs 2 (two) times daily. 03/23/19   [provider]  tamsulosin (FLOMAX) 0.4 MG CAPS capsule Take 0.4 mg by mouth daily.  07/11/18   [provider]    Family History Family History  Problem Relation Age of Onset  . Hypertension Mother   . Heart attack Mother   . Heart attack Father   . Brain cancer Brother   . Colon cancer Maternal Aunt   . Stomach cancer Neg Hx   . Esophageal cancer Neg Hx   . Pancreatic cancer Neg Hx     Social History Social History   Tobacco Use  . Smoking status: Current Every Day Smoker    Packs/day: 1.50    Years: 48.00    Pack years: 72.00     Types: Cigarettes  . Smokeless tobacco: Never Used  . Tobacco comment: 10/25/18 1 to 1 1/2 ppd  Substance Use Topics  . Alcohol use: Yes    Comment: occ  . Drug use: Yes    Types: Cocaine, Benzodiazepines     Allergies   Celexa [citalopram]   Review of Systems Review of Systems  Respiratory: Positive for cough. Negative for shortness of breath.   Cardiovascular: Negative for chest pain.  Gastrointestinal: Negative for abdominal pain and vomiting.  Neurological: Positive for speech difficulty, weakness and headaches.  All other systems reviewed and are negative.    Physical Exam Updated Vital Signs BP (!) 183/99   Pulse 81   Temp (!) 97.5 F (36.4 C)   Resp 17   Ht 5\' 6"  (1.676 m)   Wt 63.5 kg   SpO2 99%   BMI 22.60 kg/m   Physical Exam Vitals signs and nursing note reviewed.  Constitutional:      Appearance: He is well-developed. He is not ill-appearing or diaphoretic.  HENT:     Head: Normocephalic and atraumatic.     Right Ear: External ear normal.     Left Ear: External ear normal.     Nose: Nose normal.  Eyes:     General:        Right eye: No discharge.        Left eye: No discharge.     Extraocular Movements: Extraocular movements intact.     Pupils: Pupils are equal, round, and reactive to light.  Neck:     Musculoskeletal: Neck supple.  Cardiovascular:     Rate and Rhythm: Normal rate and regular rhythm.     Heart sounds: Normal heart sounds.  Pulmonary:     Effort: Pulmonary effort is normal.     Breath sounds: Normal breath sounds.  Abdominal:     Palpations: Abdomen is soft.     Tenderness: There is no abdominal tenderness.  Skin:    General: Skin is warm and dry.  Neurological:     Mental Status: He is alert and oriented to person, place, and time.     Comments: Awake, alert, oriented to person, place, time and situation. CN 3-12 grossly intact. 5/5 strength in all 4 extremities, though maybe a little weaker in the LLE. Grossly normal  sensation. Normal finger to nose.  Occasionally has stuttering speech  Psychiatric:        Mood and Affect: Mood is not anxious.      ED Treatments / Results  Labs (all labs ordered are listed, but only abnormal results are displayed) Labs Reviewed  COMPREHENSIVE METABOLIC PANEL - Abnormal; Notable for  the following components:      Result Value   Sodium 133 (*)    Chloride 95 (*)    All other components within normal limits  URINALYSIS, ROUTINE W REFLEX MICROSCOPIC - Abnormal; Notable for the following components:   Color, Urine COLORLESS (*)    Specific Gravity, Urine 1.001 (*)    All other components within normal limits  CBC WITH DIFFERENTIAL/PLATELET - Abnormal; Notable for the following components:   RBC 4.18 (*)    MCHC 36.2 (*)    All other components within normal limits  ETHANOL  PROTIME-INR  APTT  RAPID URINE DRUG SCREEN, HOSP PERFORMED  CBG MONITORING, ED    EKG EKG Interpretation  Date/Time:  Thursday June 05 2019 21:48:05 EDT Ventricular Rate:  80 PR Interval:    QRS Duration: 90 QT Interval:  382 QTC Calculation: 441 R Axis:   -42 Text Interpretation:  Sinus rhythm Left axis deviation Baseline wander in lead(s) V3 V5 no significant change since Aug 2020 Confirmed by Sherwood Gambler [id number]) on 06/05/2019 11:14:56 PM   Radiology Ct Head Wo Contrast  Result Date: 06/05/2019 CLINICAL DATA:  Altered level of consciousness (LOC) unexplained. Additional history provided: Patient acting more confused over the past week, abnormal behavior is, walking encircles, increasing altered mental status. History of recent stroke with left-sided deficit EXAM: CT HEAD WITHOUT CONTRAST TECHNIQUE: Contiguous axial images were obtained from the base of the skull through the vertex without intravenous contrast. COMPARISON:  Brain MRI 04/19/2019, head CT and CT angiography head/neck 04/19/2019 FINDINGS: Brain: There is no acute intracranial hemorrhage or demarcated cortical  infarction. No evidence of intracranial mass. No midline shift or extra-axial fluid collection. New from prior MRI 04/19/2019, there is an infarct within the right corona radiata/internal capsule possibly extending inferiorly to involve the right basal ganglia (series 3, image 23). Redemonstrated now chronic infarct within the left corona radiata/internal capsule and basal ganglia. There is otherwise stable chronic small vessel ischemic disease. Cerebral volume is normal for age. Vascular: No hyperdense vessel. Atherosclerotic calcification of the carotid artery siphons. Skull: No calvarial fracture Sinuses/Orbits: Visualized orbits demonstrate no acute abnormality. No significant paranasal sinus disease or mastoid effusion. These results were called by telephone at the time of interpretation on 06/05/2019 at 11:19 pm to provider Sherwood Gambler , who verbally acknowledged these results. IMPRESSION: 1. An infarct centered within the right corona radiata/internal capsule is new as compared to MRI 04/19/2019, but otherwise age-indeterminate. Consider MRI for further evaluation. 2. Redemonstrated now chronic infarct within the left corona radiata/internal capsule and basal ganglia. 3. Otherwise stable chronic small vessel ischemic disease. Electronically Signed   By: Kellie Simmering   On: 06/05/2019 23:22    Procedures Procedures (including critical care time)  Medications Ordered in ED Medications  LORazepam (ATIVAN) injection 1 mg (has no administration in time range)     Initial Impression / Assessment and Plan / ED Course  I have reviewed the triage vital signs and the nursing notes.  Pertinent labs & imaging results that were available during my care of the patient were reviewed by me and considered in my medical decision making (see chart for details).        I discussed with Dr. Leonel Ramsay.  At this point he recommends MRI with and without contrast given the concern for esophageal cancer that  is being worked up for.  At this point, it would depend on the timing of this possible new stroke.  If it was older  such as 2 weeks ago, may not need emergent treatment or change in meds.  However it appears more subacute or acute that he may need admission.  Care transferred to Dr. Leonette Monarch.  Final Clinical Impressions(s) / ED Diagnoses   Final diagnoses:  None    ED Discharge Orders    None       Sherwood Gambler, MD 06/05/19 2339

## 2019-06-06 ENCOUNTER — Emergency Department (HOSPITAL_COMMUNITY): Payer: Medicaid Other

## 2019-06-06 ENCOUNTER — Inpatient Hospital Stay (HOSPITAL_COMMUNITY): Payer: Medicaid Other

## 2019-06-06 ENCOUNTER — Other Ambulatory Visit (HOSPITAL_COMMUNITY): Payer: Medicaid Other

## 2019-06-06 ENCOUNTER — Inpatient Hospital Stay (HOSPITAL_BASED_OUTPATIENT_CLINIC_OR_DEPARTMENT_OTHER): Payer: Medicaid Other

## 2019-06-06 DIAGNOSIS — F1410 Cocaine abuse, uncomplicated: Secondary | ICD-10-CM | POA: Diagnosis not present

## 2019-06-06 DIAGNOSIS — I639 Cerebral infarction, unspecified: Secondary | ICD-10-CM

## 2019-06-06 DIAGNOSIS — J449 Chronic obstructive pulmonary disease, unspecified: Secondary | ICD-10-CM

## 2019-06-06 DIAGNOSIS — I10 Essential (primary) hypertension: Secondary | ICD-10-CM

## 2019-06-06 DIAGNOSIS — J383 Other diseases of vocal cords: Secondary | ICD-10-CM | POA: Diagnosis present

## 2019-06-06 DIAGNOSIS — M069 Rheumatoid arthritis, unspecified: Secondary | ICD-10-CM

## 2019-06-06 LAB — LIPID PANEL
Cholesterol: 155 mg/dL (ref 0–200)
HDL: 93 mg/dL (ref >40)
LDL Cholesterol: 53 mg/dL (ref 0–99)
Total CHOL/HDL Ratio: 1.7 RATIO
Triglycerides: 43 mg/dL (ref <150)
VLDL: 9 mg/dL (ref 0–40)

## 2019-06-06 LAB — HEMOGLOBIN A1C
Hgb A1c MFr Bld: 5.4 % (ref 4.8–5.6)
Mean Plasma Glucose: 108.28 mg/dL

## 2019-06-06 LAB — SARS CORONAVIRUS 2 (TAT 6-24 HRS): SARS Coronavirus 2: NEGATIVE

## 2019-06-06 IMAGING — CR DG CHEST 2V
4 series · 4 of 4 positions shown · non-contrast
Comparison: Radiograph [DATE], CT [DATE]

CLINICAL DATA: Acute ischemic stroke

EXAM:
CHEST - 2 VIEW

[chest lat (1 of 2)]
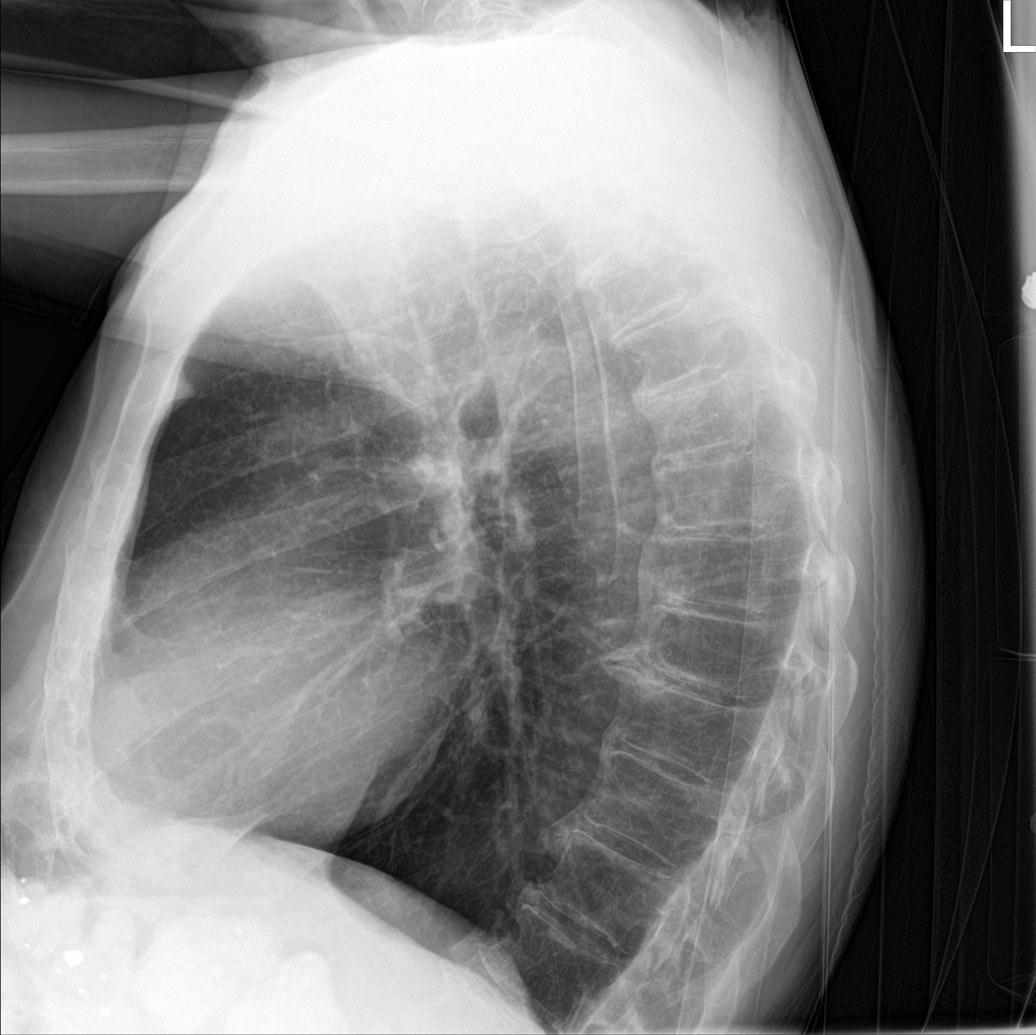

[chest ap (1 of 2)]
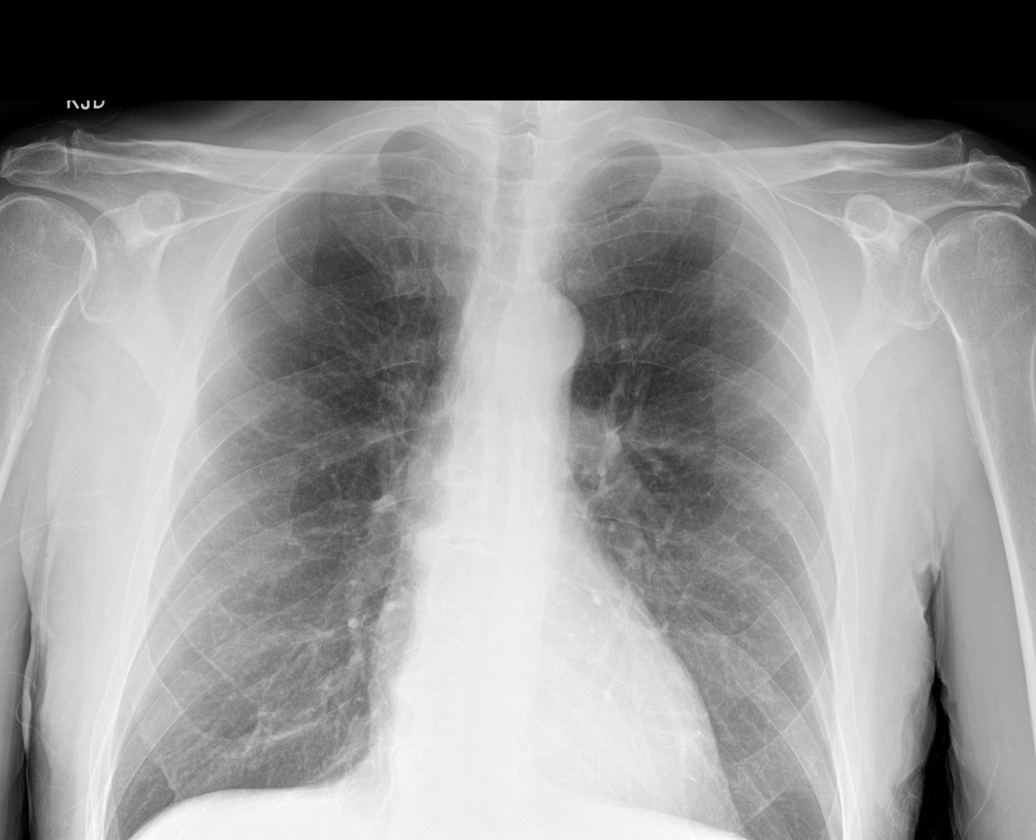

[chest lat (2 of 2)]
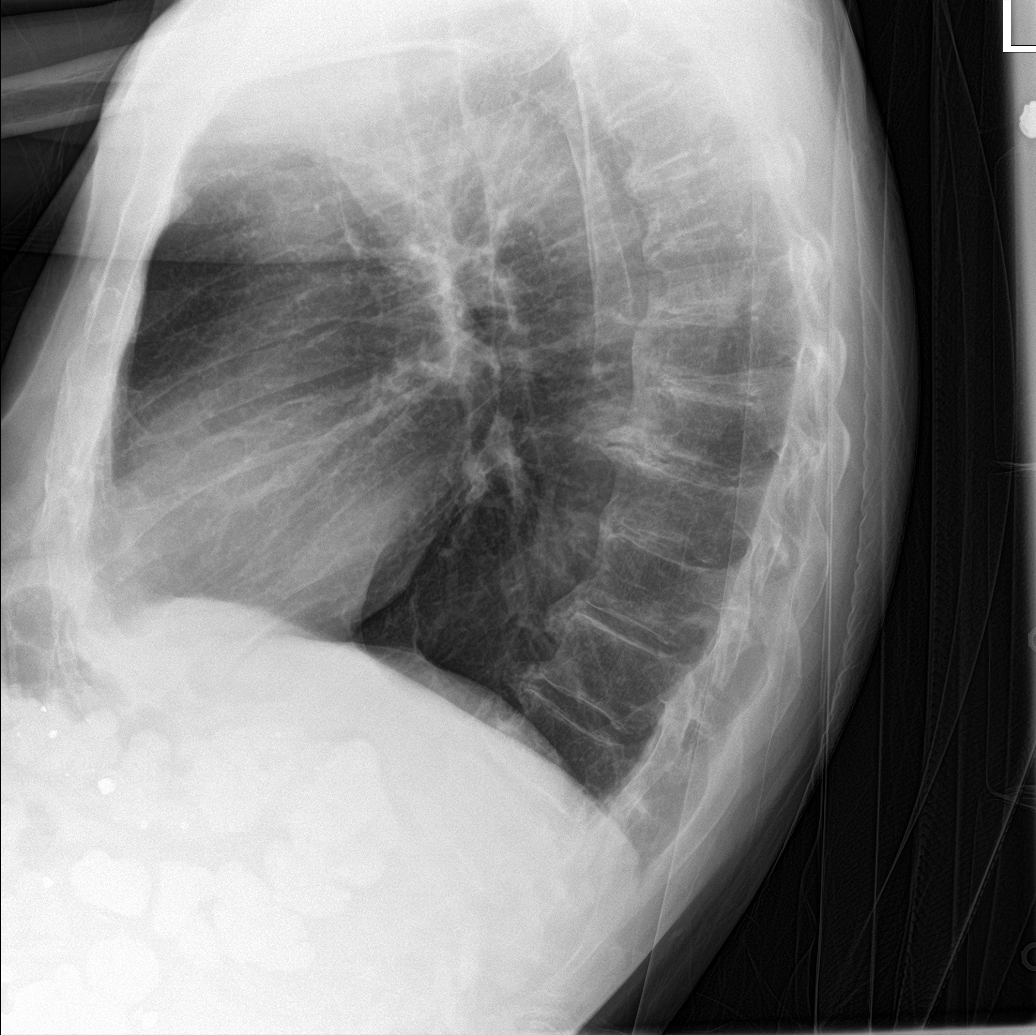

[chest ap (2 of 2)]
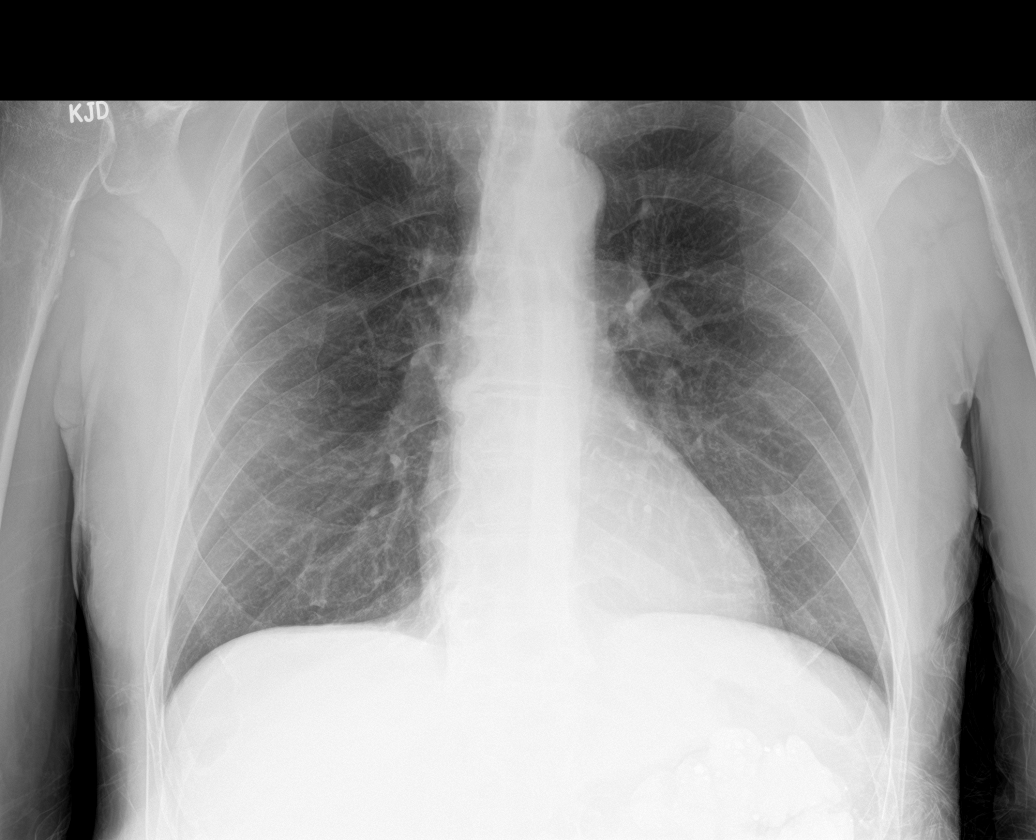

[4 of 4 positions shown; findings below may reference images not displayed]

FINDINGS: Chronic interstitial and bronchitic changes with hyperinflation of
the lungs and flattening of the diaphragms is similar to comparison.
No acute consolidative opacity. No pneumothorax or effusion. No
features of edema. Cardiomediastinal contours are stable from prior.
No acute osseous or soft tissue abnormality. Multilevel degenerative
changes are present in the imaged portions of the spine. Multilevel
flowing anterior osteophytosis compatible features of diffuse
idiopathic skeletal hyperostosis (DISH). Mild degenerative changes
noted in the acromioclavicular joints.
IMPRESSION: Chronic interstitial and bronchitic changes, similar to comparison.

No acute cardiopulmonary disease.

## 2019-06-06 IMAGING — MR MR HEAD W/O CM
7 series · 48 of 48 positions shown · non-contrast
Comparison: Head CT [DATE]. Brain MRI [DATE].

CLINICAL DATA: 62-year-old male with unexplained altered mental
status. Head CT earlier tonight suggesting new right corona radiata
small vessel infarct since [DATE].

EXAM:
MRI HEAD WITHOUT CONTRAST
TECHNIQUE: Multiplanar, multiecho pulse sequences of the brain and surrounding
structures were obtained without intravenous contrast.

[Series 5: DWI · axial · 3.0mm · 0.88mm/px · z∈[-75,+61]mm · 15 of 96 slices shown (1 of 4)]
[im 1/96]
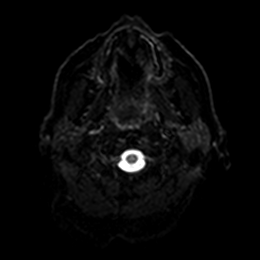
[im 7/96]
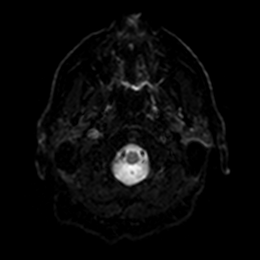
[im 14/96]
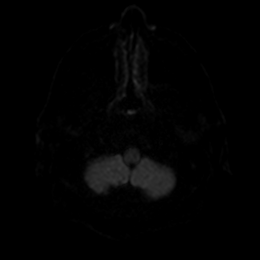
[im 21/96]
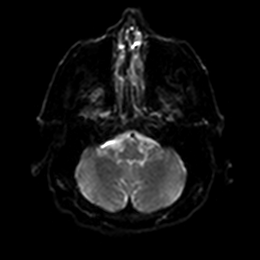
[im 28/96]
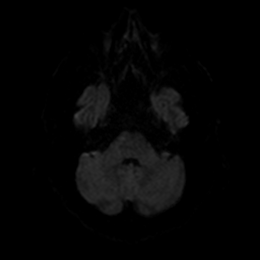
[im 34/96]
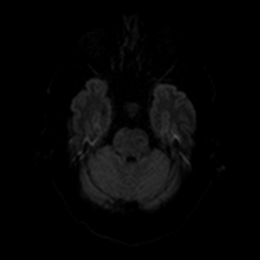
[im 41/96]
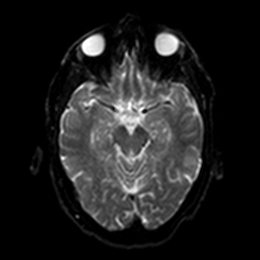
[im 48/96]
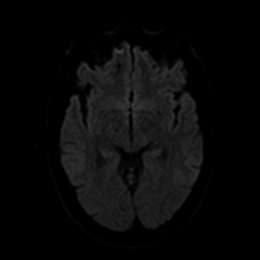
[im 55/96]
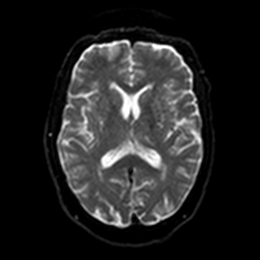
[im 62/96]
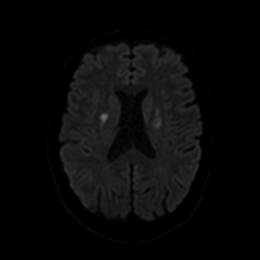
[im 68/96]
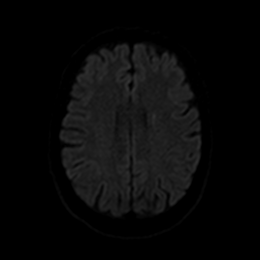
[im 75/96]
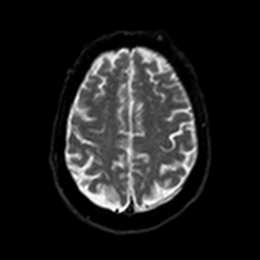
[im 82/96]
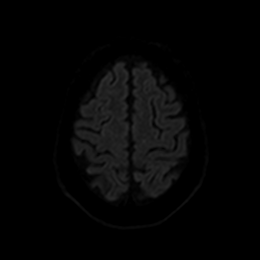
[im 89/96]
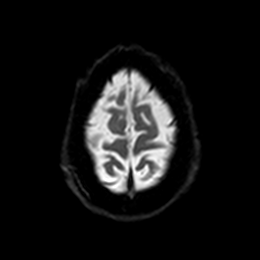
[im 96/96]
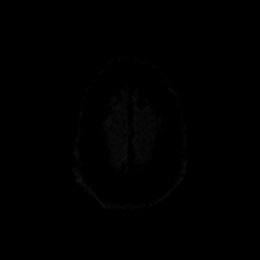

[Series 6: DWI · axial · 3.0mm · 0.88mm/px · z∈[-75,+61]mm · 7 of 48 slices shown (2 of 4)]
[im 1/48]
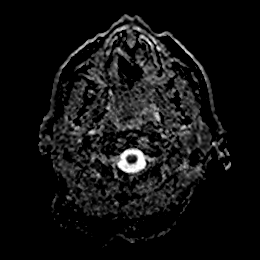
[im 8/48]
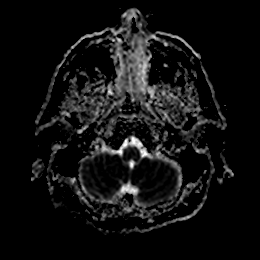
[im 16/48]
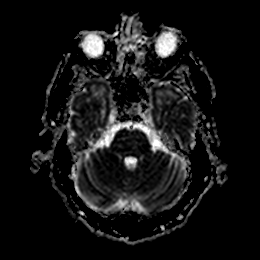
[im 24/48]
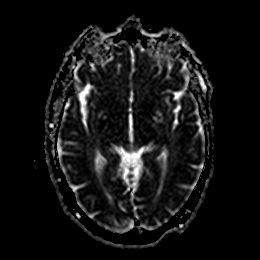
[im 32/48]
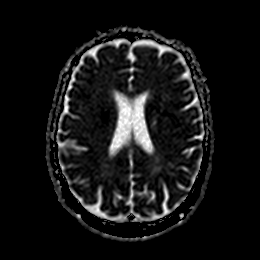
[im 40/48]
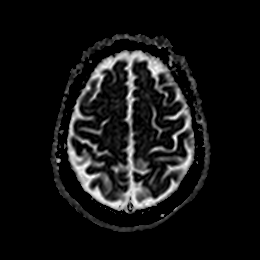
[im 48/48]
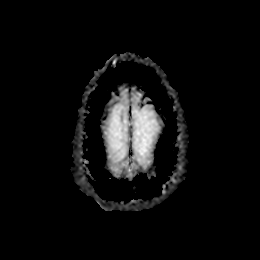

[Series 7: DWI · coronal · 4.0mm · 0.88mm/px · 10 of 70 slices shown (3 of 4)]
[im 1/70]
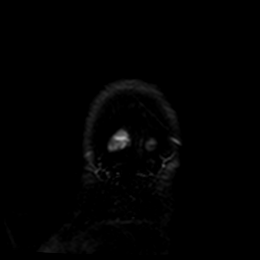
[im 8/70]
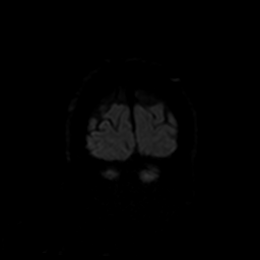
[im 16/70]
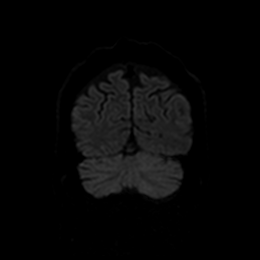
[im 24/70]
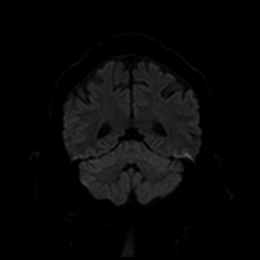
[im 31/70]
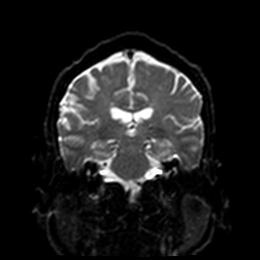
[im 39/70]
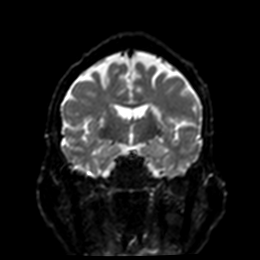
[im 47/70]
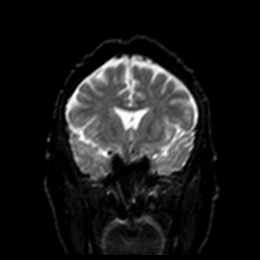
[im 54/70]
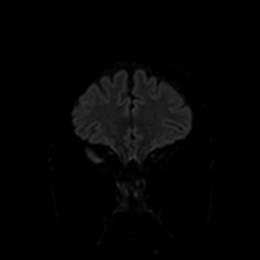
[im 62/70]
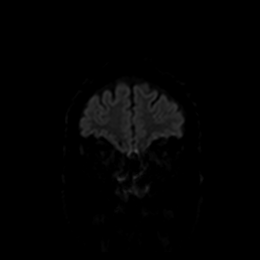
[im 70/70]
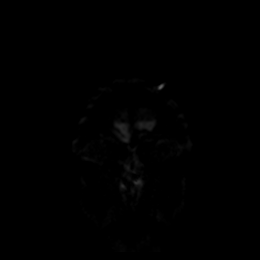

[Series 8: DWI · coronal · 4.0mm · 0.88mm/px · 5 of 35 slices shown (4 of 4)]
[im 1/35]
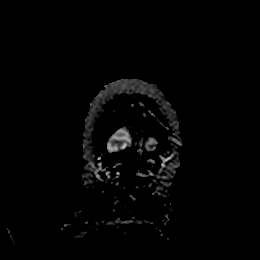
[im 9/35]
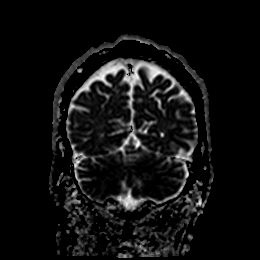
[im 18/35]
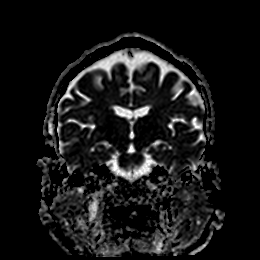
[im 26/35]
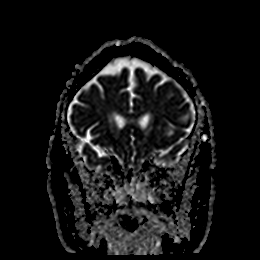
[im 35/35]
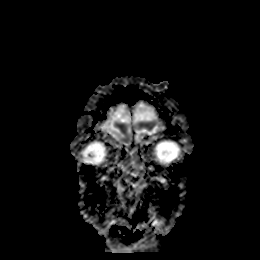

[Series 9: T1 · sagittal · 5.0mm · 0.75mm/px · 3 of 23 slices shown]
[im 1/23]
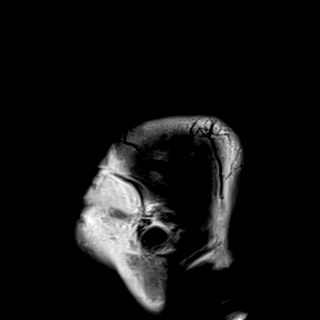
[im 12/23]
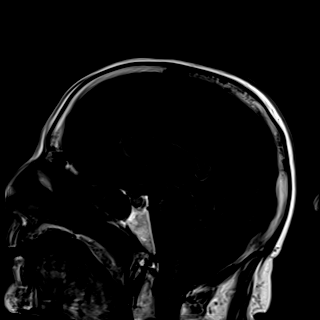
[im 23/23]
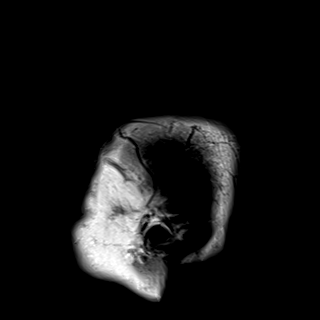

[Series 10: T2 · axial · 5.0mm · 0.72mm/px · z∈[-75,+63]mm · 4 of 25 slices shown]
[im 1/25]
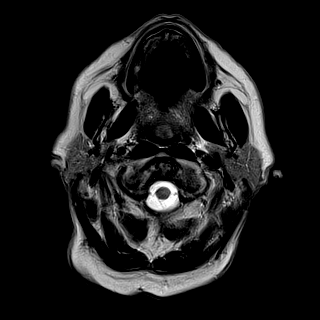
[im 9/25]
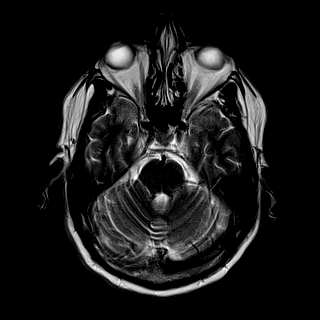
[im 17/25]
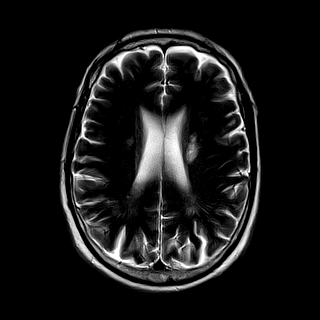
[im 25/25]
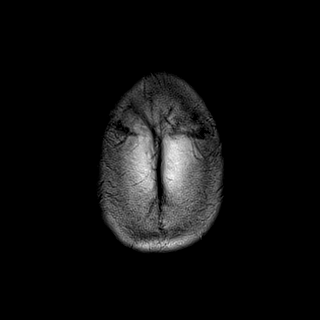

[Series 11: FLAIR · axial · 5.0mm · 0.90mm/px · z∈[-77,+62]mm · 4 of 25 slices shown]
[im 1/25]
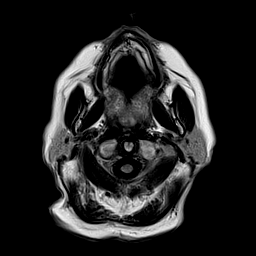
[im 9/25]
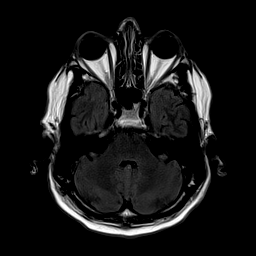
[im 17/25]
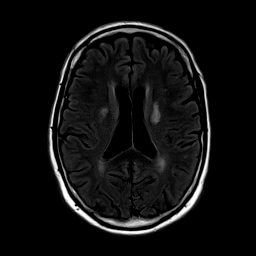
[im 25/25]
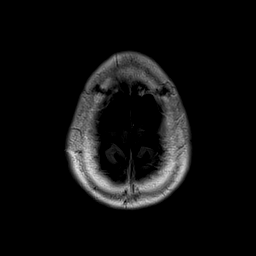

[48 of 48 positions shown; findings below may reference images not displayed]

FINDINGS: The examination had to be discontinued prior to completion by
patient request. No axial T1, axial SWI, or coronal T2 weighted
imaging was obtained.

Brain: Expected evolution of the left corona radiata and left
lentiform restricted diffusion since [DATE]. DWI does confirm a
new 18 millimeter area of restricted diffusion from the right corona
radiata into the right lentiform (series 5, image 78 and series 7,
image 56). T2 and FLAIR hyperintensity at both sites, with no
conventional MRI evidence of hemorrhage, and no associated mass
effect.

No other restricted diffusion. No midline shift, mass effect,
evidence of mass lesion, ventriculomegaly, or extra-axial
collection. Cervicomedullary junction and pituitary are within
normal limits. Gray and white matter signal elsewhere is stable.

Vascular: Major intracranial vascular flow voids are stable.

Skull and upper cervical spine: Negative visible cervical spine and
normal bone marrow signal.

Sinuses/Orbits: Stable, trace fluid in the sphenoid sinus.

Other: Mastoids remain clear. Grossly normal visible internal
auditory structures.
IMPRESSION: 1. Confirmed acute small vessel infarct in the right corona radiata
and right lentiform as suspected by CT. No associated hemorrhage or
mass effect.
2. Expected evolution of the contralateral left corona radiata and
lentiform lacunar infarct since [DATE].
[DATE]. The examination was discontinued prior to completion by patient
request.

## 2019-06-06 MED ORDER — CLOPIDOGREL BISULFATE 75 MG PO TABS: 75.0000 mg | ORAL_TABLET | Freq: Every day | ORAL | Status: DC

## 2019-06-06 MED ORDER — ASPIRIN EC 81 MG PO TBEC: 81.0000 mg | DELAYED_RELEASE_TABLET | Freq: Every day | ORAL | 0 refills | Status: AC

## 2019-06-06 MED ORDER — PREDNISONE 5 MG PO TABS
5.0000 mg | ORAL_TABLET | Freq: Every day | ORAL | Status: DC
  Filled 2019-06-06: qty 1

## 2019-06-06 MED ORDER — MOMETASONE FURO-FORMOTEROL FUM 200-5 MCG/ACT IN AERO
2.0000 | INHALATION_SPRAY | Freq: Two times a day (BID) | RESPIRATORY_TRACT | Status: DC
  Administered 2019-06-06: 2 via RESPIRATORY_TRACT
  Filled 2019-06-06: qty 8.8

## 2019-06-06 MED ORDER — IPRATROPIUM-ALBUTEROL 0.5-2.5 (3) MG/3ML IN SOLN
3.0000 mL | Freq: Four times a day (QID) | RESPIRATORY_TRACT | Status: DC
  Filled 2019-06-06: qty 3

## 2019-06-06 MED ORDER — CLOPIDOGREL BISULFATE 75 MG PO TABS: 75.0000 mg | ORAL_TABLET | Freq: Every day | ORAL | 0 refills | Status: AC

## 2019-06-06 MED ORDER — ACETAMINOPHEN 160 MG/5ML PO SOLN: 650.0000 mg | ORAL | Status: DC | PRN

## 2019-06-06 MED ORDER — CLOPIDOGREL BISULFATE 300 MG PO TABS
300.0000 mg | ORAL_TABLET | Freq: Once | ORAL | Status: AC
  Administered 2019-06-06: 300 mg via ORAL
  Filled 2019-06-06: qty 1

## 2019-06-06 MED ORDER — VITAMIN D 25 MCG (1000 UNIT) PO TABS: 500.0000 [IU] | ORAL_TABLET | Freq: Every day | ORAL | Status: DC

## 2019-06-06 MED ORDER — ACETAMINOPHEN 650 MG RE SUPP: 650.0000 mg | RECTAL | Status: DC | PRN

## 2019-06-06 MED ORDER — VITAMIN D3 25 MCG PO TABS: 1000.0000 [IU] | ORAL_TABLET | Freq: Every day | ORAL | 0 refills | Status: AC

## 2019-06-06 MED ORDER — STROKE: EARLY STAGES OF RECOVERY BOOK
Freq: Once | Status: DC
  Filled 2019-06-06: qty 1

## 2019-06-06 MED ORDER — OMEPRAZOLE 20 MG PO CPDR: 20.0000 mg | DELAYED_RELEASE_CAPSULE | Freq: Every day | ORAL | 0 refills | Status: AC

## 2019-06-06 MED ORDER — ENOXAPARIN SODIUM 40 MG/0.4ML ~~LOC~~ SOLN
40.0000 mg | SUBCUTANEOUS | Status: DC
  Filled 2019-06-06: qty 0.4

## 2019-06-06 MED ORDER — ASPIRIN 300 MG RE SUPP: 300.0000 mg | Freq: Every day | RECTAL | Status: DC

## 2019-06-06 MED ORDER — PREDNISONE 10 MG PO TABS
10.0000 mg | ORAL_TABLET | Freq: Every day | ORAL | Status: DC
  Filled 2019-06-06: qty 1

## 2019-06-06 MED ORDER — ACETAMINOPHEN 325 MG PO TABS: 650.0000 mg | ORAL_TABLET | ORAL | Status: DC | PRN

## 2019-06-06 MED ORDER — ASPIRIN 325 MG PO TABS
325.0000 mg | ORAL_TABLET | Freq: Every day | ORAL | Status: DC
  Administered 2019-06-06: 325 mg via ORAL
  Filled 2019-06-06: qty 1

## 2019-06-06 MED ORDER — ALBUTEROL SULFATE (2.5 MG/3ML) 0.083% IN NEBU: 3.0000 mL | INHALATION_SOLUTION | RESPIRATORY_TRACT | Status: DC | PRN

## 2019-06-06 NOTE — ED Provider Notes (Signed)
I assumed care of this patient from Dr. Regenia Skeeter.  Please see their note for further details of Hx, PE.  Briefly patient is a 63 y.o. male who presented with disorientation with CT concerning for new stroke in right internal capsule compared with Aug MRI.   Current plan is to follow up MRI to confirm.  MRI confirmed acute stroke. Will admit. Neuro consult.    Fatima Blank, MD 06/06/19 [phone]

## 2019-06-06 NOTE — ED Notes (Signed)
Pt trying to get out of bed. Pt educated about being safe and waiting til assistance was in the room to get up. Pt slightly unsteady on feet and advised by RN to stay in bed. Pt noncompliant. Pt says his leg is cramping and he's going to keep standing until its gone. Pt says he isn't going to fall, pt using harsh language and loud tone with RN. RN attempted to provide non-skid socks. Pt refused and stated, "I don't need those fucking socks."

## 2019-06-06 NOTE — ED Notes (Signed)
Family at bedside. 

## 2019-06-06 NOTE — H&P (Signed)
History and Physical    Drew Nguyen GNF:[id number] DOB: December 11, 1955 DOA: 06/05/2019  PCP: Drew Medici, FNP  Patient coming from: Home  I have personally briefly reviewed patient's old medical records in [place]  Chief Complaint: AMS  HPI: Drew Nguyen is a 63 y.o. male with medical history significant of COPD, RA, HTN, cocaine abuse which was still ongoing as of Aug.  Patient had L corona radiata small vessel stroke in Aug.  Patient also has a vocal cord lesion suspicious for malignancy that seems to be being worked up by ENT (care everywhere).  Patient presents to the ED with confusion / disorientation.  Feeling somewhat disoriented since stroke in Aug but seemed to get significantly worse yesterday.  Has also had continuous headache that waxes and wanes since stroke.  He has a hard time describing what he means by confused/disoriented but it seems like he cannot always answer questions appropriately.  He feels like he is stuttering.  He has diffuse weakness but does not feel like there is any new weakness except that the weakness is getting worse.  No double vision.  No fever, chest pain, shortness of breath.  He has a chronic cough.   ED Course: MRI w and w/o contrast reveals new acute ischemic stroke in the R corona radiata this time.  Also shows expected evolution of the L corona radiata stroke from Aug.   Review of Systems: As per HPI, otherwise all review of systems negative.  Past Medical History:  Diagnosis Date  . COPD (chronic obstructive pulmonary disease) ([place])   . Current smoker   . Depression   . Hypertension   . RA (rheumatoid arthritis) ([name])   . Tobacco abuse     Past Surgical History:  Procedure Laterality Date  . ANKLE SURGERY    . MULTIPLE TOOTH EXTRACTIONS       reports that he has been smoking cigarettes. He has a 72.00 pack-year smoking history. He has never used smokeless tobacco. He reports current alcohol use. He reports current drug  use. Drugs: Cocaine and Benzodiazepines.  Allergies  Allergen Reactions  . Celexa [Citalopram] Other (See Comments)    Over sedation Sleeps too much    Family History  Problem Relation Age of Onset  . Hypertension Mother   . Heart attack Mother   . Heart attack Father   . Brain cancer Brother   . Colon cancer Maternal Aunt   . Stomach cancer Neg Hx   . Esophageal cancer Neg Hx   . Pancreatic cancer Neg Hx      Prior to Admission medications   Medication Sig Start Date End Date Taking? Authorizing Provider  acetaminophen (TYLENOL) 500 MG tablet Take 500 mg by mouth every 6 (six) hours as needed for mild pain, fever or headache.     [provider]  amLODipine (NORVASC) 5 MG tablet Take 1 tablet (5 mg total) by mouth daily. 04/20/19 04/19/20  Edwin Dada, MD  aspirin EC 81 MG tablet Take 1 tablet (81 mg total) by mouth daily. 04/20/19 04/19/20  Danford, Suann Larry, MD  COMBIVENT RESPIMAT 20-100 MCG/ACT AERS respimat Inhale 1 puff into the lungs 4 (four) times daily. 03/23/19   [provider]  folic acid (FOLVITE) 1 MG tablet Take 1 mg by mouth daily.    [provider]  hydrochlorothiazide (MICROZIDE) 12.5 MG capsule Take 1 capsule (12.5 mg total) by mouth daily. 04/20/19 04/19/20  Edwin Dada, MD  nicotine (NICODERM CQ - DOSED IN MG/24 HOURS) 14 mg/24hr patch Place 1 patch (14 mg total) onto the skin daily. 04/21/19   Danford, Suann Larry, MD  omeprazole (PRILOSEC) 20 MG capsule Take 1 capsule (20 mg total) by mouth 2 (two) times daily for 10 days. 02/03/19 04/19/19  Doran Stabler, MD  predniSONE (DELTASONE) 10 MG tablet Take 5-10 mg by mouth See admin instructions. 10mg  in the morning and 5mg  in the evening 08/23/18   [provider]  PROAIR HFA 108 (90 Base) MCG/ACT inhaler Inhale 1-2 puffs into the lungs every 4 (four) hours as needed for wheezing or shortness of breath.  07/15/18   [provider]  SYMBICORT 160-4.5  MCG/ACT inhaler Inhale 2 puffs into the lungs 2 (two) times daily. 03/23/19   [provider]  tamsulosin (FLOMAX) 0.4 MG CAPS capsule Take 0.4 mg by mouth daily.  07/11/18   [provider]    Physical Exam: Vitals:   06/05/19 2146 06/05/19 2148  BP: (!) 183/99   Pulse: 81   Resp: 17   Temp: (!) 97.5 F (36.4 C)   SpO2: 99%   Weight:  63.5 kg  Height:  5\' 6"  (1.676 m)    Constitutional: NAD, calm, comfortable Eyes: PERRL, lids and conjunctivae normal ENMT: Mucous membranes are moist. Posterior pharynx clear of any exudate or lesions.Normal dentition.  Neck: normal, supple, no masses, no thyromegaly Respiratory: clear to auscultation bilaterally, no wheezing, no crackles. Normal respiratory effort. No accessory muscle use.  Cardiovascular: Regular rate and rhythm, no murmurs / rubs / gallops. No extremity edema. 2+ pedal pulses. No carotid bruits.  Abdomen: no tenderness, no masses palpated. No hepatosplenomegaly. Bowel sounds positive.  Musculoskeletal: no clubbing / cyanosis. No joint deformity upper and lower extremities. Good ROM, no contractures. Normal muscle tone.  Skin: no rashes, lesions, ulcers. No induration Neurologic: Occasionally stuttering speech, LLE 4/5, 5/5 elsewhere. Psychiatric: Normal judgment and insight. Alert and oriented x 3. Normal mood.    Labs on Admission: I have personally reviewed following labs and imaging studies  CBC: Recent Labs  Lab 06/05/19 2200  WBC 9.0  NEUTROABS 6.6  HGB 14.2  HCT 39.2  MCV 93.8  PLT 751   Basic Metabolic Panel: Recent Labs  Lab 06/05/19 2200  NA 133*  K 3.5  CL 95*  CO2 26  GLUCOSE 92  BUN 9  CREATININE 0.77  CALCIUM 9.5   GFR: Estimated Creatinine Clearance: 84.9 mL/min (by C-G formula based on SCr of 0.77 mg/dL). Liver Function Tests: Recent Labs  Lab 06/05/19 2200  AST 25  ALT 32  ALKPHOS 65  BILITOT 0.6  PROT 7.2  ALBUMIN 4.1   No results for input(s): LIPASE, AMYLASE in  the last 168 hours. No results for input(s): AMMONIA in the last 168 hours. Coagulation Profile: Recent Labs  Lab 06/05/19 2200  INR 0.9   Cardiac Enzymes: No results for input(s): CKTOTAL, CKMB, CKMBINDEX, TROPONINI in the last 168 hours. BNP (last 3 results) No results for input(s): PROBNP in the last 8760 hours. HbA1C: No results for input(s): HGBA1C in the last 72 hours. CBG: Recent Labs  Lab 06/05/19 2146  GLUCAP 98   Lipid Profile: No results for input(s): CHOL, HDL, LDLCALC, TRIG, CHOLHDL, LDLDIRECT in the last 72 hours. Thyroid Function Tests: No results for input(s): TSH, T4TOTAL, FREET4, T3FREE, THYROIDAB in the last 72 hours. Anemia Panel: No results for input(s): VITAMINB12, FOLATE, FERRITIN, TIBC, IRON, RETICCTPCT in the last  72 hours. Urine analysis:    Component Value Date/Time   COLORURINE COLORLESS (A) 06/05/2019 2200   APPEARANCEUR CLEAR 06/05/2019 2200   LABSPEC 1.001 (L) 06/05/2019 2200   PHURINE 7.0 06/05/2019 2200   GLUCOSEU NEGATIVE 06/05/2019 2200   HGBUR NEGATIVE 06/05/2019 2200   BILIRUBINUR NEGATIVE 06/05/2019 2200   KETONESUR NEGATIVE 06/05/2019 2200   PROTEINUR NEGATIVE 06/05/2019 2200   UROBILINOGEN 2.0 (H) 05/03/2010 2059   NITRITE NEGATIVE 06/05/2019 2200   LEUKOCYTESUR NEGATIVE 06/05/2019 2200    Radiological Exams on Admission: Ct Head Wo Contrast  Result Date: 06/05/2019 CLINICAL DATA:  Altered level of consciousness (LOC) unexplained. Additional history provided: Patient acting more confused over the past week, abnormal behavior is, walking encircles, increasing altered mental status. History of recent stroke with left-sided deficit EXAM: CT HEAD WITHOUT CONTRAST TECHNIQUE: Contiguous axial images were obtained from the base of the skull through the vertex without intravenous contrast. COMPARISON:  Brain MRI 04/19/2019, head CT and CT angiography head/neck 04/19/2019 FINDINGS: Brain: There is no acute intracranial hemorrhage or  demarcated cortical infarction. No evidence of intracranial mass. No midline shift or extra-axial fluid collection. New from prior MRI 04/19/2019, there is an infarct within the right corona radiata/internal capsule possibly extending inferiorly to involve the right basal ganglia (series 3, image 23). Redemonstrated now chronic infarct within the left corona radiata/internal capsule and basal ganglia. There is otherwise stable chronic small vessel ischemic disease. Cerebral volume is normal for age. Vascular: No hyperdense vessel. Atherosclerotic calcification of the carotid artery siphons. Skull: No calvarial fracture Sinuses/Orbits: Visualized orbits demonstrate no acute abnormality. No significant paranasal sinus disease or mastoid effusion. These results were called by telephone at the time of interpretation on 06/05/2019 at 11:19 pm to provider Sherwood Gambler , who verbally acknowledged these results. IMPRESSION: 1. An infarct centered within the right corona radiata/internal capsule is new as compared to MRI 04/19/2019, but otherwise age-indeterminate. Consider MRI for further evaluation. 2. Redemonstrated now chronic infarct within the left corona radiata/internal capsule and basal ganglia. 3. Otherwise stable chronic small vessel ischemic disease. Electronically Signed   By: Kellie Simmering   On: 06/05/2019 23:22   Mr Brain Wo Contrast  Result Date: 06/06/2019 CLINICAL DATA:  63 year old male with unexplained altered mental status. Head CT earlier tonight suggesting new right corona radiata small vessel infarct since 04/19/2019. EXAM: MRI HEAD WITHOUT CONTRAST TECHNIQUE: Multiplanar, multiecho pulse sequences of the brain and surrounding structures were obtained without intravenous contrast. COMPARISON:  Head CT 06/05/2019. Brain MRI 04/19/2019. FINDINGS: The examination had to be discontinued prior to completion by patient request. No axial T1, axial SWI, or coronal T2 weighted imaging was obtained.  Brain: Expected evolution of the left corona radiata and left lentiform restricted diffusion since 04/19/2019. DWI does confirm a new 18 millimeter area of restricted diffusion from the right corona radiata into the right lentiform (series 5, image 78 and series 7, image 56). T2 and FLAIR hyperintensity at both sites, with no conventional MRI evidence of hemorrhage, and no associated mass effect. No other restricted diffusion. No midline shift, mass effect, evidence of mass lesion, ventriculomegaly, or extra-axial collection. Cervicomedullary junction and pituitary are within normal limits. Pearline Cables and white matter signal elsewhere is stable. Vascular: Major intracranial vascular flow voids are stable. Skull and upper cervical spine: Negative visible cervical spine and normal bone marrow signal. Sinuses/Orbits: Stable, trace fluid in the sphenoid sinus. Other: Mastoids remain clear. Grossly normal visible internal auditory structures. IMPRESSION: 1. Confirmed acute small vessel  infarct in the right corona radiata and right lentiform as suspected by CT. No associated hemorrhage or mass effect. 2. Expected evolution of the contralateral left corona radiata and lentiform lacunar infarct since 04/19/2019. 3. The examination was discontinued prior to completion by patient request. Electronically Signed   By: Genevie Ann M.D.   On: 06/06/2019 01:40    EKG: Independently reviewed.  Assessment/Plan Principal Problem:   Acute ischemic stroke [facility]) Active Problems:   COPD GOLD I, still smoking   Rheumatoid arthritis ([place])   Essential hypertension   Cocaine abuse ([place])    1. Acute ischemic stroke - 1. Stroke pathway and work up 2. Neuro consult 3. ASA 325 for now 4. Tele monitor 5. 2d echo 6. Carotid dopplers 7. PT/OT/SLP 2. Vocal Cord lesion - 1. Being worked up by [facility], looks like they are concerned for malignancy. 2. Scope with biopsy scheduled for Oct 27th it looks like. 3. COPD - 1. Cont home nebs  4. HTN - 1. Holding home BP meds and allowing permissive HTN 5. RA - 1. Continue prednisone 6. Cocaine abuse - 1. UDS negative today  DVT prophylaxis: Lovenox Code Status: Full Family Communication: No family in room Disposition Plan: Home after admit Consults called: Neuro Admission status: Admit to inpatient  Severity of Illness: The appropriate patient status for this patient is INPATIENT. Inpatient status is judged to be reasonable and necessary in order to provide the required intensity of service to ensure the patient's safety. The patient's presenting symptoms, physical exam findings, and initial radiographic and laboratory data in the context of their chronic comorbidities is felt to place them at high risk for further clinical deterioration. Furthermore, it is not anticipated that the patient will be medically stable for discharge from the hospital within 2 midnights of admission. The following factors support the patient status of inpatient.   IP status due to imaging confirmed acute ischemic stroke.   * I certify that at the point of admission it is my clinical judgment that the patient will require inpatient hospital care spanning beyond 2 midnights from the point of admission due to high intensity of service, high risk for further deterioration and high frequency of surveillance required.*    GARDNER, JARED M. DO [facility]  How to contact the [facility] Attending or Consulting provider [place] or covering provider during after hours [place], for this patient?  1. Check the care team in [facility] and look for a) attending/consulting [facility] provider listed and b) the [facility] team listed 2. Log into [email or web address]  Amion Physician Scheduling and messaging for groups and whole hospitals  On call and physician scheduling software for group practices, residents, hospitalists and other medical providers for call, clinic, rotation and shift schedules. OnCall [employer] is a hospital-wide  system for scheduling doctors and paging doctors on call. EasyPlot is for scientific plotting and data analysis.  [email or web address]  and use 's universal password to access. If you do not have the password, please contact the hospital operator.  3. Locate the [facility] provider you are looking for under [facility] and page to a number that you can be directly reached. 4. If you still have difficulty reaching the provider, please page the [facility] (Director on Call) for the Hospitalists listed on amion for assistance.  06/06/2019, 2:27 AM

## 2019-06-06 NOTE — Evaluation (Addendum)
Physical Therapy Evaluation and Discharge Patient Details Name: Drew Nguyen MRN: [id number] DOB: 15-Oct-1955 Today's Date: 06/06/2019   History of Present Illness  Drew Nguyen is a 63 y.o. male with medical history significant of COPD, RA, HTN, cocaine abuse which was still ongoing as of Aug.Patient had L corona radiata small vessel stroke in Aug. Patient also has a vocal cord lesion suspicious for malignancy.Patient presents to the ED with confusion / disorientation. MRI: new acute ischemic stroke in the R corona radiata  Clinical Impression  Pt admitted with above. Pt reports residual deficits of confusion, forgetfulness, and fatigue since prior stroke in August. On PT evaluation, pt ambulating hallway distances without physical difficulty. Strength and coordination normal upon assessment. Pt reports he is at baseline in regards to mobility. Did note decreased short term memory. Prior to admission, pt is retired and takes care of his three children aged 63-12. Education provided on activity modifications and allowing for daily rest periods. No further acute or follow up PT needs.     Follow Up Recommendations No PT follow up    Equipment Recommendations  None recommended by PT    Recommendations for Other Services Speech consult     Precautions / Restrictions Precautions Precautions: None Restrictions Weight Bearing Restrictions: No      Mobility  Bed Mobility Overal bed mobility: Independent                Transfers Overall transfer level: Modified independent Equipment used: None             General transfer comment: Slower to rise, no assist needed  Ambulation/Gait Ambulation/Gait assistance: Supervision Gait Distance (Feet): 200 Feet Assistive device: None Gait Pattern/deviations: Step-through pattern;Decreased stride length Gait velocity: decreased   General Gait Details: Pt with slow and steady pace, decreased reciprocal arm swing, forward head  posture.  Stairs            Wheelchair Mobility    Modified Rankin (Stroke Patients Only) Modified Rankin (Stroke Patients Only) Pre-Morbid Rankin Score: No significant disability Modified Rankin: Moderately severe disability     Balance Overall balance assessment: Mild deficits observed, not formally tested                                           Pertinent Vitals/Pain Pain Assessment: No/denies pain    Home Living Family/patient expects to be discharged to:: Private residence Living Arrangements: Children Available Help at Discharge: Family;Available 24 hours/day Type of Home: House Home Access: Level entry     Home Layout: One level Home Equipment: Walker - 2 wheels;Wheelchair - manual;Bedside commode Additional Comments: 3 children, ages 68-12    Prior Function Level of Independence: Independent         Comments: Driving, retired, takes care of all 3 children     Hand Dominance   Dominant Hand: Right    Extremity/Trunk Assessment   Upper Extremity Assessment Upper Extremity Assessment: Defer to OT evaluation    Lower Extremity Assessment Lower Extremity Assessment: RLE deficits/detail;LLE deficits/detail RLE Deficits / Details: Strength 5/5 RLE Coordination: WNL LLE Deficits / Details: Strength 5/5 LLE Coordination: WNL    Cervical / Trunk Assessment Cervical / Trunk Assessment: Kyphotic;Other exceptions Cervical / Trunk Exceptions: forward head posture  Communication   Communication: Expressive difficulties(decreased speech cadence with intermittent pauses)  Cognition Arousal/Alertness: Awake/alert Behavior During Therapy: WFL for tasks  assessed/performed Overall Cognitive Status: Impaired/Different from baseline Area of Impairment: Memory                     Memory: Decreased short-term memory         General Comments: Pt A&Ox4, not oriented to day of week, stating it was Thursday. 1/3 short term memory  recall, 2/3 with cueing. Pt reports since prior stroke in August, increased fatigue in afternoons, feeling "ill," when he wakes up from naps, increased confusion and forgetfulness       General Comments      Exercises     Assessment/Plan    PT Assessment Patent does not need any further PT services  PT Problem List Decreased activity tolerance;Decreased balance;Decreased cognition       PT Treatment Interventions      PT Goals (Current goals can be found in the Care Plan section)  Acute Rehab PT Goals Patient Stated Goal: "take care of my children and not feel so tired." PT Goal Formulation: All assessment and education complete, DC therapy    Frequency     Barriers to discharge        Co-evaluation PT/OT/SLP Co-Evaluation/Treatment: Yes Reason for Co-Treatment: To address functional/ADL transfers PT goals addressed during session: Mobility/safety with mobility         AM-PAC PT "6 Clicks" Mobility  Outcome Measure Help needed turning from your back to your side while in a flat bed without using bedrails?: None Help needed moving from lying on your back to sitting on the side of a flat bed without using bedrails?: None Help needed moving to and from a bed to a chair (including a wheelchair)?: None Help needed standing up from a chair using your arms (e.g., wheelchair or bedside chair)?: None Help needed to walk in hospital room?: None Help needed climbing 3-5 steps with a railing? : A Little 6 Click Score: 23    End of Session Equipment Utilized During Treatment: Gait belt Activity Tolerance: Patient tolerated treatment well Patient left: in bed;with call bell/phone within reach Nurse Communication: Mobility status PT Visit Diagnosis: Other symptoms and signs involving the nervous system (R29.898)    Time: 8867-7373 PT Time Calculation (min) (ACUTE ONLY): 21 min   Charges:   PT Evaluation $PT Eval Moderate Complexity: 1 Mod          Ellamae Sia,  PT, DPT Acute Rehabilitation Services Pager [phone] Office [phone]   Willy Eddy 06/06/2019, 11:32 AM

## 2019-06-06 NOTE — ED Notes (Signed)
Dr. Pietro Cassis advised he will be placing discharge orders for patient.

## 2019-06-06 NOTE — Progress Notes (Signed)
STROKE TEAM PROGRESS NOTE   INTERVAL HISTORY Patient presented with transient confusion yesterday which appears to have resolved.  MRI scan of the brain shows a small right corona radiata and lentiform nucleus lacunar infarct likely from small vessel disease.  He recently had a left brain lacunar infarct in August 2020 from which she has recovered.  Urine drug screen during that admission was positive for cocaine but is clean during this admission.  Vitals:   06/06/19 1400 06/06/19 1415 06/06/19 1430 06/06/19 1445  BP: (!) 148/71 (!) 144/74 (!) 143/87 (!) 133/95  Pulse:      Resp: 15 13 17 18   Temp:      SpO2:      Weight:      Height:        CBC:  Recent Labs  Lab 06/05/19 2200  WBC 9.0  NEUTROABS 6.6  HGB 14.2  HCT 39.2  MCV 93.8  PLT 174    Basic Metabolic Panel:  Recent Labs  Lab 06/05/19 2200  NA 133*  K 3.5  CL 95*  CO2 26  GLUCOSE 92  BUN 9  CREATININE 0.77  CALCIUM 9.5   Lipid Panel:     Component Value Date/Time   CHOL 155 06/06/2019 0327   TRIG 43 06/06/2019 0327   HDL 93 06/06/2019 0327   CHOLHDL 1.7 06/06/2019 0327   VLDL 9 06/06/2019 0327   LDLCALC 53 06/06/2019 0327   HgbA1c:  Lab Results  Component Value Date   HGBA1C 5.4 06/06/2019   Urine Drug Screen:     Component Value Date/Time   LABOPIA NONE DETECTED 06/05/2019 2200   COCAINSCRNUR NONE DETECTED 06/05/2019 2200   LABBENZ NONE DETECTED 06/05/2019 2200   AMPHETMU NONE DETECTED 06/05/2019 2200   THCU NONE DETECTED 06/05/2019 2200   LABBARB NONE DETECTED 06/05/2019 2200    Alcohol Level     Component Value Date/Time   ETH <10 06/05/2019 2200    IMAGING Dg Chest 2 View  Result Date: 06/06/2019 CLINICAL DATA:  Acute ischemic stroke EXAM: CHEST - 2 VIEW COMPARISON:  Radiograph 04/19/2019, CT 03/04/2019 FINDINGS: Chronic interstitial and bronchitic changes with hyperinflation of the lungs and flattening of the diaphragms is similar to comparison. No acute consolidative opacity.  No pneumothorax or effusion. No features of edema. Cardiomediastinal contours are stable from prior. No acute osseous or soft tissue abnormality. Multilevel degenerative changes are present in the imaged portions of the spine. 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 Mild degenerative changes noted in the acromioclavicular joints. IMPRESSION: Chronic interstitial and bronchitic changes, similar to comparison. No acute cardiopulmonary disease. Electronically Signed   By: Lovena Le M.D.   On: 06/06/2019 03:04   Ct Head Wo Contrast  Result Date: 06/05/2019 CLINICAL DATA:  Altered level of consciousness (LOC) unexplained. Additional history provided: Patient acting more confused over the past week, abnormal behavior is, walking encircles, increasing altered mental status. History of recent stroke with left-sided deficit EXAM: CT HEAD WITHOUT CONTRAST TECHNIQUE: Contiguous axial images were obtained from the base of the skull through the vertex without intravenous contrast. COMPARISON:  Brain MRI 04/19/2019, head CT and CT angiography head/neck 04/19/2019 FINDINGS: Brain: There is no acute intracranial hemorrhage or demarcated cortical infarction. No evidence of intracranial mass. No midline shift or extra-axial fluid collection. New from prior MRI 04/19/2019, there is an infarct within the right corona radiata/internal capsule possibly extending inferiorly to involve the right basal ganglia (series 3, image 23).  Redemonstrated now chronic infarct within the left corona radiata/internal capsule and basal ganglia. There is otherwise stable chronic small vessel ischemic disease. Cerebral volume is normal for age. Vascular: No hyperdense vessel. Atherosclerotic calcification of the carotid artery siphons. Skull: No calvarial fracture Sinuses/Orbits: Visualized orbits demonstrate no acute abnormality. No significant paranasal sinus disease or  mastoid effusion. These results were called by telephone at the time of interpretation on 06/05/2019 at 11:19 pm to provider Sherwood Gambler , who verbally acknowledged these results. IMPRESSION: 1. An infarct centered within the right corona radiata/internal capsule is new as compared to MRI 04/19/2019, but otherwise age-indeterminate. Consider MRI for further evaluation. 2. Redemonstrated now chronic infarct within the left corona radiata/internal capsule and basal ganglia. 3. Otherwise stable chronic small vessel ischemic disease. Electronically Signed   By: Kellie Simmering   On: 06/05/2019 23:22   Mr Brain Wo Contrast  Result Date: 06/06/2019 CLINICAL DATA:  63 year old male with unexplained altered mental status. Head CT earlier tonight suggesting new right corona radiata small vessel infarct since 04/19/2019. EXAM: MRI HEAD WITHOUT CONTRAST TECHNIQUE: Multiplanar, multiecho pulse sequences of the brain and surrounding structures were obtained without intravenous contrast. COMPARISON:  Head CT 06/05/2019. Brain MRI 04/19/2019. FINDINGS: The examination had to be discontinued prior to completion by patient request. No axial T1, axial SWI, or coronal T2 weighted imaging was obtained. Brain: Expected evolution of the left corona radiata and left lentiform restricted diffusion since 04/19/2019. DWI does confirm a new 18 millimeter area of restricted diffusion from the right corona radiata into the right lentiform (series 5, image 78 and series 7, image 56). T2 and FLAIR hyperintensity at both sites, with no conventional MRI evidence of hemorrhage, and no associated mass effect. No other restricted diffusion. No midline shift, mass effect, evidence of mass lesion, ventriculomegaly, or extra-axial collection. Cervicomedullary junction and pituitary are within normal limits. Pearline Cables and white matter signal elsewhere is stable. Vascular: Major intracranial vascular flow voids are stable. Skull and upper cervical spine:  Negative visible cervical spine and normal bone marrow signal. Sinuses/Orbits: Stable, trace fluid in the sphenoid sinus. Other: Mastoids remain clear. Grossly normal visible internal auditory structures. IMPRESSION: 1. Confirmed acute small vessel infarct in the right corona radiata and right lentiform as suspected by CT. No associated hemorrhage or mass effect. 2. Expected evolution of the contralateral left corona radiata and lentiform lacunar infarct since 04/19/2019. 3. The examination was discontinued prior to completion by patient request. Electronically Signed   By: Genevie Ann M.D.   On: 06/06/2019 01:40   Vas US Carotid  Result Date: 06/06/2019 Carotid Arterial Duplex Study Indications:   Acute small vessel infarct in the right corona radiata                and right lentiform. Risk Factors:  Hypertension, current smoker. Other Factors: CTA neck 04/19/19 No ICA stenosis. Performing Technologist: June Leap RDMS, RVT  Examination Guidelines: A complete evaluation includes B-mode imaging, spectral Doppler, color Doppler, and power Doppler as needed of all accessible portions of each vessel. Bilateral testing is considered an integral part of a complete examination. Limited examinations for reoccurring indications may be performed as noted.  Right Carotid Findings: +----------+--------+--------+--------+------------------+--------+           PSV cm/sEDV cm/sStenosisPlaque DescriptionComments +----------+--------+--------+--------+------------------+--------+ CCA Prox  88      15                                         +----------+--------+--------+--------+------------------+--------+  CCA Distal88      22                                         +----------+--------+--------+--------+------------------+--------+ ICA Prox  42      14      1-39%   heterogenous               +----------+--------+--------+--------+------------------+--------+ ICA Distal81      17                                          +----------+--------+--------+--------+------------------+--------+ ECA       80      15              heterogenous               +----------+--------+--------+--------+------------------+--------+ +----------+--------+-------+----------------+-------------------+           PSV cm/sEDV cmsDescribe        Arm Pressure (mmHG) +----------+--------+-------+----------------+-------------------+ [id number]            Multiphasic, WNL                    +----------+--------+-------+----------------+-------------------+ +---------+--------+--+--------+--+---------+ VertebralPSV cm/s59EDV cm/s20Antegrade +---------+--------+--+--------+--+---------+  Left Carotid Findings: +----------+--------+--------+--------+------------------+--------+           PSV cm/sEDV cm/sStenosisPlaque DescriptionComments +----------+--------+--------+--------+------------------+--------+ CCA Prox  99      15                                         +----------+--------+--------+--------+------------------+--------+ CCA Distal94      25                                         +----------+--------+--------+--------+------------------+--------+ ICA Prox  87      23      1-39%   heterogenous               +----------+--------+--------+--------+------------------+--------+ ICA Distal113     31                                         +----------+--------+--------+--------+------------------+--------+ ECA       143     17              heterogenous               +----------+--------+--------+--------+------------------+--------+ +----------+--------+--------+----------------+-------------------+           PSV cm/sEDV cm/sDescribe        Arm Pressure (mmHG) +----------+--------+--------+----------------+-------------------+ Subclavian200             Multiphasic, WNL                    +----------+--------+--------+----------------+-------------------+  +---------+--------+--+--------+--+---------+ VertebralPSV cm/s89EDV cm/s18Antegrade +---------+--------+--+--------+--+---------+  Summary: Right Carotid: Velocities in the right ICA are consistent with a 1-39% stenosis. Left Carotid: Velocities in the left ICA are consistent with a 1-39% stenosis. Vertebrals:  Bilateral vertebral arteries demonstrate antegrade flow. Subclavians: Normal flow hemodynamics were seen in bilateral subclavian  arteries. *See table(s) above for measurements and observations.  Electronically signed by Antony Contras MD on 06/06/2019 at 2:09:31 PM.    Final     PHYSICAL EXAM Pleasant frail middle-aged Caucasian male not in distress. . Afebrile. Head is nontraumatic. Neck is supple without bruit.    Cardiac exam no murmur or gallop. Lungs are clear to auscultation. Distal pulses are well felt. Neurological Exam ;  Awake  Alert oriented x 3. Normal speech and language.eye movements full without nystagmus.fundi were not visualized. Vision acuity and fields appear normal. Hearing is normal. Palatal movements are normal.  Mild right lower facial weakness.. Tongue midline. Normal strength, tone, reflexes and coordination.  Except mild weakness of right grip and intrinsic hand muscles and right hip flexors..  Orbits left over right upper extremity.  Normal sensation. Gait deferred.  ASSESSMENT/PLAN Drew Nguyen is a 63 y.o. 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 MRI showed an acute R subcortical infarct.    Stroke:  Incidental Tiny R subcortical lacunar infarct secondary to small vessel disease .recent left subcortical lacunar infarct in August 2020 with mild residual right-sided weakness.  CT head R corona radiata/internal capsule infarct new since 04/19/2019. 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  Stable small vessel disease.   MRI  R corona radiata and R lentiform nucleas infarct. Expected evolution L corona radiata and lentiform nucleas infarct since 04/19/2019.  Carotid Doppler  B ICA 1-39% stenosis, VAs antegrade   LDL 53  HgbA1c 5.4  Lovenox 40 mg sq daily for VTE prophylaxis  aspirin 81 mg daily prior to admission, now on aspirin 325 mg daily and clopidogrel 75 mg daily. Given mild stroke, recommend aspirin 81 mg and plavix 75 mg daily x 3 weeks, then PLAVIX alone.   Therapy recommendations:  No therapy needs  Disposition:  Return home  Hypertension  Stable . Permissive hypertension (OK if < 220/120) but gradually normalize in 5-7 days . Long-term BP goal normotensive  Hyperlipidemia  Home meds:  crestor 5  LDL 53, goal < 70  Continue statin at discharge  Other Stroke Risk Factors  Cigarette smoker, advised to stop smoking  ETOH use, alcohol level <10, advised to drink no more than 2 drink(s) a day  Hx polysubstance abuse cocaine and benzos. UDS:  THC NONE DETECTED, Cocaine NONE DETECTED, Benzodiazepines NONE DETECTED. Patient advised to stop using due to stroke risk.  Other Active Problems  Vocal cord lesion being worked up at [facility], concern for malignancy, scope planned 10/27  COPD on nebs  TA on prednisone  Hospital day # 1  I have personally obtained history,examined this patient, reviewed notes, independently viewed imaging studies, participated in medical decision making and plan of care.ROS completed by me personally and pertinent positives fully documented  I have made any additions or clarifications directly to the above note.he presented with transient confusion likely related to small right subcortical lacunar infarct.  Recommend dual antiplatelet therapy for 3 weeks followed by Plavix alone.  Patient was counseled to quit smoking and seems agreeable.  Aggressive risk factor modification.  Greater than 50% time during this 25-minute visit was  spent on counseling and coordination of care about his lacunar stroke and answering questions.  Discussed with Dr. Luna Kitchens, MD Medical Director [place] Pager: [phone] 06/06/2019 5:42 PM   To contact Stroke Continuity  provider, please refer to [email or web address] After hours, contact General Neurology

## 2019-06-06 NOTE — ED Notes (Addendum)
Heart healthy lunch tray ordered 

## 2019-06-06 NOTE — Discharge Planning (Signed)
EDSW provided community resources as requested by MD.

## 2019-06-06 NOTE — Progress Notes (Deleted)
Patient had CTA neck which showed no carotid stenosis.  Cancelling carotid order, please reorder if needed.  Vascular lab [phone]

## 2019-06-06 NOTE — Discharge Summary (Signed)
Physician Discharge Summary  Drew Nguyen ZOX:[id number] DOB: 1955/11/14 DOA: 06/05/2019  PCP: Boyce Medici, FNP  Admit date: 06/05/2019 Discharge date: 06/06/2019  Admitted From: Home Discharge disposition: Home   Code Status: Full Code  Diet Recommendation: Cardiac diet   Recommendations for Outpatient Follow-Up:   1. Follow-up with PT as an outpatient. 2. I ordered for referral to Dr. Leonie Man as outpatient for follow-up.  Discharge Diagnosis:   Principal Problem:   Acute ischemic stroke ([name]) Active Problems:   COPD GOLD I, still smoking   Rheumatoid arthritis ([place])   Essential hypertension   Cocaine abuse ([place])   Lesion of true vocal cord   Stroke [facility])    History of Present Illness / Brief narrative:  Drew Nguyen is a 63 y.o. 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  Apparently, since his last stroke in August, he has intermittent headache, generalized weakness, confusion.  His confusion got acutely worse on 10/15 and he presented to the ED. He denies any new numbness, but states that he feels generally weak all over.   In the ER, he had a CT head and MRI brain.   CT head showed an acute infarct in the right corona radiata. MRI brain confirmed the finding.  He was admitted for acute right corona radiata stroke.  Hospital Course:  Acute right corona radiata stroke Recent left corona radiata stroke Chronic multivessel disease -CT scan and MRI findings as above. -Carotid ultrasound did not show any significant stenosis -He had an echocardiogram done on 8/30 during his last stroke, EF 55 to 60%. No cardiac source of embolism. -Hemoglobin A1c 5.4., lipid panel showed LDL 93, LDL 53. -PT/OT evaluation  obtained. -Home meds include aspirin 81 mg, Crestor 5 mg. -d/w neurologist Dr. Leonie Man. Recommended to add Plavix 75 mg daily.  Recommend DAPT for next 3 weeks and after that stop aspirin and only continue Plavix 75 mg daily. -Continue statin.  Hypertension -Blood pressure elevated to 180s on admission.   -Currently running in 150s. -Home meds include amlodipine, HCTZ. -Permissive hypertension for first 24 to 48 hours. -Resume blood pressure medicine at home.  Rheumatoid arthritis -Home meds include Prednisone 5 to 10 mg daily, meloxicam, Protonix, ?  No vitamin D. -I added vitamin D to the regimen. -Recommend to avoid NSAID like meloxicam while on dual antiplatelet.  Polysubstance abuse: Cocaine abuse, tobacco abuse -Counseled to quit. -Patient also expresses having significant stressors at home.  His wife has psych issues and he is mostly responsible for caring for her as well as 3 kids ages 54, 41 and 66 years. -No need of inpatient psych evaluation at this time.  Case manager made aware. Provided resources for outpatient mental health services.  Okay to discharge to home today.  Subjective:  Seen and examined this afternoon in the ED itself.  Patient was waiting for bed availability.  Completed stroke work-up.  Discussed with neurology.  Okay to discharge to home today.  Discharge Exam:   Vitals:   06/06/19 1400 06/06/19 1415 06/06/19 1430 06/06/19 1445  BP: (!) 148/71 (!) 144/74 (!) 143/87 (!) 133/95  Pulse:      Resp: 15 13 17 18   Temp:      SpO2:      Weight:  Height:        Body mass index is 22.6 kg/m.  General exam: Appears calm and comfortable.  Skin: No rashes, lesions or ulcers. HEENT: Atraumatic, normocephalic, supple neck, no obvious bleeding Lungs: Clear to auscultation bilaterally CVS: Regular rate and rhythm, no murmur GI/Abd soft, nontender, nondistended, bowel sound present CNS: Alert, awake amount x3 Psychiatry: Mood appropriate Extremities: No  pedal edema, no calf tenderness  Discharge Instructions:  Wound care: none Discharge Instructions    Diet - low sodium heart healthy   Complete by: As directed    Increase activity slowly   Complete by: As directed       Allergies as of 06/06/2019      Reactions   Celexa [citalopram] Other (See Comments)   Over sedation Sleeps too much      Medication List    STOP taking these medications   fluconazole 40 MG/ML suspension Commonly known as: DIFLUCAN   meloxicam 7.5 MG tablet Commonly known as: MOBIC   nicotine 14 mg/24hr patch Commonly known as: NICODERM CQ - dosed in mg/24 hours     TAKE these medications   acetaminophen 500 MG tablet Commonly known as: TYLENOL Take 500 mg by mouth every 6 (six) hours as needed for mild pain, fever or headache.   amLODipine 5 MG tablet Commonly known as: NORVASC Take 1 tablet (5 mg total) by mouth daily.   aspirin EC 81 MG tablet Take 1 tablet (81 mg total) by mouth daily for 21 days.   clopidogrel 75 MG tablet Commonly known as: PLAVIX Take 1 tablet (75 mg total) by mouth daily. Start taking on: June 07, 2019   Combivent Respimat 20-100 MCG/ACT Aers respimat Generic drug: Ipratropium-Albuterol Inhale 1 puff into the lungs 4 (four) times daily.   hydrochlorothiazide 12.5 MG capsule Commonly known as: Microzide Take 1 capsule (12.5 mg total) by mouth daily.   omeprazole 20 MG capsule Commonly known as: PRILOSEC Take 1 capsule (20 mg total) by mouth daily.   predniSONE 10 MG tablet Commonly known as: DELTASONE Take 5-10 mg by mouth See admin instructions. 10mg  in the morning and 5mg  in the evening   rosuvastatin 5 MG tablet Commonly known as: CRESTOR Take 5 mg by mouth every evening.   Symbicort 160-4.5 MCG/ACT inhaler Generic drug: budesonide-formoterol Inhale 2 puffs into the lungs 2 (two) times daily.   Vitamin D3 25 MCG tablet Commonly known as: Vitamin D Take 1 tablet (1,000 Units total) by mouth  daily. Start taking on: June 07, 2019       Time coordinating discharge: 35 minutes  The results of significant diagnostics from this hospitalization (including imaging, microbiology, ancillary and laboratory) are listed below for reference.    Procedures and Diagnostic Studies:   Dg Chest 2 View  Result Date: 06/06/2019 CLINICAL DATA:  Acute ischemic stroke EXAM: CHEST - 2 VIEW COMPARISON:  Radiograph 04/19/2019, CT 03/04/2019 FINDINGS: Chronic interstitial and bronchitic changes with hyperinflation of the lungs and flattening of the diaphragms is similar to comparison. No acute consolidative opacity. No pneumothorax or effusion. No features of edema. Cardiomediastinal contours are stable from prior. No acute osseous or soft tissue abnormality. Multilevel degenerative changes are present in the imaged portions of the spine. 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 Mild degenerative changes noted in the acromioclavicular joints. IMPRESSION: Chronic interstitial and bronchitic changes, similar to comparison. No acute cardiopulmonary disease. Electronically Signed   By: Elwin Sleight.D.  On: 06/06/2019 03:04   Ct Head Wo Contrast  Result Date: 06/05/2019 CLINICAL DATA:  Altered level of consciousness (LOC) unexplained. Additional history provided: Patient acting more confused over the past week, abnormal behavior is, walking encircles, increasing altered mental status. History of recent stroke with left-sided deficit EXAM: CT HEAD WITHOUT CONTRAST TECHNIQUE: Contiguous axial images were obtained from the base of the skull through the vertex without intravenous contrast. COMPARISON:  Brain MRI 04/19/2019, head CT and CT angiography head/neck 04/19/2019 FINDINGS: Brain: There is no acute intracranial hemorrhage or demarcated cortical infarction. No evidence of intracranial mass. No midline shift or extra-axial fluid  collection. New from prior MRI 04/19/2019, there is an infarct within the right corona radiata/internal capsule possibly extending inferiorly to involve the right basal ganglia (series 3, image 23). Redemonstrated now chronic infarct within the left corona radiata/internal capsule and basal ganglia. There is otherwise stable chronic small vessel ischemic disease. Cerebral volume is normal for age. Vascular: No hyperdense vessel. Atherosclerotic calcification of the carotid artery siphons. Skull: No calvarial fracture Sinuses/Orbits: Visualized orbits demonstrate no acute abnormality. No significant paranasal sinus disease or mastoid effusion. These results were called by telephone at the time of interpretation on 06/05/2019 at 11:19 pm to provider Sherwood Gambler , who verbally acknowledged these results. IMPRESSION: 1. An infarct centered within the right corona radiata/internal capsule is new as compared to MRI 04/19/2019, but otherwise age-indeterminate. Consider MRI for further evaluation. 2. Redemonstrated now chronic infarct within the left corona radiata/internal capsule and basal ganglia. 3. Otherwise stable chronic small vessel ischemic disease. Electronically Signed   By: Kellie Simmering   On: 06/05/2019 23:22   Mr Brain Wo Contrast  Result Date: 06/06/2019 CLINICAL DATA:  63 year old male with unexplained altered mental status. Head CT earlier tonight suggesting new right corona radiata small vessel infarct since 04/19/2019. EXAM: MRI HEAD WITHOUT CONTRAST TECHNIQUE: Multiplanar, multiecho pulse sequences of the brain and surrounding structures were obtained without intravenous contrast. COMPARISON:  Head CT 06/05/2019. Brain MRI 04/19/2019. FINDINGS: The examination had to be discontinued prior to completion by patient request. No axial T1, axial SWI, or coronal T2 weighted imaging was obtained. Brain: Expected evolution of the left corona radiata and left lentiform restricted diffusion since  04/19/2019. DWI does confirm a new 18 millimeter area of restricted diffusion from the right corona radiata into the right lentiform (series 5, image 78 and series 7, image 56). T2 and FLAIR hyperintensity at both sites, with no conventional MRI evidence of hemorrhage, and no associated mass effect. No other restricted diffusion. No midline shift, mass effect, evidence of mass lesion, ventriculomegaly, or extra-axial collection. Cervicomedullary junction and pituitary are within normal limits. Pearline Cables and white matter signal elsewhere is stable. Vascular: Major intracranial vascular flow voids are stable. Skull and upper cervical spine: Negative visible cervical spine and normal bone marrow signal. Sinuses/Orbits: Stable, trace fluid in the sphenoid sinus. Other: Mastoids remain clear. Grossly normal visible internal auditory structures. IMPRESSION: 1. Confirmed acute small vessel infarct in the right corona radiata and right lentiform as suspected by CT. No associated hemorrhage or mass effect. 2. Expected evolution of the contralateral left corona radiata and lentiform lacunar infarct since 04/19/2019. 3. The examination was discontinued prior to completion by patient request. Electronically Signed   By: Genevie Ann M.D.   On: 06/06/2019 01:40   Vas US Carotid  Result Date: 06/06/2019 Carotid Arterial Duplex Study Indications:   Acute small vessel infarct in the right corona radiata  and right lentiform. Risk Factors:  Hypertension, current smoker. Other Factors: CTA neck 04/19/19 No ICA stenosis. Performing Technologist: June Leap RDMS, RVT  Examination Guidelines: A complete evaluation includes B-mode imaging, spectral Doppler, color Doppler, and power Doppler as needed of all accessible portions of each vessel. Bilateral testing is considered an integral part of a complete examination. Limited examinations for reoccurring indications may be performed as noted.  Right Carotid Findings:  +----------+--------+--------+--------+------------------+--------+             PSV cm/s EDV cm/s Stenosis Plaque Description Comments  +----------+--------+--------+--------+------------------+--------+  CCA Prox   88       15                                             +----------+--------+--------+--------+------------------+--------+  CCA Distal 88       22                                             +----------+--------+--------+--------+------------------+--------+  ICA Prox   42       14       1-39%    heterogenous                 +----------+--------+--------+--------+------------------+--------+  ICA Distal 81       17                                             +----------+--------+--------+--------+------------------+--------+  ECA        80       15                heterogenous                 +----------+--------+--------+--------+------------------+--------+ +----------+--------+-------+----------------+-------------------+             PSV cm/s EDV cms Describe         Arm Pressure (mmHG)  +----------+--------+-------+----------------+-------------------+  Subclavian 111              Multiphasic, WNL                      +----------+--------+-------+----------------+-------------------+ +---------+--------+--+--------+--+---------+  Vertebral PSV cm/s 59 EDV cm/s 20 Antegrade  +---------+--------+--+--------+--+---------+  Left Carotid Findings: +----------+--------+--------+--------+------------------+--------+             PSV cm/s EDV cm/s Stenosis Plaque Description Comments  +----------+--------+--------+--------+------------------+--------+  CCA Prox   99       15                                             +----------+--------+--------+--------+------------------+--------+  CCA Distal 94       25                                             +----------+--------+--------+--------+------------------+--------+  ICA Prox   87       23  1-39%    heterogenous                  +----------+--------+--------+--------+------------------+--------+  ICA Distal 113      31                                             +----------+--------+--------+--------+------------------+--------+  ECA        143      17                heterogenous                 +----------+--------+--------+--------+------------------+--------+ +----------+--------+--------+----------------+-------------------+             PSV cm/s EDV cm/s Describe         Arm Pressure (mmHG)  +----------+--------+--------+----------------+-------------------+  Subclavian 200               Multiphasic, WNL                      +----------+--------+--------+----------------+-------------------+ +---------+--------+--+--------+--+---------+  Vertebral PSV cm/s 89 EDV cm/s 18 Antegrade  +---------+--------+--+--------+--+---------+  Summary: Right Carotid: Velocities in the right ICA are consistent with a 1-39% stenosis. Left Carotid: Velocities in the left ICA are consistent with a 1-39% stenosis. Vertebrals:  Bilateral vertebral arteries demonstrate antegrade flow. Subclavians: Normal flow hemodynamics were seen in bilateral subclavian              arteries. *See table(s) above for measurements and observations.  Electronically signed by Antony Contras MD on 06/06/2019 at 2:09:31 PM.    Final      Labs:   Basic Metabolic Panel: Recent Labs  Lab 06/05/19 2200  NA 133*  K 3.5  CL 95*  CO2 26  GLUCOSE 92  BUN 9  CREATININE 0.77  CALCIUM 9.5   GFR Estimated Creatinine Clearance: 84.9 mL/min (by C-G formula based on SCr of 0.77 mg/dL). Liver Function Tests: Recent Labs  Lab 06/05/19 2200  AST 25  ALT 32  ALKPHOS 65  BILITOT 0.6  PROT 7.2  ALBUMIN 4.1   No results for input(s): LIPASE, AMYLASE in the last 168 hours. No results for input(s): AMMONIA in the last 168 hours. Coagulation profile Recent Labs  Lab 06/05/19 2200  INR 0.9    CBC: Recent Labs  Lab 06/05/19 2200  WBC 9.0  NEUTROABS 6.6  HGB 14.2   HCT 39.2  MCV 93.8  PLT 353   Cardiac Enzymes: No results for input(s): CKTOTAL, CKMB, CKMBINDEX, TROPONINI in the last 168 hours. BNP: Invalid input(s): POCBNP CBG: Recent Labs  Lab 06/05/19 2146  GLUCAP 98   D-Dimer No results for input(s): DDIMER in the last 72 hours. Hgb A1c Recent Labs    06/06/19 0327  HGBA1C 5.4   Lipid Profile Recent Labs    06/06/19 0327  CHOL 155  HDL 93  LDLCALC 53  TRIG 43  CHOLHDL 1.7   Thyroid function studies No results for input(s): TSH, T4TOTAL, T3FREE, THYROIDAB in the last 72 hours.  Invalid input(s): FREET3 Anemia work up No results for input(s): VITAMINB12, FOLATE, FERRITIN, TIBC, IRON, RETICCTPCT in the last 72 hours. Microbiology No results found for this or any previous visit (from the past 240 hour(s)).  Please note: You were cared for by a hospitalist during your hospital stay. Once you are discharged, your primary care physician  will handle any further medical issues. Please note that NO REFILLS for any discharge medications will be authorized once you are discharged, as it is imperative that you return to your primary care physician (or establish a relationship with a primary care physician if you do not have one) for your post hospital discharge needs so that they can reassess your need for medications and monitor your lab values.  Signed: Terrilee Croak  [facility] 06/06/2019, 2:54 PM

## 2019-06-06 NOTE — Evaluation (Signed)
Occupational Therapy Evaluation Patient Details Name: Drew Nguyen MRN: [id number] DOB: 15-May-1956 Today's Date: 06/06/2019    History of Present Illness Drew Nguyen is a 63 y.o. male with medical history significant of COPD, RA, HTN, cocaine abuse which was still ongoing as of Aug.Patient had L corona radiata small vessel stroke in Aug. Patient also has a vocal cord lesion suspicious for malignancy.Patient presents to the ED with confusion / disorientation. MRI: new acute ischemic stroke in the R corona radiata   Clinical Impression   This 63 yo male admitted with above presents to acute OT at an independent to Mod I level. His biggest issue he reports is feeling so fatigued and forgetful as the day goes on as well as feeling more irritable easily since CVA. I sent a chat text to MD to see if CM/SW could get involved to see if there are any support services for him due to him providing much of the care for his 3 kids (6,9,12) and wife has psych issues. No further OT needs, we will sign off.     Follow Up Recommendations  Other (comment)(A at home for day to day activties)    Equipment Recommendations  None recommended by OT       Precautions / Restrictions Precautions Precautions: None      Mobility Bed Mobility Overal bed mobility: Independent                Transfers Overall transfer level: Modified independent Equipment used: None             General transfer comment: Slower to rise, no assist needed    Balance Overall balance assessment: Mild deficits observed, not formally tested                                         ADL either performed or assessed with clinical judgement   ADL Overall ADL's : Modified independent                                       General ADL Comments: Increased time for tasks per pt as day goes on. He did fine with me this AM.     Vision Baseline Vision/History: Wears glasses Wears  Glasses: At all times Patient Visual Report: No change from baseline Vision Assessment?: Yes Eye Alignment: Within Functional Limits Ocular Range of Motion: Within Functional Limits Alignment/Gaze Preference: Within Defined Limits Tracking/Visual Pursuits: Able to track stimulus in all quads without difficulty Saccades: Decreased speed of saccadic movement Convergence: Within functional limits Visual Fields: No apparent deficits Additional Comments: talked to pt about having someone else to drive for now since he has a new stroke now         Hand Dominance Right   Extremity/Trunk Assessment Upper Extremity Assessment Upper Extremity Assessment: Overall WFL for tasks assessed           Communication Communication Communication: Expressive difficulties("stuttering")   Cognition Arousal/Alertness: Awake/alert Behavior During Therapy: WFL for tasks assessed/performed Overall Cognitive Status: Impaired/Different from baseline Area of Impairment: Memory                     Memory: Decreased short-term memory         General Comments: Pt A&Ox4, not oriented to day of week,  stating it was Thursday. 1/3 short term memory recall, 2/3 with cueing. Pt reports since prior stroke in August, increased fatigue in afternoons, feeling "ill," when he wakes up from naps, increased confusion and forgetfulness               Home Living Family/patient expects to be discharged to:: Private residence Living Arrangements: Children Available Help at Discharge: Family;Available 24 hours/day Type of Home: House Home Access: Level entry     Home Layout: One level     Bathroom Shower/Tub: Occupational psychologist: Handicapped height     Home Equipment: Environmental consultant - 2 wheels;Wheelchair - manual;Bedside commode   Additional Comments: 3 children, ages 21,9,12      Prior Functioning/Environment Level of Independence: Independent        Comments: Driving, retired, takes  care of all 3 children; wife works part time but has psych issues so pt states he does most of the things with kids and house        OT Problem List: Decreased activity tolerance         OT Goals(Current goals can be found in the care plan section) Acute Rehab OT Goals Patient Stated Goal: "take care of my children and not feel so tired."  OT Frequency:             Co-evaluation PT/OT/SLP Co-Evaluation/Treatment: Yes Reason for Co-Treatment: For patient/therapist safety;To address functional/ADL transfers          AM-PAC OT "6 Clicks" Daily Activity     Outcome Measure Help from another person eating meals?: None Help from another person taking care of personal grooming?: None Help from another person toileting, which includes using toliet, bedpan, or urinal?: None Help from another person bathing (including washing, rinsing, drying)?: None Help from another person to put on and taking off regular upper body clothing?: None Help from another person to put on and taking off regular lower body clothing?: None 6 Click Score: 24   End of Session Equipment Utilized During Treatment: Gait belt  Activity Tolerance: Patient tolerated treatment well Patient left: in bed;with call bell/phone within reach(both side rails up)  OT Visit Diagnosis: Muscle weakness (generalized) (M62.81)                Time: 5597-4163 OT Time Calculation (min): 25 min Charges:  OT General Charges $OT Visit: 1 Visit OT Evaluation $OT Eval Moderate Complexity: 1 Mod  [address], OTR/L Acute [employer] Pager [phone] Office [phone]     Almon Register 06/06/2019, 2:46 PM

## 2019-06-06 NOTE — Consult Note (Signed)
Neurology Consultation Reason for Consult: Stroke Referring Physician: Sheran Luz  CC: Confusion  History is obtained from: Patient, chart  HPI: Drew Nguyen is a 63 y.o. male who presents with confusion that worsened yesterday.  He states that he felt like he was very irritable and getting kicked off at his family.  He states that he cannot really answer questions appropriately and is for this reason that he sought care in the emergency department.  He denies any new numbness, but states that he feels generally weak all over.  In the ER, he had a MRI of his brain which revealed an acute subcortical stroke on the right.   LKW: Unclear tpa given?: no, unclear time of onset    ROS: A 14 point ROS was performed and is negative except as noted in the HPI.   Past Medical History:  Diagnosis Date  . COPD (chronic obstructive pulmonary disease) ([place])   . Current smoker   . Depression   . Hypertension   . RA (rheumatoid arthritis) ([place])   . Tobacco abuse      Family History  Problem Relation Age of Onset  . Hypertension Mother   . Heart attack Mother   . Heart attack Father   . Brain cancer Brother   . Colon cancer Maternal Aunt   . Stomach cancer Neg Hx   . Esophageal cancer Neg Hx   . Pancreatic cancer Neg Hx      Social History:  reports that he has been smoking cigarettes. He has a 72.00 pack-year smoking history. He has never used smokeless tobacco. He reports current alcohol use. He reports current drug use. Drugs: Cocaine and Benzodiazepines.   Exam: Current vital signs: BP (!) 152/83   Pulse 67   Temp (!) 97.5 F (36.4 C)   Resp 14   Ht 5\' 6"  (1.676 m)   Wt 63.5 kg   SpO2 99%   BMI 22.60 kg/m  Vital signs in last 24 hours: Temp:  [97.5 F (36.4 C)] 97.5 F (36.4 C) (10/15 2146) Pulse Rate:  [67-82] 67 (10/16 0254) Resp:  [14-19] 14 (10/16 0254) BP: (152-184)/(83-99) 152/83 (10/16 0254) SpO2:  [99 %] 99 % (10/16 0254) Weight:  [63.5 kg] 63.5 kg (10/15  2148)   Physical Exam  Constitutional: Appears well-developed and well-nourished.  Psych: Affect appropriate to situation Eyes: No scleral injection HENT: No OP obstrucion Head: Normocephalic.  Cardiovascular: Normal rate and regular rhythm.  Respiratory: Effort normal, non-labored breathing GI: Soft.  No distension. There is no tenderness.  Skin: WDI  Neuro: Mental Status: Patient is awake, alert, oriented to person, place, month, year, and situation. Patient is able to give a clear and coherent history. No signs of aphasia or neglect Cranial Nerves: II: Visual Fields are full. Pupils are equal, round, and reactive to light.   III,IV, VI: EOMI without ptosis or diploplia.  V: Facial sensation is symmetric to temperature VII: Facial movement with mild decreased nasolabial fold on the left VIII: hearing is intact to voice X: Uvula elevates symmetrically XI: Shoulder shrug is symmetric. XII: tongue is midline without atrophy or fasciculations.  Motor: Tone is normal. Bulk is normal.  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  He continues to smoke and I provided smoking cessation counseling.  I suspect is likely secondary to small vessel disease  Recommendations: - HgbA1c, fasting lipid panel - Frequent neuro checks - Echocardiogram - Carotid dopplers - Prophylactic therapy-Antiplatelet med: Aspirin - dose 325mg  PO or 300mg  PR and Plavix 75 mg daily for 300 mg load - Risk  factor modification - Telemetry monitoring - PT consult, OT consult, Speech consult - Stroke team to follow   Roland Rack, MD [facility] [phone]  If 7pm- 7am, please page neurology on call as listed in [place].

## 2019-06-06 NOTE — ED Notes (Signed)
Lunch tray set up at bedside, pt assisted to side of bed to eat, per his request

## 2019-06-06 NOTE — ED Notes (Signed)
Dr. Pietro Cassis paged to [place], RN paged by Levada Dy

## 2019-06-06 NOTE — ED Notes (Signed)
Dr. Pietro Cassis called and advised this RN he will come look at the patient in the next little bit and may be able to discharge him home from the ED.

## 2019-06-06 NOTE — ED Notes (Signed)
[name] (Minor 63 years old)  [phone] Adult Neighbor-Lynn Hathanay [phone]

## 2019-06-06 NOTE — ED Notes (Signed)
Patient verbalized understanding of dc instructions, vss.

## 2019-06-11 ENCOUNTER — Ambulatory Visit (INDEPENDENT_AMBULATORY_CARE_PROVIDER_SITE_OTHER): Payer: Medicaid Other | Admitting: Adult Health

## 2019-06-11 ENCOUNTER — Encounter: Payer: Self-pay | Admitting: Adult Health

## 2019-06-11 ENCOUNTER — Other Ambulatory Visit: Payer: Self-pay

## 2019-06-11 VITALS — BP 146/72 | HR 71 | Temp 98.0°F | Ht 66.0 in | Wt 147.0 lb

## 2019-06-11 DIAGNOSIS — R269 Unspecified abnormalities of gait and mobility: Secondary | ICD-10-CM | POA: Diagnosis not present

## 2019-06-11 DIAGNOSIS — R471 Dysarthria and anarthria: Secondary | ICD-10-CM

## 2019-06-11 DIAGNOSIS — E785 Hyperlipidemia, unspecified: Secondary | ICD-10-CM

## 2019-06-11 DIAGNOSIS — I10 Essential (primary) hypertension: Secondary | ICD-10-CM | POA: Diagnosis not present

## 2019-06-11 DIAGNOSIS — Z720 Tobacco use: Secondary | ICD-10-CM

## 2019-06-11 DIAGNOSIS — I639 Cerebral infarction, unspecified: Secondary | ICD-10-CM | POA: Diagnosis not present

## 2019-06-11 DIAGNOSIS — F329 Major depressive disorder, single episode, unspecified: Secondary | ICD-10-CM

## 2019-06-11 NOTE — Patient Instructions (Addendum)
Ensure you start the Celexa 10mg  to help with depression and anxiety  You will be called to start home health physical and speech therapy  Continue to follow with ENT as scheduled  Continue aspirin 81 mg daily and clopidogrel 75 mg daily  and cholesterol  for secondary stroke prevention  Stop aspirin and continue plavix alone after 2 additional weeks   Continue to follow up with PCP regarding cholesterol and blood pressure management   Continue to monitor blood pressure at home  Maintain strict control of hypertension with blood pressure goal below 130/90, diabetes with hemoglobin A1c goal below 6.5% and cholesterol with LDL cholesterol (bad cholesterol) goal below 70 mg/dL. I also advised the patient to eat a healthy diet with plenty of whole grains, cereals, fruits and vegetables, exercise regularly and maintain ideal body weight.  Followup in the future with me in 3 months or call earlier if needed       Thank you for coming to see [place] at [facility]. I hope we have been able to provide you high quality care today.  You may receive a patient satisfaction survey over the next few weeks. We would appreciate your feedback and comments so that we may continue to improve ourselves and the health of our patients.

## 2019-06-11 NOTE — Progress Notes (Signed)
[facility] [address] [place] [address] [phone]       HOSPITAL FOLLOW UP NOTE  [name] Date of Birth:  June 17, 1956 Medical Record Number:  [id number]   Reason for Referral:  hospital stroke follow up    CHIEF COMPLAINT:  Chief Complaint  Patient presents with   Hospitalization Follow-up    Brother present. Treatment room. Patient mentioned that he is having sharp pains in his heads. He is still having numbness in his face. He stated he would like to discuss his condition in detail.     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  History obtained from patient, brother-in-law and chart review. Reviewed all radiology images and labs personally.  Drew Nguyen a 63 y.o.malepresented on 04/19/2019 with right sided weakness and slurred speech for the past 3 days. Also has had impaired gait with stumbling to the right. He endorses that the speech is not just slurred, but also involves difficulty with word finding and new onset stuttering of speech. He was concerned that he was having a stroke and decided to come to the ED for evaluation.  Stroke work-up showed acute infarction affecting the left putaman and radiating white matter tracts as evidenced on MRI secondary to small vessel disease source as evidenced on MRI.  He did not receive IV tPA due to late presentation.  CTA head/neck showed bilateral 50% stenosis.  2D echo unremarkable.  LDL 62.  A1c 5.1.  UDS positive for benzos and cocaine.  Recommended DAPT for 3 weeks and aspirin alone.  HTN stable.  Initiated atorvastatin 40 mg daily for HLD management.  Other stroke risk factors include advanced age, tobacco use, EtOH use and substance abuse but no prior history of stroke.  He was discharged home in stable condition with  recommendation of outpatient therapy. He then presented to ED on 06/05/2019 with increased confusion and irritability.  Stroke work-up showed incidental right subcortical infarct secondary to small vessel disease.  Recommended DAPT for 3 weeks and Plavix alone.  He was discharged home in stable condition.  Drew Nguyen is being seen today for hospital follow up accompanied by his brother. Residual deficits of right-sided weakness, dysarthria and gait imbalance.  He endorses waxing/waning of symptoms and at times can have worsening speech, short-term memory loss, confusion, and worsening balance.  He does endorse excessive amount of stress in regards to medical condition and at home as he is the main caregiver for his 3 children ages 64, 59 and 75.  He is currently prescribed citalopram but endorses only taking 1 dose as medication made him fatigued.  He is also currently being evaluated by ENT for possible esophageal cancer with resultant dysphagia.  Continues on aspirin and plavix without bleeding or bruising. Continues on crestor without myalgias.  Blood pressure today 146/72. he continues to smoke tobacco but has been slowly decreasing.  He declines current substance abuse.  He does have follow-up appointment scheduled with his PCP today.  No further concerns at this time.    ROS:   14 system review of systems performed and negative with exception of speech difficulty, weakness, gait difficulty, anxiety, depression, memory loss  PMH:  Past Medical History:  Diagnosis Date   COPD (chronic obstructive pulmonary disease) ([place])    Current smoker    Depression  Hypertension    RA (rheumatoid arthritis) ([facility])    Tobacco abuse     PSH:  Past Surgical History:  Procedure Laterality Date   ANKLE SURGERY     MULTIPLE TOOTH EXTRACTIONS      Social History:  Social History   Socioeconomic History   Marital status: Single    Spouse name: Not on file   Number of children: Not on file     Years of education: Not on file   Highest education level: Not on file  Occupational History   Occupation: retired  Scientist, product/process development strain: Not on file   Food insecurity    Worry: Not on file    Inability: Not on Lexicographer needs    Medical: Not on file    Non-medical: Not on file  Tobacco Use   Smoking status: Current Every Day Smoker    Packs/day: 1.50    Years: 48.00    Pack years: 72.00    Types: Cigarettes   Smokeless tobacco: Never Used   Tobacco comment: 10/25/18 1 to 1 1/2 ppd  Substance and Sexual Activity   Alcohol use: Yes    Comment: occ   Drug use: Yes    Types: Cocaine, Benzodiazepines   Sexual activity: Not on file  Lifestyle   Physical activity    Days per week: Not on file    Minutes per session: Not on file   Stress: Not on file  Relationships   Social connections    Talks on phone: Not on file    Gets together: Not on file    Attends religious service: Not on file    Active member of club or organization: Not on file    Attends meetings of clubs or organizations: Not on file    Relationship status: Not on file   Intimate partner violence    Fear of current or ex partner: Not on file    Emotionally abused: Not on file    Physically abused: Not on file    Forced sexual activity: Not on file  Other Topics Concern   Not on file  Social History Narrative   Not on file    Family History:  Family History  Problem Relation Age of Onset   Hypertension Mother    Heart attack Mother    Heart attack Father    Brain cancer Brother    Colon cancer Maternal Aunt    Stomach cancer Neg Hx    Esophageal cancer Neg Hx    Pancreatic cancer Neg Hx     Medications:   Current Outpatient Medications on File Prior to Visit  Medication Sig Dispense Refill   amLODipine (NORVASC) 5 MG tablet Take 1 tablet (5 mg total) by mouth daily. 30 tablet 11   aspirin EC 81 MG tablet Take 1 tablet (81 mg total) by  mouth daily for 21 days. 21 tablet 0   cholecalciferol (VITAMIN D) 25 MCG tablet Take 1 tablet (1,000 Units total) by mouth daily. 30 tablet 0   citalopram (CELEXA) 10 MG tablet Take 10 mg by mouth daily.     clopidogrel (PLAVIX) 75 MG tablet Take 1 tablet (75 mg total) by mouth daily. 30 tablet 0   DULoxetine (CYMBALTA) 30 MG capsule Take 30 mg by mouth daily.     fluconazole (DIFLUCAN) 40 MG/ML suspension Take 40 mg by mouth daily.     meloxicam (MOBIC) 7.5 MG tablet Take 7.5 mg by  mouth. 1 to 2 times daily     naproxen sodium (ALEVE) 220 MG tablet Take 220 mg by mouth as needed.     nystatin (MYCOSTATIN) 100000 UNIT/ML suspension Take 10 mLs by mouth 4 (four) times daily.     omeprazole (PRILOSEC) 20 MG capsule Take 1 capsule (20 mg total) by mouth daily. 30 capsule 0   Potassium 99 MG TABS Take 99 mg by mouth daily.     predniSONE (DELTASONE) 10 MG tablet Take 5-10 mg by mouth See admin instructions. 10mg  in the morning and 5mg  in the evening     rosuvastatin (CRESTOR) 5 MG tablet Take 5 mg by mouth every evening.     SYMBICORT 160-4.5 MCG/ACT inhaler Inhale 2 puffs into the lungs 2 (two) times daily.     acetaminophen (TYLENOL) 500 MG tablet Take 500 mg by mouth every 6 (six) hours as needed for mild pain, fever or headache.      COMBIVENT RESPIMAT 20-100 MCG/ACT AERS respimat Inhale 1 puff into the lungs 4 (four) times daily.     hydrochlorothiazide (MICROZIDE) 12.5 MG capsule Take 1 capsule (12.5 mg total) by mouth daily. (Patient not taking: Reported on 06/11/2019) 30 capsule 11   No current facility-administered medications on file prior to visit.     Allergies:   Allergies  Allergen Reactions   Celexa [Citalopram] Other (See Comments)    Over sedation Sleeps too much     Physical Exam  Vitals:   06/11/19 1303  BP: (!) 146/72  Pulse: 71  Temp: 98 F (36.7 C)  TempSrc: Oral  Weight: 147 lb (66.7 kg)  Height: 5\' 6"  (1.676 m)   Body mass index is 23.73  kg/m. No exam data present  General: Frail anxious middle-aged Caucasian male, seated, in no evident distress Head: head normocephalic and atraumatic.   Neck: supple with no carotid or supraclavicular bruits Cardiovascular: regular rate and rhythm, no murmurs Musculoskeletal: no deformity Skin:  no rash/petichiae Vascular:  Normal pulses all extremities   Neurologic Exam Mental Status: Awake and fully alert.   Mild dysarthria.  Oriented to place and time. Recent and remote memory intact. Attention span, concentration and fund of knowledge appropriate throughout visit. Mood and affect anxious. Cranial Nerves: Fundoscopic exam reveals sharp disc margins. Pupils equal, briskly reactive to light. Extraocular movements full without nystagmus. Visual fields full to confrontation. Hearing intact. Facial sensation intact.  Mild right lower facial weakness Motor: Normal bulk and tone. RUE: 4+/5 RLE: 4+/5 Sensory.: intact to touch , pinprick , position and vibratory sensation.  Coordination: Rapid alternating movements decreased in right hand. Finger-to-nose and heel-to-shin mild ataxia on right side and normal on left side. Gait and Station: Arises from chair without difficulty. Stance is slightly hunched.  Abnormal gait with short shuffling steps Reflexes: 1+ and symmetric. Toes downgoing.     NIHSS  2 Modified Rankin  2    Diagnostic Data (Labs, Imaging, Testing)  Ct Angio Head W Or Wo Contrast Ct Angio Neck W Or Wo Contrast 04/19/2019   ADDENDUM REPORT: Third impression should read as follows. Atherosclerotic disease in both carotid siphon regions with 50 percent stenosis on each side. To clarify, there is no stenosis at the carotid bifurcation regions.  IMPRESSION:   Atherosclerotic disease at both carotid bifurcations, but no stenosis when compared to the diameter of the more distal cervical internal carotid arteries.   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 No other  posterior circulation stenosis in the  neck.   Atherosclerotic disease in both carotid siphon regions with 50 percent stenosis on each side. To clarify, there is no stenosis at the carotid bifurcation regions.   Dg Chest 2 View 04/19/2019 IMPRESSION: No active cardiopulmonary disease.   Ct Head Wo Contrast 04/19/2019 IMPRESSION:  1. There are white matter changes in the left corona radiata and a small lacunar infarct in the left basal ganglia, described above, which were not definitely seen previously. However, the most recent comparison is from 2011 and the findings are otherwise age indeterminate but could be nonacute. No other acute intracranial abnormalities are noted.  2. A subcutaneous nodule over the left forehead is of doubtful significance. Recommend clinical correlation.   Mr Brain Wo Contrast (neuro Protocol) 04/19/2019 IMPRESSION: Acute infarction affecting the left putamen and radiating white matter tracts. No mass effect or hemorrhage. Mild chronic small-vessel ischemic change elsewhere affecting the cerebral hemispheric white matter.   ECHOCARDIOGRAM 04/20/2019 IMPRESSIONS  1. The left ventricle has normal systolic function, with an ejection fraction of 55-60%. The cavity size was normal. Left ventricular diastolic parameters were normal. No evidence of left ventricular regional wall motion abnormalities.  2. The right ventricle has normal systolic function. The cavity was normal. There is no increase in right ventricular wall thickness. Right ventricular systolic pressure could not be assessed.  3. Left atrial size was mildly dilated.  4. The aorta is normal unless otherwise noted.    ASSESSMENT: Drew Nguyen is a 63 y.o. year old male presented with right-sided weakness, impaired gait and speech difficulties on 04/19/2019 with stroke work-up revealing acute infarction affecting the left putamen and radiating white matter tracts secondary to small vessel disease source.  Vascular risk factors include HTN, HLD, tobacco use, substance abuse.  Residual deficits of dysarthria, mild right hemiparesis and gait impairment.    PLAN:  1. Acute stroke: Continue aspirin 81 mg daily and clopidogrel 75 mg daily  and atorvastatin for secondary stroke prevention.  Advised to continue DAPT for additional 2 weeks and then stop aspirin and continue Plavix alone.  Maintain strict control of hypertension with blood pressure goal below 130/90, diabetes with hemoglobin A1c goal below 6.5% and cholesterol with LDL cholesterol (bad cholesterol) goal below 70 mg/dL.  I also advised the patient to eat a healthy diet with plenty of whole grains, cereals, fruits and vegetables, exercise regularly with at least 30 minutes of continuous activity daily and maintain ideal body weight. 2. HTN: Advised to continue current treatment regimen.  Today's BP 146/72.  Advised to continue to monitor at home and follow-up with PCP 3. HLD: Advised to continue current treatment regimen along with continued follow-up with PCP for future prescribing and monitoring of lipid panel 4. Residual deficits: Referral placed to home health PT/ST 5. Depression/anxiety: Likely multifactorial including potential cancer diagnosis, recent stroke, and increased home life stressors.  Previously prescribed citalopram 10 mg daily and advised to restart this at nighttime.  Advised him to continue to follow with PCP for ongoing monitoring and management 6. Tobacco use: Highly encouraged cessation and hopefully with stress reduction techniques and citalopram, he can continue to taper tobacco use with complete cessation 7. Esophageal lesion: Continue to follow with ENT     Follow up in 3 months or call earlier if needed   Greater than 50% of time during this 45 minute visit was spent on counseling, explanation of diagnosis of acute strokes, reviewing risk factor management of HTN, HLD and tobacco use, discussion regarding residual  deficits with waxing/waning of symptoms, discussion regarding depression/anxiety, planning of further management along with potential future management, and discussion with patient and family answering all questions.    Frann Rider, AGNP-BC  [facility] [address] [place], [place] [address]  Phone [phone] Fax [phone] Note: This document was prepared with digital dictation and possible smart phrase technology. Any transcriptional errors that result from this process are unintentional.

## 2019-06-11 NOTE — Progress Notes (Signed)
I agree with the above plan 

## 2019-07-07 ENCOUNTER — Telehealth: Payer: Self-pay | Admitting: Adult Health

## 2019-07-07 NOTE — Telephone Encounter (Signed)
Pt has been waiting to be called to schedule his therapy appt but has yet to receive a call. Please advise.

## 2019-07-07 NOTE — Telephone Encounter (Signed)
Called and spoke to patient and we are still looking for a Drew Nguyen to take his insurance . Patient is aware

## 2019-07-31 NOTE — Telephone Encounter (Signed)
I have sent referral to [facility] at [name] to see if she can help . [phone]

## 2019-08-18 NOTE — Telephone Encounter (Signed)
Lashwanda from [employer] will take patient his service  08/20/2019

## 2019-09-11 ENCOUNTER — Ambulatory Visit: Payer: Medicaid Other | Admitting: Adult Health

## 2019-09-11 NOTE — Progress Notes (Deleted)
[facility] [name] [place]. [place] [address] [phone]       STROKE FOLLOW UP NOTE  [name] Date of Birth:  04-10-1956 Medical Record Number:  [id number]   Reason for Referral:  stroke follow up    CHIEF COMPLAINT:  No chief complaint on file.   HPI: Stroke admission 04/19/2019: Drew Nguyen a 64 y.o.malepresented on 04/19/2019 with right sided weakness and slurred speech for the past 3 days. Also has had impaired gait with stumbling to the right. He endorses that the speech is not just slurred, but also involves difficulty with word finding and new onset stuttering of speech. He was concerned that he was having a stroke and decided to come to the ED for evaluation.  Stroke work-up showed acute infarction affecting the left putaman and radiating white matter tracts as evidenced on MRI secondary to small vessel disease source as evidenced on MRI.  He did not receive IV tPA due to late presentation.  CTA head/neck showed bilateral 50% stenosis.  2D echo unremarkable.  LDL 62.  A1c 5.1.  UDS positive for benzos and cocaine.  Recommended DAPT for 3 weeks and aspirin alone.  HTN stable.  Initiated atorvastatin 40 mg daily for HLD management.  Other stroke risk factors include advanced age, tobacco use, EtOH use and substance abuse but no prior history of stroke.  He was discharged home in stable condition with recommendation of outpatient therapy. He then presented to ED on 06/05/2019 with increased confusion and irritability.  Stroke work-up showed incidental right subcortical infarct secondary to small vessel disease.  Recommended DAPT for 3 weeks and Plavix alone.  He was discharged home in stable condition.  Initial visit 06/11/2019: [name] is being seen today for hospital follow up accompanied by his brother. Residual deficits of right-sided weakness, dysarthria and gait imbalance.  He endorses waxing/waning of symptoms and at times can have  worsening speech, short-term memory loss, confusion, and worsening balance.  He does endorse excessive amount of stress in regards to medical condition and at home as he is the main caregiver for his 3 children ages 65, 9 and 15.  He is currently prescribed citalopram but endorses only taking 1 dose as medication made him fatigued.  He is also currently being evaluated by ENT for possible esophageal cancer with resultant dysphagia.  Continues on aspirin and plavix without bleeding or bruising. Continues on crestor without myalgias.  Blood pressure today 146/72. he continues to smoke tobacco but has been slowly decreasing.  He declines current substance abuse.  He does have follow-up appointment scheduled with his PCP today.  No further concerns at this time.  Update 09/11/2019: [name] is a 64 year old male who is being seen today for stroke follow-up.  Residual deficits ***.  Completed 3 months DAPT and continues on Plavix alone without bleeding or bruising.  Continues on Crestor without myalgias.  Blood pressure today ***.       ROS:   14 system review of systems performed and negative with exception of speech difficulty, weakness, gait difficulty, anxiety, depression, memory loss  PMH:  Past Medical History:  Diagnosis Date  . COPD (chronic obstructive pulmonary disease) ([place])   . Current smoker   . Depression   . Hypertension   . RA (rheumatoid arthritis) ([place])   . Tobacco abuse     PSH:  Past Surgical History:  Procedure Laterality Date  . ANKLE SURGERY    . MULTIPLE TOOTH EXTRACTIONS  Social History:  Social History   Socioeconomic History  . Marital status: Single    Spouse name: Not on file  . Number of children: Not on file  . Years of education: Not on file  . Highest education level: Not on file  Occupational History  . Occupation: retired  Tobacco Use  . Smoking status: Current Every Day Smoker    Packs/day: 1.50    Years: 48.00    Pack years: 72.00     Types: Cigarettes  . Smokeless tobacco: Never Used  . Tobacco comment: 10/25/18 1 to 1 1/2 ppd  Substance and Sexual Activity  . Alcohol use: Yes    Comment: occ  . Drug use: Yes    Types: Cocaine, Benzodiazepines  . Sexual activity: Not on file  Other Topics Concern  . Not on file  Social History Narrative  . Not on file   Social Determinants of Health   Financial Resource Strain:   . Difficulty of Paying Living Expenses: Not on file  Food Insecurity:   . Worried About Charity fundraiser in the Last Year: Not on file  . Ran Out of Food in the Last Year: Not on file  Transportation Needs:   . Lack of Transportation (Medical): Not on file  . Lack of Transportation (Non-Medical): Not on file  Physical Activity:   . Days of Exercise per Week: Not on file  . Minutes of Exercise per Session: Not on file  Stress:   . Feeling of Stress : Not on file  Social Connections:   . Frequency of Communication with Friends and Family: Not on file  . Frequency of Social Gatherings with Friends and Family: Not on file  . Attends Religious Services: Not on file  . Active Member of Clubs or Organizations: Not on file  . Attends Archivist Meetings: Not on file  . Marital Status: Not on file  Intimate Partner Violence:   . Fear of Current or Ex-Partner: Not on file  . Emotionally Abused: Not on file  . Physically Abused: Not on file  . Sexually Abused: Not on file    Family History:  Family History  Problem Relation Age of Onset  . Hypertension Mother   . Heart attack Mother   . Heart attack Father   . Brain cancer Brother   . Colon cancer Maternal Aunt   . Stomach cancer Neg Hx   . Esophageal cancer Neg Hx   . Pancreatic cancer Neg Hx     Medications:   Current Outpatient Medications on File Prior to Visit  Medication Sig Dispense Refill  . acetaminophen (TYLENOL) 500 MG tablet Take 500 mg by mouth every 6 (six) hours as needed for mild pain, fever or headache.     Marland Kitchen  amLODipine (NORVASC) 5 MG tablet Take 1 tablet (5 mg total) by mouth daily. 30 tablet 11  . cholecalciferol (VITAMIN D) 25 MCG tablet Take 1 tablet (1,000 Units total) by mouth daily. 30 tablet 0  . citalopram (CELEXA) 10 MG tablet Take 10 mg by mouth daily.    . clopidogrel (PLAVIX) 75 MG tablet Take 1 tablet (75 mg total) by mouth daily. 30 tablet 0  . COMBIVENT RESPIMAT 20-100 MCG/ACT AERS respimat Inhale 1 puff into the lungs 4 (four) times daily.    . DULoxetine (CYMBALTA) 30 MG capsule Take 30 mg by mouth daily.    . fluconazole (DIFLUCAN) 40 MG/ML suspension Take 40 mg by mouth daily.    Marland Kitchen  hydrochlorothiazide (MICROZIDE) 12.5 MG capsule Take 1 capsule (12.5 mg total) by mouth daily. (Patient not taking: Reported on 06/11/2019) 30 capsule 11  . meloxicam (MOBIC) 7.5 MG tablet Take 7.5 mg by mouth. 1 to 2 times daily    . naproxen sodium (ALEVE) 220 MG tablet Take 220 mg by mouth as needed.    . nystatin (MYCOSTATIN) 100000 UNIT/ML suspension Take 10 mLs by mouth 4 (four) times daily.    Marland Kitchen omeprazole (PRILOSEC) 20 MG capsule Take 1 capsule (20 mg total) by mouth daily. 30 capsule 0  . Potassium 99 MG TABS Take 99 mg by mouth daily.    . predniSONE (DELTASONE) 10 MG tablet Take 5-10 mg by mouth See admin instructions. 10mg  in the morning and 5mg  in the evening    . rosuvastatin (CRESTOR) 5 MG tablet Take 5 mg by mouth every evening.    . SYMBICORT 160-4.5 MCG/ACT inhaler Inhale 2 puffs into the lungs 2 (two) times daily.     No current facility-administered medications on file prior to visit.    Allergies:   Allergies  Allergen Reactions  . Celexa [Citalopram] Other (See Comments)    Over sedation Sleeps too much     Physical Exam  There were no vitals filed for this visit. There is no height or weight on file to calculate BMI. No exam data present  General: Frail anxious middle-aged Caucasian male, seated, in no evident distress Head: head normocephalic and atraumatic.     Neck: supple with no carotid or supraclavicular bruits Cardiovascular: regular rate and rhythm, no murmurs Musculoskeletal: no deformity Skin:  no rash/petichiae Vascular:  Normal pulses all extremities   Neurologic Exam Mental Status: Awake and fully alert.   Mild dysarthria.  Oriented to place and time. Recent and remote memory intact. Attention span, concentration and fund of knowledge appropriate throughout visit. Mood and affect anxious. Cranial Nerves: Fundoscopic exam reveals sharp disc margins. Pupils equal, briskly reactive to light. Extraocular movements full without nystagmus. Visual fields full to confrontation. Hearing intact. Facial sensation intact.  Mild right lower facial weakness Motor: Normal bulk and tone. RUE: 4+/5 RLE: 4+/5 Sensory.: intact to touch , pinprick , position and vibratory sensation.  Coordination: Rapid alternating movements decreased in right hand. Finger-to-nose and heel-to-shin mild ataxia on right side and normal on left side. Gait and Station: Arises from chair without difficulty. Stance is slightly hunched.  Abnormal gait with short shuffling steps Reflexes: 1+ and symmetric. Toes downgoing.     NIHSS  2 Modified Rankin  2    Diagnostic Data (Labs, Imaging, Testing)  Ct Angio Head W Or Wo Contrast Ct Angio Neck W Or Wo Contrast 04/19/2019   ADDENDUM REPORT: Third impression should read as follows. Atherosclerotic disease in both carotid siphon regions with 50 percent stenosis on each side. To clarify, there is no stenosis at the carotid bifurcation regions.  IMPRESSION:   Atherosclerotic disease at both carotid bifurcations, but no stenosis when compared to the diameter of the more distal cervical internal carotid arteries.   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 No other posterior circulation stenosis in the neck.   Atherosclerotic disease in both carotid siphon regions with 50 percent stenosis on each side. To clarify, there  is no stenosis at the carotid bifurcation regions.   Dg Chest 2 View 04/19/2019 IMPRESSION: No active cardiopulmonary disease.   Ct Head Wo Contrast 04/19/2019 IMPRESSION:  1. There are white matter changes in the left corona radiata  and a small lacunar infarct in the left basal ganglia, described above, which were not definitely seen previously. However, the most recent comparison is from 2011 and the findings are otherwise age indeterminate but could be nonacute. No other acute intracranial abnormalities are noted.  2. A subcutaneous nodule over the left forehead is of doubtful significance. Recommend clinical correlation.   Mr Brain Wo Contrast (neuro Protocol) 04/19/2019 IMPRESSION: Acute infarction affecting the left putamen and radiating white matter tracts. No mass effect or hemorrhage. Mild chronic small-vessel ischemic change elsewhere affecting the cerebral hemispheric white matter.   ECHOCARDIOGRAM 04/20/2019 IMPRESSIONS  1. The left ventricle has normal systolic function, with an ejection fraction of 55-60%. The cavity size was normal. Left ventricular diastolic parameters were normal. No evidence of left ventricular regional wall motion abnormalities.  2. The right ventricle has normal systolic function. The cavity was normal. There is no increase in right ventricular wall thickness. Right ventricular systolic pressure could not be assessed.  3. Left atrial size was mildly dilated.  4. The aorta is normal unless otherwise noted.    ASSESSMENT: NATTHEW MARLATT is a 64 y.o. year old male presented with right-sided weakness, impaired gait and speech difficulties on 04/19/2019 with stroke work-up revealing acute infarction affecting the left putamen and radiating white matter tracts secondary to small vessel disease source. Vascular risk factors include HTN, HLD, tobacco use, substance abuse.  Residual deficits of dysarthria, mild right hemiparesis and gait  impairment.    PLAN:  1. Acute stroke: Continue aspirin 81 mg daily and clopidogrel 75 mg daily  and atorvastatin for secondary stroke prevention.  Advised to continue DAPT for additional 2 weeks and then stop aspirin and continue Plavix alone.  Maintain strict control of hypertension with blood pressure goal below 130/90, diabetes with hemoglobin A1c goal below 6.5% and cholesterol with LDL cholesterol (bad cholesterol) goal below 70 mg/dL.  I also advised the patient to eat a healthy diet with plenty of whole grains, cereals, fruits and vegetables, exercise regularly with at least 30 minutes of continuous activity daily and maintain ideal body weight. 2. HTN: Advised to continue current treatment regimen.  Today's BP 146/72.  Advised to continue to monitor at home and follow-up with PCP 3. HLD: Advised to continue current treatment regimen along with continued follow-up with PCP for future prescribing and monitoring of lipid panel 4. Residual deficits: Referral placed to home health PT/ST 5. Depression/anxiety: Likely multifactorial including potential cancer diagnosis, recent stroke, and increased home life stressors.  Previously prescribed citalopram 10 mg daily and advised to restart this at nighttime.  Advised him to continue to follow with PCP for ongoing monitoring and management 6. Tobacco use: Highly encouraged cessation and hopefully with stress reduction techniques and citalopram, he can continue to taper tobacco use with complete cessation 7. Esophageal lesion: Continue to follow with ENT     Follow up in 3 months or call earlier if needed   Greater than 50% of time during this 45 minute visit was spent on counseling, explanation of diagnosis of acute strokes, reviewing risk factor management of HTN, HLD and tobacco use, discussion regarding residual deficits with waxing/waning of symptoms, discussion regarding depression/anxiety, planning of further management along with potential  future management, and discussion with patient and family answering all questions.    Frann Rider, AGNP-BC  [facility] [address] [place], [place] [address]  Phone [phone] Fax [phone] Note: This document was prepared with digital dictation and possible  smart Company secretary. Any transcriptional errors that result from this process are unintentional.

## 2019-10-14 ENCOUNTER — Telehealth: Payer: Self-pay | Admitting: Adult Health

## 2019-10-14 NOTE — Telephone Encounter (Signed)
Pt along with his assistant/friend of the family- [employer](not on [place]) has called while next to pt stating pt is worsening, his gait has worsen, mental status of pt has worsen also.  Sylvester Harder stated it was originally thought to be because of Prednisone change as to why pt is declining.  Sylvester Harder was told because she is not on the [facility] no information could be relayed to her outside of confirming appointment information.  Pt would like a call if there is anyway to get him in earlier(pt is on wait list)

## 2019-10-15 NOTE — Telephone Encounter (Signed)
I called pt and he wanted me to speak to [place], at the home. I explained per DPR (next visit for him to sign her in).  She relayed that pt having worsening mental, mobility, speech issues.  Related possibly to decrease in prednisone from 15mg  po daily to 10mg  daily. Has seen new rheumatologist (on embrel and other pain med).  ? Had another stroke or related to prednisone.  If thinks other stroke to go to ED, she verbalized understanding.  Next available appt is 10-27-19 with JM/NP as in office.  Arrive 30 min prior appt.  She verbalized understanding.

## 2019-10-16 ENCOUNTER — Other Ambulatory Visit: Payer: Self-pay

## 2019-10-16 ENCOUNTER — Emergency Department (HOSPITAL_COMMUNITY): Payer: Medicaid Other

## 2019-10-16 ENCOUNTER — Other Ambulatory Visit (HOSPITAL_COMMUNITY): Payer: Self-pay

## 2019-10-16 ENCOUNTER — Inpatient Hospital Stay (HOSPITAL_COMMUNITY)
Admission: EM | Admit: 2019-10-16 | Discharge: 2019-11-20 | DRG: 981 | Disposition: E | Payer: Medicaid Other | Attending: Internal Medicine | Admitting: Internal Medicine

## 2019-10-16 ENCOUNTER — Encounter (HOSPITAL_COMMUNITY): Payer: Self-pay | Admitting: Emergency Medicine

## 2019-10-16 DIAGNOSIS — Z888 Allergy status to other drugs, medicaments and biological substances status: Secondary | ICD-10-CM

## 2019-10-16 DIAGNOSIS — Z808 Family history of malignant neoplasm of other organs or systems: Secondary | ICD-10-CM

## 2019-10-16 DIAGNOSIS — J9601 Acute respiratory failure with hypoxia: Secondary | ICD-10-CM | POA: Diagnosis not present

## 2019-10-16 DIAGNOSIS — E785 Hyperlipidemia, unspecified: Secondary | ICD-10-CM | POA: Diagnosis present

## 2019-10-16 DIAGNOSIS — R1310 Dysphagia, unspecified: Secondary | ICD-10-CM | POA: Diagnosis present

## 2019-10-16 DIAGNOSIS — E871 Hypo-osmolality and hyponatremia: Secondary | ICD-10-CM | POA: Diagnosis present

## 2019-10-16 DIAGNOSIS — B370 Candidal stomatitis: Secondary | ICD-10-CM | POA: Diagnosis not present

## 2019-10-16 DIAGNOSIS — J439 Emphysema, unspecified: Secondary | ICD-10-CM | POA: Diagnosis present

## 2019-10-16 DIAGNOSIS — Z66 Do not resuscitate: Secondary | ICD-10-CM | POA: Diagnosis not present

## 2019-10-16 DIAGNOSIS — G92 Toxic encephalopathy: Secondary | ICD-10-CM | POA: Diagnosis present

## 2019-10-16 DIAGNOSIS — E874 Mixed disorder of acid-base balance: Secondary | ICD-10-CM | POA: Diagnosis not present

## 2019-10-16 DIAGNOSIS — C787 Secondary malignant neoplasm of liver and intrahepatic bile duct: Secondary | ICD-10-CM | POA: Diagnosis present

## 2019-10-16 DIAGNOSIS — Z800 Family history of malignant neoplasm of digestive organs: Secondary | ICD-10-CM

## 2019-10-16 DIAGNOSIS — D6959 Other secondary thrombocytopenia: Secondary | ICD-10-CM | POA: Diagnosis not present

## 2019-10-16 DIAGNOSIS — Z515 Encounter for palliative care: Secondary | ICD-10-CM

## 2019-10-16 DIAGNOSIS — R627 Adult failure to thrive: Secondary | ICD-10-CM | POA: Diagnosis present

## 2019-10-16 DIAGNOSIS — R569 Unspecified convulsions: Principal | ICD-10-CM

## 2019-10-16 DIAGNOSIS — R05 Cough: Secondary | ICD-10-CM

## 2019-10-16 DIAGNOSIS — D500 Iron deficiency anemia secondary to blood loss (chronic): Secondary | ICD-10-CM | POA: Diagnosis present

## 2019-10-16 DIAGNOSIS — I10 Essential (primary) hypertension: Secondary | ICD-10-CM | POA: Diagnosis present

## 2019-10-16 DIAGNOSIS — R042 Hemoptysis: Secondary | ICD-10-CM

## 2019-10-16 DIAGNOSIS — C3492 Malignant neoplasm of unspecified part of left bronchus or lung: Secondary | ICD-10-CM | POA: Diagnosis present

## 2019-10-16 DIAGNOSIS — R590 Localized enlarged lymph nodes: Secondary | ICD-10-CM

## 2019-10-16 DIAGNOSIS — R079 Chest pain, unspecified: Secondary | ICD-10-CM

## 2019-10-16 DIAGNOSIS — M069 Rheumatoid arthritis, unspecified: Secondary | ICD-10-CM | POA: Diagnosis present

## 2019-10-16 DIAGNOSIS — J9690 Respiratory failure, unspecified, unspecified whether with hypoxia or hypercapnia: Secondary | ICD-10-CM

## 2019-10-16 DIAGNOSIS — J9602 Acute respiratory failure with hypercapnia: Secondary | ICD-10-CM | POA: Diagnosis not present

## 2019-10-16 DIAGNOSIS — T451X5A Adverse effect of antineoplastic and immunosuppressive drugs, initial encounter: Secondary | ICD-10-CM | POA: Diagnosis not present

## 2019-10-16 DIAGNOSIS — E876 Hypokalemia: Secondary | ICD-10-CM

## 2019-10-16 DIAGNOSIS — T380X5A Adverse effect of glucocorticoids and synthetic analogues, initial encounter: Secondary | ICD-10-CM | POA: Diagnosis not present

## 2019-10-16 DIAGNOSIS — Z781 Physical restraint status: Secondary | ICD-10-CM

## 2019-10-16 DIAGNOSIS — Z7952 Long term (current) use of systemic steroids: Secondary | ICD-10-CM

## 2019-10-16 DIAGNOSIS — E8809 Other disorders of plasma-protein metabolism, not elsewhere classified: Secondary | ICD-10-CM | POA: Diagnosis not present

## 2019-10-16 DIAGNOSIS — C7951 Secondary malignant neoplasm of bone: Secondary | ICD-10-CM | POA: Diagnosis present

## 2019-10-16 DIAGNOSIS — F10239 Alcohol dependence with withdrawal, unspecified: Secondary | ICD-10-CM | POA: Diagnosis present

## 2019-10-16 DIAGNOSIS — M810 Age-related osteoporosis without current pathological fracture: Secondary | ICD-10-CM | POA: Diagnosis present

## 2019-10-16 DIAGNOSIS — I6300 Cerebral infarction due to thrombosis of unspecified precerebral artery: Secondary | ICD-10-CM | POA: Diagnosis not present

## 2019-10-16 DIAGNOSIS — Z8249 Family history of ischemic heart disease and other diseases of the circulatory system: Secondary | ICD-10-CM

## 2019-10-16 DIAGNOSIS — Z4659 Encounter for fitting and adjustment of other gastrointestinal appliance and device: Secondary | ICD-10-CM

## 2019-10-16 DIAGNOSIS — J449 Chronic obstructive pulmonary disease, unspecified: Secondary | ICD-10-CM | POA: Diagnosis present

## 2019-10-16 DIAGNOSIS — D72829 Elevated white blood cell count, unspecified: Secondary | ICD-10-CM

## 2019-10-16 DIAGNOSIS — I160 Hypertensive urgency: Secondary | ICD-10-CM | POA: Diagnosis not present

## 2019-10-16 DIAGNOSIS — F329 Major depressive disorder, single episode, unspecified: Secondary | ICD-10-CM | POA: Diagnosis present

## 2019-10-16 DIAGNOSIS — I69322 Dysarthria following cerebral infarction: Secondary | ICD-10-CM

## 2019-10-16 DIAGNOSIS — Z9289 Personal history of other medical treatment: Secondary | ICD-10-CM

## 2019-10-16 DIAGNOSIS — J9600 Acute respiratory failure, unspecified whether with hypoxia or hypercapnia: Secondary | ICD-10-CM

## 2019-10-16 DIAGNOSIS — I69391 Dysphagia following cerebral infarction: Secondary | ICD-10-CM

## 2019-10-16 DIAGNOSIS — J189 Pneumonia, unspecified organism: Secondary | ICD-10-CM | POA: Diagnosis present

## 2019-10-16 DIAGNOSIS — K7290 Hepatic failure, unspecified without coma: Secondary | ICD-10-CM | POA: Diagnosis not present

## 2019-10-16 DIAGNOSIS — Z20822 Contact with and (suspected) exposure to covid-19: Secondary | ICD-10-CM | POA: Diagnosis present

## 2019-10-16 DIAGNOSIS — I4891 Unspecified atrial fibrillation: Secondary | ICD-10-CM | POA: Diagnosis not present

## 2019-10-16 DIAGNOSIS — E1165 Type 2 diabetes mellitus with hyperglycemia: Secondary | ICD-10-CM | POA: Diagnosis not present

## 2019-10-16 DIAGNOSIS — C3412 Malignant neoplasm of upper lobe, left bronchus or lung: Secondary | ICD-10-CM | POA: Diagnosis present

## 2019-10-16 DIAGNOSIS — R0602 Shortness of breath: Secondary | ICD-10-CM

## 2019-10-16 DIAGNOSIS — R933 Abnormal findings on diagnostic imaging of other parts of digestive tract: Secondary | ICD-10-CM

## 2019-10-16 DIAGNOSIS — C3490 Malignant neoplasm of unspecified part of unspecified bronchus or lung: Secondary | ICD-10-CM

## 2019-10-16 DIAGNOSIS — R059 Cough, unspecified: Secondary | ICD-10-CM

## 2019-10-16 DIAGNOSIS — F17210 Nicotine dependence, cigarettes, uncomplicated: Secondary | ICD-10-CM | POA: Diagnosis present

## 2019-10-16 DIAGNOSIS — Z79899 Other long term (current) drug therapy: Secondary | ICD-10-CM

## 2019-10-16 DIAGNOSIS — Z791 Long term (current) use of non-steroidal anti-inflammatories (NSAID): Secondary | ICD-10-CM

## 2019-10-16 DIAGNOSIS — G9340 Encephalopathy, unspecified: Secondary | ICD-10-CM

## 2019-10-16 DIAGNOSIS — I639 Cerebral infarction, unspecified: Secondary | ICD-10-CM | POA: Diagnosis present

## 2019-10-16 DIAGNOSIS — J440 Chronic obstructive pulmonary disease with acute lower respiratory infection: Secondary | ICD-10-CM | POA: Diagnosis present

## 2019-10-16 DIAGNOSIS — Z7951 Long term (current) use of inhaled steroids: Secondary | ICD-10-CM

## 2019-10-16 DIAGNOSIS — E861 Hypovolemia: Secondary | ICD-10-CM | POA: Diagnosis present

## 2019-10-16 DIAGNOSIS — D61810 Antineoplastic chemotherapy induced pancytopenia: Secondary | ICD-10-CM | POA: Diagnosis not present

## 2019-10-16 DIAGNOSIS — Z7902 Long term (current) use of antithrombotics/antiplatelets: Secondary | ICD-10-CM

## 2019-10-16 DIAGNOSIS — K219 Gastro-esophageal reflux disease without esophagitis: Secondary | ICD-10-CM | POA: Diagnosis present

## 2019-10-16 DIAGNOSIS — R0902 Hypoxemia: Secondary | ICD-10-CM

## 2019-10-16 DIAGNOSIS — Z8521 Personal history of malignant neoplasm of larynx: Secondary | ICD-10-CM

## 2019-10-16 DIAGNOSIS — Z6824 Body mass index (BMI) 24.0-24.9, adult: Secondary | ICD-10-CM

## 2019-10-16 LAB — BASIC METABOLIC PANEL
Anion gap: 14 (ref 5–15)
BUN: 12 mg/dL (ref 8–23)
CO2: 31 mmol/L (ref 22–32)
Calcium: 8.5 mg/dL — ABNORMAL LOW (ref 8.9–10.3)
Chloride: 77 mmol/L — ABNORMAL LOW (ref 98–111)
Creatinine, Ser: 0.75 mg/dL (ref 0.61–1.24)
GFR calc Af Amer: >60 mL/min (ref >60)
GFR calc non Af Amer: >60 mL/min (ref >60)
Glucose, Bld: 143 mg/dL — ABNORMAL HIGH (ref 70–99)
Potassium: 2.7 mmol/L — CL (ref 3.5–5.1)
Sodium: 122 mmol/L — ABNORMAL LOW (ref 135–145)

## 2019-10-16 LAB — CBC
HCT: 39.8 % (ref 39.0–52.0)
Hemoglobin: 14.8 g/dL (ref 13.0–17.0)
MCH: 32.2 pg (ref 26.0–34.0)
MCHC: 37.2 g/dL — ABNORMAL HIGH (ref 30.0–36.0)
MCV: 86.5 fL (ref 80.0–100.0)
Platelets: 345 10*3/uL (ref 150–400)
RBC: 4.6 MIL/uL (ref 4.22–5.81)
RDW: 12 % (ref 11.5–15.5)
WBC: 14.4 10*3/uL — ABNORMAL HIGH (ref 4.0–10.5)
nRBC: 0 % (ref 0.0–0.2)

## 2019-10-16 LAB — CBG MONITORING, ED: Glucose-Capillary: 144 mg/dL — ABNORMAL HIGH (ref 70–99)

## 2019-10-16 LAB — URINALYSIS, ROUTINE W REFLEX MICROSCOPIC
Bacteria, UA: NONE SEEN
Bilirubin Urine: NEGATIVE
Glucose, UA: NEGATIVE mg/dL
Hgb urine dipstick: NEGATIVE
Ketones, ur: NEGATIVE mg/dL
Leukocytes,Ua: NEGATIVE
Nitrite: NEGATIVE
Protein, ur: 100 mg/dL — AB
Specific Gravity, Urine: 1.013 (ref 1.005–1.030)
pH: 7 (ref 5.0–8.0)

## 2019-10-16 LAB — RAPID URINE DRUG SCREEN, HOSP PERFORMED
Amphetamines: NOT DETECTED
Barbiturates: NOT DETECTED
Benzodiazepines: NOT DETECTED
Cocaine: NOT DETECTED
Opiates: NOT DETECTED
Tetrahydrocannabinol: NOT DETECTED

## 2019-10-16 LAB — MAGNESIUM: Magnesium: 1.7 mg/dL (ref 1.7–2.4)

## 2019-10-16 LAB — ETHANOL: Alcohol, Ethyl (B): <10 mg/dL (ref <10)

## 2019-10-16 IMAGING — DX DG CHEST 1V PORT
1 series · 1 of 1 positions shown · non-contrast
Comparison: [DATE]

CLINICAL DATA: Seizure, altered mental status

EXAM:
PORTABLE CHEST 1 VIEW

[chest ap]
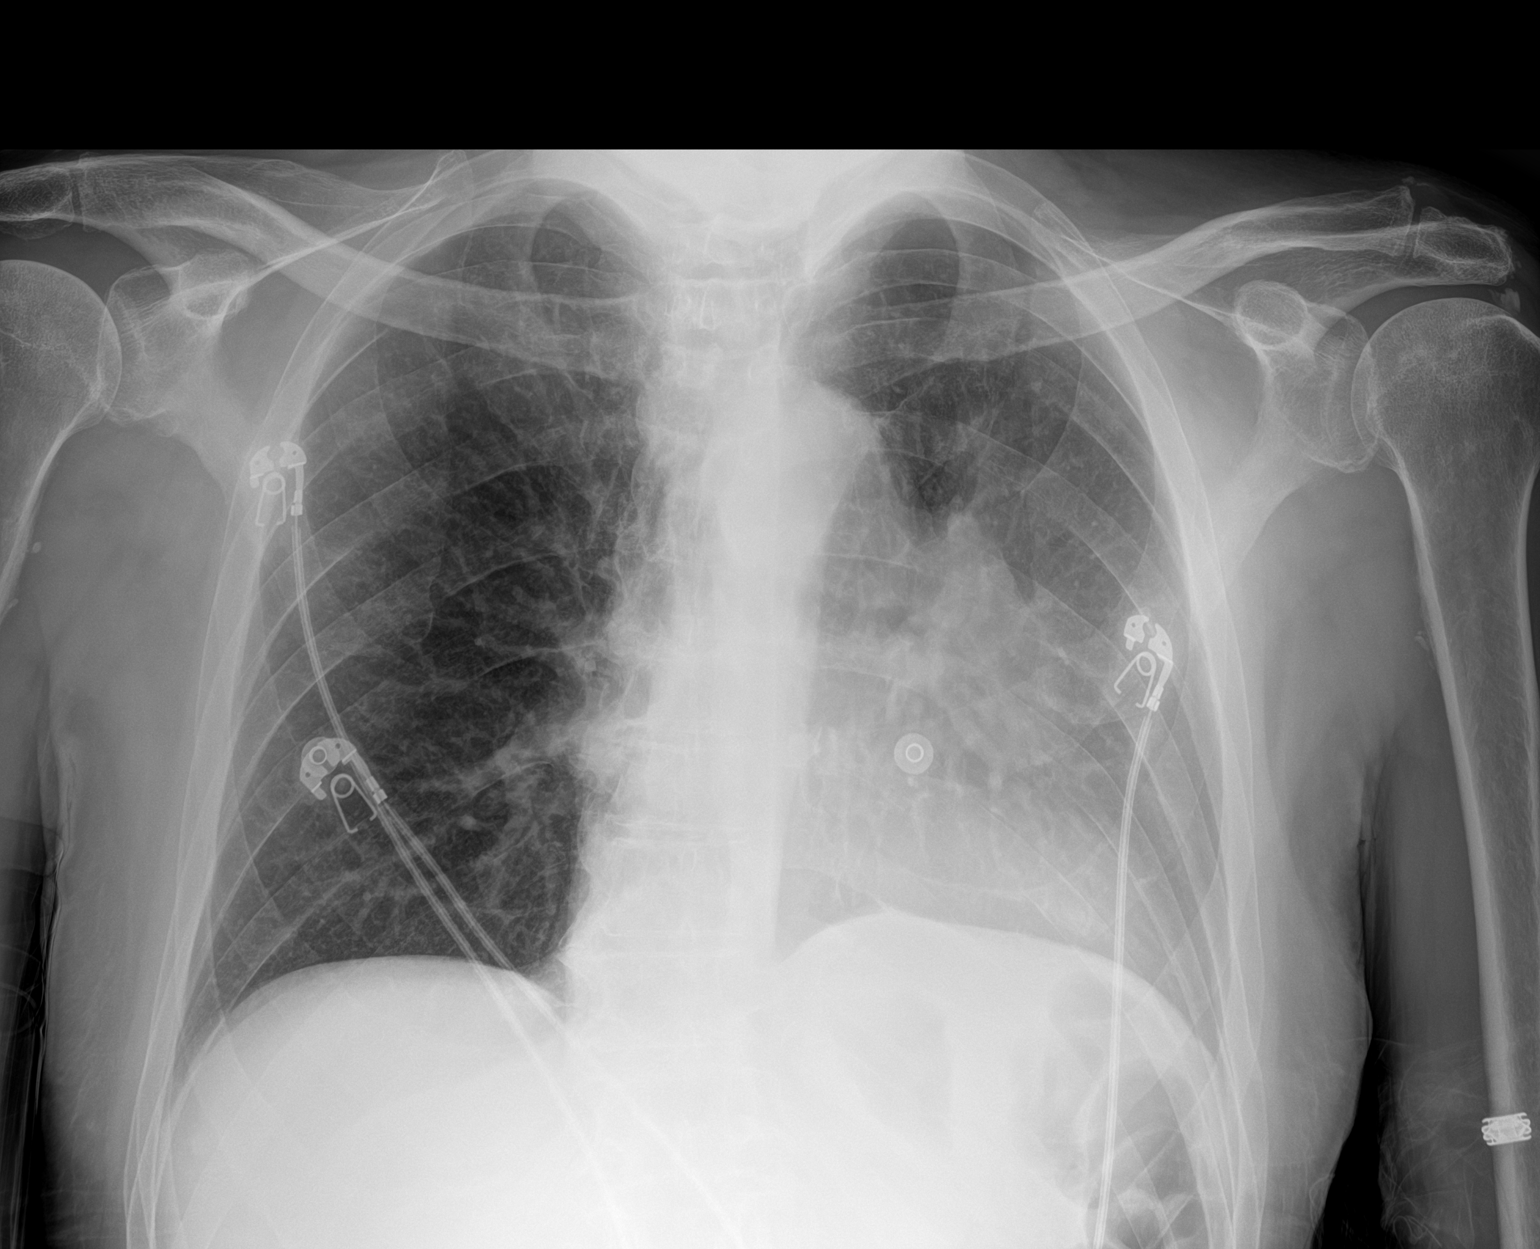

[1 of 1 positions shown; findings below may reference images not displayed]

FINDINGS: Right lung is grossly clear. Asymmetric hazy opacification in the
left thorax with increased left hilar density. Heart size upper
limits of normal. No pneumothorax.
IMPRESSION: Asymmetric hazy opacity in left thorax with asymmetric density in
the left hilus, could be due to diffuse lung parenchymal air disease
versus lung mass. Chest CT is recommended for further evaluation.

## 2019-10-16 IMAGING — CT CT HEAD W/O CM
4 series · 17 of 47 positions shown, 19 images · non-contrast
Comparison: [DATE], [DATE]

CLINICAL DATA: Altered level of consciousness, seizure

EXAM:
CT HEAD WITHOUT CONTRAST
TECHNIQUE: Contiguous axial images were obtained from the base of the skull
through the vertex without intravenous contrast.

[Series 3: head without · axial · non-contrast · 0.42mm/px · z∈[-111,+14]mm · 7 of 35 slices shown, 9 images]
[im 5/35  brain]
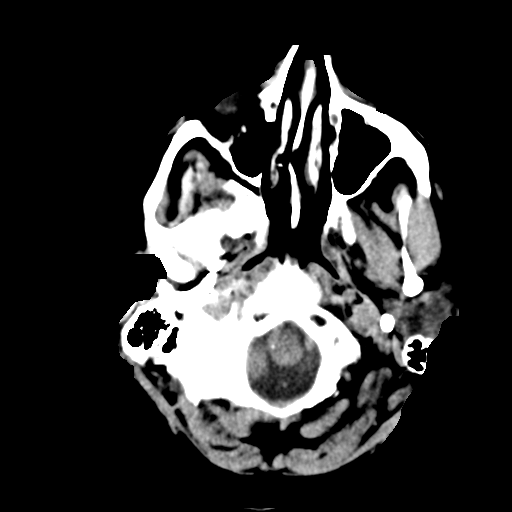
[im 5/35  bone]
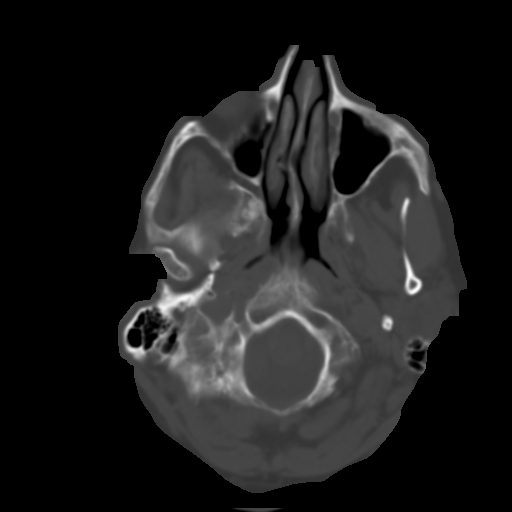
[im 9/35  brain]
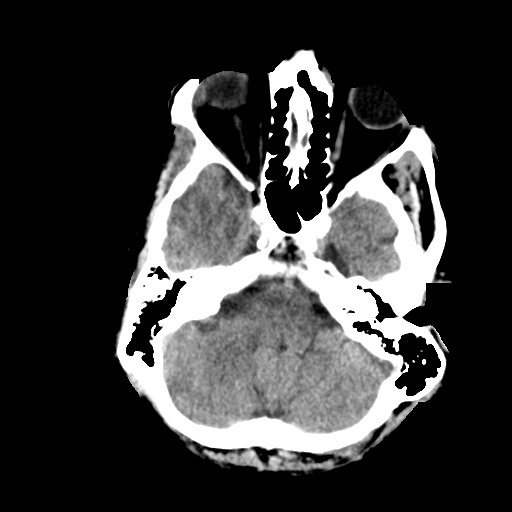
[im 13/35  brain]
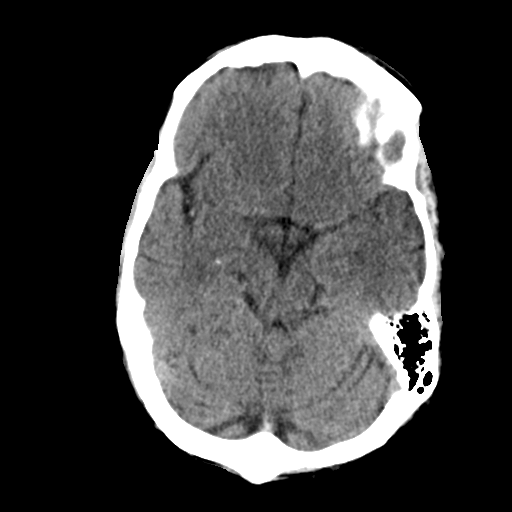
[im 18/35  brain]
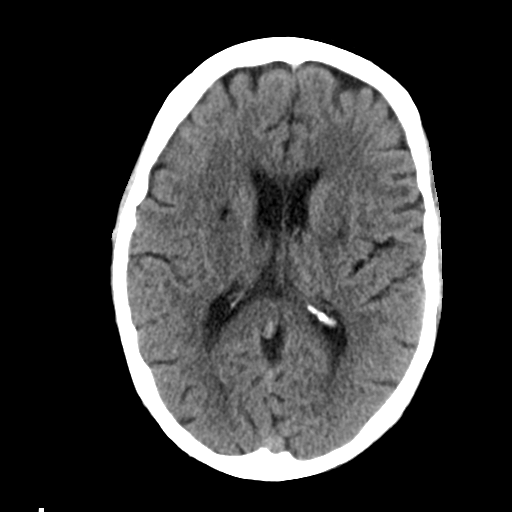
[im 22/35  brain]
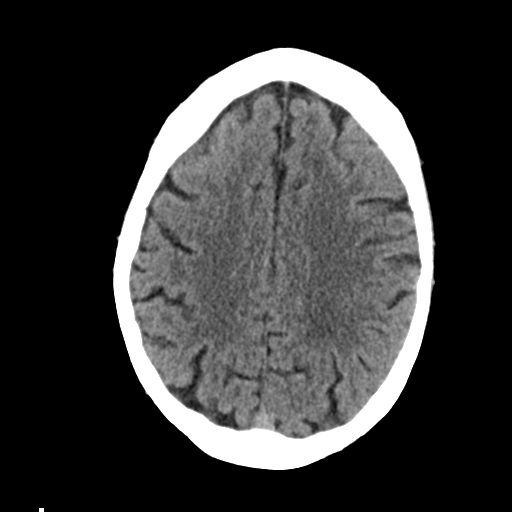
[im 22/35  bone]
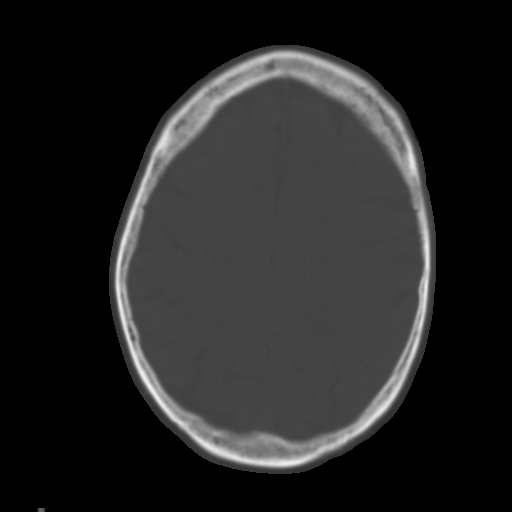
[im 26/35  brain]
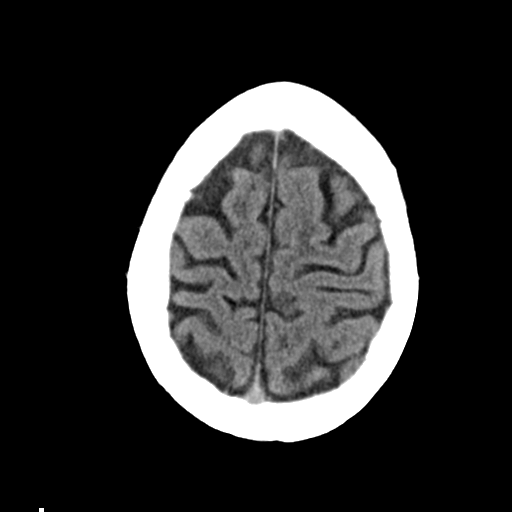
[im 30/35  brain]
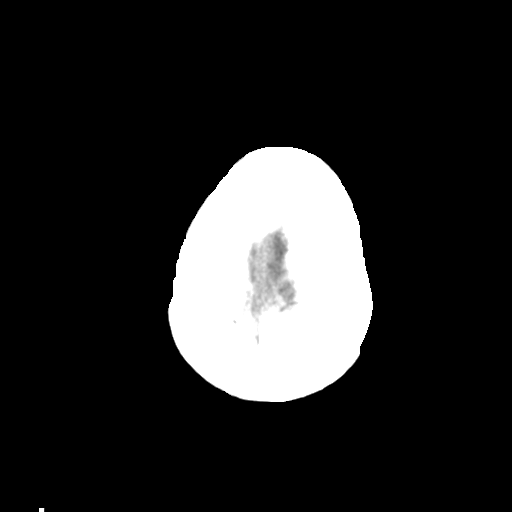

[Series 4: head bone · axial · 0.42mm/px · z∈[-115,-55]mm · 4 of 87 slices shown]
[im 9/87  bone]
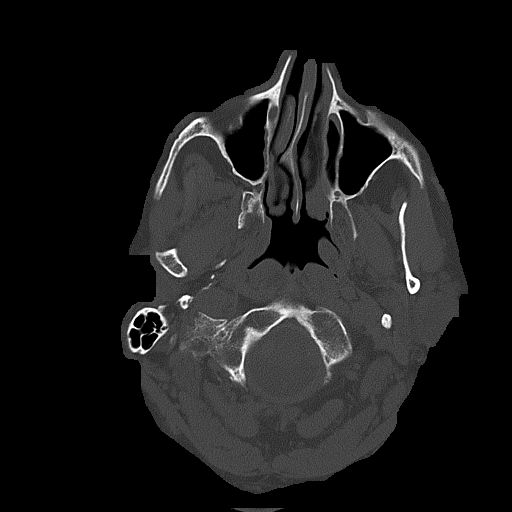
[im 18/87  bone]
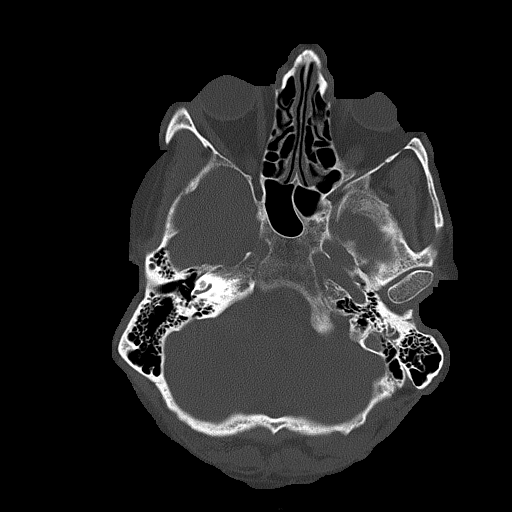
[im 26/87  bone]
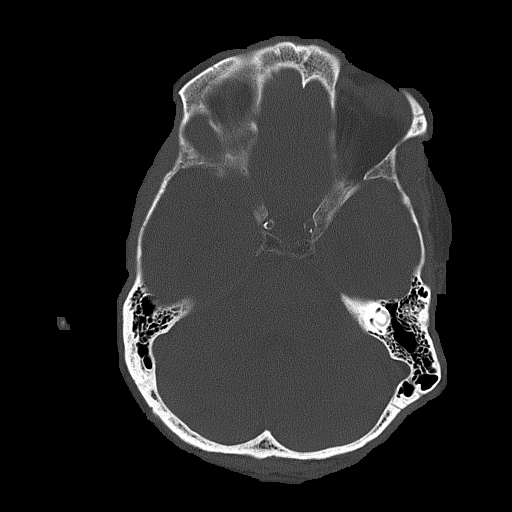
[im 39/87  bone]
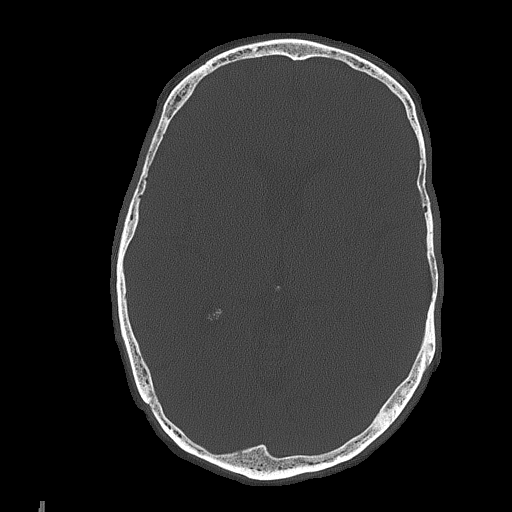

[Series 5: head without cor · coronal · non-contrast · 0.34mm/px · 3 of 72 slices shown]
[im 24/72  brain]
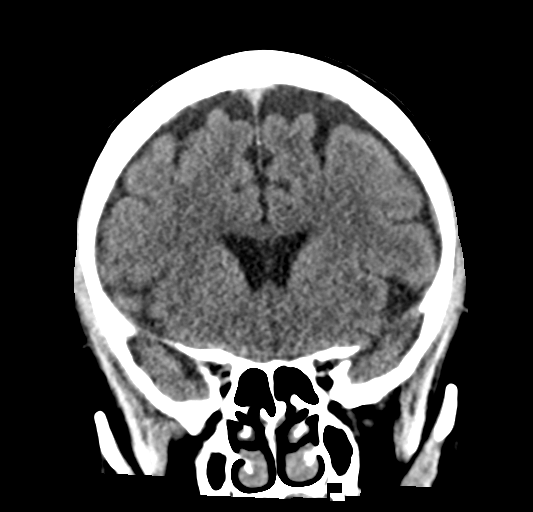
[im 32/72  brain]
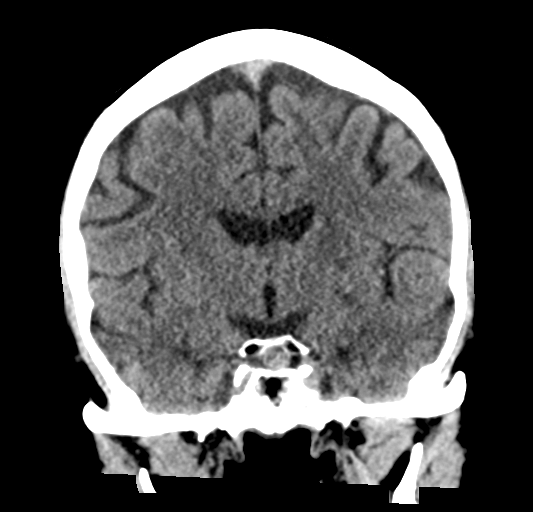
[im 40/72  brain]
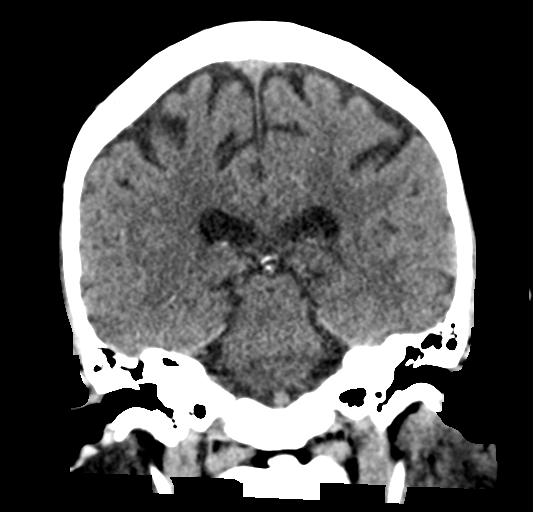

[Series 6: head without sag · sagittal · non-contrast · 0.34mm/px · 3 of 56 slices shown]
[im 19/56  brain]
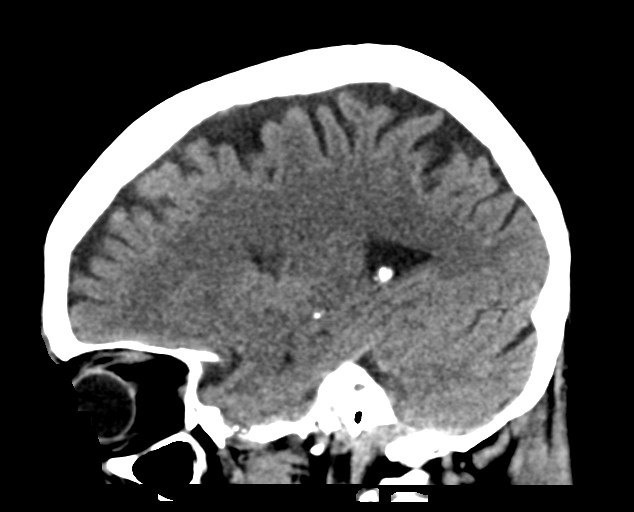
[im 28/56  brain]
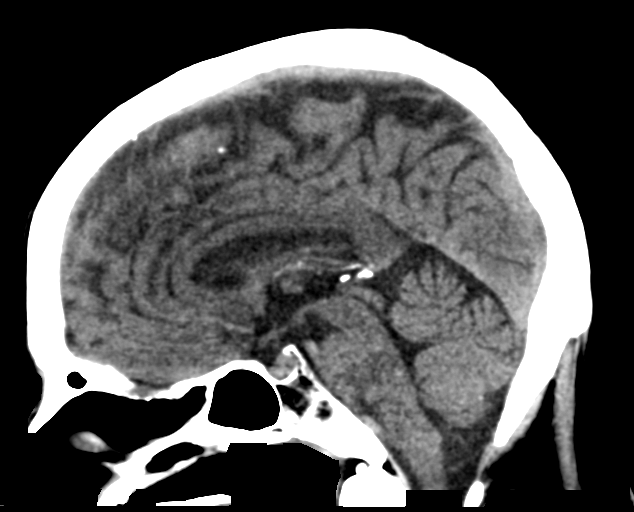
[im 37/56  brain]
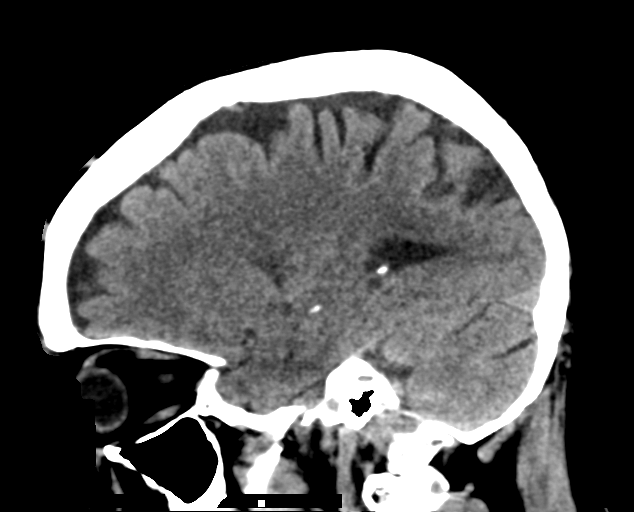

[17 of 47 positions shown; findings below may reference images not displayed]

FINDINGS: Brain: There are chronic small vessel ischemic changes seen within
the bilateral basal ganglia and periventricular white matter. No
acute infarct or hemorrhage. Lateral ventricles and remaining
midline structures are unremarkable. No acute extra-axial fluid
collections. No mass effect.

Vascular: No hyperdense vessel or unexpected calcification.

Skull: Normal. Negative for fracture or focal lesion.

Sinuses/Orbits: No acute finding.

Other: None
IMPRESSION: 1. Chronic small vessel ischemic changes, no acute intracranial
process.

## 2019-10-16 MED ORDER — SODIUM CHLORIDE 0.9 % IV BOLUS: 1000.0000 mL | Freq: Once | INTRAVENOUS | Status: DC

## 2019-10-16 MED ORDER — SODIUM CHLORIDE 0.9 % IV BOLUS
1000.0000 mL | Freq: Once | INTRAVENOUS | Status: AC
  Administered 2019-10-16: 23:00:00 1000 mL via INTRAVENOUS

## 2019-10-16 MED ORDER — POTASSIUM CHLORIDE 10 MEQ/100ML IV SOLN
10.0000 meq | INTRAVENOUS | Status: AC
  Administered 2019-10-16 – 2019-10-17 (×2): 10 meq via INTRAVENOUS
  Filled 2019-10-16 (×2): qty 100

## 2019-10-16 NOTE — Consult Note (Signed)
NEURO HOSPITALIST CONSULT NOTE   Requestig physician: Dr. Langston Masker  Reason for Consult: New onset seizure  History obtained from:  Chart     HPI:                                                                                                                                          Drew Nguyen is an 64 y.o. male with a history of stroke, EtOH abuse, cocaine abuse, smoking, COPD, HTN and RA presenting from home with new onset seizure. He has not been at baseline over the past few days in the context of discontinuing EtOH consumption about 1 week ago. Caretaker has noted AMS and gait instability. At baseline his speech is "a little slow". He recently started Plaquenil for his RA and prednisone dosage was decreased from 15 mg to 10 mg last week. The patient has no prior history of EtOH withdrawal syndrome per family and also has no prior history of seizure.   A description of the semiology of the seizure was obtained by Dr. Langston Masker, as documented in his note: "She reports that tonight, he "tightened up all over, clenched his arms, both arms rhythmically jerking," no screaming or noises.  The episode lasted approxiametly 1-1.5 minutes.  Afterwards he was snoring heavily and "out cold," not responding to pain.  No hx of seizures prior to this."  Past Medical History:  Diagnosis Date  . COPD (chronic obstructive pulmonary disease) ([name])   . Current smoker   . Depression   . Hypertension   . RA (rheumatoid arthritis) ([place])   . Tobacco abuse     Past Surgical History:  Procedure Laterality Date  . ANKLE SURGERY    . MULTIPLE TOOTH EXTRACTIONS      Family History  Problem Relation Age of Onset  . Hypertension Mother   . Heart attack Mother   . Heart attack Father   . Brain cancer Brother   . Colon cancer Maternal Aunt   . Stomach cancer Neg Hx   . Esophageal cancer Neg Hx   . Pancreatic cancer Neg Hx               Social History:  reports that he has been smoking  cigarettes. He has a 72.00 pack-year smoking history. He has never used smokeless tobacco. He reports current alcohol use. He reports current drug use. Drugs: Cocaine and Benzodiazepines.  Allergies  Allergen Reactions  . Celexa [Citalopram] Other (See Comments)    Over sedation Sleeps too much    HOME MEDICATIONS:  No current facility-administered medications on file prior to encounter.   Current Outpatient Medications on File Prior to Encounter  Medication Sig Dispense Refill  . acetaminophen (TYLENOL) 500 MG tablet Take 500 mg by mouth every 6 (six) hours as needed for mild pain, fever or headache.     Marland Kitchen amLODipine (NORVASC) 5 MG tablet Take 1 tablet (5 mg total) by mouth daily. 30 tablet 11  . cholecalciferol (VITAMIN D) 25 MCG tablet Take 1 tablet (1,000 Units total) by mouth daily. 30 tablet 0  . citalopram (CELEXA) 10 MG tablet Take 10 mg by mouth daily.    . clopidogrel (PLAVIX) 75 MG tablet Take 1 tablet (75 mg total) by mouth daily. 30 tablet 0  . COMBIVENT RESPIMAT 20-100 MCG/ACT AERS respimat Inhale 1 puff into the lungs 4 (four) times daily.    . DULoxetine (CYMBALTA) 30 MG capsule Take 30 mg by mouth daily.    . fluconazole (DIFLUCAN) 40 MG/ML suspension Take 40 mg by mouth daily.    . hydrochlorothiazide (MICROZIDE) 12.5 MG capsule Take 1 capsule (12.5 mg total) by mouth daily. (Patient not taking: Reported on 06/11/2019) 30 capsule 11  . meloxicam (MOBIC) 7.5 MG tablet Take 7.5 mg by mouth. 1 to 2 times daily    . naproxen sodium (ALEVE) 220 MG tablet Take 220 mg by mouth as needed.    . nystatin (MYCOSTATIN) 100000 UNIT/ML suspension Take 10 mLs by mouth 4 (four) times daily.    Marland Kitchen omeprazole (PRILOSEC) 20 MG capsule Take 1 capsule (20 mg total) by mouth daily. 30 capsule 0  . Potassium 99 MG TABS Take 99 mg by mouth daily.    . predniSONE (DELTASONE) 10 MG  tablet Take 5-10 mg by mouth See admin instructions. 10mg  in the morning and 5mg  in the evening    . rosuvastatin (CRESTOR) 5 MG tablet Take 5 mg by mouth every evening.    . SYMBICORT 160-4.5 MCG/ACT inhaler Inhale 2 puffs into the lungs 2 (two) times daily.       ROS:                                                                                                                                       As per HPI. The patient is a poor historian; more detailed ROS is unobtainable.    Blood pressure (!) 165/93, pulse 63, temperature 98.4 F (36.9 C), temperature source Oral, resp. rate (!) 33, height 5\' 6"  (1.676 m), weight 70 kg, SpO2 96 %.   General Examination:  Physical Exam  HEENT-  [place]/AT   Lungs- Respirations unlabored Extremities- No edema  Neurological Examination Mental Status: Awake with decreased attention. Appears confused. Partially oriented. Speech dysarthric and at times garbled and difficult to understand, but no clear receptive or expressive aphasia. There is a scanning/cerebellar quality to his speech. At one point he stutters, but the pattern is more consistent with behavioral or anxiety-related stuttering.  Cranial Nerves: II: Visual fields grossly normal with no extinction to DSS. PERRL.   III,IV, VI:  Has some difficulty tracking, but will gaze to left and right with intermittent pendular nystagmus noted in lateral and central gaze. No ptosis.  V,VII: Mild right facial weakness noted. Facial light touch sensation equal bilaterally VIII: Hearing intact to voice.  IX,X: Mild pharyngeal dysarthria XI: Head is midline XII: midline tongue extension Motor: Right : Upper extremity   5/5    Left:     Upper extremity   5/5  Lower extremity   5/5     Lower extremity   5/5 No pronator drift.  Sensory: Temp and light touch intact x 4. No extinction to DSS.  Deep Tendon  Reflexes: 2+ and symmetric BUE and patellae Plantars: Right: downgoing   Left: downgoing Cerebellar: No ataxia with FNF bilaterally  Gait: Deferred   Lab Results: Basic Metabolic Panel: Recent Labs  Lab 10/15/2019 2104 10/09/2019 2238  NA 122*  --   K 2.7*  --   CL 77*  --   CO2 31  --   GLUCOSE 143*  --   BUN 12  --   CREATININE 0.75  --   CALCIUM 8.5*  --   MG  --  1.7    CBC: Recent Labs  Lab 10/15/2019 2104  WBC 14.4*  HGB 14.8  HCT 39.8  MCV 86.5  PLT 345    Cardiac Enzymes: No results for input(s): CKTOTAL, CKMB, CKMBINDEX, TROPONINI in the last 168 hours.  Lipid Panel: No results for input(s): CHOL, TRIG, HDL, CHOLHDL, VLDL, LDLCALC in the last 168 hours.  Imaging: CT Head Wo Contrast  Result Date: 10/07/2019 CLINICAL DATA:  Altered level of consciousness, seizure EXAM: CT HEAD WITHOUT CONTRAST TECHNIQUE: Contiguous axial images were obtained from the base of the skull through the vertex without intravenous contrast. COMPARISON:  06/05/2019, 06/06/2019 FINDINGS: Brain: There are chronic small vessel ischemic changes seen within the bilateral basal ganglia and periventricular white matter. No acute infarct or hemorrhage. Lateral ventricles and remaining midline structures are unremarkable. No acute extra-axial fluid collections. No mass effect. Vascular: No hyperdense vessel or unexpected calcification. Skull: Normal. Negative for fracture or focal lesion. Sinuses/Orbits: No acute finding. Other: None IMPRESSION: 1. Chronic small vessel ischemic changes, no acute intracranial process. Electronically Signed   By: Randa Ngo M.D.   On: 10/07/2019 22:54   DG Chest Portable 1 View  Result Date: 10/14/2019 CLINICAL DATA:  Seizure, altered mental status EXAM: PORTABLE CHEST 1 VIEW COMPARISON:  06/06/2019 FINDINGS: Right lung is grossly clear. Asymmetric hazy opacification in the left thorax with increased left hilar density. Heart size upper limits of normal. No  pneumothorax. IMPRESSION: Asymmetric hazy opacity in left thorax with asymmetric density in the left hilus, could be due to diffuse lung parenchymal air disease versus lung mass. Chest CT is recommended for further evaluation. Electronically Signed   By: Donavan Foil M.D.   On: 09/23/2019 22:42    Assessment: 1. Exam reveals some confusion with dysarthria and nystagmus. There is a scanning/cerebellar quality to  his speech, suggestive of a cerebellar lesion or EtOH/benzodiazepine intoxication. Postictal confusion with dysarthria is also on the DDx.  2. CT head: Chronic small vessel ischemic changes, no acute intracranial process. Two chronic lacunar infarctions, on in each putamen, are also seen. No cortically based infarction is seen.  3. Patient per EDP interview quit EtOH about 1 week ago. Alcohol withdrawal seizure is therefore relatively high on the DDx.  4. Has some difficulty tracking, but will gaze to left and right with intermittent pendular nystagmus noted in lateral and central gaze. Acute thiamine deficiency is on the DDx.  5. Hyponatremia. Could have lowered his seizure threshold sufficiently to precipitate a seizure, although EtOH withdrawal is felt to be a more likely etiology.   Recommendations: 1. Scheduled Ativan 2 mg IV q6h x 4 doses, then 1 mg IV q6h x 8 doses, then PRN based on CIWA 2. MRI brain.  3. EEG in AM 4. Gradual correction of hyponatremia by no more than 0.5 meq/L per hour (or 12 meq/L per day).  5. STAT thiamine level. After this is drawn, start on high-dose thiamine at 500 mg IV TID x 3 days, then 100 mg po qd thereafter. Do not administer D5 until thiamine is started.  6. Tox screen 7. EtOH level.     Electronically signed: Dr. Kerney Elbe 09/26/2019, 11:51 PM

## 2019-10-16 NOTE — ED Provider Notes (Signed)
[facility] EMERGENCY DEPARTMENT Provider Note   CSN: [id number] Arrival date & time: 09/26/2019  2051     History Chief Complaint  Patient presents with  . Seizures    Drew Nguyen is a 64 y.o. male w/ hx of RA, CVA, daily etoh consumption  I spoke to [place] his caretaker at home, who reports that he is chronically "a little slow" talking and has a staggered gate since he had his most recent stroke.  Over the past week, however, she reports he has seemed more confused, off-base, and particularly confused at night.  Update * his wife and caretaker later reported that he stopped drinking alcohol 1 week ago.  He has been drinking "5 light beers" a night for "about 30 years."  They deny known hx of etoh withdrawal or seizures.  She reports that tonight, he "tightened up all over, clenched his arms, both arms rhythmically jerking," no screaming or noises.  The episode lasted approxiametly 1-1.5 minutes.  Afterwards he was snoring heavily and "out cold," not responding to pain.  No hx of seizures prior to this.  Prednisone 15 mg dropped to 10 mg last week Started on plaquenil this week  Also started on lasix and losartan this week  HPI     Past Medical History:  Diagnosis Date  . COPD (chronic obstructive pulmonary disease) ([place])   . Current smoker   . Depression   . Hypertension   . RA (rheumatoid arthritis) ([place])   . Tobacco abuse     Patient Active Problem List   Diagnosis Date Noted  . Seizure ([place]) 10/17/2019  . Acute ischemic stroke ([place]) 06/06/2019  . 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 Stroke ([place]) 06/06/2019  . CVA (cerebral vascular accident) ([place]) 04/19/2019  . Essential hypertension 04/19/2019  . Cocaine abuse ([place]) 04/19/2019  . Adjustment disorder 04/19/2019  . Dysphagia 10/25/2018  . Tobacco abuse 09/13/2018  . COPD GOLD I, still smoking 09/13/2018  . Rheumatoid arthritis ([place]) 09/13/2018    Past Surgical History:  Procedure  Laterality Date  . ANKLE SURGERY    . MULTIPLE TOOTH EXTRACTIONS         Family History  Problem Relation Age of Onset  . Hypertension Mother   . Heart attack Mother   . Heart attack Father   . Brain cancer Brother   . Colon cancer Maternal Aunt   . Stomach cancer Neg Hx   . Esophageal cancer Neg Hx   . Pancreatic cancer Neg Hx     Social History   Tobacco Use  . Smoking status: Current Every Day Smoker    Packs/day: 1.50    Years: 48.00    Pack years: 72.00    Types: Cigarettes  . Smokeless tobacco: Never Used  . Tobacco comment: 10/25/18 1 to 1 1/2 ppd  Substance Use Topics  . Alcohol use: Yes    Comment: occ  . Drug use: Yes    Types: Cocaine, Benzodiazepines    Home Medications Prior to Admission medications   Medication Sig Start Date End Date Taking? Authorizing Provider  acetaminophen (TYLENOL) 500 MG tablet Take 500 mg by mouth every 6 (six) hours as needed for mild pain, fever or headache.     [provider]  amLODipine (NORVASC) 5 MG tablet Take 1 tablet (5 mg total) by mouth daily. 04/20/19 04/19/20  DanfordSuann Larry, MD  cholecalciferol (VITAMIN D) 25 MCG tablet Take 1 tablet (1,000 Units total) by  mouth daily. 06/07/19 07/07/19  Terrilee Croak, MD  citalopram (CELEXA) 10 MG tablet Take 10 mg by mouth daily.    [provider]  clopidogrel (PLAVIX) 75 MG tablet Take 1 tablet (75 mg total) by mouth daily. 06/07/19 07/07/19  Dahal, Marlowe Aschoff, MD  COMBIVENT RESPIMAT 20-100 MCG/ACT AERS respimat Inhale 1 puff into the lungs 4 (four) times daily. 03/23/19   [provider]  DULoxetine (CYMBALTA) 30 MG capsule Take 30 mg by mouth daily.    [provider]  fluconazole (DIFLUCAN) 40 MG/ML suspension Take 40 mg by mouth daily.    [provider]  hydrochlorothiazide (MICROZIDE) 12.5 MG capsule Take 1 capsule (12.5 mg total) by mouth daily. Patient not taking: Reported on 06/11/2019 04/20/19 04/19/20  Edwin Dada,  MD  meloxicam (MOBIC) 7.5 MG tablet Take 7.5 mg by mouth. 1 to 2 times daily    [provider]  naproxen sodium (ALEVE) 220 MG tablet Take 220 mg by mouth as needed.    [provider]  nystatin (MYCOSTATIN) 100000 UNIT/ML suspension Take 10 mLs by mouth 4 (four) times daily. 06/09/19   [provider]  omeprazole (PRILOSEC) 20 MG capsule Take 1 capsule (20 mg total) by mouth daily. 06/06/19 07/06/19  Terrilee Croak, MD  Potassium 99 MG TABS Take 99 mg by mouth daily.    [provider]  predniSONE (DELTASONE) 10 MG tablet Take 5-10 mg by mouth See admin instructions. 10mg  in the morning and 5mg  in the evening 08/23/18   [provider]  rosuvastatin (CRESTOR) 5 MG tablet Take 5 mg by mouth every evening.    [provider]  SYMBICORT 160-4.5 MCG/ACT inhaler Inhale 2 puffs into the lungs 2 (two) times daily. 03/23/19   [provider]    Allergies    Celexa [citalopram]  Review of Systems   Review of Systems  Constitutional: Negative for chills and fever.  Respiratory: Negative for cough and shortness of breath.   Cardiovascular: Negative for chest pain and palpitations.  Gastrointestinal: Negative for abdominal pain and vomiting.  Musculoskeletal: Positive for arthralgias and myalgias.  Neurological: Negative for syncope and headaches.  All other systems reviewed and are negative.   Physical Exam Updated Vital Signs BP (!) 151/70   Pulse 61   Temp 98.4 F (36.9 C) (Oral)   Resp 11   Ht 5\' 6"  (1.676 m)   Wt 70 kg   SpO2 93%   BMI 24.91 kg/m   Physical Exam Vitals and nursing note reviewed.  Constitutional:      Appearance: He is well-developed.     Comments: Slow speech, seems dazed  HENT:     Head: Normocephalic and atraumatic.  Eyes:     Extraocular Movements: Extraocular movements intact.     Conjunctiva/sclera: Conjunctivae normal.     Pupils: Pupils are equal, round, and reactive to light.     Comments:  No photophobia  Cardiovascular:     Rate and Rhythm: Normal rate and regular rhythm.     Pulses: Normal pulses.  Pulmonary:     Effort: Pulmonary effort is normal. No respiratory distress.     Breath sounds: Normal breath sounds.  Abdominal:     Palpations: Abdomen is soft.     Tenderness: There is no abdominal tenderness.  Musculoskeletal:     Cervical back: Neck supple.  Skin:    General: Skin is warm and dry.  Neurological:     General: No focal deficit present.  Mental Status: He is alert and oriented to person, place, and time.     Comments: Moving all extremities, normal strength in upper and lower extremities     ED Results / Procedures / Treatments   Labs (all labs ordered are listed, but only abnormal results are displayed) Labs Reviewed  BASIC METABOLIC PANEL - Abnormal; Notable for the following components:      Result Value   Sodium 122 (*)    Potassium 2.7 (*)    Chloride 77 (*)    Glucose, Bld 143 (*)    Calcium 8.5 (*)    All other components within normal limits  CBC - Abnormal; Notable for the following components:   WBC 14.4 (*)    MCHC 37.2 (*)    All other components within normal limits  URINALYSIS, ROUTINE W REFLEX MICROSCOPIC - Abnormal; Notable for the following components:   Protein, ur 100 (*)    All other components within normal limits  CBG MONITORING, ED - Abnormal; Notable for the following components:   Glucose-Capillary 144 (*)    All other components within normal limits  SARS CORONAVIRUS 2 (TAT 6-24 HRS)  ETHANOL  RAPID URINE DRUG SCREEN, HOSP PERFORMED  MAGNESIUM  RPR  GC/CHLAMYDIA PROBE AMP ([place]) NOT AT [facility]    EKG None  Radiology CT Head Wo Contrast  Result Date: 09/24/2019 CLINICAL DATA:  Altered level of consciousness, seizure EXAM: CT HEAD WITHOUT CONTRAST TECHNIQUE: Contiguous axial images were obtained from the base of the skull through the vertex without intravenous contrast. COMPARISON:  06/05/2019,  06/06/2019 FINDINGS: Brain: There are chronic small vessel ischemic changes seen within the bilateral basal ganglia and periventricular white matter. No acute infarct or hemorrhage. Lateral ventricles and remaining midline structures are unremarkable. No acute extra-axial fluid collections. No mass effect. Vascular: No hyperdense vessel or unexpected calcification. Skull: Normal. Negative for fracture or focal lesion. Sinuses/Orbits: No acute finding. Other: None IMPRESSION: 1. Chronic small vessel ischemic changes, no acute intracranial process. Electronically Signed   By: Randa Ngo M.D.   On: 10/01/2019 22:54   DG Chest Portable 1 View  Result Date: 10/14/2019 CLINICAL DATA:  Seizure, altered mental status EXAM: PORTABLE CHEST 1 VIEW COMPARISON:  06/06/2019 FINDINGS: Right lung is grossly clear. Asymmetric hazy opacification in the left thorax with increased left hilar density. Heart size upper limits of normal. No pneumothorax. IMPRESSION: Asymmetric hazy opacity in left thorax with asymmetric density in the left hilus, could be due to diffuse lung parenchymal air disease versus lung mass. Chest CT is recommended for further evaluation. Electronically Signed   By: Donavan Foil M.D.   On: 10/15/2019 22:42    Procedures .Critical Care Performed by: Wyvonnia Dusky, MD Authorized by: Wyvonnia Dusky, MD   Critical care provider statement:    Critical care time (minutes):  35   Critical care was necessary to treat or prevent imminent or life-threatening deterioration of the following conditions:  Metabolic crisis   Critical care was time spent personally by me on the following activities:  Discussions with consultants, evaluation of patient's response to treatment, examination of patient, ordering and performing treatments and interventions, ordering and review of laboratory studies, ordering and review of radiographic studies, pulse oximetry, re-evaluation of patient's condition, obtaining  history from patient or surrogate and review of old charts Comments:     Hypokalemia and hyponatremia requiring IV repletion   (including critical care time)  Medications Ordered in ED Medications  potassium chloride  10 mEq in 100 mL IVPB (10 mEq Intravenous New Bag/Given 10/17/19 0006)  sodium chloride 0.9 % bolus 1,000 mL (0 mLs Intravenous Stopped 10/01/2019 2318)    ED Course  I have reviewed the triage vital signs and the nursing notes.  Pertinent labs & imaging results that were available during my care of the patient were reviewed by me and considered in my medical decision making (see chart for details).  64 yo male presenting to the ED with new onset seizure this evening.  Sounds like a first time tonic-clonic seizure lasting 1 minute, post-ictal afterwards.  Here he appears dazed, but it's not clear how far this is from his baseline.  His caretaker says he can "have a conversation, but he does forget words and things like that" since his stroke in Oct 2020.  Hyponatremic and hypokalemic here Low Na may be beer potomania with his daily drinking, not clear how acute this is His seizure may be provoked in the setting of recently drinking quitting... not clear exactly how many days he has gone without alcohol, but they estimated 7 days.  He is not tachycardic, tremulous, or hallucinating here, do not think he has evidence of DT at this time.  Likewise low suspicion for stroke with no acute findings on [facility] - this sounds more like a seizure  Low suspicion also for meningitis without fever, headache, photophobia, neck stiffness   Clinical Course as of Oct 16 16  Thu Oct 16, 2019  2352 Updated Jeani Hawking his caretaker, unable to reach [name] his wife by cell phone.  Also consulted Dr Cheral Marker of neurology and believes this may be provoked by hyponatremia (unclear etiology).  Will need hospital admission   [MT]  Fri Oct 17, 2019  0009 His wife shannon called and additionally requested a  Gc/Chlamydia test because she tested positive (for herpes simplex and GC) this week.  No positive syphilis.  Doubt herpes meningitis without fever, neck stiffness, photophobia.  Suspect this may be etoh related   [MT]  0013 Sign out given to hospitalist   [MT]    Clinical Course User Index [MT] Jessly Lebeck, Carola Rhine, MD    Final Clinical Impression(s) / ED Diagnoses Final diagnoses:  Seizure ([place])  Hyponatremia  Hypokalemia    Rx / DC Orders ED Discharge Orders    None       Erum Cercone, Carola Rhine, MD 10/17/19 [phone]

## 2019-10-16 NOTE — ED Notes (Signed)
CBG collected. Result "144." RN, [name], notified.

## 2019-10-16 NOTE — ED Triage Notes (Signed)
Patient arrived with EMS from home witnessed grand mal seizure approx. 1 min this evening , denies injury , alert and oriented at arrival , respirations unlabored . CBG= 137 .

## 2019-10-17 ENCOUNTER — Inpatient Hospital Stay (HOSPITAL_COMMUNITY): Payer: Medicaid Other

## 2019-10-17 ENCOUNTER — Other Ambulatory Visit (HOSPITAL_COMMUNITY): Payer: Self-pay

## 2019-10-17 ENCOUNTER — Encounter (HOSPITAL_COMMUNITY): Payer: Self-pay | Admitting: Internal Medicine

## 2019-10-17 DIAGNOSIS — C3490 Malignant neoplasm of unspecified part of unspecified bronchus or lung: Secondary | ICD-10-CM | POA: Diagnosis not present

## 2019-10-17 DIAGNOSIS — J9602 Acute respiratory failure with hypercapnia: Secondary | ICD-10-CM | POA: Diagnosis not present

## 2019-10-17 DIAGNOSIS — R933 Abnormal findings on diagnostic imaging of other parts of digestive tract: Secondary | ICD-10-CM | POA: Diagnosis not present

## 2019-10-17 DIAGNOSIS — J9601 Acute respiratory failure with hypoxia: Secondary | ICD-10-CM | POA: Diagnosis not present

## 2019-10-17 DIAGNOSIS — I69391 Dysphagia following cerebral infarction: Secondary | ICD-10-CM | POA: Diagnosis not present

## 2019-10-17 DIAGNOSIS — I6300 Cerebral infarction due to thrombosis of unspecified precerebral artery: Secondary | ICD-10-CM | POA: Diagnosis not present

## 2019-10-17 DIAGNOSIS — G988 Other disorders of nervous system: Secondary | ICD-10-CM | POA: Diagnosis not present

## 2019-10-17 DIAGNOSIS — R627 Adult failure to thrive: Secondary | ICD-10-CM | POA: Diagnosis not present

## 2019-10-17 DIAGNOSIS — J449 Chronic obstructive pulmonary disease, unspecified: Secondary | ICD-10-CM | POA: Diagnosis not present

## 2019-10-17 DIAGNOSIS — Z7189 Other specified counseling: Secondary | ICD-10-CM | POA: Diagnosis not present

## 2019-10-17 DIAGNOSIS — B370 Candidal stomatitis: Secondary | ICD-10-CM | POA: Diagnosis not present

## 2019-10-17 DIAGNOSIS — G9340 Encephalopathy, unspecified: Secondary | ICD-10-CM | POA: Diagnosis not present

## 2019-10-17 DIAGNOSIS — R590 Localized enlarged lymph nodes: Secondary | ICD-10-CM | POA: Diagnosis not present

## 2019-10-17 DIAGNOSIS — Z66 Do not resuscitate: Secondary | ICD-10-CM | POA: Diagnosis not present

## 2019-10-17 DIAGNOSIS — Z808 Family history of malignant neoplasm of other organs or systems: Secondary | ICD-10-CM | POA: Diagnosis not present

## 2019-10-17 DIAGNOSIS — C3492 Malignant neoplasm of unspecified part of left bronchus or lung: Secondary | ICD-10-CM | POA: Diagnosis not present

## 2019-10-17 DIAGNOSIS — C787 Secondary malignant neoplasm of liver and intrahepatic bile duct: Secondary | ICD-10-CM | POA: Diagnosis present

## 2019-10-17 DIAGNOSIS — D72829 Elevated white blood cell count, unspecified: Secondary | ICD-10-CM | POA: Diagnosis not present

## 2019-10-17 DIAGNOSIS — D61810 Antineoplastic chemotherapy induced pancytopenia: Secondary | ICD-10-CM | POA: Diagnosis not present

## 2019-10-17 DIAGNOSIS — R1310 Dysphagia, unspecified: Secondary | ICD-10-CM | POA: Diagnosis present

## 2019-10-17 DIAGNOSIS — E876 Hypokalemia: Secondary | ICD-10-CM | POA: Diagnosis not present

## 2019-10-17 DIAGNOSIS — Z8249 Family history of ischemic heart disease and other diseases of the circulatory system: Secondary | ICD-10-CM | POA: Diagnosis not present

## 2019-10-17 DIAGNOSIS — Z515 Encounter for palliative care: Secondary | ICD-10-CM | POA: Diagnosis not present

## 2019-10-17 DIAGNOSIS — D849 Immunodeficiency, unspecified: Secondary | ICD-10-CM | POA: Diagnosis not present

## 2019-10-17 DIAGNOSIS — R569 Unspecified convulsions: Secondary | ICD-10-CM | POA: Diagnosis present

## 2019-10-17 DIAGNOSIS — J440 Chronic obstructive pulmonary disease with acute lower respiratory infection: Secondary | ICD-10-CM | POA: Diagnosis present

## 2019-10-17 DIAGNOSIS — M069 Rheumatoid arthritis, unspecified: Secondary | ICD-10-CM

## 2019-10-17 DIAGNOSIS — J984 Other disorders of lung: Secondary | ICD-10-CM | POA: Diagnosis not present

## 2019-10-17 DIAGNOSIS — E871 Hypo-osmolality and hyponatremia: Secondary | ICD-10-CM | POA: Diagnosis present

## 2019-10-17 DIAGNOSIS — I10 Essential (primary) hypertension: Secondary | ICD-10-CM | POA: Diagnosis present

## 2019-10-17 DIAGNOSIS — C7951 Secondary malignant neoplasm of bone: Secondary | ICD-10-CM | POA: Diagnosis present

## 2019-10-17 DIAGNOSIS — Z20822 Contact with and (suspected) exposure to covid-19: Secondary | ICD-10-CM | POA: Diagnosis present

## 2019-10-17 DIAGNOSIS — R042 Hemoptysis: Secondary | ICD-10-CM | POA: Diagnosis not present

## 2019-10-17 DIAGNOSIS — J189 Pneumonia, unspecified organism: Secondary | ICD-10-CM | POA: Diagnosis present

## 2019-10-17 DIAGNOSIS — K7200 Acute and subacute hepatic failure without coma: Secondary | ICD-10-CM | POA: Diagnosis not present

## 2019-10-17 DIAGNOSIS — R52 Pain, unspecified: Secondary | ICD-10-CM | POA: Diagnosis not present

## 2019-10-17 DIAGNOSIS — F10239 Alcohol dependence with withdrawal, unspecified: Secondary | ICD-10-CM | POA: Diagnosis present

## 2019-10-17 DIAGNOSIS — C3412 Malignant neoplasm of upper lobe, left bronchus or lung: Secondary | ICD-10-CM | POA: Diagnosis present

## 2019-10-17 DIAGNOSIS — G92 Toxic encephalopathy: Secondary | ICD-10-CM | POA: Diagnosis present

## 2019-10-17 DIAGNOSIS — C801 Malignant (primary) neoplasm, unspecified: Secondary | ICD-10-CM | POA: Diagnosis not present

## 2019-10-17 DIAGNOSIS — G9341 Metabolic encephalopathy: Secondary | ICD-10-CM | POA: Diagnosis not present

## 2019-10-17 DIAGNOSIS — E874 Mixed disorder of acid-base balance: Secondary | ICD-10-CM | POA: Diagnosis not present

## 2019-10-17 DIAGNOSIS — R918 Other nonspecific abnormal finding of lung field: Secondary | ICD-10-CM | POA: Diagnosis not present

## 2019-10-17 LAB — BASIC METABOLIC PANEL
Anion gap: 11 (ref 5–15)
Anion gap: 12 (ref 5–15)
Anion gap: 9 (ref 5–15)
Anion gap: 9 (ref 5–15)
BUN: 10 mg/dL (ref 8–23)
BUN: 11 mg/dL (ref 8–23)
BUN: 11 mg/dL (ref 8–23)
BUN: 12 mg/dL (ref 8–23)
CO2: 32 mmol/L (ref 22–32)
CO2: 31 mmol/L (ref 22–32)
CO2: 31 mmol/L (ref 22–32)
CO2: 29 mmol/L (ref 22–32)
Calcium: 7.8 mg/dL — ABNORMAL LOW (ref 8.9–10.3)
Calcium: 8 mg/dL — ABNORMAL LOW (ref 8.9–10.3)
Calcium: 7.9 mg/dL — ABNORMAL LOW (ref 8.9–10.3)
Calcium: 7.8 mg/dL — ABNORMAL LOW (ref 8.9–10.3)
Chloride: 83 mmol/L — ABNORMAL LOW (ref 98–111)
Chloride: 84 mmol/L — ABNORMAL LOW (ref 98–111)
Chloride: 90 mmol/L — ABNORMAL LOW (ref 98–111)
Chloride: 94 mmol/L — ABNORMAL LOW (ref 98–111)
Creatinine, Ser: 0.67 mg/dL (ref 0.61–1.24)
Creatinine, Ser: 0.7 mg/dL (ref 0.61–1.24)
Creatinine, Ser: 0.61 mg/dL (ref 0.61–1.24)
Creatinine, Ser: 0.65 mg/dL (ref 0.61–1.24)
GFR calc Af Amer: >60 mL/min (ref >60)
GFR calc Af Amer: >60 mL/min (ref >60)
GFR calc Af Amer: >60 mL/min (ref >60)
GFR calc Af Amer: >60 mL/min (ref >60)
GFR calc non Af Amer: >60 mL/min (ref >60)
GFR calc non Af Amer: >60 mL/min (ref >60)
GFR calc non Af Amer: >60 mL/min (ref >60)
GFR calc non Af Amer: >60 mL/min (ref >60)
Glucose, Bld: 158 mg/dL — ABNORMAL HIGH (ref 70–99)
Glucose, Bld: 175 mg/dL — ABNORMAL HIGH (ref 70–99)
Glucose, Bld: 153 mg/dL — ABNORMAL HIGH (ref 70–99)
Glucose, Bld: 133 mg/dL — ABNORMAL HIGH (ref 70–99)
Potassium: 2.4 mmol/L — CL (ref 3.5–5.1)
Potassium: 2.7 mmol/L — CL (ref 3.5–5.1)
Potassium: 3.5 mmol/L (ref 3.5–5.1)
Potassium: 3.1 mmol/L — ABNORMAL LOW (ref 3.5–5.1)
Sodium: 126 mmol/L — ABNORMAL LOW (ref 135–145)
Sodium: 127 mmol/L — ABNORMAL LOW (ref 135–145)
Sodium: 130 mmol/L — ABNORMAL LOW (ref 135–145)
Sodium: 132 mmol/L — ABNORMAL LOW (ref 135–145)

## 2019-10-17 LAB — CREATININE, SERUM
Creatinine, Ser: 0.68 mg/dL (ref 0.61–1.24)
GFR calc Af Amer: >60 mL/min (ref >60)
GFR calc non Af Amer: >60 mL/min (ref >60)

## 2019-10-17 LAB — CBC
HCT: 34.4 % — ABNORMAL LOW (ref 39.0–52.0)
Hemoglobin: 12.7 g/dL — ABNORMAL LOW (ref 13.0–17.0)
MCH: 32.2 pg (ref 26.0–34.0)
MCHC: 36.9 g/dL — ABNORMAL HIGH (ref 30.0–36.0)
MCV: 87.3 fL (ref 80.0–100.0)
Platelets: 285 10*3/uL (ref 150–400)
RBC: 3.94 MIL/uL — ABNORMAL LOW (ref 4.22–5.81)
RDW: 11.9 % (ref 11.5–15.5)
WBC: 13 10*3/uL — ABNORMAL HIGH (ref 4.0–10.5)
nRBC: 0 % (ref 0.0–0.2)

## 2019-10-17 LAB — TSH: TSH: 0.385 u[IU]/mL (ref 0.350–4.500)

## 2019-10-17 LAB — PROCALCITONIN: Procalcitonin: 0.86 ng/mL

## 2019-10-17 LAB — RPR: RPR Ser Ql: NONREACTIVE

## 2019-10-17 LAB — SARS CORONAVIRUS 2 (TAT 6-24 HRS): SARS Coronavirus 2: NEGATIVE

## 2019-10-17 LAB — SODIUM, URINE, RANDOM: Sodium, Ur: 37 mmol/L

## 2019-10-17 LAB — OSMOLALITY, URINE: Osmolality, Ur: 238 mOsm/kg — ABNORMAL LOW (ref 300–900)

## 2019-10-17 IMAGING — MR MR HEAD WO/W CM
16 of 17 series · 44 of 48 positions shown · IV contrast (Gadavist)
Comparison: CT head without contrast [DATE]. MRI of the head
[DATE]

CLINICAL DATA: Seizure. Abnormal neuro exam. Hypertension. Patient
stopped drinking daily alcohol 1 week ago. Confusion.

EXAM:
MRI HEAD WITHOUT AND WITH CONTRAST
TECHNIQUE: Multiplanar, multiecho pulse sequences of the brain and surrounding
structures were obtained without and with intravenous contrast.
CONTRAST:  7mL GADAVIST GADOBUTROL 1 MMOL/ML IV SOLN

[Series 5: DWI · axial · 3.0mm · 0.88mm/px · z∈[-46,+88]mm · 5 of 98 slices shown (1 of 4)]
[im 1/98]
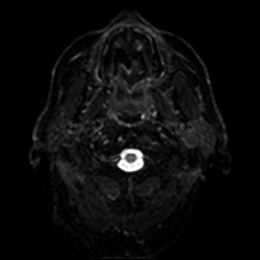
[im 25/98]
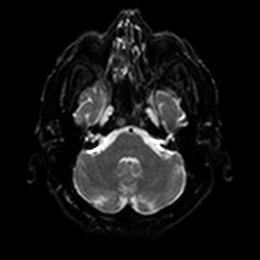
[im 49/98]
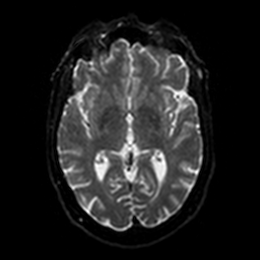
[im 73/98]
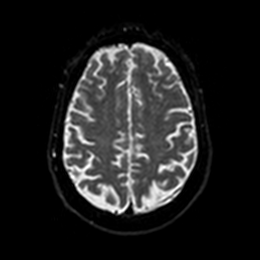
[im 98/98]
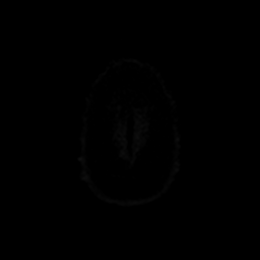

[Series 6: DWI · axial · 3.0mm · 0.88mm/px · z∈[-46,+88]mm · 3 of 48 slices shown (2 of 4)]
[im 1/48]
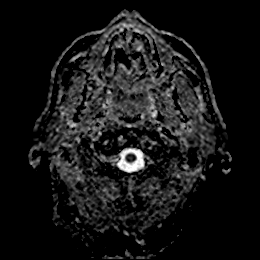
[im 24/48]
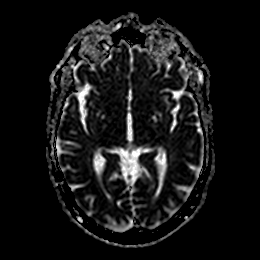
[im 48/48]
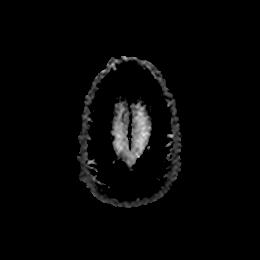

[Series 7: DWI · coronal · 4.0mm · 0.88mm/px · 4 of 70 slices shown (3 of 4)]
[im 1/70]
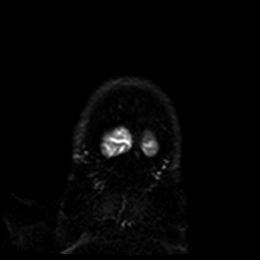
[im 24/70]
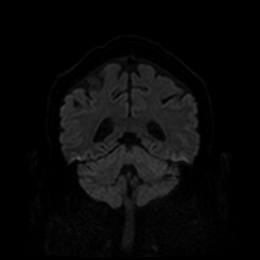
[im 47/70]
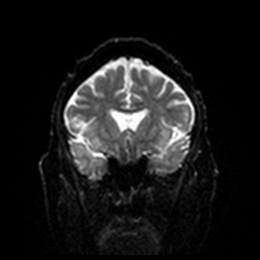
[im 70/70]
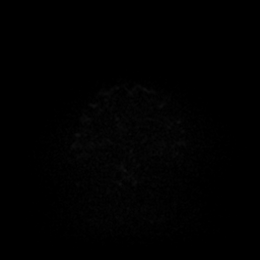

[Series 8: DWI · coronal · 4.0mm · 0.88mm/px · 2 of 35 slices shown (4 of 4)]
[im 1/35]
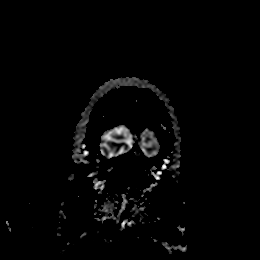
[im 35/35]
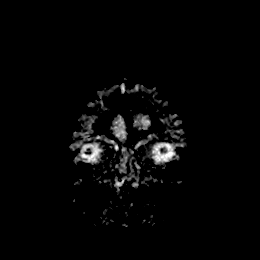

[Series 9: T1 · sagittal · 5.0mm · 0.75mm/px · 1 of 23 slices shown]
[im 1/23]
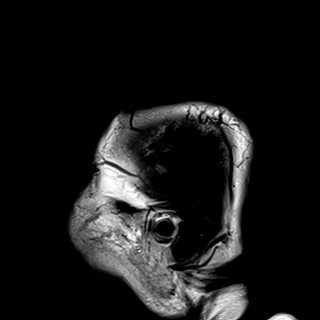

[Series 10: T2 · axial · 5.0mm · 0.72mm/px · z∈[-46,+88]mm · 2 of 25 slices shown (1 of 2)]
[im 1/25]
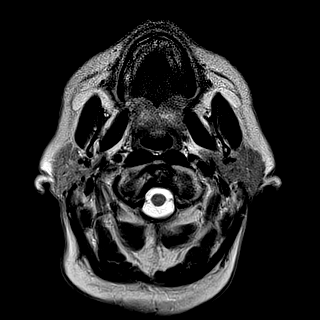
[im 25/25]
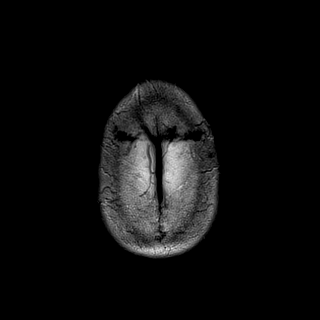

[Series 11: FLAIR · axial · 5.0mm · 0.45mm/px · z∈[-46,+88]mm · 2 of 25 slices shown (1 of 3)]
[im 1/25]
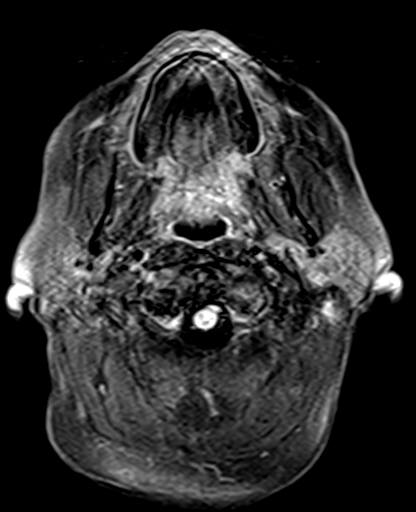
[im 25/25]
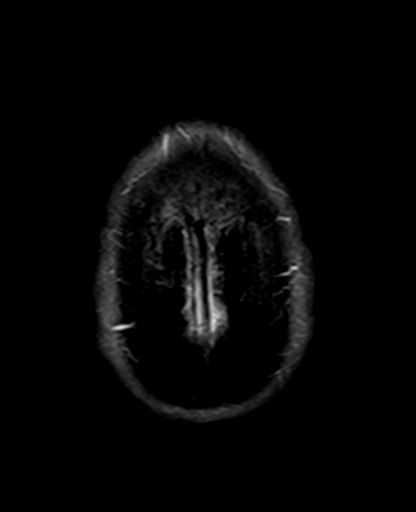

[Series 12: mag_images · axial · 3.0mm · 0.90mm/px · z∈[-56,+109]mm · 4 of 60 slices shown]
[im 1/60]
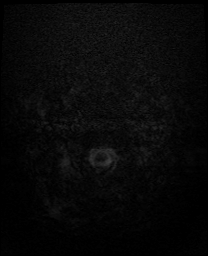
[im 20/60]
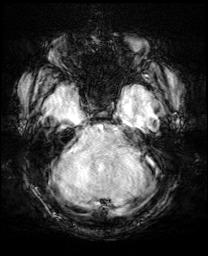
[im 40/60]
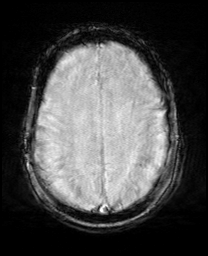
[im 60/60]
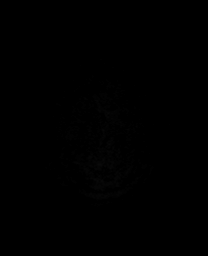

[Series 13: pha_images · axial · 3.0mm · 0.90mm/px · z∈[-56,+109]mm · 4 of 60 slices shown]
[im 1/60]
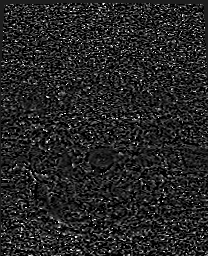
[im 20/60]
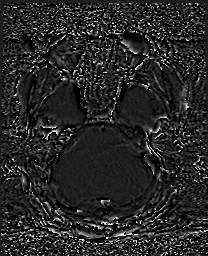
[im 40/60]
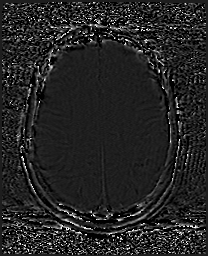
[im 60/60]
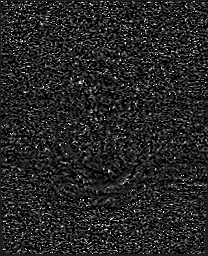

[Series 14: swi_images · axial · 3.0mm · 0.90mm/px · z∈[-56,+109]mm · 4 of 60 slices shown]
[im 1/60]
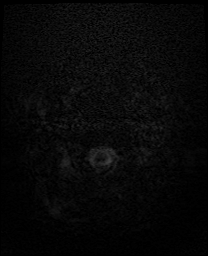
[im 20/60]
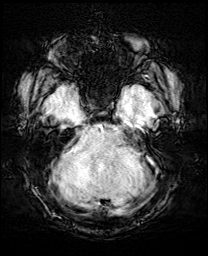
[im 40/60]
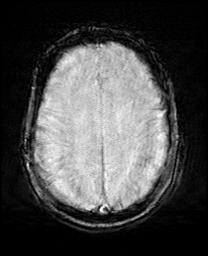
[im 60/60]
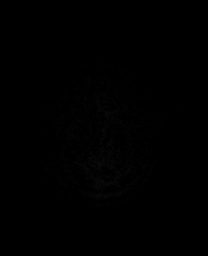

[Series 15: mip_images(sw) · axial · 24.0mm · 0.90mm/px · z∈[-46,+99]mm · 3 of 53 slices shown]
[im 1/53]
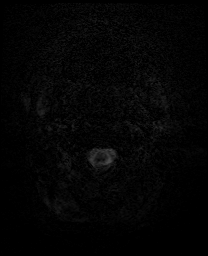
[im 27/53]
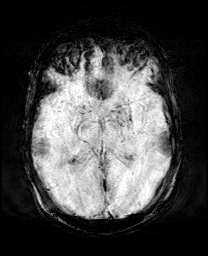
[im 53/53]
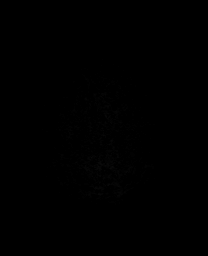

[Series 16: FLAIR · axial · 5.0mm · 0.90mm/px · z∈[-48,+86]mm · 2 of 25 slices shown (2 of 3)]
[im 1/25]
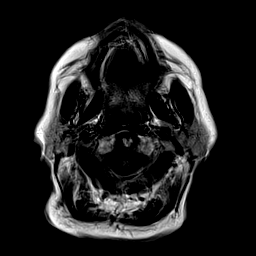
[im 25/25]
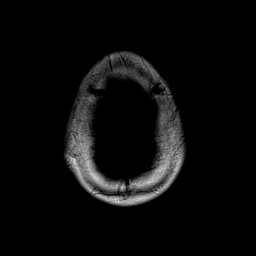

[Series 17: T2 · coronal · 3.0mm · 0.27mm/px · 2 of 30 slices shown (2 of 2)]
[im 1/30]
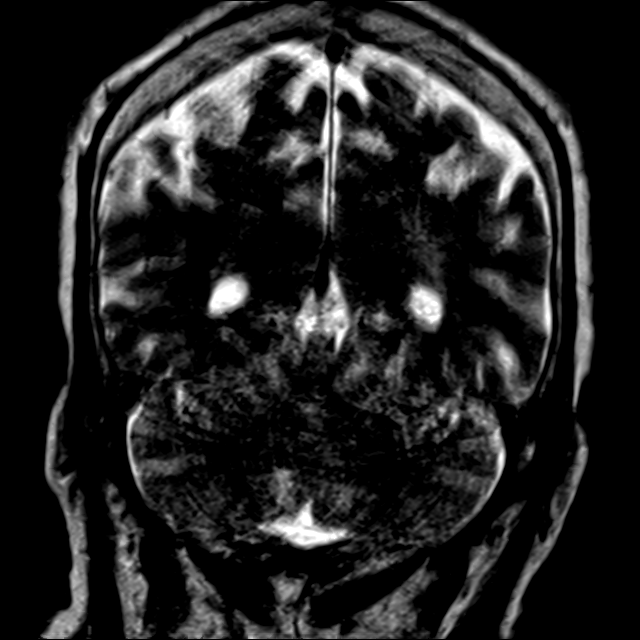
[im 30/30]
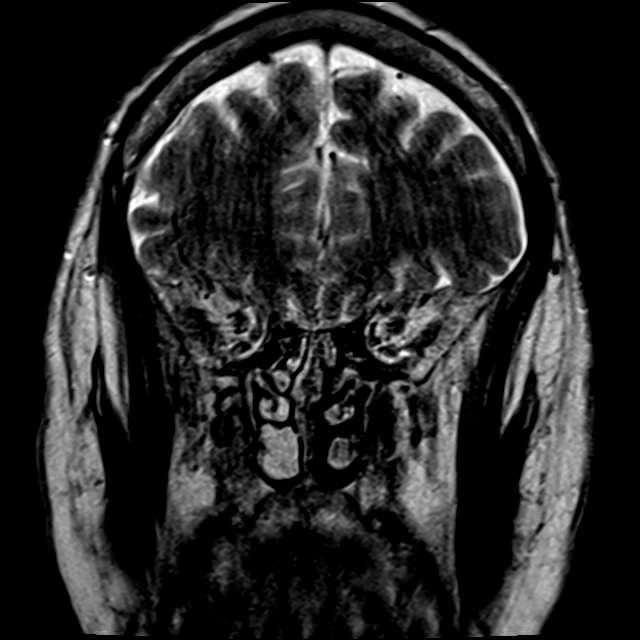

[Series 18: T2 post-contrast · coronal · 5.0mm · 0.72mm/px · 2 of 30 slices shown]
[im 1/30]
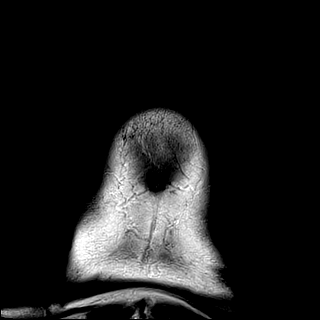
[im 30/30]
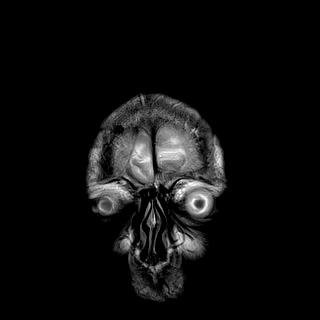

[Series 20: T1 post-contrast · coronal · 5.0mm · 0.34mm/px · 2 of 30 slices shown]
[im 1/30]
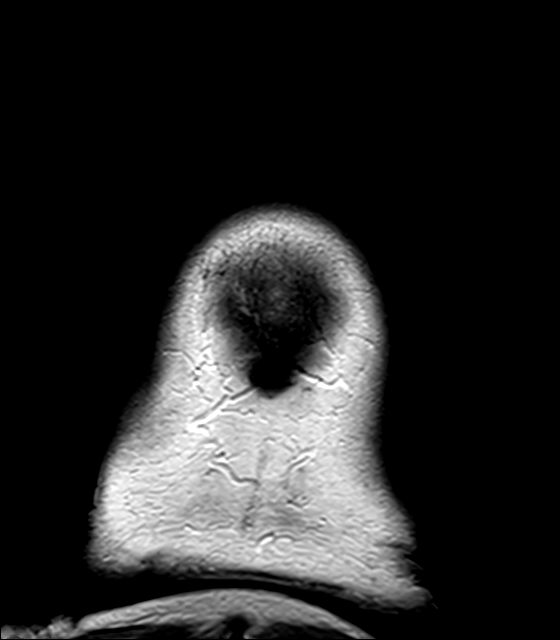
[im 30/30]
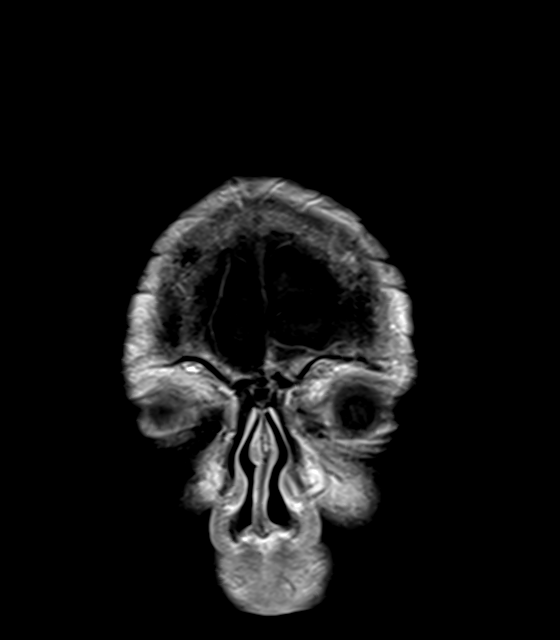

[Series 21: FLAIR · axial · 5.0mm · 0.90mm/px · z∈[-48,+86]mm · 2 of 25 slices shown (3 of 3)]
[im 1/25]
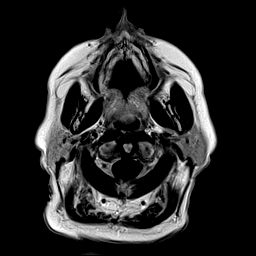
[im 25/25]
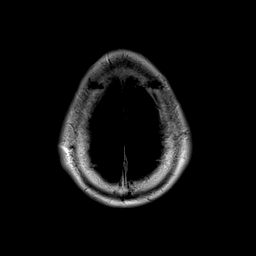

[44 of 48 positions shown; findings below may reference images not displayed]

FINDINGS: Brain: The diffusion-weighted images demonstrate no acute or
subacute infarct. Remote lacunar infarcts are present within the
basal ganglia extending superiorly into the corona radiata
bilaterally. Mild atrophy and white matter disease is present
bilaterally, otherwise stable. The basal ganglia are otherwise
unremarkable. Insular ribbon is within limits bilaterally. Study is
mildly degraded by patient motion. Temporal lobes are normal. The
internal auditory canals are normal bilaterally. The brainstem and
cerebellum are normal.

Postcontrast images demonstrate no pathologic enhancement.

Vascular: Flow is present in the major intracranial arteries.

Skull and upper cervical spine: The craniocervical junction is
normal. Upper cervical spine is within normal limits. Marrow signal
is unremarkable.

Sinuses/Orbits: The paranasal sinuses and mastoid air cells are
clear. The globes and orbits are within normal limits.
IMPRESSION: 1. Remote nonhemorrhagic lacunar infarcts of the basal ganglia
bilaterally extending into the corona radiata.
2. Atrophy and white matter disease are mildly advanced for age.
3. No acute or focal intracranial abnormality to explain seizures or
confusion.

## 2019-10-17 IMAGING — CT CT CHEST W/ CM
2 of 4 series · 14 of 36 positions shown, 17 images · IV contrast (APPLIED)
Comparison: [DATE], [DATE]

CLINICAL DATA: Abnormal chest x-ray, pulmonary nodule, seizure,
altered level of consciousness

EXAM:
CT CHEST WITH CONTRAST
TECHNIQUE: Multidetector CT imaging of the chest was performed during
intravenous contrast administration.
CONTRAST:  75mL OMNIPAQUE IOHEXOL 300 MG/ML  SOLN

[Series 3: chest w · axial · 0.67mm/px · z∈[-311,-59]mm · 11 of 150 slices shown, 14 images]
[im 12/150  mediastinal]
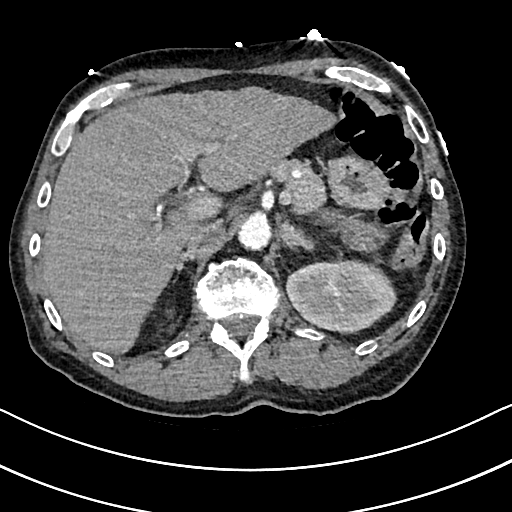
[im 12/150  lung]
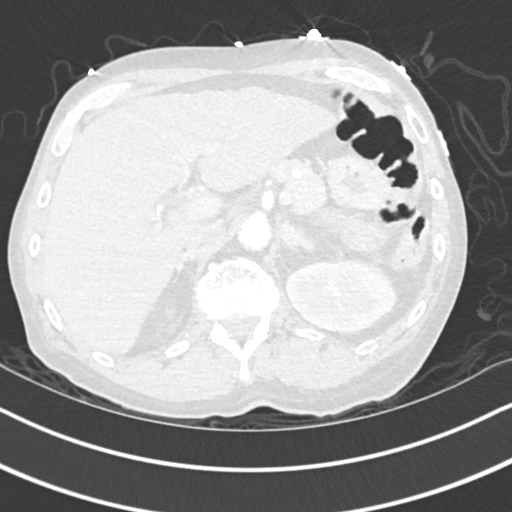
[im 23/150  lung]
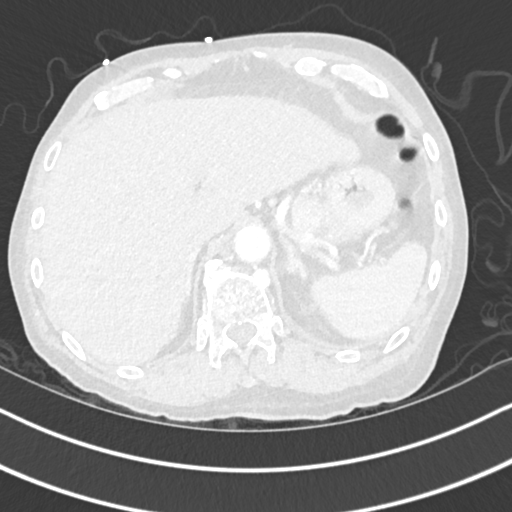
[im 35/150  lung]
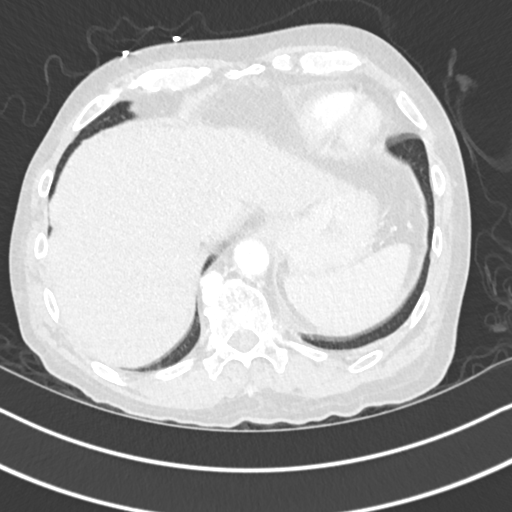
[im 46/150  lung]
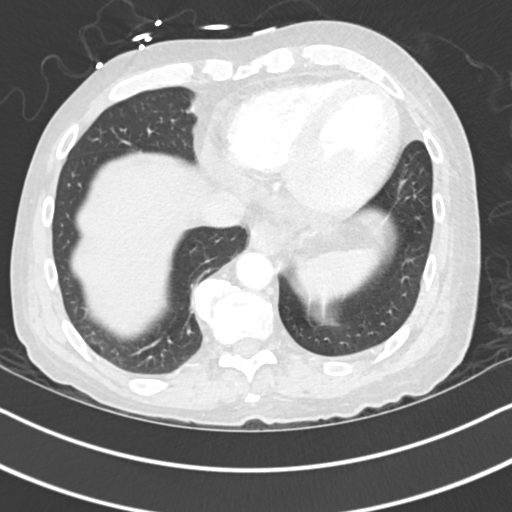
[im 58/150  mediastinal]
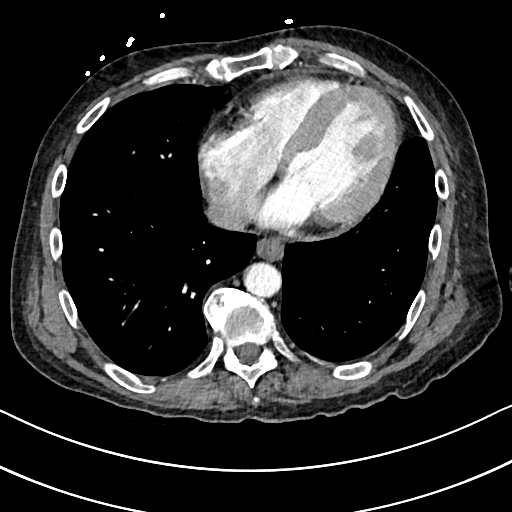
[im 58/150  lung]
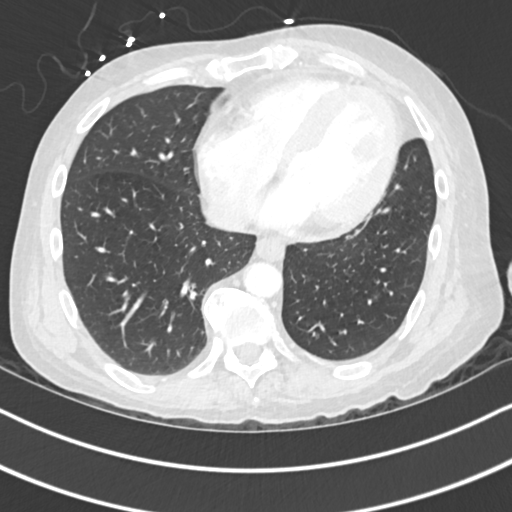
[im 81/150  lung]
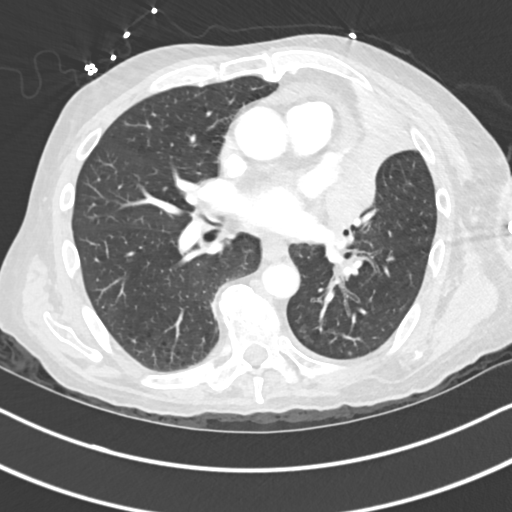
[im 92/150  lung]
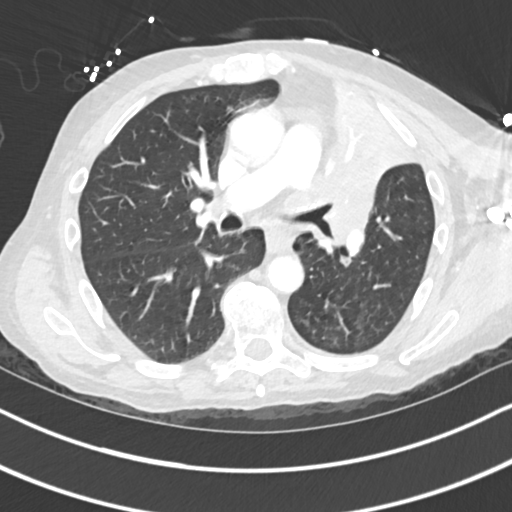
[im 104/150  lung]
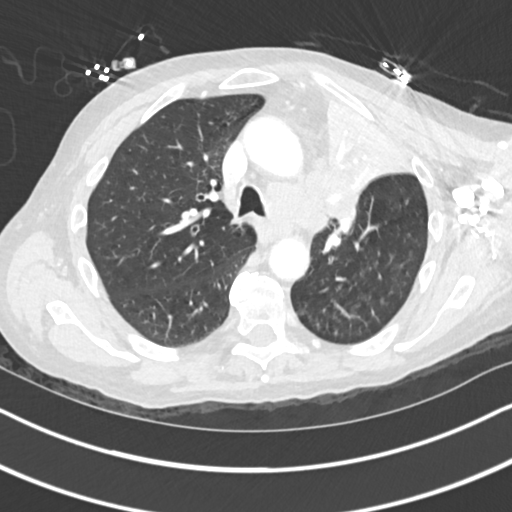
[im 115/150  mediastinal]
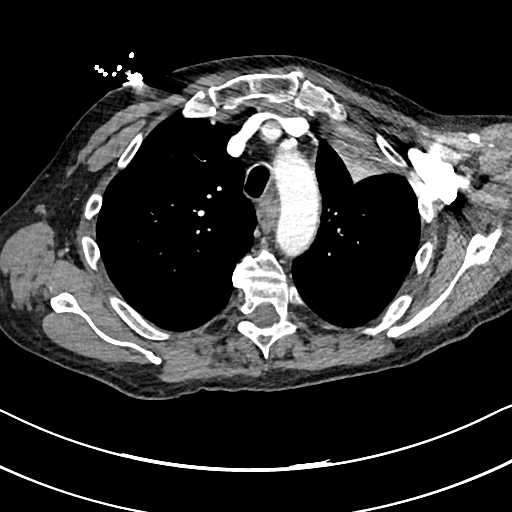
[im 115/150  lung]
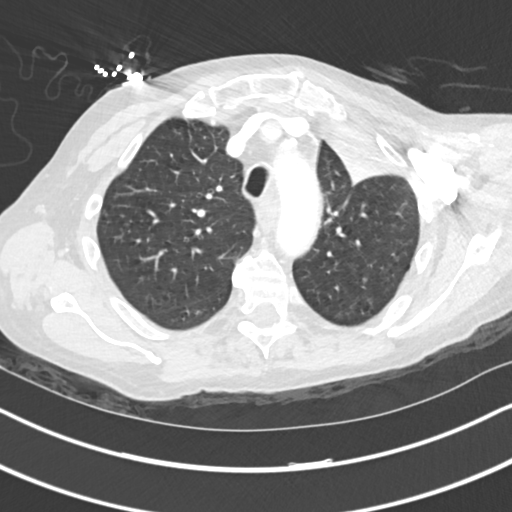
[im 127/150  lung]
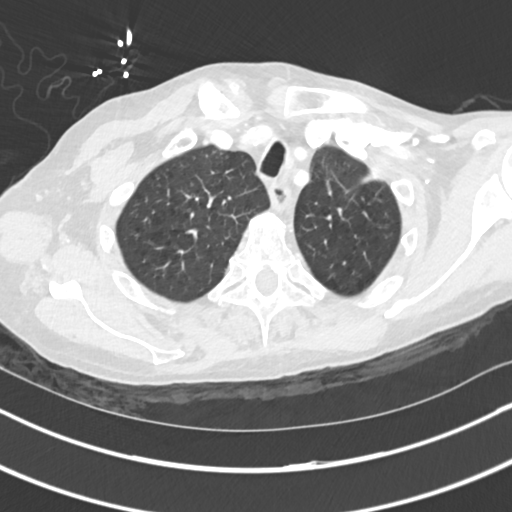
[im 138/150  lung]
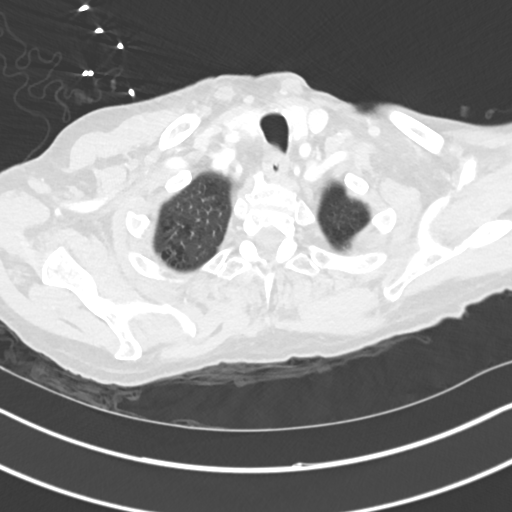

[Series 6: cor · coronal · 0.64mm/px · 3 of 158 slices shown]
[im 32/158  lung]
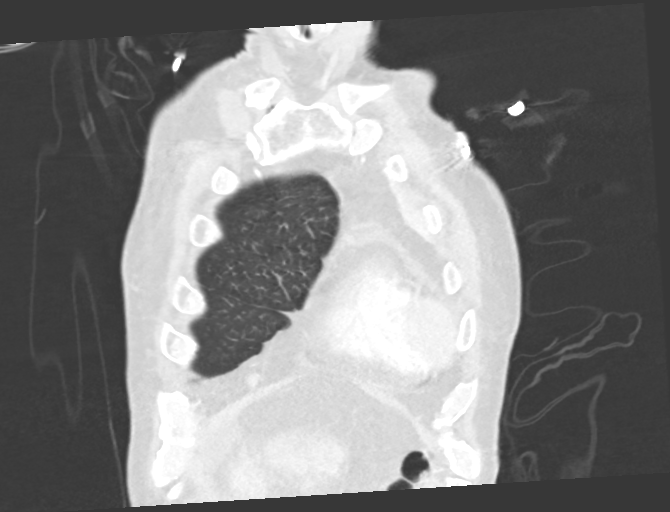
[im 63/158  lung]
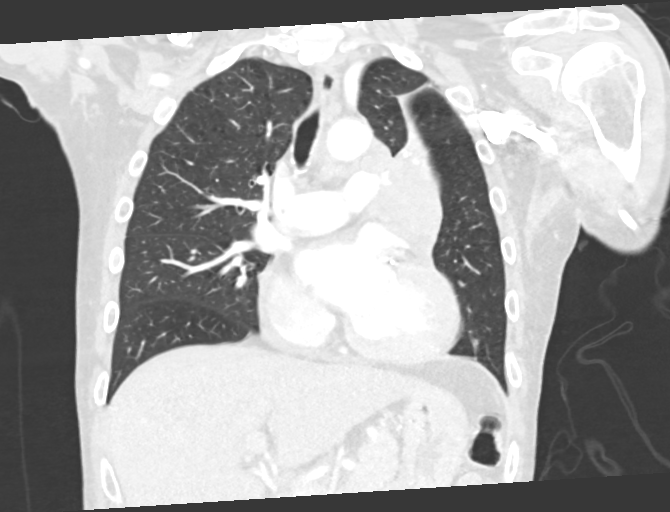
[im 95/158  lung]
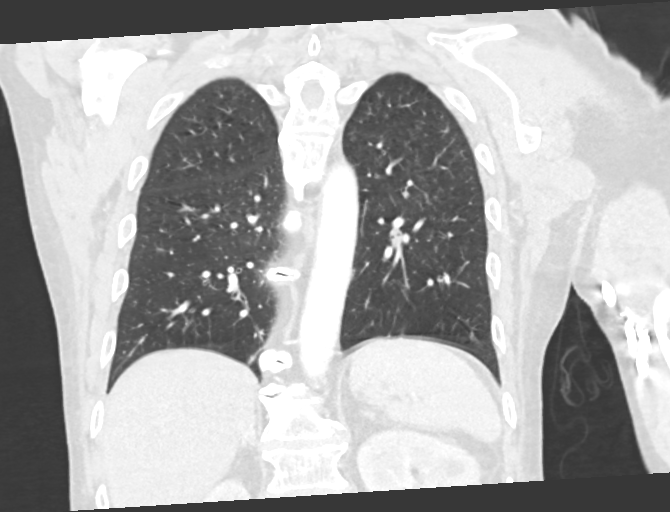

[14 of 36 positions shown; findings below may reference images not displayed]

FINDINGS: Cardiovascular: The heart is unremarkable without pericardial
effusion. Thoracic aorta is normal in caliber without aneurysm or
dissection. No filling defects or pulmonary emboli. Extrinsic
compression upon the left pulmonary artery due to left hilar mass.

Moderate atherosclerosis of the coronary vasculature, with mild
calcified plaque of the aortic arch.

Mediastinum/Nodes: There is a lobular soft tissue mass in the
mediastinum and AP window, measuring 4.7 x 5.3 cm. Large left hilar
mass with obstruction of the left upper lobe bronchus. No
contralateral hilar adenopathy.

There is circumferential wall thickening of the distal thoracic
esophagus, nonspecific. Endoscopy may be useful.

Lungs/Pleura: There is complete collapse of the left upper lobe due
to a large left hilar mass causing abrupt cut off of the left upper
lobe bronchus. Distinguishing the left hilar mass from the
consolidated left upper lobe is difficult. PET scan recommended for
further evaluation.

Background emphysema. Minimal tree in bud ground-glass airspace
disease within the left lower lobe likely inflammatory or
infectious. There is narrowing of the left lower lobe bronchus with
mucoid material filling the posterior basilar segmental bronchi. No
effusion or pneumothorax.

Upper Abdomen: Thickening of the left adrenal gland measuring 12 mm,
suspicious for metastases. Remainder of the upper abdomen is
unremarkable.

Musculoskeletal: No acute or destructive bony lesions. Reconstructed
images demonstrate no additional findings.
IMPRESSION: 1. Left hilar mass and associated mediastinal and left hilar
adenopathy, compatible with malignancy. This mass results in
extrinsic compression of the left upper lobe pulmonary artery, as
well as complete obstruction of the left upper lobe bronchus and
complete left upper lobe atelectasis.
2. Circumferential mural thickening of the distal thoracic
esophagus. Endoscopy recommended for further evaluation.
3. Minimal left lower lobe tree-in-bud ground-glass airspace disease
consistent with inflammation or infection.
4. Left adrenal thickening, nonspecific. Metastatic disease not
excluded.
5.  Emphysema ([AV]-[AV]).

## 2019-10-17 MED ORDER — ACETAMINOPHEN 325 MG PO TABS
650.0000 mg | ORAL_TABLET | Freq: Four times a day (QID) | ORAL | Status: DC | PRN
  Administered 2019-10-19 – 2019-10-31 (×9): 650 mg via ORAL
  Filled 2019-10-17 (×9): qty 2

## 2019-10-17 MED ORDER — ENOXAPARIN SODIUM 40 MG/0.4ML ~~LOC~~ SOLN
40.0000 mg | Freq: Every day | SUBCUTANEOUS | Status: DC
  Administered 2019-10-17 – 2019-10-25 (×9): 40 mg via SUBCUTANEOUS
  Filled 2019-10-17 (×9): qty 0.4

## 2019-10-17 MED ORDER — ACETAMINOPHEN 650 MG RE SUPP: 650.0000 mg | Freq: Four times a day (QID) | RECTAL | Status: DC | PRN

## 2019-10-17 MED ORDER — SODIUM CHLORIDE 0.9 % IV SOLN
100.0000 mg | Freq: Two times a day (BID) | INTRAVENOUS | Status: DC
  Administered 2019-10-17 – 2019-10-20 (×9): 100 mg via INTRAVENOUS
  Filled 2019-10-17 (×14): qty 100

## 2019-10-17 MED ORDER — LORAZEPAM 1 MG PO TABS
1.0000 mg | ORAL_TABLET | Freq: Four times a day (QID) | ORAL | Status: AC
  Administered 2019-10-18 – 2019-10-20 (×7): 1 mg via ORAL
  Filled 2019-10-17 (×7): qty 1

## 2019-10-17 MED ORDER — PANTOPRAZOLE SODIUM 40 MG PO TBEC
40.0000 mg | DELAYED_RELEASE_TABLET | Freq: Every day | ORAL | Status: DC
  Administered 2019-10-17 – 2019-10-24 (×8): 40 mg via ORAL
  Filled 2019-10-17 (×8): qty 1

## 2019-10-17 MED ORDER — THIAMINE HCL 100 MG/ML IJ SOLN
500.0000 mg | Freq: Three times a day (TID) | INTRAVENOUS | Status: AC
  Administered 2019-10-17 – 2019-10-20 (×9): 500 mg via INTRAVENOUS
  Filled 2019-10-17 (×9): qty 5

## 2019-10-17 MED ORDER — ASPIRIN 81 MG PO CHEW
81.0000 mg | CHEWABLE_TABLET | Freq: Every day | ORAL | Status: DC
  Administered 2019-10-17 – 2019-10-23 (×7): 81 mg via ORAL
  Filled 2019-10-17 (×7): qty 1

## 2019-10-17 MED ORDER — AMLODIPINE BESYLATE 5 MG PO TABS
5.0000 mg | ORAL_TABLET | Freq: Every day | ORAL | Status: DC
  Administered 2019-10-17 – 2019-10-22 (×6): 5 mg via ORAL
  Filled 2019-10-17 (×6): qty 1

## 2019-10-17 MED ORDER — ROSUVASTATIN CALCIUM 5 MG PO TABS
5.0000 mg | ORAL_TABLET | Freq: Every evening | ORAL | Status: DC
  Administered 2019-10-17 – 2019-10-20 (×4): 5 mg via ORAL
  Filled 2019-10-17 (×4): qty 1

## 2019-10-17 MED ORDER — POTASSIUM CHLORIDE 10 MEQ/100ML IV SOLN
10.0000 meq | INTRAVENOUS | Status: AC
  Administered 2019-10-17 (×2): 10 meq via INTRAVENOUS
  Filled 2019-10-17 (×2): qty 100

## 2019-10-17 MED ORDER — LORAZEPAM 2 MG/ML IJ SOLN
1.0000 mg | Freq: Four times a day (QID) | INTRAMUSCULAR | Status: DC | PRN
  Administered 2019-10-18 – 2019-10-21 (×3): 1 mg via INTRAVENOUS
  Filled 2019-10-17 (×3): qty 1

## 2019-10-17 MED ORDER — LORAZEPAM 0.5 MG PO TABS
0.5000 mg | ORAL_TABLET | Freq: Four times a day (QID) | ORAL | Status: AC
  Administered 2019-10-20 – 2019-10-22 (×5): 0.5 mg via ORAL
  Filled 2019-10-17 (×5): qty 1

## 2019-10-17 MED ORDER — IOHEXOL 300 MG/ML  SOLN
75.0000 mL | Freq: Once | INTRAMUSCULAR | Status: AC | PRN
  Administered 2019-10-17: 75 mL via INTRAVENOUS

## 2019-10-17 MED ORDER — MOMETASONE FURO-FORMOTEROL FUM 200-5 MCG/ACT IN AERO
2.0000 | INHALATION_SPRAY | Freq: Two times a day (BID) | RESPIRATORY_TRACT | Status: DC
  Administered 2019-10-17 – 2019-11-01 (×24): 2 via RESPIRATORY_TRACT
  Filled 2019-10-17: qty 8.8

## 2019-10-17 MED ORDER — POTASSIUM CHLORIDE 20 MEQ PO PACK
40.0000 meq | PACK | Freq: Once | ORAL | Status: AC
  Administered 2019-10-17: 40 meq via ORAL
  Filled 2019-10-17: qty 2

## 2019-10-17 MED ORDER — HYDRALAZINE HCL 20 MG/ML IJ SOLN
10.0000 mg | INTRAMUSCULAR | Status: DC | PRN
  Administered 2019-10-17 – 2019-11-06 (×20): 10 mg via INTRAVENOUS
  Filled 2019-10-17 (×20): qty 1

## 2019-10-17 MED ORDER — ONDANSETRON HCL 4 MG PO TABS: 4.0000 mg | ORAL_TABLET | Freq: Four times a day (QID) | ORAL | Status: DC | PRN

## 2019-10-17 MED ORDER — GADOBUTROL 1 MMOL/ML IV SOLN
7.0000 mL | Freq: Once | INTRAVENOUS | Status: AC | PRN
  Administered 2019-10-17: 7 mL via INTRAVENOUS

## 2019-10-17 MED ORDER — UMECLIDINIUM BROMIDE 62.5 MCG/INH IN AEPB
1.0000 | INHALATION_SPRAY | Freq: Every day | RESPIRATORY_TRACT | Status: DC
  Administered 2019-10-21 – 2019-11-01 (×10): 1 via RESPIRATORY_TRACT
  Filled 2019-10-17 (×2): qty 7

## 2019-10-17 MED ORDER — LORAZEPAM 1 MG PO TABS
2.0000 mg | ORAL_TABLET | Freq: Four times a day (QID) | ORAL | Status: AC
  Administered 2019-10-17 (×3): 2 mg via ORAL
  Filled 2019-10-17 (×4): qty 2

## 2019-10-17 MED ORDER — SODIUM CHLORIDE 0.9 % IV SOLN: INTRAVENOUS | Status: DC

## 2019-10-17 MED ORDER — LORAZEPAM 0.5 MG PO TABS
0.5000 mg | ORAL_TABLET | Freq: Four times a day (QID) | ORAL | Status: DC | PRN
  Administered 2019-10-23 – 2019-10-24 (×3): 0.5 mg via ORAL
  Filled 2019-10-17 (×3): qty 1

## 2019-10-17 MED ORDER — POTASSIUM CHLORIDE CRYS ER 20 MEQ PO TBCR
60.0000 meq | EXTENDED_RELEASE_TABLET | Freq: Once | ORAL | Status: AC
  Administered 2019-10-17: 60 meq via ORAL
  Filled 2019-10-17: qty 3

## 2019-10-17 MED ORDER — CLOPIDOGREL BISULFATE 75 MG PO TABS
75.0000 mg | ORAL_TABLET | Freq: Every day | ORAL | Status: DC
  Administered 2019-10-17: 09:00:00 75 mg via ORAL
  Filled 2019-10-17: qty 1

## 2019-10-17 MED ORDER — ONDANSETRON HCL 4 MG/2ML IJ SOLN: 4.0000 mg | Freq: Four times a day (QID) | INTRAMUSCULAR | Status: DC | PRN

## 2019-10-17 MED ORDER — HYDROCORTISONE NA SUCCINATE PF 100 MG IJ SOLR
50.0000 mg | Freq: Three times a day (TID) | INTRAMUSCULAR | Status: DC
  Administered 2019-10-17 – 2019-10-18 (×5): 50 mg via INTRAVENOUS
  Filled 2019-10-17 (×5): qty 2

## 2019-10-17 NOTE — Procedures (Signed)
Patient Name: Drew Nguyen  MRN: [id number]  Epilepsy Attending: Lora Havens  Referring Physician/Provider: Dr. Gean Birchwood Date: 10/17/2019 Duration: 25.26 minutes  Patient history: 64 year old male with history of alcohol use, cocaine use who presented with generalized tonic-clonic seizure-like episode.  EEG to evaluate for seizures.  Level of alertness: Awake  AEDs during EEG study: Ativan  Technical aspects: This EEG study was done with scalp electrodes positioned according to the 10-20 International system of electrode placement. Electrical activity was acquired at a sampling rate of 500Hz  and reviewed with a high frequency filter of 70Hz  and a low frequency filter of 1Hz . EEG data were recorded continuously and digitally stored.   Description: The posterior dominant rhythm consists of 9-10 Hz activity of moderate voltage (25-35 uV) seen predominantly in posterior head regions, symmetric and reactive to eye opening and eye closing.  There is also 15 to 18 Hz beta activity  distributed symmetrically and diffusely.  Physiologic photic driving was seen during photic stimulation.  Hyperventilation was not performed.   IMPRESSION: This study is within normal limits. No seizures or epileptiform discharges were seen throughout the recording.  Diannia Hogenson Barbra Sarks

## 2019-10-17 NOTE — Progress Notes (Signed)
Notified Earnest Conroy, MD on patient critical AM lab. Potassium 2.7 at [place] on 2/26

## 2019-10-17 NOTE — H&P (Signed)
History and Physical    Drew Nguyen XMI:[id number] DOB: 19-Aug-1956 DOA: 09/28/2019  PCP: Boyce Medici, FNP  Patient coming from: Home.  Chief Complaint: Seizures.  HPI: Drew Nguyen is a 64 y.o. male with history of stroke in October 2020, hypertension, rheumatoid arthritis, depression tobacco use and polysubstance abuse was witnessed to have a seizure by patient's home health aide. As per the report given to the ER physician patient had a generalized tonic-clonic seizure following which patient lost consciousness lasted for around 1-1/2-minute. Patient does not recall the incident. Per report patient has not been doing well for last few days. Patient denies any headache visual changes or any weakness of the extremities. Per patient's wife with home ER physician discussed patient's wife was positive for gonorrhea and herpes and requested testing for patient. Per report patient has stopped any alcohol. Patient takes hydrocortisone and was recently started on Plaquenil by rheumatologist.  ED Course: In the ER CT head is unremarkable. X-rays were showing abnormal findings concerning for mass for which we did a CT chest with contrast shows left hilar mass with adenopathy concerning for malignancy. Labs show sodium 122 potassium 2.7 WBC 14.4. Patient was given 1 L fluid bolus in the ER. Patient appears slow to respond and had some nystagmus concerning for postictal state versus thiamine deficiency per neurology. Patient admitted for further management of postictal state hyponatremia and lung mass.  Review of Systems: As per HPI, rest all negative.   Past Medical History:  Diagnosis Date  . COPD (chronic obstructive pulmonary disease) ([place])   . Current smoker   . Depression   . Hypertension   . RA (rheumatoid arthritis) ([place])   . Tobacco abuse     Past Surgical History:  Procedure Laterality Date  . ANKLE SURGERY    . MULTIPLE TOOTH EXTRACTIONS       reports that he has been smoking  cigarettes. He has a 72.00 pack-year smoking history. He has never used smokeless tobacco. He reports current alcohol use. He reports current drug use. Drugs: Cocaine and Benzodiazepines.  Allergies  Allergen Reactions  . Celexa [Citalopram] Other (See Comments)    Over sedation Sleeps too much    Family History  Problem Relation Age of Onset  . Hypertension Mother   . Heart attack Mother   . Heart attack Father   . Brain cancer Brother   . Colon cancer Maternal Aunt   . Stomach cancer Neg Hx   . Esophageal cancer Neg Hx   . Pancreatic cancer Neg Hx     Prior to Admission medications   Medication Sig Start Date End Date Taking? Authorizing Provider  acetaminophen (TYLENOL) 500 MG tablet Take 500 mg by mouth every 6 (six) hours as needed for mild pain, fever or headache.     [provider]  amLODipine (NORVASC) 5 MG tablet Take 1 tablet (5 mg total) by mouth daily. 04/20/19 04/19/20  DanfordSuann Larry, MD  cholecalciferol (VITAMIN D) 25 MCG tablet Take 1 tablet (1,000 Units total) by mouth daily. 06/07/19 07/07/19  Terrilee Croak, MD  citalopram (CELEXA) 10 MG tablet Take 10 mg by mouth daily.    [provider]  clopidogrel (PLAVIX) 75 MG tablet Take 1 tablet (75 mg total) by mouth daily. 06/07/19 07/07/19  Dahal, Marlowe Aschoff, MD  COMBIVENT RESPIMAT 20-100 MCG/ACT AERS respimat Inhale 1 puff into the lungs 4 (four) times daily. 03/23/19   [provider]  DULoxetine (CYMBALTA) 30 MG capsule  Take 30 mg by mouth daily.    [provider]  fluconazole (DIFLUCAN) 40 MG/ML suspension Take 40 mg by mouth daily.    [provider]  hydrochlorothiazide (MICROZIDE) 12.5 MG capsule Take 1 capsule (12.5 mg total) by mouth daily. Patient not taking: Reported on 06/11/2019 04/20/19 04/19/20  Edwin Dada, MD  meloxicam (MOBIC) 7.5 MG tablet Take 7.5 mg by mouth. 1 to 2 times daily    [provider]  naproxen sodium (ALEVE) 220 MG tablet  Take 220 mg by mouth as needed.    [provider]  nystatin (MYCOSTATIN) 100000 UNIT/ML suspension Take 10 mLs by mouth 4 (four) times daily. 06/09/19   [provider]  omeprazole (PRILOSEC) 20 MG capsule Take 1 capsule (20 mg total) by mouth daily. 06/06/19 07/06/19  Terrilee Croak, MD  Potassium 99 MG TABS Take 99 mg by mouth daily.    [provider]  predniSONE (DELTASONE) 10 MG tablet Take 5-10 mg by mouth See admin instructions. 10mg  in the morning and 5mg  in the evening 08/23/18   [provider]  rosuvastatin (CRESTOR) 5 MG tablet Take 5 mg by mouth every evening.    [provider]  SYMBICORT 160-4.5 MCG/ACT inhaler Inhale 2 puffs into the lungs 2 (two) times daily. 03/23/19   [provider]    Physical Exam: Constitutional: Moderately built and nourished. Vitals:   10/19/2019 2103 10/11/2019 2230 10/08/2019 2330 09/23/2019 2345  BP: (!) 148/74 (!) 165/93 (!) 164/69 (!) 151/70  Pulse: 84 63 62 61  Resp: 16 (!) 33 15 11  Temp: 98.4 F (36.9 C)     TempSrc: Oral     SpO2: 98% 96% 94% 93%  Weight:      Height:       Eyes: Anicteric no pallor. ENMT: No discharge from the ears eyes nose or mouth. Neck: No mass felt. No neck rigidity. Respiratory: No rhonchi or crepitations. Cardiovascular: S1-S2 heard. Abdomen: Soft nontender bowel sounds present. Musculoskeletal: No edema. No joint effusion. Skin: No rash. Neurologic: Alert awake oriented to time place and person. Moves all extremities. Psychiatric: Appears normal.   Labs on Admission: I have personally reviewed following labs and imaging studies  CBC: Recent Labs  Lab 09/27/2019 2104  WBC 14.4*  HGB 14.8  HCT 39.8  MCV 86.5  PLT 283   Basic Metabolic Panel: Recent Labs  Lab 10/05/2019 2104 09/27/2019 2238  NA 122*  --   K 2.7*  --   CL 77*  --   CO2 31  --   GLUCOSE 143*  --   BUN 12  --   CREATININE 0.75  --   CALCIUM 8.5*  --   MG  --  1.7   GFR: Estimated  Creatinine Clearance: 84.2 mL/min (by C-G formula based on SCr of 0.75 mg/dL). Liver Function Tests: No results for input(s): AST, ALT, ALKPHOS, BILITOT, PROT, ALBUMIN in the last 168 hours. No results for input(s): LIPASE, AMYLASE in the last 168 hours. No results for input(s): AMMONIA in the last 168 hours. Coagulation Profile: No results for input(s): INR, PROTIME in the last 168 hours. Cardiac Enzymes: No results for input(s): CKTOTAL, CKMB, CKMBINDEX, TROPONINI in the last 168 hours. BNP (last 3 results) No results for input(s): PROBNP in the last 8760 hours. HbA1C: No results for input(s): HGBA1C in the last 72 hours. CBG: Recent Labs  Lab 10/18/2019 2106  GLUCAP 144*   Lipid Profile: No results for input(s): CHOL,  HDL, LDLCALC, TRIG, CHOLHDL, LDLDIRECT in the last 72 hours. Thyroid Function Tests: No results for input(s): TSH, T4TOTAL, FREET4, T3FREE, THYROIDAB in the last 72 hours. Anemia Panel: No results for input(s): VITAMINB12, FOLATE, FERRITIN, TIBC, IRON, RETICCTPCT in the last 72 hours. Urine analysis:    Component Value Date/Time   COLORURINE YELLOW 10/06/2019 2238   APPEARANCEUR CLEAR 10/14/2019 2238   LABSPEC 1.013 10/15/2019 2238   PHURINE 7.0 10/09/2019 2238   GLUCOSEU NEGATIVE 09/27/2019 2238   HGBUR NEGATIVE 10/15/2019 2238   [place] 10/07/2019 2238   [place] 09/29/2019 2238   PROTEINUR 100 (A) 10/10/2019 2238   UROBILINOGEN 2.0 (H) 05/03/2010 2059   NITRITE NEGATIVE 09/29/2019 2238   LEUKOCYTESUR NEGATIVE 10/02/2019 2238   Sepsis Labs: @LABRCNTIP (procalcitonin:4,lacticidven:4) )No results found for this or any previous visit (from the past 240 hour(s)).   Radiological Exams on Admission: CT Head Wo Contrast  Result Date: 10/18/2019 CLINICAL DATA:  Altered level of consciousness, seizure EXAM: CT HEAD WITHOUT CONTRAST TECHNIQUE: Contiguous axial images were obtained from the base of the skull through the vertex without  intravenous contrast. COMPARISON:  06/05/2019, 06/06/2019 FINDINGS: Brain: There are chronic small vessel ischemic changes seen within the bilateral basal ganglia and periventricular white matter. No acute infarct or hemorrhage. Lateral ventricles and remaining midline structures are unremarkable. No acute extra-axial fluid collections. No mass effect. Vascular: No hyperdense vessel or unexpected calcification. Skull: Normal. Negative for fracture or focal lesion. Sinuses/Orbits: No acute finding. Other: None IMPRESSION: 1. Chronic small vessel ischemic changes, no acute intracranial process. Electronically Signed   By: Randa Ngo M.D.   On: 09/28/2019 22:54   DG Chest Portable 1 View  Result Date: 09/24/2019 CLINICAL DATA:  Seizure, altered mental status EXAM: PORTABLE CHEST 1 VIEW COMPARISON:  06/06/2019 FINDINGS: Right lung is grossly clear. Asymmetric hazy opacification in the left thorax with increased left hilar density. Heart size upper limits of normal. No pneumothorax. IMPRESSION: Asymmetric hazy opacity in left thorax with asymmetric density in the left hilus, could be due to diffuse lung parenchymal air disease versus lung mass. Chest CT is recommended for further evaluation. Electronically Signed   By: Donavan Foil M.D.   On: 10/17/2019 22:42    EKG: Independently reviewed. Normal sinus rhythm.  Assessment/Plan Principal Problem:   Seizure ([place]) Active Problems:   COPD GOLD I, still smoking   Rheumatoid arthritis ([place])   CVA (cerebral vascular accident) ([place])   Essential hypertension   Hyponatremia    1. Seizure -discussed with neurology. At this time not starting any antiepileptics. Patient probably may be having a procedure with electrolyte abnormalities and I also was concerned that patient was recently quitting alcohol. Closely monitor and check EEG MRI brain. 2. Lung mass seen in the CAT scan concerning for malignancy will need pulmonary input in the morning. There is also  concern for possible infection for which I have placed patient on antibiotics and check procalcitonin. 3. Severe hyponatremia and hypokalemia -patient does take hydrochlorothiazide which could be contributing to patient's symptoms and I'm stopping the hydrochlorothiazide. Did receive 1 L fluid bolus in the ER. Will hold off further fluids until we closely monitor metabolic panel check urine studies including sodium and osmolarity check TSH and cortisol levels. 4. Hypertension on amlodipine will hold off hydrochlorothiazide due to hyponatremia. As needed IV hydralazine. 5. History of rheumatoid arthritis recently started on Plaquenil and patient is already on hydrocortisone. I have placed patient on stress dose of hydrocortisone. 6. History of  polysubstance abuse which patient states he has not been taking any of these for last few weeks. Urine drug screen is negative. Neurologist recommended getting thiamine level because of patient's confusion and nystagmus. Patient is on high dose of thiamine at this time. 7. COPD not actively wheezing. 8. Previous history of stroke on Plavix and statins. 9. Leukocytosis could be reactionary although could be from possible infection as seen in the CAT scan of the lungs. Check procalcitonin levels. Presently on doxycycline.  Home medications needs to be verified.  From ID and [place] probes have been done which are pending.  Given that patient has seizure with severe hyponatremia with lung mass which will require further management patient will need close monitoring for any deterioration in inpatient status.   DVT prophylaxis: Lovenox. Code Status: Full code. Family Communication: Will need to discuss with patient's wife. Disposition Plan: To be determined. Consults called: Neurology. Admission status: Inpatient.   Rise Patience MD [facility] Pager [phone].  If 7PM-7AM, please contact night-coverage [email or web address] Password  [email or web address]  10/17/2019, 1:07 AM

## 2019-10-17 NOTE — Progress Notes (Signed)
EEG complete - results pending 

## 2019-10-17 NOTE — Progress Notes (Addendum)
Subjective: NAEO. Patient states he is feeling much better today.   ROS: negative except above  Examination  Vital signs in last 24 hours: Temp:  [97.8 F (36.6 C)-99.4 F (37.4 C)] 99.4 F (37.4 C) (02/26 0520) Pulse Rate:  [61-84] 73 (02/26 0520) Resp:  [11-33] 18 (02/26 0520) BP: (137-175)/(69-93) 148/74 (02/26 0840) SpO2:  [93 %-98 %] 96 % (02/26 0520) Weight:  [70 kg] 70 kg (02/25 2057)  General: lying in bed, NAD CVS: pulse-normal rate and rhythm RS: breathing comfortably Extremities: normal, wamr  Neuro: MS: Alert, oriented to place and person, time: March 2021, follows commands CN: pupils equal and reactive,  EOMI, face symmetric, tongue midline, normal sensation over face, Motor: 5/5 strength in all 4 extremities Coordination: normal FTN Gait: not tested  Basic Metabolic Panel: Recent Labs  Lab 10/05/2019 2104 09/24/2019 2238 10/17/19 0105 10/17/19 0354  NA 122*  --   --  126*  K 2.7*  --   --  2.4*  CL 77*  --   --  83*  CO2 31  --   --  32  GLUCOSE 143*  --   --  158*  BUN 12  --   --  10  CREATININE 0.75  --  0.68 0.67  CALCIUM 8.5*  --   --  7.8*  MG  --  1.7  --   --     CBC: Recent Labs  Lab 09/26/2019 2104 10/17/19 0105  WBC 14.4* 13.0*  HGB 14.8 12.7*  HCT 39.8 34.4*  MCV 86.5 87.3  PLT 345 285     Coagulation Studies: No results for input(s): LABPROT, INR in the last 72 hours.  Imaging MR brain: 1. Remote nonhemorrhagic lacunar infarcts of the basal ganglia bilaterally extending into the corona radiata. 2. Atrophy and white matter disease are mildly advanced for age. 3. No acute or focal intracranial abnormality to explain seizures or confusion.   ASSESSMENT AND PLAN: 64yo M with h/o cocaine use, alcohol use ( drinks beers) who presented with GTC seizure like episode in setting of stopping alcohol and hyponatremia ( Na 122)  Suspected provoked seizure in setting of alcohol withdrawal hyponatremia Alcohol use disorder with alcohol  withdrawal Hyponatremia Acute toxic metabolic encephalopathy (improving) Dysarthria (POA) -Patient presented with generalized tonic-clonic seizure in setting of stopping alcohol about 1 week ago.  Also had severe hyponatremia which likely also lowered seizure threshold -Dysarthria possibly at baseline worsened by encephalopathy and postictal state -MRI brain without contrast did not show any acute abnormality  Recommendations - Scheduled Ativan 2 mg IV q6h x 4 doses, then 1 mg IV q6h x 8 doses, then 0.5 mg IV q6h x 8 doses, then 0.5mg  PRN based on CIWA -Routine EEG to assess for potential epileptogenicity ordered and pending.  If EEG shows epileptiform abnormality, will consider starting antiepileptic drugs. -Gradual correction of hyponatremia by no more than 0.5 meq/L per hour (or 12 meq/L per day).  -Thiamine level pending, continue high-dose thiamine 500 mg IV 3 times daily for 3 days followed by 100 mg p.o. daily thereafter -Alcohol cessation counseling -Continue seizure precautions -As needed IV Ativan 2 mg for generalized tonic-clonic seizure lasting more than 2 minutes or focal seizure lasting more than 5 minutes  ADDENDUM -routine EEG did not show any potential epileptogenicity.  Therefore will not start any antiepileptic drugs.  Thank you for allowing [place] to participate in the care of this patient.  Neurology will follow peripherally.  Please page neuro  hospitalist for any further questions after 5 PM.  I have spent a total of  25 minutes with the patient reviewing hospital notes,  test results, labs and examining the patient as well as establishing an assessment and plan that was discussed personally with the patient and medicine physician.  > 50% of time was spent in direct patient care.        Drew Nguyen Barbra Sarks

## 2019-10-17 NOTE — Progress Notes (Addendum)
PROGRESS NOTE    Drew Nguyen  PYK:[id number]  DOB: 10/05/1955  PCP: Boyce Medici, FNP 64 y.o. male with history of stroke in 05/2019, hypertension, rheumatoid arthritis, depression  and polysubstance abuse was witnessed to have a seizure by patient's home health aide. As per the report given to the ER physician patient had a generalized tonic-clonic seizure following which patient lost consciousness lasted for around 1-1/2-minute. Patient does not recall the incident. Per report patient has not been doing well for last few days.Per ER physician discussion, patient's wife was positive for gonorrhea and herpes and requested testing for patient. Per report patient has quit alcohol. Patient takes hydrocortisone and was recently started on Plaquenil by rheumatologist.   Subjective: Patient awake alert oriented x3.  Remember sitting in a chair and talking to his sister/brother-in-law.  States he lives with his significant other and 3 children.  Does report recent weight loss does not know how much but denies any loss of appetite.  States has some dysarthria since stroke, relates most of his "generalized weakness" to recent diagnosis of rheumatoid arthritis than stroke.  Objective: Vitals:   10/17/19 0520 10/17/19 0630 10/17/19 0840 10/17/19 1229  BP: (!) 166/83 137/69 (!) 148/74 (!) 158/76  Pulse: 73   76  Resp: 18   16  Temp: 99.4 F (37.4 C)   98.1 F (36.7 C)  TempSrc: Oral   Oral  SpO2: 96%   94%  Weight:      Height:        Intake/Output Summary (Last 24 hours) at 10/17/2019 1757 Last data filed at 10/17/2019 1400 Gross per 24 hour  Intake 1447.06 ml  Output 1230 ml  Net 217.06 ml   Filed Weights   10/04/2019 2057  Weight: 70 kg    Physical Examination:  General exam: Appears calm and comfortable  Respiratory system: Clear to auscultation. Respiratory effort normal. Cardiovascular system: S1 & S2 heard, RRR. No JVD, murmurs, rubs, gallops or clicks. No pedal  edema. Gastrointestinal system: Abdomen is nondistended, soft and nontender. Normal bowel sounds heard. Central nervous system: Alert and oriented.  Appears to have some dysarthria but states baseline from old stroke.  No new focal neurological deficits. Extremities: No contractures, edema or joint deformities.  Skin: No rashes, lesions or ulcers Psychiatry: Judgement and insight appear near normal. Mood & affect somewhat irritable  Data Reviewed: I have personally reviewed following labs and imaging studies  CBC: Recent Labs  Lab 09/26/2019 2104 10/17/19 0105  WBC 14.4* 13.0*  HGB 14.8 12.7*  HCT 39.8 34.4*  MCV 86.5 87.3  PLT 345 539   Basic Metabolic Panel: Recent Labs  Lab 10/15/2019 2104 09/27/2019 2238 10/17/19 0105 10/17/19 0354 10/17/19 0825 10/17/19 1300  NA 122*  --   --  126* 127* 130*  K 2.7*  --   --  2.4* 2.7* 3.5  CL 77*  --   --  83* 84* 90*  CO2 31  --   --  32 31 31  GLUCOSE 143*  --   --  158* 175* 153*  BUN 12  --   --  10 11 11   CREATININE 0.75  --  0.68 0.67 0.70 0.61  CALCIUM 8.5*  --   --  7.8* 8.0* 7.9*  MG  --  1.7  --   --   --   --    GFR: Estimated Creatinine Clearance: 84.2 mL/min (by C-G formula based on SCr of 0.61 mg/dL). Liver Function  Tests: No results for input(s): AST, ALT, ALKPHOS, BILITOT, PROT, ALBUMIN in the last 168 hours. No results for input(s): LIPASE, AMYLASE in the last 168 hours. No results for input(s): AMMONIA in the last 168 hours. Coagulation Profile: No results for input(s): INR, PROTIME in the last 168 hours. Cardiac Enzymes: No results for input(s): CKTOTAL, CKMB, CKMBINDEX, TROPONINI in the last 168 hours. BNP (last 3 results) No results for input(s): PROBNP in the last 8760 hours. HbA1C: No results for input(s): HGBA1C in the last 72 hours. CBG: Recent Labs  Lab 10/06/2019 2106  GLUCAP 144*   Lipid Profile: No results for input(s): CHOL, HDL, LDLCALC, TRIG, CHOLHDL, LDLDIRECT in the last 72 hours. Thyroid  Function Tests: Recent Labs    10/17/19 0110  TSH 0.385   Anemia Panel: No results for input(s): VITAMINB12, FOLATE, FERRITIN, TIBC, IRON, RETICCTPCT in the last 72 hours. Sepsis Labs: Recent Labs  Lab 10/17/19 0105  PROCALCITON 0.86    Recent Results (from the past 240 hour(s))  SARS CORONAVIRUS 2 (TAT 6-24 HRS) Nasopharyngeal Nasopharyngeal Swab     Status: None   Collection Time: 09/29/2019 11:59 PM   Specimen: Nasopharyngeal Swab  Result Value Ref Range Status   SARS Coronavirus 2 NEGATIVE NEGATIVE Final    Comment: (NOTE) SARS-CoV-2 target nucleic acids are NOT DETECTED. The SARS-CoV-2 RNA is generally detectable in upper and lower respiratory specimens during the acute phase of infection. Negative results do not preclude SARS-CoV-2 infection, do not rule out co-infections with other pathogens, and should not be used as the sole basis for treatment or other patient management decisions. Negative results must be combined with clinical observations, patient history, and epidemiological information. The expected result is Negative. Fact Sheet for Patients: SugarRoll.be Fact Sheet for Healthcare Providers: https://www.woods-mathews.com/ This test is not yet approved or cleared by the [place] FDA and  has been authorized for detection and/or diagnosis of SARS-CoV-2 by FDA under an Emergency Use Authorization (EUA). This EUA will remain  in effect (meaning this test can be used) for the duration of the COVID-19 declaration under Section 56 4(b)(1) of the Act, 21 U.S.C. section 360bbb-3(b)(1), unless the authorization is terminated or revoked sooner. Performed at [name], [place] [address], [place], [place] [address]       Radiology Studies: EEG  Result Date: 10/17/2019 Lora Havens, MD     10/17/2019  1:38 PM Patient Name: Drew Nguyen MRN: [id number] Epilepsy Attending: Lora Havens Referring  Physician/Provider: Dr. Gean Birchwood Date: 10/17/2019 Duration: 25.26 minutes Patient history: 64 year old male with history of alcohol use, cocaine use who presented with generalized tonic-clonic seizure-like episode.  EEG to evaluate for seizures. Level of alertness: Awake AEDs during EEG study: Ativan Technical aspects: This EEG study was done with scalp electrodes positioned according to the 10-20 International system of electrode placement. Electrical activity was acquired at a sampling rate of 500Hz  and reviewed with a high frequency filter of 70Hz  and a low frequency filter of 1Hz . EEG data were recorded continuously and digitally stored. Description: The posterior dominant rhythm consists of 9-10 Hz activity of moderate voltage (25-35 uV) seen predominantly in posterior head regions, symmetric and reactive to eye opening and eye closing.  There is also 15 to 18 Hz beta activity  distributed symmetrically and diffusely.  Physiologic photic driving was seen during photic stimulation.  Hyperventilation was not performed. IMPRESSION: This study is within normal limits. No seizures or epileptiform discharges were seen throughout the recording.  Lora Havens   CT Head Wo Contrast  Result Date: 10/11/2019 CLINICAL DATA:  Altered level of consciousness, seizure EXAM: CT HEAD WITHOUT CONTRAST TECHNIQUE: Contiguous axial images were obtained from the base of the skull through the vertex without intravenous contrast. COMPARISON:  06/05/2019, 06/06/2019 FINDINGS: Brain: There are chronic small vessel ischemic changes seen within the bilateral basal ganglia and periventricular white matter. No acute infarct or hemorrhage. Lateral ventricles and remaining midline structures are unremarkable. No acute extra-axial fluid collections. No mass effect. Vascular: No hyperdense vessel or unexpected calcification. Skull: Normal. Negative for fracture or focal lesion. Sinuses/Orbits: No acute finding. Other: None  IMPRESSION: 1. Chronic small vessel ischemic changes, no acute intracranial process. Electronically Signed   By: Randa Ngo M.D.   On: 10/19/2019 22:54   CT CHEST W CONTRAST  Result Date: 10/17/2019 CLINICAL DATA:  Abnormal chest x-ray, pulmonary nodule, seizure, altered level of consciousness EXAM: CT CHEST WITH CONTRAST TECHNIQUE: Multidetector CT imaging of the chest was performed during intravenous contrast administration. CONTRAST:  34mL OMNIPAQUE IOHEXOL 300 MG/ML  SOLN COMPARISON:  10/08/2019, 03/04/2019 FINDINGS: Cardiovascular: The heart is unremarkable without pericardial effusion. Thoracic aorta is normal in caliber without aneurysm or dissection. No filling defects or pulmonary emboli. Extrinsic compression upon the left pulmonary artery due to left hilar mass. Moderate atherosclerosis of the coronary vasculature, with mild calcified plaque of the aortic arch. Mediastinum/Nodes: There is a lobular soft tissue mass in the mediastinum and AP window, measuring 4.7 x 5.3 cm. Large left hilar mass with obstruction of the left upper lobe bronchus. No contralateral hilar adenopathy. There is circumferential wall thickening of the distal thoracic esophagus, nonspecific. Endoscopy may be useful. Lungs/Pleura: There is complete collapse of the left upper lobe due to a large left hilar mass causing abrupt cut off of the left upper lobe bronchus. Distinguishing the left hilar mass from the consolidated left upper lobe is difficult. PET scan recommended for further evaluation. Background emphysema. Minimal tree in bud ground-glass airspace disease within the left lower lobe likely inflammatory or infectious. There is narrowing of the left lower lobe bronchus with mucoid material filling the posterior basilar segmental bronchi. No effusion or pneumothorax. Upper Abdomen: Thickening of the left adrenal gland measuring 12 mm, suspicious for metastases. Remainder of the upper abdomen is unremarkable.  Musculoskeletal: No acute or destructive bony lesions. Reconstructed images demonstrate no additional findings. IMPRESSION: 1. 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 This mass results in extrinsic compression of the left upper lobe pulmonary artery, as well as complete obstruction of the left upper lobe bronchus and complete left upper lobe atelectasis. 2. Circumferential mural thickening of the distal thoracic esophagus. Endoscopy recommended for further evaluation. 3. Minimal left lower lobe tree-in-bud ground-glass airspace disease consistent with inflammation or infection. 4. Left adrenal thickening, nonspecific. Metastatic disease not excluded. 5.  Emphysema (ICD10-J43.9). Electronically Signed   By: Randa Ngo M.D.   On: 10/17/2019 02:01   MR BRAIN W WO CONTRAST  Result Date: 10/17/2019 CLINICAL DATA:  Seizure. Abnormal neuro exam. Hypertension. Patient stopped drinking daily alcohol 1 week ago. Confusion. EXAM: MRI HEAD WITHOUT AND WITH CONTRAST TECHNIQUE: Multiplanar, multiecho pulse sequences of the brain and surrounding structures were obtained without and with intravenous contrast. CONTRAST:  48mL GADAVIST GADOBUTROL 1 MMOL/ML IV SOLN COMPARISON:  CT head without contrast 10/09/2019. MRI of the head 04/19/2019 FINDINGS: Brain: The diffusion-weighted images demonstrate no acute or subacute infarct. Remote lacunar infarcts are  present within the basal ganglia extending superiorly into the corona radiata bilaterally. Mild atrophy and white matter disease is present bilaterally, otherwise stable. The basal ganglia are otherwise unremarkable. Insular ribbon is within limits bilaterally. Study is mildly degraded by patient motion. Temporal lobes are normal. The internal auditory canals are normal bilaterally. The brainstem and cerebellum are normal. Postcontrast images demonstrate no pathologic enhancement. Vascular: Flow is present in the major  intracranial arteries. Skull and upper cervical spine: The craniocervical junction is normal. Upper cervical spine is within normal limits. Marrow signal is unremarkable. Sinuses/Orbits: The paranasal sinuses and mastoid air cells are clear. The globes and orbits are within normal limits. IMPRESSION: 1. Remote nonhemorrhagic lacunar infarcts of the basal ganglia bilaterally extending into the corona radiata. 2. Atrophy and white matter disease are mildly advanced for age. 3. No acute or focal intracranial abnormality to explain seizures or confusion. Electronically Signed   By: San Morelle M.D.   On: 10/17/2019 05:30   DG Chest Portable 1 View  Result Date: 10/11/2019 CLINICAL DATA:  Seizure, altered mental status EXAM: PORTABLE CHEST 1 VIEW COMPARISON:  06/06/2019 FINDINGS: Right lung is grossly clear. Asymmetric hazy opacification in the left thorax with increased left hilar density. Heart size upper limits of normal. No pneumothorax. IMPRESSION: Asymmetric hazy opacity in left thorax with asymmetric density in the left hilus, could be due to diffuse lung parenchymal air disease versus lung mass. Chest CT is recommended for further evaluation. Electronically Signed   By: Donavan Foil M.D.   On: 10/11/2019 22:42        Scheduled Meds: . amLODipine  5 mg Oral Daily  . enoxaparin (LOVENOX) injection  40 mg Subcutaneous Daily  . hydrocortisone sod succinate (SOLU-CORTEF) inj  50 mg Intravenous Q8H  . LORazepam  2 mg Oral Q6H   Followed by  . [START ON 10/18/2019] LORazepam  1 mg Oral Q6H   Followed by  . [START ON 10/20/2019] LORazepam  0.5 mg Oral Q6H  . mometasone-formoterol  2 puff Inhalation BID  . pantoprazole  40 mg Oral Daily  . rosuvastatin  5 mg Oral QPM  . umeclidinium bromide  1 puff Inhalation Daily   Continuous Infusions: . doxycycline (VIBRAMYCIN) IV 100 mg (10/17/19 1023)  . thiamine injection 500 mg (10/17/19 1707)    Assessment & Plan:   Seizure - In the setting  of metabolic derangements (sodium 122)  and quitting alcohol recently. At this time not on any antiepileptics.MRI brain showed no acute abnormality but reported remote nonhemorrhagic lacunar infarcts of the basal ganglia bilaterally extending into the corona radiata. Atrophy/white matter disease mildly advanced for age.  EEG unremarkable.  Urine drug screen is negative. Neurologist recommended getting thiamine level because of patient's confusion and nystagmus. Patient is on high dose of thiamine at this time.  Patient will also be started on Ativan taper per neurology.  HCTZ on hold and sodium improving.   Lung mass seen in the CAT scan concerning for malignancy will need pulmonary input in the morning.  Admitted with empiric antibiotics given leukocytosis.  We will continue given procalcitonin 0.86.  Hyponatremia and hypokalemia -in the setting of HCTZ use. Received 1 L fluid bolus in the ER and now on maintenance IV fluids.  Sodium level has improved to 127-->130 now.. Will continue mild hydration and change BMP to every 8 hours.  Mentating well as of now.  Hypertension on amlodipine will hold off hydrochlorothiazide due to hyponatremia. As needed IV hydralazine.  Recent CVA: On Plavix and statins--will hold Plavix (okay per neurology) in anticipation of bronchoscopy/EUS on Tuesday.  Will place on baby aspirin for now.  History of rheumatoid arthritis recently started on Plaquenil and patient is already on hydrocortisone. I have placed patient on stress dose of hydrocortisone.  History of polysubstance abuse: Urine drug screen is negative. Neurologist recommended getting thiamine level because of patient's confusion and nystagmus. Patient is on high dose of thiamine at this time.   COPD not actively wheezing.  MDI as needed      DVT prophylaxis: Lovenox Code Status: Full code Family / Patient Communication: Discussed with patient in detail Disposition Plan:   Patient is from home prior to  hospitalization. Received/Receiving inpatient care for seizure activity, electrolyte derangements, new lung mass Discharge when cleared by neurology, pulmonary and physical therapy     LOS: 0 days    Time spent: 35 minutes    Guilford Shi, MD [facility] Pager in [place]  If 7PM-7AM, please contact night-coverage [email or web address] 10/17/2019, 5:57 PM

## 2019-10-17 NOTE — Progress Notes (Signed)
CRITICAL VALUE ALERT  Critical Value: potassium 2.4 Date & Time Notied: 2/26 @0505  Provider Notified: Dr. Hal Hope  Orders Received/Actions taken: yes, see MAR

## 2019-10-17 NOTE — Consult Note (Signed)
NAME:  Drew Nguyen, MRN:  [id number], DOB:  Jan 18, 1956, LOS: 0 ADMISSION DATE:  09/24/2019, CONSULTATION DATE:  10/17/19 REFERRING MD:  Guilford Shi, MD CHIEF COMPLAINT:  Left lung mass  Brief History   64 year old male admitted for concerns for seizure-like activity and incidentally found with left hilar mass  History of present illness   [name] is a 64 year old male active smoker with medical history significant for left CVA, EtOH abuse, RA on immunosuppressants who presents with general tonic-clonic seizure. Of note, caregiver reported patient stopped drinking alcohol prior to these symptoms and was reportedly more confused and unable to walk steadily.  Past Medical History  Left corona radiata and lacunar CVA, RA on immunosuppressants, COPD, polysubstance abuse  Significant Hospital Events   2/25 - Admitted  Consults:  PCCM  Procedures:    Significant Diagnostic Tests:  CT Chest 10/17/19 - Compared to 02/2019 CT Lung Screen, interval development of left hilar mass with left upper lobe collapse and pulmonary artery compression and mediastinal adenopathy. Emphysema  Micro Data:    Antimicrobials:   Interim history/subjective:  As above  Objective   Blood pressure (!) 158/76, pulse 76, temperature 98.1 F (36.7 C), temperature source Oral, resp. rate 16, height 5\' 6"  (1.676 m), weight 70 kg, SpO2 94 %.        Intake/Output Summary (Last 24 hours) at 10/17/2019 1651 Last data filed at 10/17/2019 1122 Gross per 24 hour  Intake 1447.06 ml  Output 1030 ml  Net 417.06 ml   Filed Weights   10/05/2019 2057  Weight: 70 kg   Physical Exam: General: Chronically ill-appearing, no acute distress HENT: [place], AT, OP clear, MMM Eyes: EOMI, no scleral icterus Respiratory: Diminished breath sounds bilaterally.  No crackles, wheezing or rales Cardiovascular: RRR, -M/R/G, no JVD GI: BS+, soft, nontender Extremities:-Edema,-tenderness Neuro: Awake, alert and oriented x 3.  Delayed speech pattern. Follows commands Skin: Intact, no rashes or bruising Psych: Normal mood, normal affect  Assessment & Plan:   Left hilar lung mass with adenopathy Based on imaging, lung mass is highly concerning for malignancy. After addressing patient/family's questions, patient wishes to pursue diagnostic testing via bronchoscopy. We discussed risks and benefits of procedure including infection, bleeding and lung collapse. Coordinated procedure with endoscopy suite. --Scheduled for EBUS bronchoscopy on 10/31/2019 @1300  with Dr. Shearon Stalls --Will need to be NPO at midnight the day prior to procedure --Hold anticoagulation (Plavix hold x 5 days)  Seizure secondary to alcohol withdrawal EtOH abuse MR Brain with no acute changes. EEG negative for seizure activity. Hyponatremia may lower seizure threshold --Neurology following --CIWA and folate per primary team  Hyponatremia - likely related to malignancy, hypovolemia --Check urine Na and osmolality  CVA --Hold plavix x 5 days for bronchoscopy  Emphysema On Spiriva at home --Start Incruse while inpatient  Rodman Pickle, M.D. [facility] Pulmonary/Critical Care Medicine 10/17/2019 5:29 PM   Labs   CBC: Recent Labs  Lab 10/02/2019 2104 10/17/19 0105  WBC 14.4* 13.0*  HGB 14.8 12.7*  HCT 39.8 34.4*  MCV 86.5 87.3  PLT 345 992    Basic Metabolic Panel: Recent Labs  Lab 10/11/2019 2104 10/09/2019 2238 10/17/19 0105 10/17/19 0354 10/17/19 0825 10/17/19 1300  NA 122*  --   --  126* 127* 130*  K 2.7*  --   --  2.4* 2.7* 3.5  CL 77*  --   --  83* 84* 90*  CO2 31  --   --  32 31 31  GLUCOSE 143*  --   --  158* 175* 153*  BUN 12  --   --  10 11 11   CREATININE 0.75  --  0.68 0.67 0.70 0.61  CALCIUM 8.5*  --   --  7.8* 8.0* 7.9*  MG  --  1.7  --   --   --   --    GFR: Estimated Creatinine Clearance: 84.2 mL/min (by C-G formula based on SCr of 0.61 mg/dL). Recent Labs  Lab 10/08/2019 2104 10/17/19 0105  PROCALCITON  --  0.86   WBC 14.4* 13.0*    Liver Function Tests: No results for input(s): AST, ALT, ALKPHOS, BILITOT, PROT, ALBUMIN in the last 168 hours. No results for input(s): LIPASE, AMYLASE in the last 168 hours. No results for input(s): AMMONIA in the last 168 hours.  ABG No results found for: PHART, PCO2ART, PO2ART, HCO3, TCO2, ACIDBASEDEF, O2SAT   Coagulation Profile: No results for input(s): INR, PROTIME in the last 168 hours.  Cardiac Enzymes: No results for input(s): CKTOTAL, CKMB, CKMBINDEX, TROPONINI in the last 168 hours.  HbA1C: Hgb A1c MFr Bld  Date/Time Value Ref Range Status  06/06/2019 03:27 AM 5.4 4.8 - 5.6 % Final    Comment:    (NOTE) Pre diabetes:          5.7%-6.4% Diabetes:              >6.4% Glycemic control for   <7.0% adults with diabetes   04/20/2019 05:32 AM 5.1 4.8 - 5.6 % Final    Comment:    (NOTE) Pre diabetes:          5.7%-6.4% Diabetes:              >6.4% Glycemic control for   <7.0% adults with diabetes     CBG: Recent Labs  Lab 10/03/2019 2106  GLUCAP 144*    Review of Systems:   Review of Systems  Constitutional: Negative for chills, diaphoresis, fever, malaise/fatigue and weight loss.  HENT: Negative for congestion, ear pain and sore throat.   Respiratory: Positive for cough. Negative for hemoptysis, sputum production, shortness of breath and wheezing.   Cardiovascular: Negative for chest pain, palpitations and leg swelling.  Gastrointestinal: Negative for abdominal pain, heartburn and nausea.  Genitourinary: Negative for frequency.  Musculoskeletal: Negative for joint pain and myalgias.  Skin: Negative for itching and rash.  Neurological: Positive for speech change and focal weakness. Negative for dizziness, weakness and headaches.  Endo/Heme/Allergies: Does not bruise/bleed easily.  Psychiatric/Behavioral: Negative for depression. The patient is not nervous/anxious.      Past Medical History  He,  has a past medical history of COPD  (chronic obstructive pulmonary disease) ([place]), Current smoker, Depression, Hypertension, RA (rheumatoid arthritis) ([place]), and Tobacco abuse.   Surgical History    Past Surgical History:  Procedure Laterality Date  . ANKLE SURGERY    . MULTIPLE TOOTH EXTRACTIONS       Social History   reports that he has been smoking cigarettes. He has a 72.00 pack-year smoking history. He has never used smokeless tobacco. He reports current alcohol use. He reports current drug use. Drugs: Cocaine and Benzodiazepines.   Family History   His family history includes Brain cancer in his brother; Colon cancer in his maternal aunt; Heart attack in his father and mother; Hypertension in his mother. There is no history of Stomach cancer, Esophageal cancer, or Pancreatic cancer.   Allergies Allergies  Allergen Reactions  .  Celexa [Citalopram] Other (See Comments)    Over sedation Sleeps too much     Home Medications  Prior to Admission medications   Medication Sig Start Date End Date Taking? Authorizing Provider  acetaminophen (TYLENOL) 500 MG tablet Take 500 mg by mouth every 6 (six) hours as needed for mild pain, fever or headache.    Yes [provider]  alendronate (FOSAMAX) 70 MG tablet Take 70 mg by mouth once a week. 10/03/19  Yes [provider]  amLODipine (NORVASC) 5 MG tablet Take 1 tablet (5 mg total) by mouth daily. 04/20/19 04/19/20 Yes Danford, Suann Larry, MD  CALCIUM PO Take 1 tablet by mouth daily.   Yes [provider]  cholecalciferol (VITAMIN D) 25 MCG tablet Take 1 tablet (1,000 Units total) by mouth daily. 06/07/19 10/17/19 Yes Dahal, Marlowe Aschoff, MD  citalopram (CELEXA) 10 MG tablet Take 10 mg by mouth daily.   Yes [provider]  clopidogrel (PLAVIX) 75 MG tablet Take 1 tablet (75 mg total) by mouth daily. 06/07/19 10/17/19 Yes Dahal, Marlowe Aschoff, MD  ENBREL 50 MG/ML injection Inject 1 mL into the skin once a week. 10/14/19  Yes [provider]  EQ  ASPIRIN ADULT LOW DOSE 81 MG EC tablet Take 81 mg by mouth daily. 07/12/19  Yes [provider]  furosemide (LASIX) 20 MG tablet Take 10-20 mg by mouth See admin instructions. Take 20mg  in the morning and 10mg  in the evening. 10/14/19  Yes [provider]  hydroxychloroquine (PLAQUENIL) 200 MG tablet Take 200 mg by mouth daily. 10/08/19  Yes [provider]  ipratropium (ATROVENT) 0.03 % nasal spray Place 2 sprays into both nostrils 2 (two) times daily as needed for rhinitis.  08/05/19  Yes [provider]  losartan (COZAAR) 25 MG tablet Take 25 mg by mouth daily. 10/14/19  Yes [provider]  meloxicam (MOBIC) 7.5 MG tablet Take 7.5 mg by mouth See admin instructions. 1 to 2 times daily    Yes [provider]  nitroGLYCERIN (NITROSTAT) 0.4 MG SL tablet Place 0.4 mg under the tongue every 5 (five) minutes as needed for chest pain. 10/14/19  Yes [provider]  omeprazole (PRILOSEC) 20 MG capsule Take 1 capsule (20 mg total) by mouth daily. 06/06/19 10/17/19 Yes Dahal, Marlowe Aschoff, MD  potassium chloride (KLOR-CON) 10 MEQ tablet Take 10 mEq by mouth 2 (two) times daily.   Yes [provider]  predniSONE (DELTASONE) 10 MG tablet Take 5 mg by mouth in the morning and at bedtime.  08/23/18  Yes [provider]  rosuvastatin (CRESTOR) 5 MG tablet Take 5 mg by mouth every evening.   Yes [provider]  SPIRIVA HANDIHALER 18 MCG inhalation capsule Place 18 mcg into inhaler and inhale daily. 10/15/19  Yes [provider]     Rodman Pickle, M.D. [facility] Pulmonary/Critical Care Medicine 10/17/2019 4:57 PM

## 2019-10-18 LAB — BASIC METABOLIC PANEL
Anion gap: 11 (ref 5–15)
Anion gap: 9 (ref 5–15)
BUN: 12 mg/dL (ref 8–23)
BUN: 16 mg/dL (ref 8–23)
CO2: 27 mmol/L (ref 22–32)
CO2: 27 mmol/L (ref 22–32)
Calcium: 7.7 mg/dL — ABNORMAL LOW (ref 8.9–10.3)
Calcium: 7.8 mg/dL — ABNORMAL LOW (ref 8.9–10.3)
Chloride: 95 mmol/L — ABNORMAL LOW (ref 98–111)
Chloride: 98 mmol/L (ref 98–111)
Creatinine, Ser: 0.64 mg/dL (ref 0.61–1.24)
Creatinine, Ser: 0.65 mg/dL (ref 0.61–1.24)
GFR calc Af Amer: >60 mL/min (ref >60)
GFR calc Af Amer: >60 mL/min (ref >60)
GFR calc non Af Amer: >60 mL/min (ref >60)
GFR calc non Af Amer: >60 mL/min (ref >60)
Glucose, Bld: 123 mg/dL — ABNORMAL HIGH (ref 70–99)
Glucose, Bld: 130 mg/dL — ABNORMAL HIGH (ref 70–99)
Potassium: 2.6 mmol/L — CL (ref 3.5–5.1)
Potassium: 3 mmol/L — ABNORMAL LOW (ref 3.5–5.1)
Sodium: 133 mmol/L — ABNORMAL LOW (ref 135–145)
Sodium: 134 mmol/L — ABNORMAL LOW (ref 135–145)

## 2019-10-18 LAB — MAGNESIUM: Magnesium: 1.8 mg/dL (ref 1.7–2.4)

## 2019-10-18 MED ORDER — LORAZEPAM 1 MG PO TABS
1.0000 mg | ORAL_TABLET | ORAL | Status: AC | PRN
  Administered 2019-10-20: 2 mg via ORAL
  Filled 2019-10-18: qty 2

## 2019-10-18 MED ORDER — LORAZEPAM 2 MG/ML IJ SOLN
2.0000 mg | Freq: Once | INTRAMUSCULAR | Status: AC
  Administered 2019-10-18: 22:00:00 2 mg via INTRAVENOUS
  Filled 2019-10-18: qty 1

## 2019-10-18 MED ORDER — LORAZEPAM 2 MG/ML IJ SOLN: 0.0000 mg | Freq: Four times a day (QID) | INTRAMUSCULAR | Status: DC

## 2019-10-18 MED ORDER — HYDROCORTISONE NA SUCCINATE PF 100 MG IJ SOLR
50.0000 mg | Freq: Two times a day (BID) | INTRAMUSCULAR | Status: DC
  Administered 2019-10-18 – 2019-10-20 (×5): 50 mg via INTRAVENOUS
  Filled 2019-10-18 (×5): qty 2

## 2019-10-18 MED ORDER — LORAZEPAM 2 MG/ML IJ SOLN
1.0000 mg | INTRAMUSCULAR | Status: AC | PRN
  Administered 2019-10-19: 3 mg via INTRAVENOUS
  Administered 2019-10-19 (×2): 2 mg via INTRAVENOUS
  Administered 2019-10-20: 3 mg via INTRAVENOUS
  Administered 2019-10-21: 1 mg via INTRAVENOUS
  Administered 2019-10-21 (×2): 2 mg via INTRAVENOUS
  Filled 2019-10-18: qty 2
  Filled 2019-10-18 (×4): qty 1
  Filled 2019-10-18: qty 2
  Filled 2019-10-18 (×2): qty 1

## 2019-10-18 MED ORDER — POTASSIUM CHLORIDE 20 MEQ PO PACK
40.0000 meq | PACK | Freq: Two times a day (BID) | ORAL | Status: DC
  Administered 2019-10-18 – 2019-10-23 (×11): 40 meq via ORAL
  Filled 2019-10-18 (×14): qty 2

## 2019-10-18 MED ORDER — LORAZEPAM 2 MG/ML IJ SOLN: 0.0000 mg | Freq: Two times a day (BID) | INTRAMUSCULAR | Status: DC

## 2019-10-18 NOTE — Progress Notes (Signed)
Pt pulling on IV lines and attempting to get out of bed Family at bedside, attempting to reorient  Offered water, pt denied Pt refusing to get back into bed, pt unable to ambulate or stand independently  Pt repositioned back into bed with RN and NT assistance  Bed in lowest position, bed alarm set  Family educated on plan of care  Will continue to monitor

## 2019-10-18 NOTE — Progress Notes (Signed)
Pt attempting to get out of bed, pulling at IV lines, swinging arms at staff members and yelling.  Provided water to patient and attempted to reorient.   Pt refusing to get back into bed.  Secure messaged MD to inform.  Pt received IV ativan at 1115. MD aware, sitter order placed.  NT at bedside. Will continue to monitor

## 2019-10-18 NOTE — Progress Notes (Signed)
MD returned page, aware of critical potassium  Will continue to monitor

## 2019-10-18 NOTE — Progress Notes (Signed)
Hospitalist progress note   Patient from home, Patient going likely home, Dispo unclear at this time  Drew Nguyen [id number] DOB: 12-13-1955 DOA: 10/15/2019  PCP: Boyce Medici, FNP   Narrative:  34 white male HTN, COPD stage II-III, cocaine/EtOH abuse, depression, rheumatoid arthritis on recently started on Plaquenil, left corona radiata CVA 03/2019 with repeat stroke 06/06/2019,?  Vocal cord malignancy Presented to [facility] ED 10/17/2019 2/2 GTCS witnessed by home health aide Work-up = left hilar mass?  Adenopathy?  Malignancy sodium 120 2K2.7 WBC 14 Neurology consulted, pulmonology consulted  Data Reviewed:  Sodium 133 potassium 2.6 magnesium 1.8 BUN/creatinine stable 12/0.6 calcium 7.7 Assessment & Plan:  GTCS Probably related to EtOH withdrawal was confused pulling out IVs last night note that he is not getting the CIWA protocol checks so have added that-he received Ativan and this seemed to help with some degree-he is not oriented at this time His EEG this admit was negative for seizures Lung mass Scheduled for EBUS 11/14/2019-defer to pulmonology further planning Started doxycycline on 2/26 would complete course on 3/2  Hyponatremia hypokalemia on admission Hypokalemia is rather severe we will replace with K. Dur 40 twice daily, replace magnesium 400 daily Urine sodium is 37 urine osmolality is also low at 238 for this is probably hypovolemic hyponatremia, continue saline 40 cc/H CVA 03/2019, 05/2019 Plavix has been held for upcoming procedure for at least 5 days continue aspirin 81 mg RA on Plaquenil Plaquenil, Enbrel have been held at this time-he is on stress dose steroids Solu-Cortef 50 every 8 which I will cut back to every 12 on 2/27 Has been cut back recently from 15 mg prednisone to 10 per brother Cocaine/EtOH/tobacco See above discussion unlikely to quit He was confused today and started on CIWA checks-brother states he drinks Beer with [facility] but not heavily per  brother--we will follow and adjust plan accrodingly Vocal cord malignancy Unclear where this was worked up will need outpatient follow-up Leukocytosis Community-acquired pneumonia/postobstructive pneumonia Doxycycline as above monitor trends Leukocytosis could also be secondary to stress dosing of steroids HTN Continue amlodipine 5 alone-holding losartan 25 Depression Continue citalopram 10 daily Reflux  Long discussion with brother on phone 2/27-he does states he does not definitely drink and is not usually too confused We will have to monitor his CIWA and adjust doses of meds such as Ativan etc. going forward  Subjective:  Confused Pulling out IVs Cannot get a clear history from him No chest pain no fever Nursing in room that he is compliant with treatment   Consultants:   Neurology  Pulmonology  Objective: Vitals:   10/17/19 2151 10/18/19 0423 10/18/19 0514 10/18/19 0559  BP: (!) 176/74 (!) 178/88 (!) 172/86 (!) 145/72  Pulse: 61 90 82 90  Resp: 18 18  18   Temp: 98.1 F (36.7 C) 97.7 F (36.5 C)    TempSrc:  Oral    SpO2: 97%   92%  Weight:      Height:        Intake/Output Summary (Last 24 hours) at 10/18/2019 4010 Last data filed at 10/18/2019 2725 Gross per 24 hour  Intake 1262.89 ml  Output 800 ml  Net 462.89 ml   Filed Weights   10/10/2019 2057  Weight: 70 kg    Examination: EOMI NCAT no focal deficit chest clinically clear no added sound no rales no rhonchi S1-S2 no murmur Quite drowsy Abdomen soft Neurologically moves all 4 limbs equally and follows commands but not making much  sense  Scheduled Meds: . amLODipine  5 mg Oral Daily  . aspirin  81 mg Oral Daily  . enoxaparin (LOVENOX) injection  40 mg Subcutaneous Daily  . hydrocortisone sod succinate (SOLU-CORTEF) inj  50 mg Intravenous Q8H  . LORazepam  2 mg Oral Q6H   Followed by  . LORazepam  1 mg Oral Q6H   Followed by  . [START ON 10/20/2019] LORazepam  0.5 mg Oral Q6H  .  mometasone-formoterol  2 puff Inhalation BID  . pantoprazole  40 mg Oral Daily  . potassium chloride  40 mEq Oral BID  . rosuvastatin  5 mg Oral QPM  . umeclidinium bromide  1 puff Inhalation Daily   Continuous Infusions: . sodium chloride 40 mL/hr at 10/17/19 1817  . doxycycline (VIBRAMYCIN) IV 100 mg (10/18/19 0934)  . thiamine injection 500 mg (10/18/19 0931)     LOS: 1 day   Time spent: [place], MD [facility]  10/18/2019, 7:22 AM

## 2019-10-18 NOTE — Progress Notes (Signed)
Paged MD to inform of critical potassium at 2.6 Awaiting call back

## 2019-10-19 LAB — CBC WITH DIFFERENTIAL/PLATELET
Abs Immature Granulocytes: 0.29 10*3/uL — ABNORMAL HIGH (ref 0.00–0.07)
Basophils Absolute: 0 10*3/uL (ref 0.0–0.1)
Basophils Relative: 0 %
Eosinophils Absolute: 0 10*3/uL (ref 0.0–0.5)
Eosinophils Relative: 0 %
HCT: 36.2 % — ABNORMAL LOW (ref 39.0–52.0)
Hemoglobin: 12.5 g/dL — ABNORMAL LOW (ref 13.0–17.0)
Immature Granulocytes: 2 %
Lymphocytes Relative: 6 %
Lymphs Abs: 0.8 10*3/uL (ref 0.7–4.0)
MCH: 31.8 pg (ref 26.0–34.0)
MCHC: 34.5 g/dL (ref 30.0–36.0)
MCV: 92.1 fL (ref 80.0–100.0)
Monocytes Absolute: 0.6 10*3/uL (ref 0.1–1.0)
Monocytes Relative: 4 %
Neutro Abs: 13 10*3/uL — ABNORMAL HIGH (ref 1.7–7.7)
Neutrophils Relative %: 88 %
Platelets: 286 10*3/uL (ref 150–400)
RBC: 3.93 MIL/uL — ABNORMAL LOW (ref 4.22–5.81)
RDW: 12.4 % (ref 11.5–15.5)
WBC: 14.8 10*3/uL — ABNORMAL HIGH (ref 4.0–10.5)
nRBC: 0 % (ref 0.0–0.2)

## 2019-10-19 LAB — COMPREHENSIVE METABOLIC PANEL
ALT: 212 U/L — ABNORMAL HIGH (ref 0–44)
AST: 98 U/L — ABNORMAL HIGH (ref 15–41)
Albumin: 2.9 g/dL — ABNORMAL LOW (ref 3.5–5.0)
Alkaline Phosphatase: 144 U/L — ABNORMAL HIGH (ref 38–126)
Anion gap: 10 (ref 5–15)
BUN: 12 mg/dL (ref 8–23)
CO2: 28 mmol/L (ref 22–32)
Calcium: 7.8 mg/dL — ABNORMAL LOW (ref 8.9–10.3)
Chloride: 99 mmol/L (ref 98–111)
Creatinine, Ser: 0.91 mg/dL (ref 0.61–1.24)
GFR calc Af Amer: >60 mL/min (ref >60)
GFR calc non Af Amer: >60 mL/min (ref >60)
Glucose, Bld: 126 mg/dL — ABNORMAL HIGH (ref 70–99)
Potassium: 2.6 mmol/L — CL (ref 3.5–5.1)
Sodium: 137 mmol/L (ref 135–145)
Total Bilirubin: 0.8 mg/dL (ref 0.3–1.2)
Total Protein: 5.3 g/dL — ABNORMAL LOW (ref 6.5–8.1)

## 2019-10-19 LAB — PROTIME-INR
INR: 1 (ref 0.8–1.2)
Prothrombin Time: 12.6 seconds (ref 11.4–15.2)

## 2019-10-19 MED ORDER — PHENOL 1.4 % MT LIQD
1.0000 | OROMUCOSAL | Status: DC | PRN
  Administered 2019-10-19: 1 via OROMUCOSAL
  Filled 2019-10-19: qty 177

## 2019-10-19 MED ORDER — POTASSIUM CHLORIDE CRYS ER 20 MEQ PO TBCR
40.0000 meq | EXTENDED_RELEASE_TABLET | Freq: Once | ORAL | Status: AC
  Administered 2019-10-19: 40 meq via ORAL
  Filled 2019-10-19: qty 2

## 2019-10-19 MED ORDER — GUAIFENESIN-DM 100-10 MG/5ML PO SYRP
5.0000 mL | ORAL_SOLUTION | ORAL | Status: DC | PRN
  Administered 2019-10-19 – 2019-11-01 (×7): 5 mL via ORAL
  Filled 2019-10-19: qty 5
  Filled 2019-10-19: qty 10
  Filled 2019-10-19: qty 5
  Filled 2019-10-19: qty 10
  Filled 2019-10-19 (×3): qty 5
  Filled 2019-10-19 (×2): qty 10

## 2019-10-19 MED ORDER — POTASSIUM CHLORIDE 2 MEQ/ML IV SOLN
INTRAVENOUS | Status: AC
  Filled 2019-10-19 (×2): qty 1000

## 2019-10-19 NOTE — Progress Notes (Signed)
CRITICAL VALUE ALERT  Critical Value:  Potassium 2.6  Date & Time Notied:  10/19/19 0550  Provider Notified: Leroy Kennedy  Orders Received/Actions taken: awaiting call back

## 2019-10-19 NOTE — Progress Notes (Signed)
Hospitalist progress note   Patient from home, Patient going likely home, Dispo unclear at this time  Drew Nguyen [id number] DOB: Mar 27, 1956 DOA: 10/08/2019  PCP: Boyce Medici, FNP   Narrative:  39 white male HTN, COPD stage II-III, cocaine/EtOH abuse, depression, rheumatoid arthritis on recently started on Plaquenil, left corona radiata CVA 03/2019 with repeat stroke 06/06/2019,?  Vocal cord malignancy Presented to [facility] ED 10/17/2019 2/2 GTCS witnessed by home health aide Work-up = left hilar mass?  Adenopathy?  Malignancy sodium 120 2K2.7 WBC 14 Neurology consulted, pulmonology consulted  Data Reviewed:  Sodium 133 potassium 2.6 magnesium 1.8 BUN/creatinine stable 12/0.6 calcium 7.7 Assessment & Plan:  GTCS Metabolic encephalopathy Ativan has help with some degree-more re-directable His EEG this admit was negative for seizures Lung mass Scheduled for EBUS 10/26/2019-defer to pulmonology further planning Started doxycycline on 2/26 would complete course on 3/2   hypokalemia on admission Still severe hypokalemia replace with IV fluid and 60 of K in addition to oral replacement recheck labs a.m. Hypovolemic hyponatremia Improving with replacement of electrolytes and 40 cc saline per hour CVA 03/2019, 05/2019 Plavix has been held for upcoming procedure for at least 5 days continue aspirin 81 mg RA on Plaquenil Plaquenil, Enbrel have been held at this time-he is on stress dose steroids Solu-Cortef 50 every 8 which I will cut back to every 12 on 2/27 Has been cut back recently from 15 mg prednisone to 10 per brother Cocaine/EtOH/tobacco Elevated ALT >AST Brother relates he drinks occasionally patient states he drinks 4 beers a day This may account for his deranged LFTs Vocal cord malignancy Unclear where this was worked up will need outpatient follow-up Leukocytosis Community-acquired pneumonia/postobstructive pneumonia Doxycycline as above monitor trends Leukocytosis could also be  secondary to stress dosing of steroids HTN Continue amlodipine 5 alone-holding losartan 25 Depression Continue citalopram 10 daily Reflux  Long discussion with brother on phone 2/27-await further management from pulmonology  Subjective:  Awake somewhat agitated but more coherent seems to be orienting better CIWA ranging 9-13   Consultants:   Neurology  Pulmonology  Objective: Vitals:   10/19/19 0259 10/19/19 0514 10/19/19 0939 10/19/19 1231  BP: (!) 174/74 (!) 174/80  (!) 158/92  Pulse: 72 71  83  Resp:  18  18  Temp:  98.1 F (36.7 C)  98.4 F (36.9 C)  TempSrc:  Oral  Oral  SpO2:  96% 97% 93%  Weight:      Height:        Intake/Output Summary (Last 24 hours) at 10/19/2019 1532 Last data filed at 10/19/2019 1420 Gross per 24 hour  Intake 510 ml  Output 3400 ml  Net -2890 ml   Filed Weights   09/26/2019 2057  Weight: 70 kg    Examination: EOMI NCAT no focal deficit  chest clear without added sounds S1-S2 no murmur Abdomen soft Neurologically moves all 4 limbs equally and follows commands making more sense today  Scheduled Meds: . amLODipine  5 mg Oral Daily  . aspirin  81 mg Oral Daily  . enoxaparin (LOVENOX) injection  40 mg Subcutaneous Daily  . hydrocortisone sod succinate (SOLU-CORTEF) inj  50 mg Intravenous Q12H  . LORazepam  1 mg Oral Q6H   Followed by  . [START ON 10/20/2019] LORazepam  0.5 mg Oral Q6H  . mometasone-formoterol  2 puff Inhalation BID  . pantoprazole  40 mg Oral Daily  . potassium chloride  40 mEq Oral BID  . rosuvastatin  5 mg  Oral QPM  . umeclidinium bromide  1 puff Inhalation Daily   Continuous Infusions: . sodium chloride 40 mL/hr at 10/17/19 1817  . doxycycline (VIBRAMYCIN) IV 100 mg (10/19/19 0927)  . lactated ringers 1,000 mL with potassium chloride 60 mEq infusion 100 mL/hr at 10/19/19 1100  . thiamine injection 500 mg (10/19/19 0930)     LOS: 2 days   Time spent: [place], MD [facility]  10/19/2019,  3:32 PM

## 2019-10-20 DIAGNOSIS — G9340 Encephalopathy, unspecified: Secondary | ICD-10-CM

## 2019-10-20 DIAGNOSIS — R918 Other nonspecific abnormal finding of lung field: Secondary | ICD-10-CM

## 2019-10-20 LAB — BASIC METABOLIC PANEL
Anion gap: 12 (ref 5–15)
Anion gap: 9 (ref 5–15)
BUN: 11 mg/dL (ref 8–23)
BUN: 12 mg/dL (ref 8–23)
CO2: 26 mmol/L (ref 22–32)
CO2: 27 mmol/L (ref 22–32)
Calcium: 8.2 mg/dL — ABNORMAL LOW (ref 8.9–10.3)
Calcium: 8 mg/dL — ABNORMAL LOW (ref 8.9–10.3)
Chloride: 101 mmol/L (ref 98–111)
Chloride: 101 mmol/L (ref 98–111)
Creatinine, Ser: 0.57 mg/dL — ABNORMAL LOW (ref 0.61–1.24)
Creatinine, Ser: 0.65 mg/dL (ref 0.61–1.24)
GFR calc Af Amer: >60 mL/min (ref >60)
GFR calc Af Amer: >60 mL/min (ref >60)
GFR calc non Af Amer: >60 mL/min (ref >60)
GFR calc non Af Amer: >60 mL/min (ref >60)
Glucose, Bld: 131 mg/dL — ABNORMAL HIGH (ref 70–99)
Glucose, Bld: 118 mg/dL — ABNORMAL HIGH (ref 70–99)
Potassium: 3.1 mmol/L — ABNORMAL LOW (ref 3.5–5.1)
Potassium: 3 mmol/L — ABNORMAL LOW (ref 3.5–5.1)
Sodium: 139 mmol/L (ref 135–145)
Sodium: 137 mmol/L (ref 135–145)

## 2019-10-20 LAB — CBC WITH DIFFERENTIAL/PLATELET
Abs Immature Granulocytes: 0.31 10*3/uL — ABNORMAL HIGH (ref 0.00–0.07)
Basophils Absolute: 0.1 10*3/uL (ref 0.0–0.1)
Basophils Relative: 0 %
Eosinophils Absolute: 0 10*3/uL (ref 0.0–0.5)
Eosinophils Relative: 0 %
HCT: 40.4 % (ref 39.0–52.0)
Hemoglobin: 13.9 g/dL (ref 13.0–17.0)
Immature Granulocytes: 2 %
Lymphocytes Relative: 4 %
Lymphs Abs: 0.7 10*3/uL (ref 0.7–4.0)
MCH: 31.8 pg (ref 26.0–34.0)
MCHC: 34.4 g/dL (ref 30.0–36.0)
MCV: 92.4 fL (ref 80.0–100.0)
Monocytes Absolute: 0.6 10*3/uL (ref 0.1–1.0)
Monocytes Relative: 4 %
Neutro Abs: 15.3 10*3/uL — ABNORMAL HIGH (ref 1.7–7.7)
Neutrophils Relative %: 90 %
Platelets: 260 10*3/uL (ref 150–400)
RBC: 4.37 MIL/uL (ref 4.22–5.81)
RDW: 12.3 % (ref 11.5–15.5)
WBC: 17 10*3/uL — ABNORMAL HIGH (ref 4.0–10.5)
nRBC: 0 % (ref 0.0–0.2)

## 2019-10-20 LAB — GC/CHLAMYDIA PROBE AMP (~~LOC~~) NOT AT ARMC
Chlamydia: NEGATIVE
Comment: NEGATIVE
Comment: NORMAL
Neisseria Gonorrhea: NEGATIVE

## 2019-10-20 MED ORDER — LIDOCAINE HCL URETHRAL/MUCOSAL 2 % EX GEL
1.0000 "application " | Freq: Once | CUTANEOUS | Status: DC
  Filled 2019-10-20: qty 20

## 2019-10-20 MED ORDER — RISPERIDONE 1 MG PO TBDP
1.0000 mg | ORAL_TABLET | Freq: Every day | ORAL | Status: DC
  Administered 2019-10-20 – 2019-10-31 (×11): 1 mg via ORAL
  Filled 2019-10-20 (×14): qty 1

## 2019-10-20 MED ORDER — POTASSIUM CHLORIDE 2 MEQ/ML IV SOLN: INTRAVENOUS | Status: DC

## 2019-10-20 MED ORDER — KCL IN DEXTROSE-NACL 40-5-0.9 MEQ/L-%-% IV SOLN
INTRAVENOUS | Status: DC
  Filled 2019-10-20 (×2): qty 1000

## 2019-10-20 MED ORDER — PHENYLEPHRINE HCL 0.25 % NA SOLN: 1.0000 | Freq: Four times a day (QID) | NASAL | Status: DC | PRN

## 2019-10-20 NOTE — Progress Notes (Signed)
NAME:  Drew Nguyen, MRN:  [id number], DOB:  02-27-1956, LOS: 3 ADMISSION DATE:  10/07/2019, CONSULTATION DATE:  10/17/2019 REFERRING MD:  Nita Sells, MD, CHIEF COMPLAINT:  Left lung mass  Brief History   Drew Nguyen is a 64 year old male active smoker with medical history significant for left CVA, EtOH abuse, RA on immunosuppressants who presents with general tonic-clonic seizure. Of note, caregiver reported patient stopped drinking alcohol prior to these symptoms and was reportedly more confused and unable to walk steadily. admitted for concerns for seizure-like activity and incidentally found with left hilar mass. PCCM consulted for diagnosis of suspected lung cancer  Interim history/subjective:  Overnight more delirious, on CIWA. Has a sitter now. He is unable to participate in interview or consistently follow commands. Asking if he can take his restraints off.   Objective   Blood pressure (!) 155/74, pulse 86, temperature 99.3 F (37.4 C), temperature source Oral, resp. rate 16, height 5\' 6"  (1.676 m), weight 70 kg, SpO2 100 %.        Intake/Output Summary (Last 24 hours) at 10/20/2019 1445 Last data filed at 10/20/2019 1321 Gross per 24 hour  Intake 3094.3 ml  Output 2500 ml  Net 594.3 ml   Filed Weights   09/22/2019 2057  Weight: 70 kg    Examination: General: delirious, appears fatigued, hypoactive. Sitter in room.  Lungs: clear to auscultation, no wheezes, stable on room air Cardiovascular: RRR Abdomen: soft Extremities: no edema Neuro: delirious, in bilateral mitten restraints  Assessment & Plan:  Drew Nguyen is a 64 y.o. man who presents with:  Acute encephalopathy - unclear etiology, possible etoh withdrawal, substance use, delirium Left upper lobe mass with endobronchial collapse and hilar adenopathy  Plan: Tentative plan is for bronchoscopy  He is somewhat altered and not consentable today - I have spoken with and consented his brother. He will be  evaluated by anesthesia prior to proceeding with case tomorrow.  Discussed with Dr. Verlon Au.   Lenice Llamas, MD Pulmonary and [place] Pager: [place]   CBC: Recent Labs  Lab 10/06/2019 2104 10/17/19 0105 10/19/19 0445 10/20/19 0117  WBC 14.4* 13.0* 14.8* 17.0*  NEUTROABS  --   --  13.0* 15.3*  HGB 14.8 12.7* 12.5* 13.9  HCT 39.8 34.4* 36.2* 40.4  MCV 86.5 87.3 92.1 92.4  PLT 345 285 286 828    Basic Metabolic Panel: Recent Labs  Lab 09/22/2019 2238 10/17/19 0105 10/18/19 0631 10/18/19 1405 10/19/19 0445 10/20/19 0117 10/20/19 0822  NA  --    < > 133* 134* 137 139 137  K  --    < > 2.6* 3.0* 2.6* 3.1* 3.0*  CL  --    < > 95* 98 99 101 101  CO2  --    < > 27 27 28 26 27   GLUCOSE  --    < > 123* 130* 126* 131* 118*  BUN  --    < > 12 16 12 11 12   CREATININE  --    < > 0.64 0.65 0.91 0.57* 0.65  CALCIUM  --    < > 7.7* 7.8* 7.8* 8.2* 8.0*  MG 1.7  --  1.8  --   --   --   --    < > = values in this interval not displayed.   GFR: Estimated Creatinine Clearance: 84.2 mL/min (by C-G formula based on SCr of 0.65 mg/dL). Recent  Labs  Lab 09/29/2019 2104 10/17/19 0105 10/19/19 0445 10/20/19 0117  PROCALCITON  --  0.86  --   --   WBC 14.4* 13.0* 14.8* 17.0*    Liver Function Tests: Recent Labs  Lab 10/19/19 0445  AST 98*  ALT 212*  ALKPHOS 144*  BILITOT 0.8  PROT 5.3*  ALBUMIN 2.9*   No results for input(s): LIPASE, AMYLASE in the last 168 hours. No results for input(s): AMMONIA in the last 168 hours.  ABG No results found for: PHART, PCO2ART, PO2ART, HCO3, TCO2, ACIDBASEDEF, O2SAT   Coagulation Profile: Recent Labs  Lab 10/19/19 0445  INR 1.0    Cardiac Enzymes: No results for input(s): CKTOTAL, CKMB, CKMBINDEX, TROPONINI in the last 168 hours.  HbA1C: Hgb A1c MFr Bld  Date/Time Value Ref Range Status  06/06/2019 03:27 AM 5.4 4.8 - 5.6 % Final    Comment:    (NOTE) Pre  diabetes:          5.7%-6.4% Diabetes:              >6.4% Glycemic control for   <7.0% adults with diabetes   04/20/2019 05:32 AM 5.1 4.8 - 5.6 % Final    Comment:    (NOTE) Pre diabetes:          5.7%-6.4% Diabetes:              >6.4% Glycemic control for   <7.0% adults with diabetes     CBG: Recent Labs  Lab 10/09/2019 2106  GLUCAP 144*

## 2019-10-20 NOTE — Progress Notes (Signed)
Pt asleep.

## 2019-10-20 NOTE — Progress Notes (Addendum)
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  Vocal cord malignancy Presented to [facility] ED 10/17/2019 2/2 GTCS witnessed by home health aide Work-up = left hilar mass?  Adenopathy?  Malignancy sodium 120 2K2.7 WBC 14 Neurology consulted, pulmonology consulted  Data Reviewed:  Sodium 133-->139 potassium 2.6-->3.1-->3.0 BUN/creatinine stable 12/0.6-->11/0.5-->12/0.6 calcium 7.7 WBC 17 Assessment & Plan:  GTCS Metabolic encephalopathy Ativan has help with some degree-more re-directable but agitated overnight--adding qhs risperdal Adding B12, TSH to rule out other causes although unlikely His EEG this admit was negative for seizures Lung mass Scheduled for EBUS 10/22/2019-await results Started doxycycline on 2/26 would complete course on 3/2   hypokalemia on admission Continue IV fluid and 60 of K in addition to oral replacement 40 twice daily  Hypovolemic hyponatremia Improved and resolved CVA 03/2019, 05/2019 Plavix has been held for upcoming procedure for at least 5 days continue aspirin 81 mg RA on Plaquenil Plaquenil, Enbrel have been held at this time-he is on stress dose steroids Solu-Cortef 50 every 8 which I will cut back to every 12 on 2/27 Has been cut back recently from 15 mg prednisone to 10 per brother Cocaine/EtOH/tobacco Elevated ALT >AST Brother relates he drinks occasionally patient states he drinks 4 beers a day This may account for his deranged LFTs-needs follow-up imaging if persistent Vocal cord malignancy Unclear where this was worked up will need outpatient follow-up Leukocytosis Community-acquired pneumonia/postobstructive  pneumonia Doxycycline as above monitor trends Leukocytosis could also be secondary to stress dosing of steroids  HTN Continue amlodipine 5 alone-holding losartan 25 Depression Continue citalopram 10 daily and might increase the dose to 20 mg as this would help him be more calm-adding Risperdal as above to help with agitation Reflux  No family present today hopefully will be going for procedure tomorrow discussed with pulmonary physician Dr. Shearon Stalls  Subjective:  Alcohol withdrawal scores ranging anywhere from 2-16 Seems like he pulled out IV multiple times and needed to be medicated overnight He is not willing or unable to talk clearly with me today and has a sitter   Consultants:   Neurology  Pulmonology  Objective: Vitals:   10/19/19 2331 10/20/19 0000 10/20/19 0133 10/20/19 0625  BP: (!) 198/96 (!) 178/79 (!) 178/79 (!) 169/76  Pulse: 78   91  Resp: 18   17  Temp: 98.2 F (36.8 C)   98.7 F (37.1 C)  TempSrc: Oral   Oral  SpO2: 95%     Weight:      Height:        Intake/Output Summary (Last 24 hours) at 10/20/2019 0719 Last data filed at 10/20/2019 0344 Gross per 24 hour  Intake 2974.3 ml  Output 2200 ml  Net 774.3 ml   Filed Weights   10/09/2019 2057  Weight: 70 kg    Examination:  eomi ncat no focal deficit to motor-moving all 4 limbs  cta b no addedf sound no rales no rhonchi Abd soft nt nd no rebound no guard bruising over extremitites  Scheduled Meds: . amLODipine  5 mg Oral Daily  . aspirin  81 mg Oral Daily  . enoxaparin (LOVENOX) injection  40 mg Subcutaneous Daily  . hydrocortisone sod  succinate (SOLU-CORTEF) inj  50 mg Intravenous Q12H  . LORazepam  1 mg Oral Q6H   Followed by  . LORazepam  0.5 mg Oral Q6H  . mometasone-formoterol  2 puff Inhalation BID  . pantoprazole  40 mg Oral Daily  . potassium chloride  40 mEq Oral BID  . rosuvastatin  5 mg Oral QPM  . umeclidinium bromide  1 puff Inhalation Daily   Continuous Infusions: .  doxycycline (VIBRAMYCIN) IV 100 mg (10/19/19 2054)  . thiamine injection 500 mg (10/19/19 2109)     LOS: 3 days   Time spent: [place], MD [facility]  10/20/2019, 7:19 AM

## 2019-10-20 NOTE — H&P (View-Only) (Signed)
NAME:  Drew Nguyen, MRN:  [id number], DOB:  1956/01/26, LOS: 3 ADMISSION DATE:  10/18/2019, CONSULTATION DATE:  10/17/2019 REFERRING MD:  Nita Sells, MD, CHIEF COMPLAINT:  Left lung mass  Brief History   [name] is a 64 year old male active smoker with medical history significant for left CVA, EtOH abuse, RA on immunosuppressants who presents with general tonic-clonic seizure. Of note, caregiver reported patient stopped drinking alcohol prior to these symptoms and was reportedly more confused and unable to walk steadily. admitted for concerns for seizure-like activity and incidentally found with left hilar mass. PCCM consulted for diagnosis of suspected lung cancer  Interim history/subjective:  Overnight more delirious, on CIWA. Has a sitter now. He is unable to participate in interview or consistently follow commands. Asking if he can take his restraints off.   Objective   Blood pressure (!) 155/74, pulse 86, temperature 99.3 F (37.4 C), temperature source Oral, resp. rate 16, height 5\' 6"  (1.676 m), weight 70 kg, SpO2 100 %.        Intake/Output Summary (Last 24 hours) at 10/20/2019 1445 Last data filed at 10/20/2019 1321 Gross per 24 hour  Intake 3094.3 ml  Output 2500 ml  Net 594.3 ml   Filed Weights   10/06/2019 2057  Weight: 70 kg    Examination: General: delirious, appears fatigued, hypoactive. Sitter in room.  Lungs: clear to auscultation, no wheezes, stable on room air Cardiovascular: RRR Abdomen: soft Extremities: no edema Neuro: delirious, in bilateral mitten restraints  Assessment & Plan:  Drew Nguyen is a 64 y.o. man who presents with:  Acute encephalopathy - unclear etiology, possible etoh withdrawal, substance use, delirium Left upper lobe mass with endobronchial collapse and hilar adenopathy  Plan: Tentative plan is for bronchoscopy  He is somewhat altered and not consentable today - I have spoken with and consented his brother. He will be  evaluated by anesthesia prior to proceeding with case tomorrow.  Discussed with Dr. Verlon Au.   Lenice Llamas, MD Pulmonary and [place] Pager: [place]   CBC: Recent Labs  Lab 10/03/2019 2104 10/17/19 0105 10/19/19 0445 10/20/19 0117  WBC 14.4* 13.0* 14.8* 17.0*  NEUTROABS  --   --  13.0* 15.3*  HGB 14.8 12.7* 12.5* 13.9  HCT 39.8 34.4* 36.2* 40.4  MCV 86.5 87.3 92.1 92.4  PLT 345 285 286 948    Basic Metabolic Panel: Recent Labs  Lab 10/19/2019 2238 10/17/19 0105 10/18/19 0631 10/18/19 1405 10/19/19 0445 10/20/19 0117 10/20/19 0822  NA  --    < > 133* 134* 137 139 137  K  --    < > 2.6* 3.0* 2.6* 3.1* 3.0*  CL  --    < > 95* 98 99 101 101  CO2  --    < > 27 27 28 26 27   GLUCOSE  --    < > 123* 130* 126* 131* 118*  BUN  --    < > 12 16 12 11 12   CREATININE  --    < > 0.64 0.65 0.91 0.57* 0.65  CALCIUM  --    < > 7.7* 7.8* 7.8* 8.2* 8.0*  MG 1.7  --  1.8  --   --   --   --    < > = values in this interval not displayed.   GFR: Estimated Creatinine Clearance: 84.2 mL/min (by C-G formula based on SCr of 0.65 mg/dL). Recent  Labs  Lab 10/10/2019 2104 10/17/19 0105 10/19/19 0445 10/20/19 0117  PROCALCITON  --  0.86  --   --   WBC 14.4* 13.0* 14.8* 17.0*    Liver Function Tests: Recent Labs  Lab 10/19/19 0445  AST 98*  ALT 212*  ALKPHOS 144*  BILITOT 0.8  PROT 5.3*  ALBUMIN 2.9*   No results for input(s): LIPASE, AMYLASE in the last 168 hours. No results for input(s): AMMONIA in the last 168 hours.  ABG No results found for: PHART, PCO2ART, PO2ART, HCO3, TCO2, ACIDBASEDEF, O2SAT   Coagulation Profile: Recent Labs  Lab 10/19/19 0445  INR 1.0    Cardiac Enzymes: No results for input(s): CKTOTAL, CKMB, CKMBINDEX, TROPONINI in the last 168 hours.  HbA1C: Hgb A1c MFr Bld  Date/Time Value Ref Range Status  06/06/2019 03:27 AM 5.4 4.8 - 5.6 % Final    Comment:    (NOTE) Pre  diabetes:          5.7%-6.4% Diabetes:              >6.4% Glycemic control for   <7.0% adults with diabetes   04/20/2019 05:32 AM 5.1 4.8 - 5.6 % Final    Comment:    (NOTE) Pre diabetes:          5.7%-6.4% Diabetes:              >6.4% Glycemic control for   <7.0% adults with diabetes     CBG: Recent Labs  Lab 10/05/2019 2106  GLUCAP 144*

## 2019-10-20 DEATH — deceased

## 2019-10-21 ENCOUNTER — Encounter (HOSPITAL_COMMUNITY): Admission: EM | Disposition: E | Payer: Self-pay | Source: Home / Self Care | Attending: Family Medicine

## 2019-10-21 ENCOUNTER — Inpatient Hospital Stay (HOSPITAL_COMMUNITY): Payer: Medicaid Other | Admitting: Certified Registered"

## 2019-10-21 ENCOUNTER — Encounter (HOSPITAL_COMMUNITY): Payer: Self-pay | Admitting: Internal Medicine

## 2019-10-21 DIAGNOSIS — C3492 Malignant neoplasm of unspecified part of left bronchus or lung: Secondary | ICD-10-CM | POA: Diagnosis present

## 2019-10-21 DIAGNOSIS — R590 Localized enlarged lymph nodes: Secondary | ICD-10-CM

## 2019-10-21 HISTORY — PX: BIOPSY: SHX5522

## 2019-10-21 HISTORY — PX: ENDOBRONCHIAL ULTRASOUND: SHX5096

## 2019-10-21 HISTORY — PX: VIDEO BRONCHOSCOPY WITH ENDOBRONCHIAL ULTRASOUND: SHX6177

## 2019-10-21 HISTORY — PX: BRONCHIAL NEEDLE ASPIRATION BIOPSY: SHX5106

## 2019-10-21 HISTORY — PX: BRONCHIAL BRUSHINGS: SHX5108

## 2019-10-21 LAB — HEPATITIS PANEL, ACUTE
HCV Ab: NONREACTIVE
Hep A IgM: NONREACTIVE
Hep B C IgM: NONREACTIVE
Hepatitis B Surface Ag: NONREACTIVE

## 2019-10-21 LAB — BASIC METABOLIC PANEL
Anion gap: 10 (ref 5–15)
BUN: 12 mg/dL (ref 8–23)
CO2: 25 mmol/L (ref 22–32)
Calcium: 7.9 mg/dL — ABNORMAL LOW (ref 8.9–10.3)
Chloride: 106 mmol/L (ref 98–111)
Creatinine, Ser: 0.63 mg/dL (ref 0.61–1.24)
GFR calc Af Amer: >60 mL/min (ref >60)
GFR calc non Af Amer: >60 mL/min (ref >60)
Glucose, Bld: 156 mg/dL — ABNORMAL HIGH (ref 70–99)
Potassium: 3.4 mmol/L — ABNORMAL LOW (ref 3.5–5.1)
Sodium: 141 mmol/L (ref 135–145)

## 2019-10-21 LAB — COMPREHENSIVE METABOLIC PANEL
ALT: 350 U/L — ABNORMAL HIGH (ref 0–44)
AST: 174 U/L — ABNORMAL HIGH (ref 15–41)
Albumin: 3.1 g/dL — ABNORMAL LOW (ref 3.5–5.0)
Alkaline Phosphatase: 219 U/L — ABNORMAL HIGH (ref 38–126)
Anion gap: 10 (ref 5–15)
BUN: 11 mg/dL (ref 8–23)
CO2: 25 mmol/L (ref 22–32)
Calcium: 7.8 mg/dL — ABNORMAL LOW (ref 8.9–10.3)
Chloride: 105 mmol/L (ref 98–111)
Creatinine, Ser: 0.58 mg/dL — ABNORMAL LOW (ref 0.61–1.24)
GFR calc Af Amer: >60 mL/min (ref >60)
GFR calc non Af Amer: >60 mL/min (ref >60)
Glucose, Bld: 174 mg/dL — ABNORMAL HIGH (ref 70–99)
Potassium: 2.7 mmol/L — CL (ref 3.5–5.1)
Sodium: 140 mmol/L (ref 135–145)
Total Bilirubin: 0.9 mg/dL (ref 0.3–1.2)
Total Protein: 5.3 g/dL — ABNORMAL LOW (ref 6.5–8.1)

## 2019-10-21 LAB — VITAMIN B12: Vitamin B-12: 349 pg/mL (ref 180–914)

## 2019-10-21 LAB — AMMONIA: Ammonia: 66 umol/L — ABNORMAL HIGH (ref 9–35)

## 2019-10-21 LAB — TSH: TSH: 0.213 u[IU]/mL — ABNORMAL LOW (ref 0.350–4.500)

## 2019-10-21 LAB — T4, FREE: Free T4: 0.61 ng/dL (ref 0.61–1.12)

## 2019-10-21 LAB — MAGNESIUM: Magnesium: 1.6 mg/dL — ABNORMAL LOW (ref 1.7–2.4)

## 2019-10-21 SURGERY — BRONCHOSCOPY, WITH EBUS
Anesthesia: General

## 2019-10-21 MED ORDER — POTASSIUM CHLORIDE 10 MEQ/100ML IV SOLN
10.0000 meq | INTRAVENOUS | Status: AC
  Administered 2019-10-21 (×4): 10 meq via INTRAVENOUS
  Filled 2019-10-21 (×4): qty 100

## 2019-10-21 MED ORDER — POTASSIUM CHLORIDE 2 MEQ/ML IV SOLN
INTRAVENOUS | Status: DC
  Filled 2019-10-21 (×8): qty 1000

## 2019-10-21 MED ORDER — LACTULOSE 10 GM/15ML PO SOLN
20.0000 g | Freq: Two times a day (BID) | ORAL | Status: DC
  Administered 2019-10-21 – 2019-10-25 (×8): 20 g via ORAL
  Filled 2019-10-21 (×8): qty 30

## 2019-10-21 MED ORDER — ROCURONIUM BROMIDE 10 MG/ML (PF) SYRINGE
PREFILLED_SYRINGE | INTRAVENOUS | Status: DC | PRN
  Administered 2019-10-21: 45 mg via INTRAVENOUS

## 2019-10-21 MED ORDER — FENTANYL CITRATE (PF) 100 MCG/2ML IJ SOLN
INTRAMUSCULAR | Status: DC | PRN
  Administered 2019-10-21: 100 ug via INTRAVENOUS

## 2019-10-21 MED ORDER — ONDANSETRON HCL 4 MG/2ML IJ SOLN: 4.0000 mg | Freq: Once | INTRAMUSCULAR | Status: DC | PRN

## 2019-10-21 MED ORDER — ONDANSETRON HCL 4 MG/2ML IJ SOLN
INTRAMUSCULAR | Status: DC | PRN
  Administered 2019-10-21: 4 mg via INTRAVENOUS

## 2019-10-21 MED ORDER — FENTANYL CITRATE (PF) 100 MCG/2ML IJ SOLN: 25.0000 ug | INTRAMUSCULAR | Status: DC | PRN

## 2019-10-21 MED ORDER — LIDOCAINE 2% (20 MG/ML) 5 ML SYRINGE
INTRAMUSCULAR | Status: DC | PRN
  Administered 2019-10-21: 60 mg via INTRAVENOUS

## 2019-10-21 MED ORDER — PROPOFOL 10 MG/ML IV BOLUS
INTRAVENOUS | Status: DC | PRN
  Administered 2019-10-21: 100 mg via INTRAVENOUS

## 2019-10-21 MED ORDER — HYDRALAZINE HCL 20 MG/ML IJ SOLN: 10.0000 mg | Freq: Four times a day (QID) | INTRAMUSCULAR | Status: DC | PRN

## 2019-10-21 MED ORDER — PREDNISONE 20 MG PO TABS
40.0000 mg | ORAL_TABLET | Freq: Every day | ORAL | Status: AC
  Administered 2019-10-21 – 2019-10-25 (×5): 40 mg via ORAL
  Filled 2019-10-21 (×4): qty 2

## 2019-10-21 MED ORDER — SODIUM CHLORIDE 0.9 % IV SOLN: INTRAVENOUS | Status: DC | PRN

## 2019-10-21 MED ORDER — DEXAMETHASONE SODIUM PHOSPHATE 10 MG/ML IJ SOLN
INTRAMUSCULAR | Status: DC | PRN
  Administered 2019-10-21: 5 mg via INTRAVENOUS

## 2019-10-21 NOTE — Interval H&P Note (Signed)
History and Physical Interval Note:  10/25/2019 11:34 AM  Drew Nguyen  has presented today for surgery, with the diagnosis of left upper lobe mass.  The various methods of treatment have been discussed with the patient and family. After consideration of risks, benefits and other options for treatment, the patient's brother has consented to  Procedure(s): VIDEO BRONCHOSCOPY WITH ENDOBRONCHIAL ULTRASOUND (N/A) as a surgical intervention.  The patient's history has been reviewed, patient examined, no change in status, stable for surgery.  I have reviewed the patient's chart and labs.  Questions were answered to the patient's satisfaction.     Spero Geralds

## 2019-10-21 NOTE — Op Note (Addendum)
Video Bronchoscopy with Endobronchial Ultrasound Procedure Note  Date of Operation: 11/02/2019  Pre-op Diagnosis: Left upper lobe lung mass  Post-op Diagnosis: left upper lobe lung mass  Surgeon: Spero Geralds  Anesthesia: General endotracheal anesthesia  Operation: Flexible video fiberoptic bronchoscopy with endobronchial ultrasound and biopsies.  Estimated Blood Loss: Minimal  Complications: none  Indications and History: Drew Nguyen is a 64 y.o. male with left upper lobe lung mass.  The risks, benefits, complications, treatment options and expected outcomes were discussed with the patient's brother  The possibilities of pneumothorax, pneumonia, reaction to medication, pulmonary aspiration, perforation of a viscus, bleeding, failure to diagnose a condition and creating a complication requiring transfusion or operation were discussed with the patient's brother who freely gave the consent over the phone.  Description of Procedure: The patient was examined in the preoperative area and history and data from the preprocedure consultation were reviewed. It was deemed appropriate to proceed.  The patient was taken to Endoscopy room 2, identified as Drew Nguyen and the procedure verified as Flexible Video Fiberoptic Bronchoscopy.  A Time Out was held and the above information confirmed. After being taken to the operating room general anesthesia was initiated and the patient  was orally intubated. The video fiberoptic bronchoscope was introduced via the endotracheal tube and a general inspection was performed which showed tortuous saber sheath trachea, normal right tracheobronchial tree. Extrinsic compression of the left upper lobe take off with very friable tissue - unable to intubate past this.  The left lower lobe take off was also narrowed and not intubated.  The standard scope was then withdrawn and the endobronchial ultrasound was used to identify and characterize the peritracheal, hilar and  bronchial lymph nodes. Inspection showed left upper lobe mass and 10L lymph node. Using real-time ultrasound guidance Wang needle biopsies were take from Station 10L node and left upper lobe mass and were sent for cytology. The diagnostic scope was then reintroduced and forceps biopsies were taken of the left upper lobe mass. Bronchial brushings were taken from the left upper lobe. The patient tolerated the procedure well without apparent complications. There was no significant blood loss. The bronchoscope was withdrawn. Anesthesia was reversed and the patient was taken to the PACU for recovery.   Samples: 1. Wang needle biopsies from 10L node and left upper lobe mass. 2. Forceps biopsies  from left upper lobe endobronchial lesion 3. Bronchial brushings from left upper lobe.  Plans:  Return to inpatient care. Pulmonary team will continue to follow.   Drew Llamas, MD Pulmonary and [place] Pager: [place]

## 2019-10-21 NOTE — Anesthesia Preprocedure Evaluation (Addendum)
Anesthesia Evaluation  Patient identified by MRN, date of birth, ID band Patient awake and Patient confused    Reviewed: Allergy & Precautions, NPO status , Patient's Chart, lab work & pertinent test results  Airway Mallampati: II  TM Distance: >3 FB Neck ROM: Full    Dental  (+) Edentulous Lower, Edentulous Upper   Pulmonary COPD,  COPD inhaler, Current Smoker and Patient abstained from smoking.,    + rhonchi  + decreased breath sounds      Cardiovascular hypertension, Pt. on medications Normal cardiovascular exam Rhythm:Regular Rate:Normal     Neuro/Psych Seizures -, Well Controlled,  PSYCHIATRIC DISORDERS Depression Dysphagia CVA, Residual Symptoms    GI/Hepatic GERD  ,(+)     substance abuse  cocaine use, Benzodiazepine   Endo/Other  Hyperlipidemia Osteoporosis  Renal/GU negative Renal ROS  negative genitourinary   Musculoskeletal  (+) Arthritis , Rheumatoid disorders,    Abdominal   Peds  Hematology Plavix therapy- last dose 7 days ago   Anesthesia Other Findings   Reproductive/Obstetrics                          Anesthesia Physical Anesthesia Plan  ASA: III  Anesthesia Plan: General   Post-op Pain Management:    Induction: Intravenous  PONV Risk Score and Plan: 2  Airway Management Planned: Oral ETT  Additional Equipment:   Intra-op Plan:   Post-operative Plan: Extubation in OR  Informed Consent: I have reviewed the patients History and Physical, chart, labs and discussed the procedure including the risks, benefits and alternatives for the proposed anesthesia with the patient or authorized representative who has indicated his/her understanding and acceptance.     Dental advisory given  Plan Discussed with: CRNA and Anesthesiologist  Anesthesia Plan Comments: (Patient complaining of chest pain on arrival to Endo. 12 lead EKG unchanged from 10/17/2019. Will proceed  with procedure.)       Anesthesia Quick Evaluation

## 2019-10-21 NOTE — Transfer of Care (Signed)
Immediate Anesthesia Transfer of Care Note  Patient: Drew Nguyen  Procedure(s) Performed: VIDEO BRONCHOSCOPY WITH ENDOBRONCHIAL ULTRASOUND (N/A ) BRONCHIAL NEEDLE ASPIRATION BIOPSIES BIOPSY BRONCHIAL BRUSHINGS  Patient Location: Endoscopy Unit  Anesthesia Type:General  Level of Consciousness: drowsy and patient cooperative  Airway & Oxygen Therapy: Patient Spontanous Breathing and Patient connected to nasal cannula oxygen  Post-op Assessment: Report given to RN, Post -op Vital signs reviewed and stable and Patient moving all extremities  Post vital signs: Reviewed and stable  Last Vitals:  Vitals Value Taken Time  BP 166/83 11/19/2019 1422  Temp 37 C 10/20/2019 1422  Pulse 90 11/12/2019 1423  Resp 28 11/07/2019 1423  SpO2 90 % 11/05/2019 1423  Vitals shown include unvalidated device data.  Last Pain:  Vitals:   10/20/2019 1422  TempSrc: Temporal  PainSc:       Patients Stated Pain Goal: (MD Shearon Stalls; MD Royce Macadamia) ([birth year] 1601)  Complications: No apparent anesthesia complications

## 2019-10-21 NOTE — Anesthesia Procedure Notes (Signed)
Procedure Name: Intubation Date/Time: 11/17/2019 12:23 PM Performed by: Moshe Salisbury, CRNA Pre-anesthesia Checklist: Patient identified, Emergency Drugs available, Suction available and Patient being monitored Patient Re-evaluated:Patient Re-evaluated prior to induction Oxygen Delivery Method: Circle System Utilized Preoxygenation: Pre-oxygenation with 100% oxygen Induction Type: IV induction Ventilation: Mask ventilation without difficulty Laryngoscope Size: Mac and 4 Tube type: Oral Tube size (mm): 9.5. Number of attempts: 1 Placement Confirmation: ETT inserted through vocal cords under direct vision,  positive ETCO2 and breath sounds checked- equal and bilateral Secured at: 21 cm Tube secured with: Tape Dental Injury: Teeth and Oropharynx as per pre-operative assessment

## 2019-10-21 NOTE — Progress Notes (Signed)
CRITICAL VALUE ALERT  Critical Value: K+--2.7  Date & Time Notied:  11/04/2019  8921  Provider Notified: M. Sharlet Salina  Orders Received/Actions taken: awaiting call back

## 2019-10-21 NOTE — Progress Notes (Addendum)
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  Vocal cord malignancy Presented to [facility] ED 10/17/2019 2/2 GTCS witnessed by home health aide Work-up = left hilar mass?  Adenopathy?  Malignancy sodium 120 2K2.7 WBC 14 Neurology consulted EEG was normal therefore signed off, pulmonology consulted  he has had metabolic encephalopathy through hospital stay which is waxed and waned-he is scheduled for bronchoscopy on 3/2 dependent on input from pulmonology and anesthesia  Data Reviewed:   Sodium 133-->139-->140 potassium 2.6-->3.1-->3.0-->2.7 Magnesium 1.6 AST/ALT up from 98/212-->174/350, bilirubin 0.9 BUN/creatinine stable 12/0.6--> 11/0.5 calcium 7.8  Assessment & Plan:  GTCS Metabolic encephalopathy B26 normal, TSH inappropriately suppressed but several days ago this was normal so would not work-up further Get ammonia as would have expected plan outpatient with more coherent His EEG this admit was negative for seizures then I would reconsult neurology to see  if his ammonia is no Elevated  then I would consult neurology to see if he is having PLEDs or any other issue and if he needs any other meds Hypothyroidism TSH checked on admission 0.3 and today 0.213-likely secondary to decreased peripheral  conversion to T4-->T3 2/2 EtOH- dependent on the same may need further work-up--given he has been on steroids this may affect all the HPA-axes Lung mass Scheduled for EBUS 11/05/2019-defer to anesthesia/pulmonary feasibility of the same  hypokalemia on admission Hypomagnesemia 1.6 on 3/2 Continue IV  fluid and 60 of K in addition to oral replacement 40 twice daily-received 4 rounds of K this morning IV-repeat potassium at 1300 Add magnesium 2 g now to see if this can replace his electrolytes Hypovolemic hyponatremia Improved and resolved CVA 03/2019, 05/2019 Plavix has been held for upcoming procedure for at least 5 days continue aspirin 81 mg RA on Plaquenil Plaquenil, Enbrel have been held at this time-stress dose Solu-Cortef now transition to once daily prednisone 40 Has been cut back recently from 15 mg prednisone to 10 per brother Cocaine/EtOH/tobacco Elevated ALT >AST Brother relates he drinks occasionally patient states he drinks 4 beers a day This may account for his deranged LFTs-holding Crestor at this time Repeat LFTs in a.m. and if further worsening would get imaging with ultrasound given mixed pattern of transaminitis and would suggest getting hepatitis panel in addition Vocal cord malignancy Unclear where this was worked up will need outpatient follow-up Leukocytosis Community-acquired pneumonia/postobstructive pneumonia Started doxycycline on 2/26-completed 3/2 Leukocytosis could also be secondary to stress dosing of steroids  HTN Continue amlodipine 5 alone-holding losartan 25 Not controlled in the systolics above 203 therefore at this time adding hydralazine 25 every 8 as needed blood pressure above 160 Depression Continue citalopram 10 daily and might increase the dose to 20 mg as this would help him be more calm-adding Risperdal as above to help with agitation Reflux    Subjective:  Alcohol scores appear to be between 0 and 8 and improved from prior he is intermittently making sense He is just back from procedure and is somewhat drowsy   Consultants:   Neurology  Pulmonology  Objective: Vitals:   10/20/19 1210 10/20/19 1743 10/20/19 2344 11/09/2019 5597  BP: (!) 155/74 (!) 167/82 (!) 191/95 (!) 192/89  Pulse: 86 87 79 83  Resp: 16 18 15 16   Temp:  99.3 F (37.4 C) 98.6 F (37 C) 98 F (36.7 C) 97.7 F (36.5 C)  TempSrc: Oral Oral Oral Oral  SpO2: 100% 93% 95% 93%  Weight:      Height:        Intake/Output Summary (Last 24 hours) at 10/26/2019 0759 Last data filed at 11/19/2019 1157 Gross per 24 hour  Intake 2664.81 ml  Output 700 ml  Net 1964.81 ml   Filed Weights   10/06/2019 2057  Weight: 70 kg    Examination:  Awake but slightly drowsy presumably because of sedation Chest is clear no added sound no rales no rhonchi Abdomen soft no rebound cannot appreciate organomegaly ROM intact lower extremity Neurologically follows commands to some degree but not making much sense  Scheduled Meds: . amLODipine  5 mg Oral Daily  . aspirin  81 mg Oral Daily  . enoxaparin (LOVENOX) injection  40 mg Subcutaneous Daily  . hydrocortisone sod succinate (SOLU-CORTEF) inj  50 mg Intravenous Q12H  . lidocaine  1 application Topical Once  . LORazepam  0.5 mg Oral Q6H  . mometasone-formoterol  2 puff Inhalation BID  . pantoprazole  40 mg Oral Daily  . potassium chloride  40 mEq Oral BID  . risperiDONE  1 mg Oral QHS  . rosuvastatin  5 mg Oral QPM  . umeclidinium bromide  1 puff Inhalation Daily   Continuous Infusions: . dextrose 5 % and 0.9% NaCl 1,000 mL with potassium chloride 60 mEq infusion    . doxycycline (VIBRAMYCIN) IV Stopped (10/20/19 2244)     LOS: 4 days   Time spent: [place], MD [facility]  11/13/2019, 7:59 AM

## 2019-10-21 NOTE — Interval H&P Note (Signed)
History and Physical Interval Note:  11/17/2019 12:13 PM  Drew Nguyen  has presented today for surgery, with the diagnosis of left upper lobe mass.  The various methods of treatment have been discussed with the patient and family. After consideration of risks, benefits and other options for treatment, the patient has consented to  Procedure(s): [place] (N/A) as a surgical intervention.  The patient's history has been reviewed, patient examined, no change in status, stable for surgery.  I have reviewed the patient's chart and labs.  Questions were answered to the patient's satisfaction.     Spero Geralds

## 2019-10-21 NOTE — Progress Notes (Signed)
ammonia high Stop all antipsychotics Continue for nowCIWA Treat with lactulose  Expect will improve over next several days  Verneita Griffes, MD [facility] 8:17 PM

## 2019-10-21 NOTE — Anesthesia Postprocedure Evaluation (Signed)
Anesthesia Post Note  Patient: Drew Nguyen  Procedure(s) Performed: VIDEO BRONCHOSCOPY WITH ENDOBRONCHIAL ULTRASOUND (N/A ) BRONCHIAL NEEDLE ASPIRATION BIOPSIES BIOPSY BRONCHIAL BRUSHINGS     Patient location during evaluation: PACU Anesthesia Type: General Level of consciousness: awake and alert and oriented Pain management: pain level controlled Vital Signs Assessment: post-procedure vital signs reviewed and stable Respiratory status: spontaneous breathing, nonlabored ventilation, respiratory function stable and patient connected to nasal cannula oxygen Cardiovascular status: blood pressure returned to baseline and stable Postop Assessment: no apparent nausea or vomiting Anesthetic complications: no    Last Vitals:  Vitals:   11/02/2019 1422 11/19/2019 1432  BP: (!) 166/83 (!) 154/116  Pulse: 90 99  Resp: (!) 29 (!) 25  Temp: 37 C   SpO2: 90% 96%    Last Pain:  Vitals:   11/17/2019 1432  TempSrc:   PainSc: Asleep                 Lani Havlik A.

## 2019-10-22 ENCOUNTER — Encounter (HOSPITAL_COMMUNITY): Payer: Self-pay | Admitting: Internal Medicine

## 2019-10-22 ENCOUNTER — Inpatient Hospital Stay (HOSPITAL_COMMUNITY): Payer: Medicaid Other

## 2019-10-22 ENCOUNTER — Telehealth: Payer: Self-pay | Admitting: General Surgery

## 2019-10-22 ENCOUNTER — Telehealth: Payer: Self-pay | Admitting: Internal Medicine

## 2019-10-22 DIAGNOSIS — J984 Other disorders of lung: Secondary | ICD-10-CM

## 2019-10-22 DIAGNOSIS — J449 Chronic obstructive pulmonary disease, unspecified: Secondary | ICD-10-CM

## 2019-10-22 LAB — HEPATIC FUNCTION PANEL
ALT: 326 U/L — ABNORMAL HIGH (ref 0–44)
AST: 131 U/L — ABNORMAL HIGH (ref 15–41)
Albumin: 3.2 g/dL — ABNORMAL LOW (ref 3.5–5.0)
Alkaline Phosphatase: 230 U/L — ABNORMAL HIGH (ref 38–126)
Bilirubin, Direct: 0.3 mg/dL — ABNORMAL HIGH (ref 0.0–0.2)
Indirect Bilirubin: 0.6 mg/dL (ref 0.3–0.9)
Total Bilirubin: 0.9 mg/dL (ref 0.3–1.2)
Total Protein: 5.8 g/dL — ABNORMAL LOW (ref 6.5–8.1)

## 2019-10-22 LAB — CULTURE, BLOOD (ROUTINE X 2)
Culture: NO GROWTH
Culture: NO GROWTH
Special Requests: ADEQUATE

## 2019-10-22 LAB — VITAMIN B1: Vitamin B1 (Thiamine): 107.7 nmol/L (ref 66.5–200.0)

## 2019-10-22 LAB — T3, FREE: T3, Free: 1.3 pg/mL — ABNORMAL LOW (ref 2.0–4.4)

## 2019-10-22 LAB — CYTOLOGY - NON PAP

## 2019-10-22 LAB — SURGICAL PATHOLOGY

## 2019-10-22 LAB — PHOSPHORUS: Phosphorus: 1.5 mg/dL — ABNORMAL LOW (ref 2.5–4.6)

## 2019-10-22 LAB — MAGNESIUM: Magnesium: 1.6 mg/dL — ABNORMAL LOW (ref 1.7–2.4)

## 2019-10-22 IMAGING — CT CT ABD-PELV W/ CM
2 of 4 series · 15 of 46 positions shown, 17 images · IV contrast (omnipaque)
Comparison: No priors.

CLINICAL DATA: 63-year-old male with history of adrenal mass
suspicious for malignancy. Follow-up study.

EXAM:
CT ABDOMEN AND PELVIS WITH CONTRAST
TECHNIQUE: Multidetector CT imaging of the abdomen and pelvis was performed
using the standard protocol following bolus administration of
intravenous contrast.
CONTRAST:  100mL OMNIPAQUE IOHEXOL 300 MG/ML  SOLN

[Series 6: coronal soft tissue · coronal · 0.79mm/px · 3 of 100 slices shown]
[im 34/100  soft-tissue]
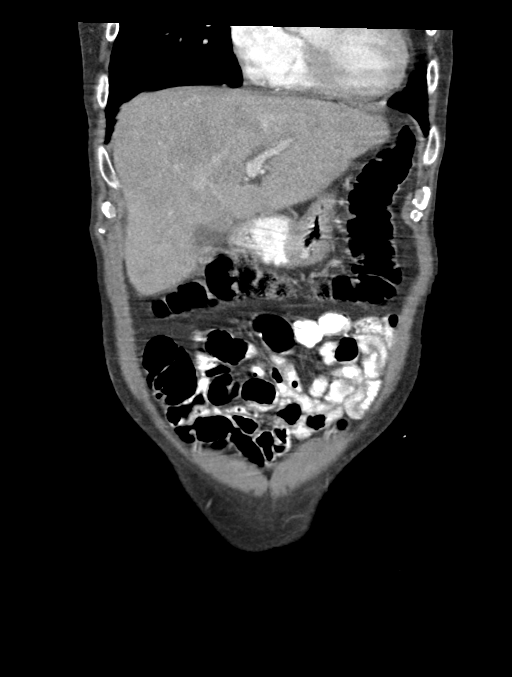
[im 45/100  soft-tissue]
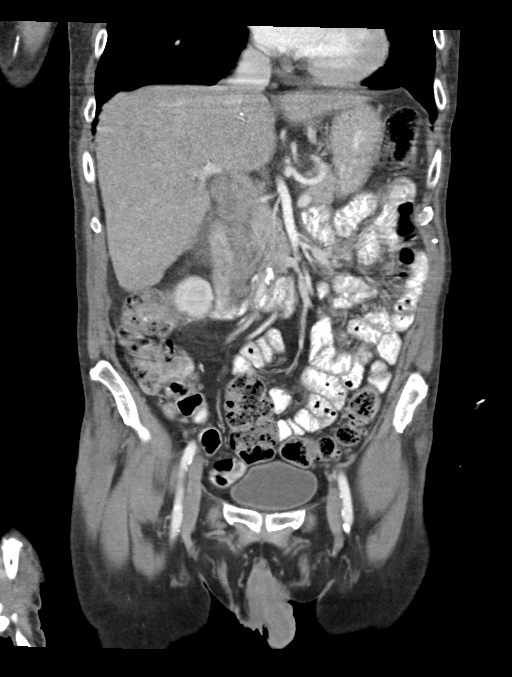
[im 56/100  soft-tissue]
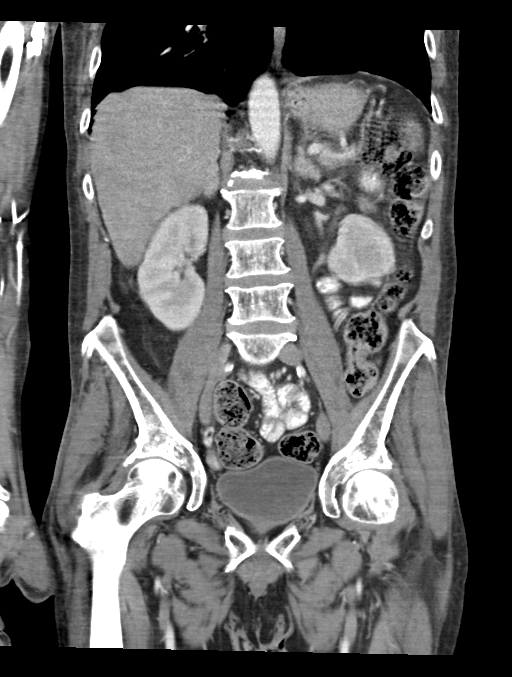

[Series 8: delays 5.0 i30f 2 · axial · 0.76mm/px · z∈[+1095,+1270]mm · 12 of 41 slices shown, 14 images]
[im 3/41  soft-tissue]
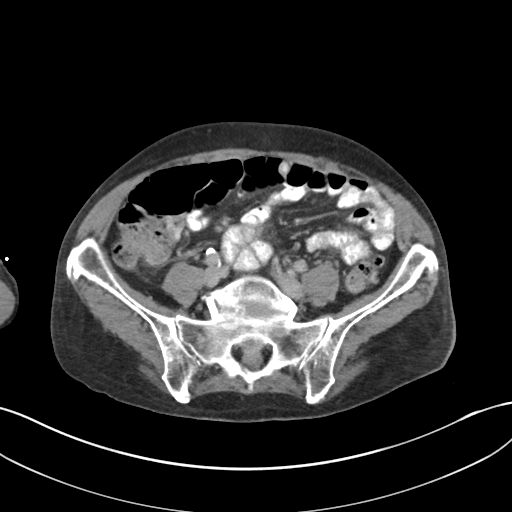
[im 3/41  bone]
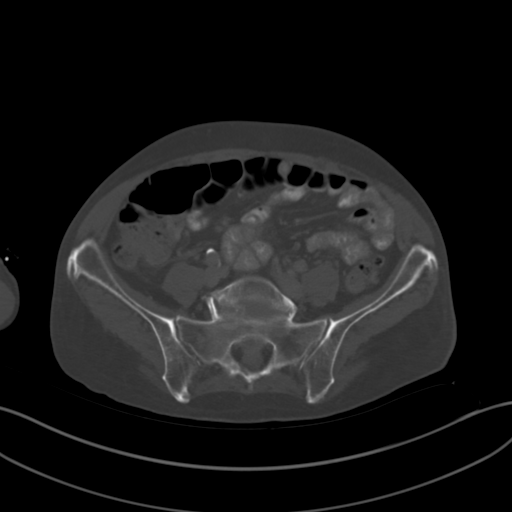
[im 7/41  soft-tissue]
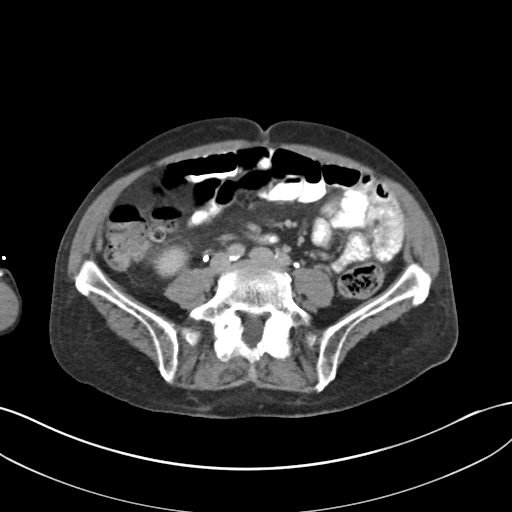
[im 9/41  soft-tissue]
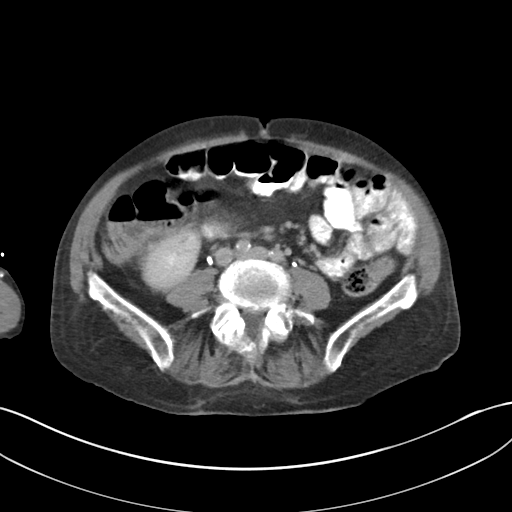
[im 13/41  soft-tissue]
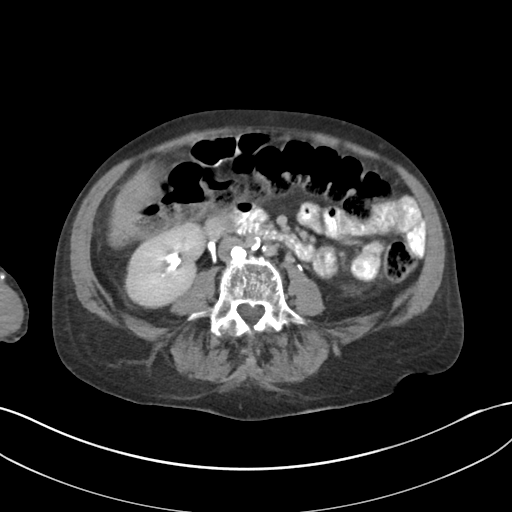
[im 15/41  soft-tissue]
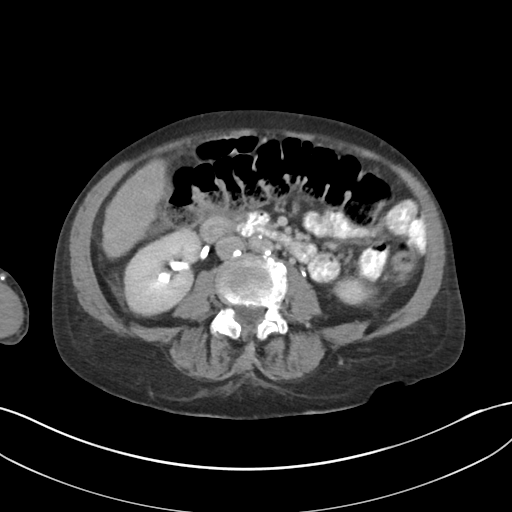
[im 19/41  soft-tissue]
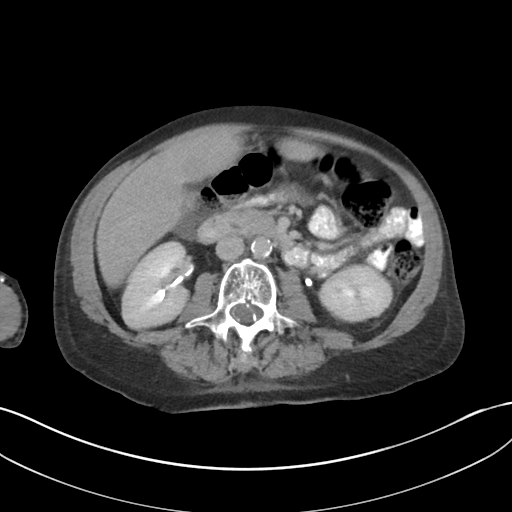
[im 22/41  soft-tissue]
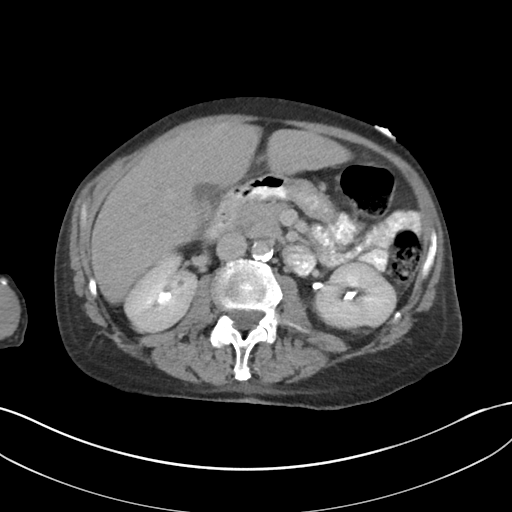
[im 26/41  soft-tissue]
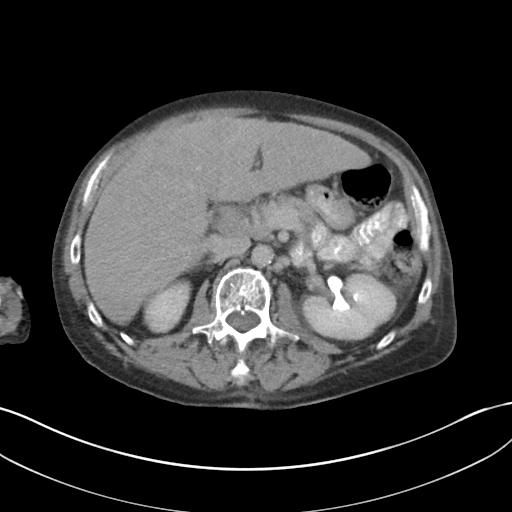
[im 28/41  soft-tissue]
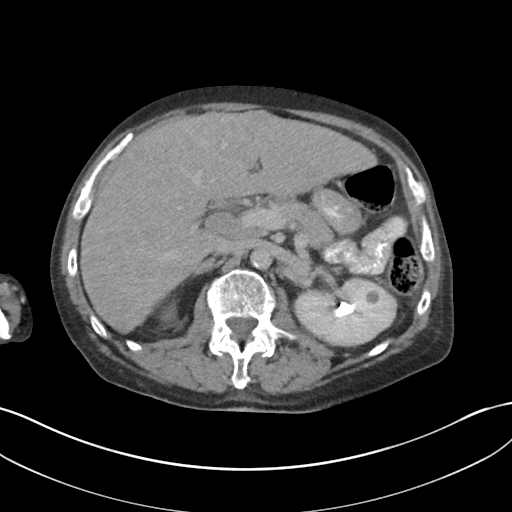
[im 28/41  bone]
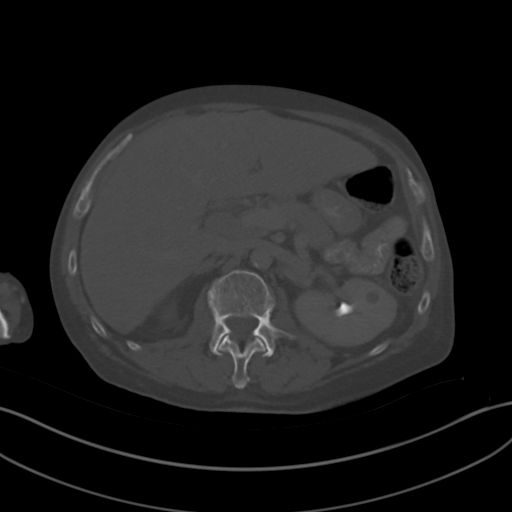
[im 32/41  soft-tissue]
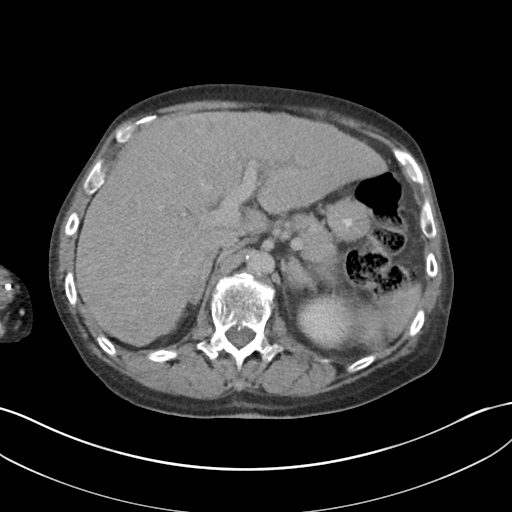
[im 34/41  soft-tissue]
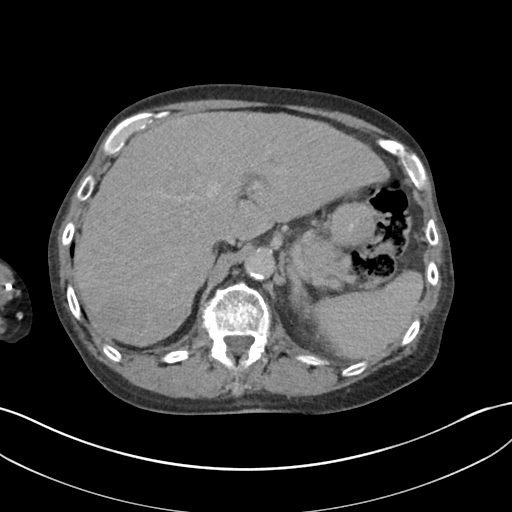
[im 38/41  soft-tissue]
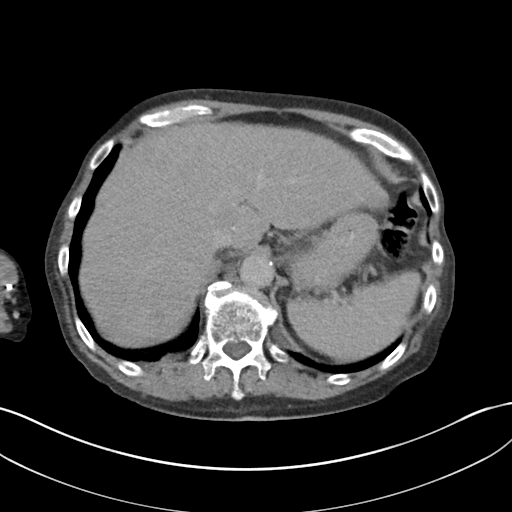

[15 of 46 positions shown; findings below may reference images not displayed]

FINDINGS: Lower chest: Atherosclerotic calcifications in the left circumflex
and right coronary arteries.

Hepatobiliary: Multiple ill-defined hypovascular lesions are
scattered throughout the hepatic parenchyma, highly concerning for
widespread metastatic disease. One specific example is the lesion at
the junction of segment 8 and the caudate lobe (axial image 19 of
series 3) measuring 3.2 x 2.1 cm. No intra or extrahepatic biliary
ductal dilatation. Gallbladder is nearly completely collapsed with
avid enhancement of the gallbladder mucosa and diffuse wall edema,
without surrounding inflammatory changes.

Pancreas: No pancreatic mass. No pancreatic ductal dilatation. No
pancreatic or peripancreatic fluid collections or inflammatory
changes.

Spleen: Unremarkable.

Adrenals/Urinary Tract: Subcentimeter low-attenuation lesions in
both kidneys, too small to characterize, but statistically likely to
represent cysts. No hydroureteronephrosis. Urinary bladder is normal
in appearance. Right adrenal gland is normal. 1.2 cm left adrenal
nodule (axial image 23 of series 3), similar to the prior chest CT
[DATE].

Stomach/Bowel: Normal appearance of the stomach. No pathologic
dilatation of small bowel or colon. Numerous colonic diverticulae
are noted, without surrounding inflammatory changes to suggest an
acute diverticulitis at this time. Normal appendix.

Vascular/Lymphatic: Aortic atherosclerosis, without evidence of
aneurysm or dissection in the abdominal or pelvic vasculature. No
lymphadenopathy noted in the abdomen or pelvis.

Reproductive: Prostate gland and seminal vesicles are unremarkable
in appearance.

Other: No significant volume of ascites.  No pneumoperitoneum.

Musculoskeletal: There are no aggressive appearing lytic or blastic
lesions noted in the visualized portions of the skeleton.
IMPRESSION: 1. Small left adrenal nodule is nonspecific. This could either be a
benign or malignant lesion.
2. Importantly, there are innumerable hypovascular areas in the
liver which are highly suspicious for metastatic disease. This could
be better evaluated with follow-up abdominal MRI with and without IV
gadolinium.
3. Colonic diverticulosis without evidence of acute diverticulitis
at this time.
4. Additional incidental findings, as above.

## 2019-10-22 MED ORDER — IOHEXOL 300 MG/ML  SOLN
100.0000 mL | Freq: Once | INTRAMUSCULAR | Status: AC | PRN
  Administered 2019-10-22: 100 mL via INTRAVENOUS

## 2019-10-22 MED ORDER — LOSARTAN POTASSIUM 50 MG PO TABS
25.0000 mg | ORAL_TABLET | Freq: Every day | ORAL | Status: DC
  Administered 2019-10-23 – 2019-10-24 (×2): 25 mg via ORAL
  Filled 2019-10-22 (×2): qty 1

## 2019-10-22 MED ORDER — AMLODIPINE BESYLATE 10 MG PO TABS
10.0000 mg | ORAL_TABLET | Freq: Every day | ORAL | Status: DC
  Administered 2019-10-23 – 2019-11-01 (×10): 10 mg via ORAL
  Filled 2019-10-22 (×10): qty 1

## 2019-10-22 NOTE — Telephone Encounter (Signed)
Drew Geralds, MD  [facility] Triage Pool; Valrie Hart, RN 2 hours ago (8:34 AM)   Hi Triage pool - please schedule for pulmonary clinic follow up in 3-4 weeks with myself. Drew Nguyen can you please arrange to have Drew Nguyen see oncology? He had an EBUS 3/1. Still inpatient so would expect soonest end of next week would be appropriate.   Since pt is still in hospital, scheduling HFU appt for pt so it will show up on AVS once pt is discharged.  Pt has been scheduled for HFU with Drew Nguyen Friday 3/26 at 1:30. Nothing further needed.

## 2019-10-22 NOTE — Progress Notes (Addendum)
NAME:  Drew Nguyen, MRN:  [id number], DOB:  1956-08-19, LOS: 5 ADMISSION DATE:  10/12/2019, CONSULTATION DATE:  10/17/2019 REFERRING MD:  Jennye Boroughs, MD, CHIEF COMPLAINT:  Left lung mass  Brief History   [name] is a 64 year old male active smoker with medical history significant for left CVA, EtOH abuse, RA on immunosuppressants who presents with general tonic-clonic seizure. Of note, caregiver reported patient stopped drinking alcohol prior to these symptoms and was reportedly more confused and unable to walk steadily. admitted for concerns for seizure-like activity and incidentally found with left hilar mass. PCCM consulted for diagnosis of suspected lung cancer  Interim history/subjective:  No overnight issues. Still delirious.   Objective   Blood pressure (!) 185/95, pulse 92, temperature 97.8 F (36.6 C), temperature source Oral, resp. rate 16, height 5\' 6"  (1.676 m), weight 70 kg, SpO2 93 %.        Intake/Output Summary (Last 24 hours) at 10/22/2019 1106 Last data filed at 10/22/2019 7026 Gross per 24 hour  Intake 1913.62 ml  Output 1700 ml  Net 213.62 ml   Filed Weights   10/08/2019 2057 10/20/2019 1126  Weight: 70 kg 70 kg    Examination: General: delirious, appears fatigued,  Lungs: clear to auscultation, no wheezes, stable on room air Cardiovascular: RRR Abdomen: soft Extremities: no edema Neuro: follows commands  Assessment & Plan:  Drew Nguyen is a 64 y.o. man who presents with:  Acute encephalopathy - unclear etiology, elevated ammonia, possible etoh withdrawal, substance use, delirium Left upper lobe mass with endobronchial collapse and hilar adenopathy POD 1 EBUS with [facility] for lung mass  Plan: Await pathology results Discussed plan for care with patient's brother [name] yesterday.  Will arrange for oncology follow up once results are back.   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 If his mental status is not clearing, would consider neurology consultation as paraneoplastic process from [facility] can present with encephalopathy. Paged Dr. Mal Misty and notified patient's brother [name] about results.    Labs   CBC: Recent Labs  Lab 10/18/2019 2104 10/17/19 0105 10/19/19 0445 10/20/19 0117  WBC 14.4* 13.0* 14.8* 17.0*  NEUTROABS  --   --  13.0* 15.3*  HGB 14.8 12.7* 12.5* 13.9  HCT 39.8 34.4* 36.2* 40.4  MCV 86.5 87.3 92.1 92.4  PLT 345 285 286 741    Basic Metabolic Panel: Recent Labs  Lab 10/12/2019 2238 10/17/19 0105 10/18/19 0631 10/18/19 1405 10/19/19 0445 10/20/19 0117 10/20/19 0822 11/10/2019 0126 11/19/2019 1603  NA  --    < > 133*   < > 137 139 137 140 141  K  --    < > 2.6*   < > 2.6* 3.1* 3.0* 2.7* 3.4*  CL  --    < > 95*   < > 99 101 101 105 106  CO2  --    < > 27   < > 28 26 27 25 25   GLUCOSE  --    < > 123*   < > 126* 131* 118* 174* 156*  BUN  --    < > 12   < > 12 11 12 11 12   CREATININE  --    < > 0.64   < > 0.91 0.57* 0.65 0.58* 0.63  CALCIUM  --    < > 7.7*   < > 7.8*  8.2* 8.0* 7.8* 7.9*  MG 1.7  --  1.8  --   --   --   --  1.6*  --    < > = values in this interval not displayed.   GFR: Estimated Creatinine Clearance: 84.2 mL/min (by C-G formula based on SCr of 0.63 mg/dL). Recent Labs  Lab 10/08/2019 2104 10/17/19 0105 10/19/19 0445 10/20/19 0117  PROCALCITON  --  0.86  --   --   WBC 14.4* 13.0* 14.8* 17.0*    Liver Function Tests: Recent Labs  Lab 10/19/19 0445 11/14/2019 0126  AST 98* 174*  ALT 212* 350*  ALKPHOS 144* 219*  BILITOT 0.8 0.9  PROT 5.3* 5.3*  ALBUMIN 2.9* 3.1*   No results for input(s): LIPASE, AMYLASE in the last 168 hours. Recent Labs  Lab 11/09/2019 1603  AMMONIA 66*    ABG No results found for: PHART, PCO2ART, PO2ART, HCO3, TCO2, ACIDBASEDEF, O2SAT   Coagulation Profile: Recent Labs  Lab 10/19/19 0445  INR 1.0     Cardiac Enzymes: No results for input(s): CKTOTAL, CKMB, CKMBINDEX, TROPONINI in the last 168 hours.  HbA1C: Hgb A1c MFr Bld  Date/Time Value Ref Range Status  06/06/2019 03:27 AM 5.4 4.8 - 5.6 % Final    Comment:    (NOTE) Pre diabetes:          5.7%-6.4% Diabetes:              >6.4% Glycemic control for   <7.0% adults with diabetes   04/20/2019 05:32 AM 5.1 4.8 - 5.6 % Final    Comment:    (NOTE) Pre diabetes:          5.7%-6.4% Diabetes:              >6.4% Glycemic control for   <7.0% adults with diabetes     CBG: Recent Labs  Lab 10/07/2019 2106  GLUCAP 144*

## 2019-10-22 NOTE — Telephone Encounter (Signed)
Patient needs follow up with oncology and pulmonary. Still inpatient.

## 2019-10-22 NOTE — Telephone Encounter (Signed)
Lorenso Courier,  I received call from Dr. Vic Ripper with pathology department. He wanted to speak with Dr. Shearon Stalls directly. I advised that you were at the hospital. However, I would contact you on your cell an send a page so that you could can contact him directly.   His cell # was [phone]. I was able to leave a message for Dr. Shearon Stalls on her cell and send a page with his contact number.  This message has been sent as a FYI just in case you did not get the phone message or page.

## 2019-10-22 NOTE — Telephone Encounter (Signed)
Thanks - I spoke with him.

## 2019-10-22 NOTE — Progress Notes (Addendum)
Progress Note    Drew Nguyen  CZY:[id number] DOB: 1956/03/27  DOA: 10/11/2019 PCP: Boyce Medici, FNP      Brief Narrative:    Medical records reviewed and are as summarized below:  Drew Nguyen is an 64 y.o. male 77 white male HTN, COPD stage II-III, cocaine/EtOH abuse, depression, rheumatoid arthritis on recently started on Plaquenil, left corona radiata CVA 03/2019 with repeat stroke 06/06/2019,?  Vocal cord malignancy.  He presented to [facility] ED 10/17/2019 secondary generalized tonic-clonic seizures witnessed by home health aide. Work-up = left hilar mass?  Adenopathy?  Malignancy sodium 120 2K2.7 WBC 14 Neurology consulted EEG was normal therefore signed off. Pulmonology consulted for evaluation of left hilar mass.  He has had acute toxic metabolic encephalopathy through hospital stay which has waxed and waned.  He underwent endobronchial ultrasound on 10/22/2019.  Pathology report showed small cell carcinoma.       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 Ordered CT abdomen and pelvis for staging.  Consult oncologist.  Follow-up with pulmonologist.  Generalized tonic-clonic seizures/acute toxic metabolic encephalopathy: U93 normal. Ammonia is elevated.  Continue lactulose.  If patient does not improve with lactulose, then we will consult neurology to consider LP to check for paraneoplastic syndrome related to small cell lung cancer.  Low TSH, low FT3: probably from euthyroid sick syndrome.  Outpatient follow-up of  TFTs.  Hypokalemia, hypomagnesemia and hypophosphatemia: Replete potassium, magnesium and phosphorus respectively.  Hyponatremia: Resolved   History of stroke in 03/2019, 05/2019: Plavix is on hold.  Continue aspirin.   Rheumatoid arthritis: Plaquenil, Enbrel have been held at this time-stress dose Solu-Cortef now transition to once daily prednisone 40 Has been cut back recently from 15 mg prednisone to 10 per brother  Cocaine/EtOH/tobacco/elevated liver enzymes: CT abdomen and pelvis has been ordered for further evaluation of liver enzymes.  History of vocal cord malignancy: Outpatient follow-up with oncologist   COPD: Compensated.  Continue bronchodilators  Leukocytosis: Monitor CBC.  Community-acquired pneumonia/postobstructive pneumonia: Started doxycycline on 2/26-completed 3/2   Hypertension/hypertensive urgency: BP is uncontrolled.  Increase amlodipine to 10 mg daily and resume losartan.  Depression: Continue Celexa and Risperdal       Body mass index is 24.91 kg/m.   Family Communication/Anticipated D/C date and plan/Code Status   DVT prophylaxis: SCDs Code Status: Full code Family Communication: Plan discussed with his brother at the bedside Disposition Plan: Patient is from home.  Plan to discharge patient home plan acute encephalopathy has resolved.      Subjective:   Patient is confused and unable to provide any history.  His brother is at the bedside and he says patient is becoming more alert over time.  Objective:    Vitals:   10/27/2019 2357 10/22/19 0527 10/22/19 0756 10/22/19 1237  BP:  (!) 185/95  (!) 176/89  Pulse: 91 82 92 82  Resp:  17 16 17   Temp:  97.8 F (36.6 C)  97.7 F (36.5 C)  TempSrc:  Oral  Oral  SpO2: 93%  96% 93% 91%  Weight:      Height:        Intake/Output Summary (Last 24 hours) at 10/22/2019 1549 Last data filed at 10/22/2019 1243 Gross per 24 hour  Intake 1613.62 ml  Output 2300 ml  Net -686.38 ml   Filed  Weights   10/03/2019 2057 11/15/2019 1126  Weight: 70 kg 70 kg    Exam:  GEN: NAD SKIN: He has multiple bruises on bilateral forearms EYES: EOMI ENT: MMM CV: RRR PULM: CTA B ABD: soft, ND, NT, +BS CNS: AAO x 1 (person only), non focal EXT: No edema or tenderness   Data Reviewed:   I have personally reviewed following labs and imaging studies:  Labs: Labs show the following:   Basic Metabolic Panel: Recent Labs  Lab 10/18/2019 2238 10/17/19 0105 10/18/19 0631 10/18/19 1405 10/19/19 0445 10/19/19 0445 10/20/19 0117 10/20/19 0117 10/20/19 3846 10/20/19 0822 11/05/2019 0126 11/18/2019 1603 10/22/19 0959  NA  --    < > 133*   < > 137  --  139  --  137  --  140 141  --   K  --    < > 2.6*   < > 2.6*   < > 3.1*   < > 3.0*   < > 2.7* 3.4*  --   CL  --    < > 95*   < > 99  --  101  --  101  --  105 106  --   CO2  --    < > 27   < > 28  --  26  --  27  --  25 25  --   GLUCOSE  --    < > 123*   < > 126*  --  131*  --  118*  --  174* 156*  --   BUN  --    < > 12   < > 12  --  11  --  12  --  11 12  --   CREATININE  --    < > 0.64   < > 0.91  --  0.57*  --  0.65  --  0.58* 0.63  --   CALCIUM  --    < > 7.7*   < > 7.8*  --  8.2*  --  8.0*  --  7.8* 7.9*  --   MG 1.7  --  1.8  --   --   --   --   --   --   --  1.6*  --  1.6*  PHOS  --   --   --   --   --   --   --   --   --   --   --   --  1.5*   < > = values in this interval not displayed.   GFR Estimated Creatinine Clearance: 84.2 mL/min (by C-G formula based on SCr of 0.63 mg/dL). Liver Function Tests: Recent Labs  Lab 10/19/19 0445 11/19/2019 0126 10/22/19 0959  AST 98* 174* 131*  ALT 212* 350* 326*  ALKPHOS 144* 219* 230*  BILITOT 0.8 0.9 0.9  PROT 5.3* 5.3* 5.8*  ALBUMIN 2.9* 3.1* 3.2*   No results for input(s): LIPASE, AMYLASE in the last 168 hours. Recent Labs  Lab 11/15/2019 1603  AMMONIA 66*   Coagulation profile Recent Labs  Lab 10/19/19 0445  INR 1.0    CBC: Recent Labs  Lab  09/23/2019 2104  10/17/19 0105 10/19/19 0445 10/20/19 0117  WBC 14.4* 13.0* 14.8* 17.0*  NEUTROABS  --   --  13.0* 15.3*  HGB 14.8 12.7* 12.5* 13.9  HCT 39.8 34.4* 36.2* 40.4  MCV 86.5 87.3 92.1 92.4  PLT 345 285 286 260   Cardiac Enzymes: No results for input(s): CKTOTAL, CKMB, CKMBINDEX, TROPONINI in the last 168 hours. BNP (last 3 results) No results for input(s): PROBNP in the last 8760 hours. CBG: Recent Labs  Lab 10/02/2019 2106  GLUCAP 144*   D-Dimer: No results for input(s): DDIMER in the last 72 hours. Hgb A1c: No results for input(s): HGBA1C in the last 72 hours. Lipid Profile: No results for input(s): CHOL, HDL, LDLCALC, TRIG, CHOLHDL, LDLDIRECT in the last 72 hours. Thyroid function studies: Recent Labs    11/19/2019 0126 10/28/2019 1603  TSH 0.213*  --   T3FREE  --  1.3*   Anemia work up: Recent Labs    11/11/2019 0126  VITAMINB12 349   Sepsis Labs: Recent Labs  Lab 09/27/2019 2104 10/17/19 0105 10/19/19 0445 10/20/19 0117  PROCALCITON  --  0.86  --   --   WBC 14.4* 13.0* 14.8* 17.0*    Microbiology Recent Results (from the past 240 hour(s))  SARS CORONAVIRUS 2 (TAT 6-24 HRS) Nasopharyngeal Nasopharyngeal Swab     Status: None   Collection Time: 09/30/2019 11:59 PM   Specimen: Nasopharyngeal Swab  Result Value Ref Range Status   SARS Coronavirus 2 NEGATIVE NEGATIVE Final    Comment: (NOTE) SARS-CoV-2 target nucleic acids are NOT DETECTED. The SARS-CoV-2 RNA is generally detectable in upper and lower respiratory specimens during the acute phase of infection. Negative results do not preclude SARS-CoV-2 infection, do not rule out co-infections with other pathogens, and should not be used as the sole basis for treatment or other patient management decisions. Negative results must be combined with clinical observations, patient history, and epidemiological information. The expected result is Negative. Fact Sheet for  Patients: SugarRoll.be Fact Sheet for Healthcare Providers: https://www.woods-mathews.com/ This test is not yet approved or cleared by the [place] FDA and  has been authorized for detection and/or diagnosis of SARS-CoV-2 by FDA under an Emergency Use Authorization (EUA). This EUA will remain  in effect (meaning this test can be used) for the duration of the COVID-19 declaration under Section 56 4(b)(1) of the Act, 21 U.S.C. section 360bbb-3(b)(1), unless the authorization is terminated or revoked sooner. Performed at [facility], [place] [address], [place], [place] [address]   Culture, blood (Routine X 2) w Reflex to ID Panel     Status: None   Collection Time: 10/17/19  3:54 AM   Specimen: BLOOD  Result Value Ref Range Status   Specimen Description BLOOD LEFT HAND  Final   Special Requests   Final    BOTTLES DRAWN AEROBIC ONLY Blood Culture adequate volume   Culture   Final    NO GROWTH 5 DAYS Performed at [facility], [address], [place], [place] [address]    Report Status 10/22/2019 FINAL  Final  Culture, blood (Routine X 2) w Reflex to ID Panel     Status: None   Collection Time: 10/17/19  3:54 AM   Specimen: BLOOD  Result Value Ref Range Status   Specimen Description BLOOD RIGHT HAND  Final   Special Requests   Final    BOTTLES DRAWN AEROBIC ONLY Blood Culture results may not be optimal due to an inadequate  volume of blood received in culture bottles   Culture   Final    NO GROWTH 5 DAYS Performed at [facility], [place] [address] [place], [place] [address]    Report Status 10/22/2019 FINAL  Final    Procedures and diagnostic studies:  No results found.  Medications:   . [START ON 10/23/2019] amLODipine  10 mg Oral Daily  . aspirin  81 mg Oral Daily  . enoxaparin (LOVENOX) injection  40 mg Subcutaneous Daily  . lactulose  20 g Oral BID  . losartan  25 mg Oral Daily  . mometasone-formoterol  2 puff  Inhalation BID  . pantoprazole  40 mg Oral Daily  . potassium chloride  40 mEq Oral BID  . predniSONE  40 mg Oral QAC breakfast  . risperiDONE  1 mg Oral QHS  . umeclidinium bromide  1 puff Inhalation Daily   Continuous Infusions: . dextrose 5 % and 0.9% NaCl 1,000 mL with potassium chloride 60 mEq infusion 100 mL/hr at 10/22/19 1526     LOS: 5 days   Georgina Krist  [facility]     10/22/2019, 3:49 PM

## 2019-10-22 NOTE — Progress Notes (Signed)
Assumed care from Vito Berger, RN. Patient Less agitated per RN. Will continue to monitor.

## 2019-10-23 ENCOUNTER — Encounter (HOSPITAL_COMMUNITY): Payer: Self-pay | Admitting: Internal Medicine

## 2019-10-23 ENCOUNTER — Telehealth: Payer: Self-pay | Admitting: Adult Health

## 2019-10-23 DIAGNOSIS — R627 Adult failure to thrive: Secondary | ICD-10-CM

## 2019-10-23 DIAGNOSIS — K7200 Acute and subacute hepatic failure without coma: Secondary | ICD-10-CM

## 2019-10-23 DIAGNOSIS — C3492 Malignant neoplasm of unspecified part of left bronchus or lung: Secondary | ICD-10-CM

## 2019-10-23 DIAGNOSIS — D849 Immunodeficiency, unspecified: Secondary | ICD-10-CM

## 2019-10-23 LAB — CBC WITH DIFFERENTIAL/PLATELET
Abs Immature Granulocytes: 0.42 10*3/uL — ABNORMAL HIGH (ref 0.00–0.07)
Basophils Absolute: 0.1 10*3/uL (ref 0.0–0.1)
Basophils Relative: 0 %
Eosinophils Absolute: 0 10*3/uL (ref 0.0–0.5)
Eosinophils Relative: 0 %
HCT: 38.9 % — ABNORMAL LOW (ref 39.0–52.0)
Hemoglobin: 13.3 g/dL (ref 13.0–17.0)
Immature Granulocytes: 3 %
Lymphocytes Relative: 5 %
Lymphs Abs: 0.8 10*3/uL (ref 0.7–4.0)
MCH: 31.4 pg (ref 26.0–34.0)
MCHC: 34.2 g/dL (ref 30.0–36.0)
MCV: 92 fL (ref 80.0–100.0)
Monocytes Absolute: 0.7 10*3/uL (ref 0.1–1.0)
Monocytes Relative: 4 %
Neutro Abs: 15 10*3/uL — ABNORMAL HIGH (ref 1.7–7.7)
Neutrophils Relative %: 88 %
Platelets: 208 10*3/uL (ref 150–400)
RBC: 4.23 MIL/uL (ref 4.22–5.81)
RDW: 12.8 % (ref 11.5–15.5)
WBC: 17.1 10*3/uL — ABNORMAL HIGH (ref 4.0–10.5)
nRBC: 0.2 % (ref 0.0–0.2)

## 2019-10-23 LAB — COMPREHENSIVE METABOLIC PANEL
ALT: 521 U/L — ABNORMAL HIGH (ref 0–44)
AST: 290 U/L — ABNORMAL HIGH (ref 15–41)
Albumin: 3.3 g/dL — ABNORMAL LOW (ref 3.5–5.0)
Alkaline Phosphatase: 319 U/L — ABNORMAL HIGH (ref 38–126)
Anion gap: 14 (ref 5–15)
BUN: 11 mg/dL (ref 8–23)
CO2: 22 mmol/L (ref 22–32)
Calcium: 8.3 mg/dL — ABNORMAL LOW (ref 8.9–10.3)
Chloride: 107 mmol/L (ref 98–111)
Creatinine, Ser: 0.62 mg/dL (ref 0.61–1.24)
GFR calc Af Amer: >60 mL/min (ref >60)
GFR calc non Af Amer: >60 mL/min (ref >60)
Glucose, Bld: 188 mg/dL — ABNORMAL HIGH (ref 70–99)
Potassium: 3.5 mmol/L (ref 3.5–5.1)
Sodium: 143 mmol/L (ref 135–145)
Total Bilirubin: 1.1 mg/dL (ref 0.3–1.2)
Total Protein: 5.8 g/dL — ABNORMAL LOW (ref 6.5–8.1)

## 2019-10-23 LAB — AMMONIA: Ammonia: 67 umol/L — ABNORMAL HIGH (ref 9–35)

## 2019-10-23 LAB — LACTATE DEHYDROGENASE: LDH: 992 U/L — ABNORMAL HIGH (ref 98–192)

## 2019-10-23 LAB — PHOSPHORUS: Phosphorus: 1.5 mg/dL — ABNORMAL LOW (ref 2.5–4.6)

## 2019-10-23 LAB — MAGNESIUM: Magnesium: 1.5 mg/dL — ABNORMAL LOW (ref 1.7–2.4)

## 2019-10-23 MED ORDER — POTASSIUM CHLORIDE 20 MEQ PO PACK
40.0000 meq | PACK | Freq: Every day | ORAL | Status: DC
  Administered 2019-10-24: 40 meq via ORAL
  Filled 2019-10-23 (×2): qty 2

## 2019-10-23 MED ORDER — POTASSIUM & SODIUM PHOSPHATES 280-160-250 MG PO PACK
1.0000 | PACK | Freq: Three times a day (TID) | ORAL | Status: DC
  Administered 2019-10-23 – 2019-10-31 (×21): 1 via ORAL
  Filled 2019-10-23 (×34): qty 1

## 2019-10-23 MED ORDER — MAGNESIUM SULFATE 2 GM/50ML IV SOLN
2.0000 g | Freq: Once | INTRAVENOUS | Status: AC
  Administered 2019-10-23: 2 g via INTRAVENOUS
  Filled 2019-10-23: qty 50

## 2019-10-23 MED ORDER — CLOPIDOGREL BISULFATE 75 MG PO TABS
75.0000 mg | ORAL_TABLET | Freq: Every day | ORAL | Status: DC
  Administered 2019-10-24 – 2019-10-25 (×2): 75 mg via ORAL
  Filled 2019-10-23 (×3): qty 1

## 2019-10-23 NOTE — Progress Notes (Signed)
Patient is now seeing oncology inpatient. I have set up outpatient pulmonary appointment for him as well.  We will see as needed right now but please don't hesitate to contact us if we can be of further assistance.  Lenice Llamas, MD Pulmonary and [place] Pager: [phone]

## 2019-10-23 NOTE — Progress Notes (Addendum)
PROGRESS NOTE  Drew Nguyen FYT:[id number] DOB: 1956-06-08 DOA: 09/30/2019 PCP: Boyce Medici, FNP   Brief history:  Drew Nguyen is an 64 y.o. male 34 white male HTN, COPD stage II-III, cocaine/EtOH abuse, depression, rheumatoid arthritis on recently started on Plaquenil, left corona radiata CVA 03/2019 with repeat stroke 06/06/2019,? Vocal cord malignancy.  He presented to [facility] ED 10/17/2019 secondary generalized tonic-clonic seizures witnessed by home health aide. Work-up = left hilar mass? Adenopathy? Malignancy sodium 120 2K2.7 WBC 14 Neurology consultedEEG was normal therefore signed off. Pulmonology consulted for evaluation of left hilar mass. He has had acute toxic metabolic encephalopathy through hospital stay which has waxed and waned.  He underwent endobronchial ultrasound on 11/11/2019.  Pathology report showed small cell carcinoma.  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  Mri brain no mass CT abdomen and pelvis + liver masses, Small left adrenal nodule  -Oncology consulted, case discussed with oncology Dr. Maylon Peppers  Addendum: Rad[facility]c would like patient to be transferred to [facility] for simulation tomorrow, transfer to [facility] order placed, flowmanager informed.  Addendum: Oncology recommended palliative care consult, consult order placed.  Community-acquired pneumonia/postobstructive pneumonia: Started  doxycycline on 2/26-completed 3/2  Liver mass/hepatic encephalopathy -Hepatitis panel negative -paraneoplastic syndrome could be on differential that contribute to encephalopathy, Case discussed with oncology Dr. Maylon Peppers who will consult -started lactulose  Hyponatremia:suspect beer potomania ,   Resolved  Generalized tonic-clonic seizures/acute toxic metabolic encephalopathy (presenting symptom) EEG: This study is within normal limits. No seizures or epileptiform discharges were seen throughout the recording.  MR brain: 1. Remote nonhemorrhagic lacunar infarcts of the basal ganglia bilaterally extending into the corona radiata. 2. Atrophy and white matter disease are mildly advanced for age. 3. No acute or focal intracranial abnormality to explain seizures or confusion. Neurology consulted,  Suspected provoked seizure in setting of alcohol withdrawal hyponatremia No seizure for the last several days.  Mental status improved. Neurology signed off  Hypokalemia, hypomagnesemia and hypophosphatemia Remain low , continue replace , repeat labs  Keep on telemetry   Hypertension/hypertensive urgency: BP is uncontrolled.  Increase amlodipine to 10 mg daily and resume losartan.  History of stroke in 03/2019, 05/2019: Plavix is on hold due to needing biopsy.  resumed. ldl 53, he is not on statin.   Rheumatoid arthritis/chronic immunosuppressed status, patient has been on prednisone 50 mg daily for years: Plaquenil, Enbrel have been held at this time-stress dose Solu-Cortef now transition to once daily prednisone 40 Has been cut back recently from 15 mg prednisone to 10 per brother   Sterile polysubstance abuse :cocaine/EtOH/tobacco H/o vocal cord malignancy  COPD, compensated, no wheezing, no hypoxia, continue bronchodilator  Depression: Continue Celexa and Risperdal  FTT, get PT, SNF placement  DVT Prophylaxis: Subcu Lovenox 40 mg daily  Code Status: Full  Family Communication:  Brother David at bedside, patient desires [name] to be his healthcare power of attorney, [name] agrees  to  Disposition Plan:    Patient came from:                        home                                                                                  Anticipated d/c place: SNF  Barriers to d/c OR conditions which need to be met to effect a safe d/c:  Need oncology/rad onc clearance.  need palliative care consult   Consultants:  Pulmonary critical care  Oncology  Radiation oncology  Neurology  Palliative care  Procedures:  EEG Flexible video fiberoptic bronchoscopy with endobronchial ultrasound and biopsies on March 2 by Dr. Shearon Stalls  Antibiotics:  Doxycycline, 2/26-completed 3/2   Objective: BP (!) 162/84 (BP Location: Right Arm)   Pulse 86   Temp 97.7 F (36.5 C) (Oral)   Resp 18   Ht 5' 6"  (1.676 m)   Wt 70 kg   SpO2 94%   BMI 24.91 kg/m   Intake/Output Summary (Last 24 hours) at 10/23/2019 1050 Last data filed at 10/23/2019 0517 Gross per 24 hour  Intake --  Output 3200 ml  Net -3200 ml   Filed Weights   09/22/2019 2057 10/22/2019 1126  Weight: 70 kg 70 kg    Exam: Patient is examined daily including today on 10/23/2019, exams remain the same as of yesterday except that has changed    General: Weak, chronic ill-appearing, alert, not oriented to time  Cardiovascular: RRR  Respiratory: CTABL  Abdomen: Soft/ND/NT, positive BS  Musculoskeletal: No Edema  Neuro: alert, oriented to person and place, not to time  Skin: Scattered ecchymosis mostly upper extremities  Data Reviewed: Basic Metabolic Panel: Recent Labs  Lab 09/29/2019 2238 10/17/19 0105 10/18/19 0631 10/18/19 1405 10/19/19 0445 10/20/19 0117 10/20/19 5397 11/04/2019 0126 11/04/2019 1603 10/22/19 0959  NA  --    < > 133*   < > 137 139 137 140 141  --   K  --    < > 2.6*   < > 2.6* 3.1* 3.0* 2.7* 3.4*  --   CL  --    < > 95*   < > 99 101 101 105 106  --   CO2  --    < > 27   < > 28 26  27 25 25   --   GLUCOSE  --    < > 123*   < > 126* 131* 118* 174* 156*  --   BUN  --    < > 12   < > 12 11 12 11 12   --   CREATININE  --    < > 0.64   < > 0.91 0.57* 0.65 0.58* 0.63  --   CALCIUM  --    < > 7.7*   < > 7.8* 8.2* 8.0* 7.8* 7.9*  --   MG 1.7  --  1.8  --   --   --   --  1.6*  --  1.6*  PHOS  --   --   --   --   --   --   --   --   --  1.5*   < > = values in this interval not displayed.   Liver Function Tests: Recent Labs  Lab 10/19/19 0445 10/29/2019 0126 10/22/19 0959  AST 98* 174* 131*  ALT 212* 350* 326*  ALKPHOS 144* 219* 230*  BILITOT 0.8 0.9 0.9  PROT 5.3* 5.3* 5.8*  ALBUMIN 2.9* 3.1* 3.2*   No results for input(s): LIPASE, AMYLASE in the last 168 hours. Recent Labs  Lab 10/24/2019 1603  AMMONIA 66*   CBC: Recent Labs  Lab 10/04/2019 2104 10/17/19 0105 10/19/19 0445 10/20/19 0117  WBC 14.4* 13.0* 14.8* 17.0*  NEUTROABS  --   --  13.0* 15.3*  HGB 14.8 12.7* 12.5* 13.9  HCT 39.8 34.4* 36.2* 40.4  MCV 86.5 87.3 92.1 92.4  PLT 345 285 286 260   Cardiac Enzymes:   No results for input(s): CKTOTAL, CKMB, CKMBINDEX, TROPONINI in the last 168 hours. BNP (last 3 results) No results for input(s): BNP in the last 8760 hours.  ProBNP (last 3 results) No results for input(s): PROBNP in the last 8760 hours.  CBG: Recent Labs  Lab 10/15/2019 2106  GLUCAP 144*    Recent Results (from the past 240 hour(s))  SARS CORONAVIRUS 2 (TAT 6-24 HRS) Nasopharyngeal Nasopharyngeal Swab     Status: None   Collection Time: 10/05/2019 11:59 PM   Specimen: Nasopharyngeal Swab  Result Value Ref Range Status   SARS Coronavirus 2 NEGATIVE NEGATIVE Final    Comment: (NOTE) SARS-CoV-2 target nucleic acids are NOT DETECTED. The SARS-CoV-2 RNA is generally detectable in upper and lower respiratory specimens during the acute phase of infection. Negative results do not preclude SARS-CoV-2 infection, do not rule out co-infections with other pathogens, and should not be used as  the sole basis for treatment or other patient management decisions. Negative results must be combined with clinical observations, patient history, and epidemiological information. The expected result is Negative. Fact Sheet for Patients: SugarRoll.be Fact Sheet for Healthcare Providers: https://www.woods-mathews.com/ This test is not yet approved or cleared by the [place] FDA and  has been authorized for detection and/or diagnosis of SARS-CoV-2 by FDA under an Emergency Use Authorization (EUA). This EUA will remain  in effect (meaning this test can be used) for the duration of the COVID-19 declaration under Section 56 4(b)(1) of the Act, 21 U.S.C. section 360bbb-3(b)(1), unless the authorization is terminated or revoked sooner. Performed at [facility], [place] [address], [place], [place] [address]   Culture, blood (Routine X 2) w Reflex to ID Panel     Status: None   Collection Time: 10/17/19  3:54 AM   Specimen: BLOOD  Result Value Ref Range Status   Specimen Description BLOOD LEFT HAND  Final   Special Requests   Final    BOTTLES DRAWN AEROBIC ONLY Blood Culture adequate volume   Culture   Final    NO GROWTH 5 DAYS Performed at [facility], [address], [place], [place] [address]    Report Status 10/22/2019 FINAL  Final  Culture, blood (Routine X 2) w Reflex to ID Panel     Status: None   Collection Time: 10/17/19  3:54 AM   Specimen: BLOOD  Result Value Ref Range Status   Specimen Description BLOOD RIGHT HAND  Final   Special Requests   Final    BOTTLES DRAWN AEROBIC ONLY Blood Culture results may not be optimal due to an inadequate volume of blood received in culture bottles   Culture   Final  NO GROWTH 5 DAYS Performed at [facility], [place] [address], [place], [place] [address]    Report Status 10/22/2019 FINAL  Final     Studies: CT ABDOMEN PELVIS W CONTRAST  Result Date: 10/22/2019 CLINICAL  DATA:  64 year old male with history of adrenal mass suspicious for malignancy. Follow-up study. EXAM: CT ABDOMEN AND PELVIS WITH CONTRAST TECHNIQUE: Multidetector CT imaging of the abdomen and pelvis was performed using the standard protocol following bolus administration of intravenous contrast. CONTRAST:  163m OMNIPAQUE IOHEXOL 300 MG/ML  SOLN COMPARISON:  No priors. FINDINGS: Lower chest: Atherosclerotic calcifications in the left circumflex and right coronary arteries. Hepatobiliary: Multiple ill-defined hypovascular lesions are scattered throughout the hepatic parenchyma, highly concerning for widespread metastatic disease. One specific example is the lesion at the junction of segment 8 and the caudate lobe (axial image 19 of series 3) measuring 3.2 x 2.1 cm. No intra or extrahepatic biliary ductal dilatation. Gallbladder is nearly completely collapsed with avid enhancement of the gallbladder mucosa and diffuse wall edema, without surrounding inflammatory changes. Pancreas: No pancreatic mass. No pancreatic ductal dilatation. No pancreatic or peripancreatic fluid collections or inflammatory changes. Spleen: Unremarkable. Adrenals/Urinary Tract: Subcentimeter low-attenuation lesions in both kidneys, too small to characterize, but statistically likely to represent cysts. No hydroureteronephrosis. Urinary bladder is normal in appearance. Right adrenal gland is normal. 1.2 cm left adrenal nodule (axial image 23 of series 3), similar to the prior chest CT 10/06/2019. Stomach/Bowel: Normal appearance of the stomach. No pathologic dilatation of small bowel or colon. Numerous colonic diverticulae are noted, without surrounding inflammatory changes to suggest an acute diverticulitis at this time. Normal appendix. Vascular/Lymphatic: Aortic atherosclerosis, without evidence of aneurysm or dissection in the abdominal or pelvic vasculature. No lymphadenopathy noted in the abdomen or pelvis. Reproductive: Prostate gland  and seminal vesicles are unremarkable in appearance. Other: No significant volume of ascites.  No pneumoperitoneum. Musculoskeletal: There are no aggressive appearing lytic or blastic lesions noted in the visualized portions of the skeleton. IMPRESSION: 1. Small left adrenal nodule is nonspecific. This could either be a benign or malignant lesion. 2. Importantly, there are innumerable hypovascular areas in the liver which are highly suspicious for metastatic disease. This could be better evaluated with follow-up abdominal MRI with and without IV gadolinium. 3. Colonic diverticulosis without evidence of acute diverticulitis at this time. 4. Additional incidental findings, as above. Electronically Signed   By: DVinnie LangtonM.D.   On: 10/22/2019 16:40    Scheduled Meds: . amLODipine  10 mg Oral Daily  . aspirin  81 mg Oral Daily  . enoxaparin (LOVENOX) injection  40 mg Subcutaneous Daily  . lactulose  20 g Oral BID  . losartan  25 mg Oral Daily  . mometasone-formoterol  2 puff Inhalation BID  . pantoprazole  40 mg Oral Daily  . potassium chloride  40 mEq Oral BID  . predniSONE  40 mg Oral QAC breakfast  . risperiDONE  1 mg Oral QHS  . umeclidinium bromide  1 puff Inhalation Daily    Continuous Infusions: . dextrose 5 % and 0.9% NaCl 1,000 mL with potassium chloride 60 mEq infusion 100 mL/hr at 10/23/19 0220     Time spent: 318ms I have personally reviewed and interpreted on  10/23/2019 daily labs, tele strips, imagings as discussed above under date review session and assessment and plans.  I reviewed all nursing notes, pharmacy notes, consultant notes,  vitals, pertinent old records  I have discussed plan of care as described  above with RN , patient and family on 10/23/2019   Florencia Reasons MD, PhD, FACP  [facility]  Available via Epic secure chat 7am-7pm for nonurgent issues Please page for urgent issues, pager number available through [email or web address] .   10/23/2019, 10:50 AM  LOS: 6  days

## 2019-10-23 NOTE — Social Work (Signed)
CSW acknowledging consult for SNF placement. Will follow for therapy recommendations needed to best determine disposition/for insurance authorization.   Skarlett Sedlacek, MSW, LCSW  Clinical Social Work    

## 2019-10-23 NOTE — Progress Notes (Signed)
Delivered Advanced Directive paperwork to Drew Nguyen' room. Will contact his brother on when to come back to have paperwork signed and notarized. [name] also asked for prayer. Says that he "won't give up yet." Will continue to be available for spiritual care as needed.  Rev. [place].

## 2019-10-23 NOTE — Telephone Encounter (Signed)
FYI I called patient to confirm 3/8 appointment. I spoke with caregiver, who states that patient is currently in the hospital and probably will not make his appointment. I asked her if she wanted to reschedule now, she states that she will call back later because she is uncertain of what will happen to patient.

## 2019-10-23 NOTE — Telephone Encounter (Signed)
Noted! Thank you

## 2019-10-23 NOTE — Evaluation (Signed)
Physical Therapy Evaluation Patient Details Name: Drew Nguyen MRN: [id number] DOB: 1956-02-21 Today's Date: 10/23/2019   History of Present Illness  Pt is  an 64 y.o. male 61 white male HTN, COPD stage II-III, cocaine/EtOH abuse, depression, rheumatoid arthritis on recently started on Plaquenil, left corona radiata CVA 03/2019 with repeat stroke 06/06/2019,?  Vocal cord malignancy.  He presented to ED 10/17/2019 secondary generalized tonic-clonic seizures witnessed by home health aide.  Pt found to have L hilar mass that is advanced small cell lung CA.  Per oncology plan for SNF to strength before treatments.  Clinical Impression   Pt admitted with above diagnosis. Pt requiring min-mod A for transfers and only able to ambulate 4' with RW.  Pt required increased time and cues for all mobility.  Pt currently with functional limitations due to the deficits listed below (see PT Problem List). Pt will benefit from skilled PT to increase their independence and safety with mobility to allow discharge to the venue listed below.       Follow Up Recommendations SNF    Equipment Recommendations  Other (comment)(defer)    Recommendations for Other Services       Precautions / Restrictions Precautions Precautions: Fall      Mobility  Bed Mobility Overal bed mobility: Needs Assistance             General bed mobility comments: in chair at arrival: RN reports pt needed assist  Transfers Overall transfer level: Needs assistance Equipment used: Rolling walker (2 wheeled) Transfers: Sit to/from Stand Sit to Stand: Mod assist         General transfer comment: cues for hand placement and use of momentum; increased time  Ambulation/Gait Ambulation/Gait assistance: Min assist Gait Distance (Feet): 4 Feet Assistive device: Rolling walker (2 wheeled) Gait Pattern/deviations: Decreased stride length;Step-to pattern;Shuffle     General Gait Details: very slow gait; shuffle steps; assist  to weight shift; in addition to gait pt performing some general ROM exercises in standing (HS curls, SLR)  Stairs            Wheelchair Mobility    Modified Rankin (Stroke Patients Only)       Balance Overall balance assessment: Needs assistance Sitting-balance support: Bilateral upper extremity supported;Feet supported Sitting balance-Leahy Scale: Good     Standing balance support: Bilateral upper extremity supported;During functional activity Standing balance-Leahy Scale: Poor                               Pertinent Vitals/Pain Pain Assessment: No/denies pain    Home Living Family/patient expects to be discharged to:: Private residence Living Arrangements: Children;Other (Comment)(reports has HH aide 24 hr per day; 3 children ages 3, 56, and 47) Available Help at Discharge: Personal care attendant Type of Home: House Home Access: Level entry;Ramped entrance     Home Layout: One level Home Equipment: Walker - 2 wheels;Wheelchair - manual;Bedside commode(from prior hx - pt reports needs RW)      Prior Function Level of Independence: Needs assistance   Gait / Transfers Assistance Needed: household with cane  ADL's / Homemaking Assistance Needed: aide assisted with dressing and showers; pt could do toileting        Hand Dominance        Extremity/Trunk Assessment   Upper Extremity Assessment Upper Extremity Assessment: LUE deficits/detail;RUE deficits/detail RUE Deficits / Details: MMT 4-/5 and ROM WFL RUE Coordination: decreased fine motor;decreased gross motor LUE  Deficits / Details: MMT 4/5, ROM WFL    Lower Extremity Assessment Lower Extremity Assessment: LLE deficits/detail;RLE deficits/detail RLE Deficits / Details: ROM WFL; MMT 4+/5 RLE Coordination: decreased gross motor LLE Deficits / Details: ROM WFL; MMT 4+/5       Communication      Cognition Arousal/Alertness: Awake/alert Behavior During Therapy: WFL for tasks  assessed/performed Overall Cognitive Status: No family/caregiver present to determine baseline cognitive functioning                                 General Comments: Slow to respond and required increased time and multimodal cues      General Comments General comments (skin integrity, edema, etc.): VSS    Exercises     Assessment/Plan    PT Assessment Patient needs continued PT services  PT Problem List Decreased strength;Decreased mobility;Decreased safety awareness;Decreased range of motion;Decreased coordination;Decreased activity tolerance;Cardiopulmonary status limiting activity;Decreased balance;Decreased knowledge of use of DME       PT Treatment Interventions DME instruction;Therapeutic activities;Gait training;Therapeutic exercise;Patient/family education;Balance training;Functional mobility training    PT Goals (Current goals can be found in the Care Plan section)  Acute Rehab PT Goals Patient Stated Goal: SNF to get strong enough for CA treatments PT Goal Formulation: With patient Time For Goal Achievement: 11/06/19 Potential to Achieve Goals: Fair    Frequency Min 2X/week   Barriers to discharge        Co-evaluation               AM-PAC PT "6 Clicks" Mobility  Outcome Measure Help needed turning from your back to your side while in a flat bed without using bedrails?: A Little Help needed moving from lying on your back to sitting on the side of a flat bed without using bedrails?: A Little Help needed moving to and from a bed to a chair (including a wheelchair)?: A Little Help needed standing up from a chair using your arms (e.g., wheelchair or bedside chair)?: A Lot Help needed to walk in hospital room?: A Lot Help needed climbing 3-5 steps with a railing? : A Lot 6 Click Score: 2    End of Session Equipment Utilized During Treatment: Gait belt Activity Tolerance: Patient limited by fatigue Patient left: with chair alarm set;in  chair;with call bell/phone within reach Nurse Communication: Mobility status PT Visit Diagnosis: Unsteadiness on feet (R26.81);Muscle weakness (generalized) (M62.81)    Time: 1730-1800 PT Time Calculation (min) (ACUTE ONLY): 30 min   Charges:   PT Evaluation $PT Eval Low Complexity: 1 Low          Maggie Font, PT Acute Rehab Services Pager [phone] [facility] [phone] [facility] [phone]   Karlton Lemon 10/23/2019, 6:03 PM

## 2019-10-23 NOTE — Progress Notes (Signed)
Pt refuses to transfer over to [facility], he states he's tired and wants to go to bed. Pt also states he was told that radiation would not start for 2 weeks, and he wants to work on getting his strength back before starting radiation treatments.   Pt is agreeable to go over to [facility] in the am to have his simulation markings done.   Message sent thru chat conversation with doctors/PAs Dr Erlinda Hong and Shona Simpson PA and Hulen Shouts. making them aware of pt's refusal to go over to [place].   [facility] notified of need for transport to [facility] for radiation simulation, with need for pt to be there by 0900 in the am. [facility] will be here at 0815 in the morning 10/24/2019.    Bed control/patient placement notified of pt refusal to go to [place], they were made aware he will be going to [facility] for simulation in the am, and they will try to hold the inpatient bed for him if they can.  And if pt agrees to go/stay at [facility]   Bryson Ha PA messaged that Dr Tammi Klippel and Ailene Ards will be caring for him over at [facility]. This nurse asked if they have spoke with pt's brother any about the plan, pt's brother trying to get health care POA, chaplin is aware and planning to help with that tomorrow.  They are planning to discuss further about the radiation with the pt tomorrow per PAs.

## 2019-10-23 NOTE — Consult Note (Signed)
[place]  [phone] [phone]   MEDICAL ONCOLOGY - INITIAL CONSULTATION  Referral MD: Dr. 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  The patient was witnessed to have a seizure by his home health aide.  It was reported to the ER physician that the patient had a generalized tonic-clonic seizure where he lost consciousness for about 1-1/2 minutes.  The patient had no recollection of the incident.  Work-up in the ER showed a WBC of 14.4, sodium 122, potassium 2.7, calcium 8.5.  Urine drug screen was negative.  Ethanol level was undetectable.  A chest x-ray showed an asymmetric hazy opacity in the left thorax with asymmetric density in the left hilum concerning for diffuse lung parenchymal air disease versus lung mass.  He had a CT of the head without contrast in the ER which showed chronic small vessel ischemic changes but no acute intracranial process.  The patient had a CT of the chest with contrast on 10/17/2019 which showed a left hilar mass and associated mediastinal and left hilar adenopathy compatible with malignancy.  The mass results in extrinsic compression of the left upper lobe pulmonary artery as well as complete obstruction of the left upper lobe bronchus and complete left upper lobe atelectasis.  He also has circumferential mural thickening of the distal thoracic esophagus and endoscopy has been recommended.  He was noted to have left adrenal thickening which was nonspecific.  MRI of the brain with and without contrast performed on 10/17/2019 showed remote nonhemorrhagic lacunar infarcts of the basal ganglia bilaterally extending into the corona radiata but no acute or focal intracranial abnormality to explain seizures or confusion.  PCCM was consulted due to his lung  mass and he underwent a flexible video of her optic bronchoscopy with endobronchial ultrasound on 11/04/2019.  Biopsy of the left lung mass and lymph nodes were consistent with small cell lung cancer.  Additionally, the patient had a CT of the abdomen pelvis with contrast performed on 10/22/2019 which showed a small left adrenal nodule which is nonspecific, innumerable hypovascular areas in the liver which are highly suspicious for metastatic disease and these could be better evaluated with follow-up abdominal MRI.  When seen today, there is no family at the bedside.  Brother, Drew Nguyen, was here to visit earlier today per nursing and the patient has indicated that he would like his brother to be his healthcare power of attorney.  Documents have not yet been signed.  The patient tells me that he had a good appetite prior to admission in fact had gained 5 pounds.  He reports headaches which are at baseline.  He reports seeing "black spots" at times.  He is not having any dizziness.  He reports having chest discomfort in the center of his chest and has required nitroglycerin in the past for this.  He reports getting short of breath just while talking with me.  He also reports an intermittent cough and states that he has had hemoptysis recently.  He cannot quantify the amount.  He denies abdominal pain, nausea, vomiting, constipation, diarrhea.  He states that he is single and has 3 children ages 21, 78, and 2.  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  At baseline, he needs assistance with ADLs and has  difficulty ambulating.  The patient smokes 1.5 packs of cigarettes per day x48 years.  Reports occasional alcohol use.  He has a history of cocaine abuse. Medical oncology was asked to see the patient to make recommendations regarding his small cell lung cancer.   Past Medical History:  Diagnosis Date  . COPD (chronic obstructive pulmonary disease) ([place])   . Current smoker   . Depression    . Hypertension   . RA (rheumatoid arthritis) ([place])   . Tobacco abuse   :  Past Surgical History:  Procedure Laterality Date  . ANKLE SURGERY    . BIOPSY  10/27/2019   Procedure: BIOPSY;  Surgeon: Spero Geralds, MD;  Location: [facility] ENDOSCOPY;  Service: Pulmonary;;  . BRONCHIAL BRUSHINGS  11/14/2019   Procedure: BRONCHIAL BRUSHINGS;  Surgeon: Spero Geralds, MD;  Location: [facility] ENDOSCOPY;  Service: Pulmonary;;  . BRONCHIAL NEEDLE ASPIRATION BIOPSY  10/26/2019   Procedure: BRONCHIAL NEEDLE ASPIRATION BIOPSIES;  Surgeon: Spero Geralds, MD;  Location: [facility] ENDOSCOPY;  Service: Pulmonary;;  . ENDOBRONCHIAL ULTRASOUND  11/07/2019   Procedure: ENDOBRONCHIAL ULTRASOUND;  Surgeon: Spero Geralds, MD;  Location: [facility] ENDOSCOPY;  Service: Pulmonary;;  . MULTIPLE TOOTH EXTRACTIONS    . VIDEO BRONCHOSCOPY WITH ENDOBRONCHIAL ULTRASOUND N/A 11/14/2019   Procedure: VIDEO BRONCHOSCOPY;  Surgeon: Spero Geralds, MD;  Location: [facility] ENDOSCOPY;  Service: Pulmonary;  Laterality: N/A;  :  Current Facility-Administered Medications  Medication Dose Route Frequency Provider Last Rate Last Admin  . acetaminophen (TYLENOL) tablet 650 mg  650 mg Oral Q6H PRN Rise Patience, MD   650 mg at 10/23/19 0547   Or  . acetaminophen (TYLENOL) suppository 650 mg  650 mg Rectal Q6H PRN Rise Patience, MD      . amLODipine (NORVASC) tablet 10 mg  10 mg Oral Nguyen Jennye Boroughs, MD   10 mg at 10/23/19 0835  . aspirin chewable tablet 81 mg  81 mg Oral Nguyen Guilford Shi, MD   81 mg at 10/23/19 0836  . dextrose 5 % and 0.9% NaCl 1,000 mL with potassium chloride 60 mEq infusion   Intravenous Continuous Nita Sells, MD 100 mL/hr at 10/23/19 0220 New Bag at 10/23/19 0220  . enoxaparin (LOVENOX) injection 40 mg  40 mg Subcutaneous Nguyen Rise Patience, MD   40 mg at 10/23/19 0836  . guaiFENesin-dextromethorphan (ROBITUSSIN DM) 100-10 MG/5ML syrup 5 mL  5 mL Oral Q4H PRN Nita Sells, MD   5 mL at  10/23/19 0247  . hydrALAZINE (APRESOLINE) injection 10 mg  10 mg Intravenous Q4H PRN Rise Patience, MD   10 mg at 10/23/19 0253  . lactulose (CHRONULAC) 10 GM/15ML solution 20 g  20 g Oral BID Nita Sells, MD   20 g at 10/23/19 0837  . LORazepam (ATIVAN) injection 1 mg  1 mg Intravenous Q6H PRN Rise Patience, MD   1 mg at 10/28/2019 0009  . LORazepam (ATIVAN) tablet 0.5 mg  0.5 mg Oral Q6H PRN Lang Snow, FNP   0.5 mg at 10/23/19 0256  . losartan (COZAAR) tablet 25 mg  25 mg Oral Nguyen Jennye Boroughs, MD   25 mg at 10/23/19 0836  . mometasone-formoterol (DULERA) 200-5 MCG/ACT inhaler 2 puff  2 puff Inhalation BID Rise Patience, MD   2 puff at 10/23/19 0757  . ondansetron (ZOFRAN) tablet 4 mg  4 mg Oral Q6H PRN Rise Patience, MD       Or  . ondansetron [facility]) injection  4 mg  4 mg Intravenous Q6H PRN Rise Patience, MD      . pantoprazole (PROTONIX) EC tablet 40 mg  40 mg Oral Nguyen Rise Patience, MD   40 mg at 10/23/19 0836  . phenol (CHLORASEPTIC) mouth spray 1 spray  1 spray Mouth/Throat PRN Nita Sells, MD   1 spray at 10/19/19 1703  . potassium chloride (KLOR-CON) packet 40 mEq  40 mEq Oral BID Nita Sells, MD   40 mEq at 10/23/19 0838  . predniSONE (DELTASONE) tablet 40 mg  40 mg Oral QAC breakfast Nita Sells, MD   40 mg at 10/23/19 0836  . risperiDONE (RISPERDAL M-TABS) disintegrating tablet 1 mg  1 mg Oral QHS Nita Sells, MD   1 mg at 10/22/19 2134  . umeclidinium bromide (INCRUSE ELLIPTA) 62.5 MCG/INH 1 puff  1 puff Inhalation Nguyen Margaretha Seeds, MD   1 puff at 10/23/19 0757     Allergies  Allergen Reactions  . Celexa [Citalopram] Other (See Comments)    Over sedation Sleeps too much  :  Family History  Problem Relation Age of Onset  . Hypertension Mother   . Heart attack Mother   . Heart attack Father   . Brain cancer Brother   . Colon cancer Maternal Aunt   . Stomach cancer Neg  Hx   . Esophageal cancer Neg Hx   . Pancreatic cancer Neg Hx   :  Social History   Socioeconomic History  . Marital status: Single    Spouse name: Not on file  . Number of children: Not on file  . Years of education: Not on file  . Highest education level: Not on file  Occupational History  . Occupation: retired  Tobacco Use  . Smoking status: Current Every Day Smoker    Packs/day: 1.50    Years: 48.00    Pack years: 72.00    Types: Cigarettes  . Smokeless tobacco: Never Used  . Tobacco comment: 10/25/18 1 to 1 1/2 ppd  Substance and Sexual Activity  . Alcohol use: Yes    Comment: occ  . Drug use: Yes    Types: Cocaine, Benzodiazepines  . Sexual activity: Not on file  Other Topics Concern  . Not on file  Social History Narrative  . Not on file   Social Determinants of Health   Financial Resource Strain:   . Difficulty of Paying Living Expenses: Not on file  Food Insecurity:   . Worried About Charity fundraiser in the Last Year: Not on file  . Ran Out of Food in the Last Year: Not on file  Transportation Needs:   . Lack of Transportation (Medical): Not on file  . Lack of Transportation (Non-Medical): Not on file  Physical Activity:   . Days of Exercise per Week: Not on file  . Minutes of Exercise per Session: Not on file  Stress:   . Feeling of Stress : Not on file  Social Connections:   . Frequency of Communication with Friends and Family: Not on file  . Frequency of Social Gatherings with Friends and Family: Not on file  . Attends Religious Services: Not on file  . Active Member of Clubs or Organizations: Not on file  . Attends Archivist Meetings: Not on file  . Marital Status: Not on file  Intimate Partner Violence:   . Fear of Current or Ex-Partner: Not on file  . Emotionally Abused: Not on file  . Physically Abused:  Not on file  . Sexually Abused: Not on file  :  Review of Systems: A comprehensive 14 point review of systems was negative  except as noted in the HPI.  Exam: Patient Vitals for the past 24 hrs:  BP Temp Temp src Pulse Resp SpO2  10/23/19 0756 -- -- -- 86 18 94 %  10/23/19 0511 (!) 162/84 97.7 F (36.5 C) Oral 84 18 92 %  10/23/19 0401 (!) 147/88 -- -- 93 -- --  10/23/19 0251 (!) 180/103 -- -- 97 -- --  10/22/19 2356 (!) 165/93 -- -- -- -- 97 %  10/22/19 2011 (!) 175/96 98.1 F (36.7 C) Oral 86 -- --  10/22/19 1800 (!) 166/93 98.2 F (36.8 C) Oral 90 17 93 %  10/22/19 1237 (!) 176/89 97.7 F (36.5 C) Oral 82 17 91 %    General: Chronically ill-appearing male, no distress, difficult to understand at times due to slurred speech Eyes:  no scleral icterus.   ENT: White coating noted on his tongue Neck was without thyromegaly.   Lymphatics:  Negative cervical, supraclavicular or axillary adenopathy.   Respiratory: lungs were clear bilaterally without wheezing or crackles.   Cardiovascular:  Regular rate and rhythm, S1/S2, without murmur, rub or gallop.  There was no pedal edema.   GI:  abdomen was soft, flat, nontender, nondistended, without organomegaly.   Musculoskeletal:  no spinal tenderness of palpation of vertebral spine.   Skin: Multiple bruises noted on his upper extremities, no petechiae MSK: Strength 4/5 in the bilateral lower extremities. Neuro exam was nonfocal.  The patient is alert and oriented to person and place but not to time.  Speech was not pressured.  Thought tangential at times.   Lab Results  Component Value Date   WBC 17.0 (H) 10/20/2019   HGB 13.9 10/20/2019   HCT 40.4 10/20/2019   PLT 260 10/20/2019   GLUCOSE 156 (H) 10/20/2019   CHOL 155 06/06/2019   TRIG 43 06/06/2019   HDL 93 06/06/2019   LDLCALC 53 06/06/2019   ALT 326 (H) 10/22/2019   AST 131 (H) 10/22/2019   NA 141 10/20/2019   K 3.4 (L) 11/09/2019   CL 106 10/24/2019   CREATININE 0.63 11/13/2019   BUN 12 11/07/2019   CO2 25 11/19/2019    EEG  Result Date: 10/17/2019 Lora Havens, MD     10/17/2019   1:38 PM Patient Name: TADASHI BURKEL MRN: [id number] Epilepsy Attending: Lora Havens Referring Physician/Provider: Dr. Gean Birchwood Date: 10/17/2019 Duration: 25.26 minutes Patient history: 64 year old male with history of alcohol use, cocaine use who presented with generalized tonic-clonic seizure-like episode.  EEG to evaluate for seizures. Level of alertness: Awake AEDs during EEG study: Ativan Technical aspects: This EEG study was done with scalp electrodes positioned according to the 10-20 International system of electrode placement. Electrical activity was acquired at a sampling rate of 500Hz  and reviewed with a high frequency filter of 70Hz  and a low frequency filter of 1Hz . EEG data were recorded continuously and digitally stored. Description: The posterior dominant rhythm consists of 9-10 Hz activity of moderate voltage (25-35 uV) seen predominantly in posterior head regions, symmetric and reactive to eye opening and eye closing.  There is also 15 to 18 Hz beta activity  distributed symmetrically and diffusely.  Physiologic photic driving was seen during photic stimulation.  Hyperventilation was not performed. IMPRESSION: This study is within normal limits. No seizures or epileptiform discharges were seen throughout the recording. Priyanka  Barbra Sarks   CT Head Wo Contrast  Result Date: 10/14/2019 CLINICAL DATA:  Altered level of consciousness, seizure EXAM: CT HEAD WITHOUT CONTRAST TECHNIQUE: Contiguous axial images were obtained from the base of the skull through the vertex without intravenous contrast. COMPARISON:  06/05/2019, 06/06/2019 FINDINGS: Brain: There are chronic small vessel ischemic changes seen within the bilateral basal ganglia and periventricular white matter. No acute infarct or hemorrhage. Lateral ventricles and remaining midline structures are unremarkable. No acute extra-axial fluid collections. No mass effect. Vascular: No hyperdense vessel or unexpected calcification. Skull:  Normal. Negative for fracture or focal lesion. Sinuses/Orbits: No acute finding. Other: None IMPRESSION: 1. Chronic small vessel ischemic changes, no acute intracranial process. Electronically Signed   By: Randa Ngo M.D.   On: 09/27/2019 22:54   CT CHEST W CONTRAST  Result Date: 10/17/2019 CLINICAL DATA:  Abnormal chest x-ray, pulmonary nodule, seizure, altered level of consciousness EXAM: CT CHEST WITH CONTRAST TECHNIQUE: Multidetector CT imaging of the chest was performed during intravenous contrast administration. CONTRAST:  72mL OMNIPAQUE IOHEXOL 300 MG/ML  SOLN COMPARISON:  10/12/2019, 03/04/2019 FINDINGS: Cardiovascular: The heart is unremarkable without pericardial effusion. Thoracic aorta is normal in caliber without aneurysm or dissection. No filling defects or pulmonary emboli. Extrinsic compression upon the left pulmonary artery due to left hilar mass. Moderate atherosclerosis of the coronary vasculature, with mild calcified plaque of the aortic arch. Mediastinum/Nodes: There is a lobular soft tissue mass in the mediastinum and AP window, measuring 4.7 x 5.3 cm. Large left hilar mass with obstruction of the left upper lobe bronchus. No contralateral hilar adenopathy. There is circumferential wall thickening of the distal thoracic esophagus, nonspecific. Endoscopy may be useful. Lungs/Pleura: There is complete collapse of the left upper lobe due to a large left hilar mass causing abrupt cut off of the left upper lobe bronchus. Distinguishing the left hilar mass from the consolidated left upper lobe is difficult. PET scan recommended for further evaluation. Background emphysema. Minimal tree in bud ground-glass airspace disease within the left lower lobe likely inflammatory or infectious. There is narrowing of the left lower lobe bronchus with mucoid material filling the posterior basilar segmental bronchi. No effusion or pneumothorax. Upper Abdomen: Thickening of the left adrenal gland measuring  12 mm, suspicious for metastases. Remainder of the upper abdomen is unremarkable. Musculoskeletal: No acute or destructive bony lesions. Reconstructed images demonstrate no additional findings. IMPRESSION: 1. 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 This mass results in extrinsic compression of the left upper lobe pulmonary artery, as well as complete obstruction of the left upper lobe bronchus and complete left upper lobe atelectasis. 2. Circumferential mural thickening of the distal thoracic esophagus. Endoscopy recommended for further evaluation. 3. Minimal left lower lobe tree-in-bud ground-glass airspace disease consistent with inflammation or infection. 4. Left adrenal thickening, nonspecific. Metastatic disease not excluded. 5.  Emphysema (ICD10-J43.9). Electronically Signed   By: Randa Ngo M.D.   On: 10/17/2019 02:01   MR BRAIN W WO CONTRAST  Result Date: 10/17/2019 CLINICAL DATA:  Seizure. Abnormal neuro exam. Hypertension. Patient stopped drinking Nguyen alcohol 1 week ago. Confusion. EXAM: MRI HEAD WITHOUT AND WITH CONTRAST TECHNIQUE: Multiplanar, multiecho pulse sequences of the brain and surrounding structures were obtained without and with intravenous contrast. CONTRAST:  66mL GADAVIST GADOBUTROL 1 MMOL/ML IV SOLN COMPARISON:  CT head without contrast 10/13/2019. MRI of the head 04/19/2019 FINDINGS: Brain: The diffusion-weighted images demonstrate no acute or subacute infarct. Remote lacunar infarcts are present  within the basal ganglia extending superiorly into the corona radiata bilaterally. Mild atrophy and white matter disease is present bilaterally, otherwise stable. The basal ganglia are otherwise unremarkable. Insular ribbon is within limits bilaterally. Study is mildly degraded by patient motion. Temporal lobes are normal. The internal auditory canals are normal bilaterally. The brainstem and cerebellum are normal. Postcontrast  images demonstrate no pathologic enhancement. Vascular: Flow is present in the major intracranial arteries. Skull and upper cervical spine: The craniocervical junction is normal. Upper cervical spine is within normal limits. Marrow signal is unremarkable. Sinuses/Orbits: The paranasal sinuses and mastoid air cells are clear. The globes and orbits are within normal limits. IMPRESSION: 1. Remote nonhemorrhagic lacunar infarcts of the basal ganglia bilaterally extending into the corona radiata. 2. Atrophy and white matter disease are mildly advanced for age. 3. No acute or focal intracranial abnormality to explain seizures or confusion. Electronically Signed   By: San Morelle M.D.   On: 10/17/2019 05:30   CT ABDOMEN PELVIS W CONTRAST  Result Date: 10/22/2019 CLINICAL DATA:  64 year old male with history of adrenal mass suspicious for malignancy. Follow-up study. EXAM: CT ABDOMEN AND PELVIS WITH CONTRAST TECHNIQUE: Multidetector CT imaging of the abdomen and pelvis was performed using the standard protocol following bolus administration of intravenous contrast. CONTRAST:  115mL OMNIPAQUE IOHEXOL 300 MG/ML  SOLN COMPARISON:  No priors. FINDINGS: Lower chest: Atherosclerotic calcifications in the left circumflex and right coronary arteries. Hepatobiliary: Multiple ill-defined hypovascular lesions are scattered throughout the hepatic parenchyma, highly concerning for widespread metastatic disease. One specific example is the lesion at the junction of segment 8 and the caudate lobe (axial image 19 of series 3) measuring 3.2 x 2.1 cm. No intra or extrahepatic biliary ductal dilatation. Gallbladder is nearly completely collapsed with avid enhancement of the gallbladder mucosa and diffuse wall edema, without surrounding inflammatory changes. Pancreas: No pancreatic mass. No pancreatic ductal dilatation. No pancreatic or peripancreatic fluid collections or inflammatory changes. Spleen: Unremarkable. Adrenals/Urinary  Tract: Subcentimeter low-attenuation lesions in both kidneys, too small to characterize, but statistically likely to represent cysts. No hydroureteronephrosis. Urinary bladder is normal in appearance. Right adrenal gland is normal. 1.2 cm left adrenal nodule (axial image 23 of series 3), similar to the prior chest CT 10/06/2019. Stomach/Bowel: Normal appearance of the stomach. No pathologic dilatation of small bowel or colon. Numerous colonic diverticulae are noted, without surrounding inflammatory changes to suggest an acute diverticulitis at this time. Normal appendix. Vascular/Lymphatic: Aortic atherosclerosis, without evidence of aneurysm or dissection in the abdominal or pelvic vasculature. No lymphadenopathy noted in the abdomen or pelvis. Reproductive: Prostate gland and seminal vesicles are unremarkable in appearance. Other: No significant volume of ascites.  No pneumoperitoneum. Musculoskeletal: There are no aggressive appearing lytic or blastic lesions noted in the visualized portions of the skeleton. IMPRESSION: 1. Small left adrenal nodule is nonspecific. This could either be a benign or malignant lesion. 2. Importantly, there are innumerable hypovascular areas in the liver which are highly suspicious for metastatic disease. This could be better evaluated with follow-up abdominal MRI with and without IV gadolinium. 3. Colonic diverticulosis without evidence of acute diverticulitis at this time. 4. Additional incidental findings, as above. Electronically Signed   By: Vinnie Langton M.D.   On: 10/22/2019 16:40   DG Chest Portable 1 View  Result Date: 09/29/2019 CLINICAL DATA:  Seizure, altered mental status EXAM: PORTABLE CHEST 1 VIEW COMPARISON:  06/06/2019 FINDINGS: Right lung is grossly clear. Asymmetric hazy opacification in the left thorax with increased left  hilar density. Heart size upper limits of normal. No pneumothorax. IMPRESSION: Asymmetric hazy opacity in left thorax with asymmetric  density in the left hilus, could be due to diffuse lung parenchymal air disease versus lung mass. Chest CT is recommended for further evaluation. Electronically Signed   By: Donavan Foil M.D.   On: 09/25/2019 22:42     EEG  Result Date: 10/17/2019 Lora Havens, MD     10/17/2019  1:38 PM Patient Name: BREXTON SOFIA MRN: [id number] Epilepsy Attending: Lora Havens Referring Physician/Provider: Dr. Gean Birchwood Date: 10/17/2019 Duration: 25.26 minutes Patient history: 64 year old male with history of alcohol use, cocaine use who presented with generalized tonic-clonic seizure-like episode.  EEG to evaluate for seizures. Level of alertness: Awake AEDs during EEG study: Ativan Technical aspects: This EEG study was done with scalp electrodes positioned according to the 10-20 International system of electrode placement. Electrical activity was acquired at a sampling rate of 500Hz  and reviewed with a high frequency filter of 70Hz  and a low frequency filter of 1Hz . EEG data were recorded continuously and digitally stored. Description: The posterior dominant rhythm consists of 9-10 Hz activity of moderate voltage (25-35 uV) seen predominantly in posterior head regions, symmetric and reactive to eye opening and eye closing.  There is also 15 to 18 Hz beta activity  distributed symmetrically and diffusely.  Physiologic photic driving was seen during photic stimulation.  Hyperventilation was not performed. IMPRESSION: This study is within normal limits. No seizures or epileptiform discharges were seen throughout the recording. Lora Havens   CT Head Wo Contrast  Result Date: 10/18/2019 CLINICAL DATA:  Altered level of consciousness, seizure EXAM: CT HEAD WITHOUT CONTRAST TECHNIQUE: Contiguous axial images were obtained from the base of the skull through the vertex without intravenous contrast. COMPARISON:  06/05/2019, 06/06/2019 FINDINGS: Brain: There are chronic small vessel ischemic changes seen  within the bilateral basal ganglia and periventricular white matter. No acute infarct or hemorrhage. Lateral ventricles and remaining midline structures are unremarkable. No acute extra-axial fluid collections. No mass effect. Vascular: No hyperdense vessel or unexpected calcification. Skull: Normal. Negative for fracture or focal lesion. Sinuses/Orbits: No acute finding. Other: None IMPRESSION: 1. Chronic small vessel ischemic changes, no acute intracranial process. Electronically Signed   By: Randa Ngo M.D.   On: 10/01/2019 22:54   CT CHEST W CONTRAST  Result Date: 10/17/2019 CLINICAL DATA:  Abnormal chest x-ray, pulmonary nodule, seizure, altered level of consciousness EXAM: CT CHEST WITH CONTRAST TECHNIQUE: Multidetector CT imaging of the chest was performed during intravenous contrast administration. CONTRAST:  36mL OMNIPAQUE IOHEXOL 300 MG/ML  SOLN COMPARISON:  09/24/2019, 03/04/2019 FINDINGS: Cardiovascular: The heart is unremarkable without pericardial effusion. Thoracic aorta is normal in caliber without aneurysm or dissection. No filling defects or pulmonary emboli. Extrinsic compression upon the left pulmonary artery due to left hilar mass. Moderate atherosclerosis of the coronary vasculature, with mild calcified plaque of the aortic arch. Mediastinum/Nodes: There is a lobular soft tissue mass in the mediastinum and AP window, measuring 4.7 x 5.3 cm. Large left hilar mass with obstruction of the left upper lobe bronchus. No contralateral hilar adenopathy. There is circumferential wall thickening of the distal thoracic esophagus, nonspecific. Endoscopy may be useful. Lungs/Pleura: There is complete collapse of the left upper lobe due to a large left hilar mass causing abrupt cut off of the left upper lobe bronchus. Distinguishing the left hilar mass from the consolidated left upper lobe is difficult. PET scan recommended for further evaluation.  Background emphysema. Minimal tree in bud  ground-glass airspace disease within the left lower lobe likely inflammatory or infectious. There is narrowing of the left lower lobe bronchus with mucoid material filling the posterior basilar segmental bronchi. No effusion or pneumothorax. Upper Abdomen: Thickening of the left adrenal gland measuring 12 mm, suspicious for metastases. Remainder of the upper abdomen is unremarkable. Musculoskeletal: No acute or destructive bony lesions. Reconstructed images demonstrate no additional findings. IMPRESSION: 1. 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 This mass results in extrinsic compression of the left upper lobe pulmonary artery, as well as complete obstruction of the left upper lobe bronchus and complete left upper lobe atelectasis. 2. Circumferential mural thickening of the distal thoracic esophagus. Endoscopy recommended for further evaluation. 3. Minimal left lower lobe tree-in-bud ground-glass airspace disease consistent with inflammation or infection. 4. Left adrenal thickening, nonspecific. Metastatic disease not excluded. 5.  Emphysema (ICD10-J43.9). Electronically Signed   By: Randa Ngo M.D.   On: 10/17/2019 02:01   MR BRAIN W WO CONTRAST  Result Date: 10/17/2019 CLINICAL DATA:  Seizure. Abnormal neuro exam. Hypertension. Patient stopped drinking Nguyen alcohol 1 week ago. Confusion. EXAM: MRI HEAD WITHOUT AND WITH CONTRAST TECHNIQUE: Multiplanar, multiecho pulse sequences of the brain and surrounding structures were obtained without and with intravenous contrast. CONTRAST:  16mL GADAVIST GADOBUTROL 1 MMOL/ML IV SOLN COMPARISON:  CT head without contrast 10/19/2019. MRI of the head 04/19/2019 FINDINGS: Brain: The diffusion-weighted images demonstrate no acute or subacute infarct. Remote lacunar infarcts are present within the basal ganglia extending superiorly into the corona radiata bilaterally. Mild atrophy and white matter disease is present  bilaterally, otherwise stable. The basal ganglia are otherwise unremarkable. Insular ribbon is within limits bilaterally. Study is mildly degraded by patient motion. Temporal lobes are normal. The internal auditory canals are normal bilaterally. The brainstem and cerebellum are normal. Postcontrast images demonstrate no pathologic enhancement. Vascular: Flow is present in the major intracranial arteries. Skull and upper cervical spine: The craniocervical junction is normal. Upper cervical spine is within normal limits. Marrow signal is unremarkable. Sinuses/Orbits: The paranasal sinuses and mastoid air cells are clear. The globes and orbits are within normal limits. IMPRESSION: 1. Remote nonhemorrhagic lacunar infarcts of the basal ganglia bilaterally extending into the corona radiata. 2. Atrophy and white matter disease are mildly advanced for age. 3. No acute or focal intracranial abnormality to explain seizures or confusion. Electronically Signed   By: San Morelle M.D.   On: 10/17/2019 05:30   CT ABDOMEN PELVIS W CONTRAST  Result Date: 10/22/2019 CLINICAL DATA:  64 year old male with history of adrenal mass suspicious for malignancy. Follow-up study. EXAM: CT ABDOMEN AND PELVIS WITH CONTRAST TECHNIQUE: Multidetector CT imaging of the abdomen and pelvis was performed using the standard protocol following bolus administration of intravenous contrast. CONTRAST:  155mL OMNIPAQUE IOHEXOL 300 MG/ML  SOLN COMPARISON:  No priors. FINDINGS: Lower chest: Atherosclerotic calcifications in the left circumflex and right coronary arteries. Hepatobiliary: Multiple ill-defined hypovascular lesions are scattered throughout the hepatic parenchyma, highly concerning for widespread metastatic disease. One specific example is the lesion at the junction of segment 8 and the caudate lobe (axial image 19 of series 3) measuring 3.2 x 2.1 cm. No intra or extrahepatic biliary ductal dilatation. Gallbladder is nearly completely  collapsed with avid enhancement of the gallbladder mucosa and diffuse wall edema, without surrounding inflammatory changes. Pancreas: No pancreatic mass. No pancreatic ductal dilatation. No pancreatic or peripancreatic fluid collections or inflammatory changes.  Spleen: Unremarkable. Adrenals/Urinary Tract: Subcentimeter low-attenuation lesions in both kidneys, too small to characterize, but statistically likely to represent cysts. No hydroureteronephrosis. Urinary bladder is normal in appearance. Right adrenal gland is normal. 1.2 cm left adrenal nodule (axial image 23 of series 3), similar to the prior chest CT 10/06/2019. Stomach/Bowel: Normal appearance of the stomach. No pathologic dilatation of small bowel or colon. Numerous colonic diverticulae are noted, without surrounding inflammatory changes to suggest an acute diverticulitis at this time. Normal appendix. Vascular/Lymphatic: Aortic atherosclerosis, without evidence of aneurysm or dissection in the abdominal or pelvic vasculature. No lymphadenopathy noted in the abdomen or pelvis. Reproductive: Prostate gland and seminal vesicles are unremarkable in appearance. Other: No significant volume of ascites.  No pneumoperitoneum. Musculoskeletal: There are no aggressive appearing lytic or blastic lesions noted in the visualized portions of the skeleton. IMPRESSION: 1. Small left adrenal nodule is nonspecific. This could either be a benign or malignant lesion. 2. Importantly, there are innumerable hypovascular areas in the liver which are highly suspicious for metastatic disease. This could be better evaluated with follow-up abdominal MRI with and without IV gadolinium. 3. Colonic diverticulosis without evidence of acute diverticulitis at this time. 4. Additional incidental findings, as above. Electronically Signed   By: Vinnie Langton M.D.   On: 10/22/2019 16:40   DG Chest Portable 1 View  Result Date: 09/23/2019 CLINICAL DATA:  Seizure, altered mental  status EXAM: PORTABLE CHEST 1 VIEW COMPARISON:  06/06/2019 FINDINGS: Right lung is grossly clear. Asymmetric hazy opacification in the left thorax with increased left hilar density. Heart size upper limits of normal. No pneumothorax. IMPRESSION: Asymmetric hazy opacity in left thorax with asymmetric density in the left hilus, could be due to diffuse lung parenchymal air disease versus lung mass. Chest CT is recommended for further evaluation. Electronically Signed   By: Donavan Foil M.D.   On: 09/30/2019 22:42    Pathology:   SURGICAL PATHOLOGY  CASE: [id number]  PATIENT: Elvera Maria  Surgical Pathology Report   Clinical History: left upper lobe mass (cm)   FINAL MICROSCOPIC DIAGNOSIS:   A. LUNG, LEFT UPPER LOBE, BIOPSY:  - Small cell carcinoma, see comment   COMMENT:   Dr. Saralyn Pilar reviewed the case and concurs with the diagnosis. Dr. Shearon Stalls  was paged on 10/22/2019.    CYTOLOGY - NON PAP  CASE: [id number]  PATIENT: Elvera Maria  Non-Gynecological Cytology Report   Clinical History: Left upper lobe mass   FINAL MICROSCOPIC DIAGNOSIS:   A. LYMPH NODE, 10L, FINE NEEDLE ASPIRATION:  - Malignant cells present, consistent with small cell carcinoma. See  comment   B. LYMPH NODE, LEFT UPPER LOBE, FINE NEEDLE ASPIRATION:  - Malignant cells present, consistent with small cell carcinoma. See  comment   Assessment and Plan:  Small cell lung cancer -Left hilar mass with mediastinal lymph hilar adenopathy, extrinsic compression of the left upper lobe pulmonary artery and complete obstruction of the left upper lobe bronchus noted on CT scan of the chest on 10/17/2019 -MRI of the brain did not show evidence of metastases on 10/17/2019 -Status post EBUS on 11/04/2019 -pathology consistent with small cell lung cancer -CT of the abdomen pelvis on 10/20/2019 with a nonspecific left adrenal nodule and innumerable lesions in the liver suspicious for metastatic disease -Discussed the diagnosis  with the patient who seems to have limited understanding of this conversation.  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  Treatment for this is he would not be curative but will  be given with palliative intent. -His performance status is marginal, ECOG performance status of 3 at best -Additionally, he continues to be encephalopathic which could represent a paraneoplastic process and may need to consider reconsulting neurology for evaluation. -Would recommend SNF for rehabilitation before he can consider systemic treatment.  Generalized tonic-clonic seizures/encephalopathy -He has not had any recurrent seizures and mental status slowly improving -He has been started on lactulose for persistently elevated ammonia level -Neurology has previously evaluated and has now signed off  Hypokalemia, hypomagnesemia, and hypophosphatemia, improving -Replete per hospitalist  Elevated LFTs -LFTs slowly increasing,?  Due to liver lesions versus medications -Monitor for now  Leukocytosis -He remains afebrile with negative cultures -Likely due to prednisone   Hypertension and history of CVA -Continue aggressive management of his hypertension -Remains on aspirin 81 mg Nguyen  Rheumatoid arthritis -The patient has been maintained on high-dose prednisone for many years -He has been on Plaquenil and Enbrel in the past which are now on hold -He is currently on prednisone 40 mg Nguyen  History of alcohol and cocaine use -The patient reports that he is not drinking all alcohol and urine drug screen on admission was negative  Tobacco dependence -Smoking cessation was encouraged   Thank you for this referral.   Mikey Bussing, DNP, AGPCNP-BC, AOCNP

## 2019-10-23 NOTE — Plan of Care (Signed)
  Problem: Clinical Measurements: Goal: Diagnostic test results will improve Outcome: Progressing   Problem: Safety: Goal: Verbalization of understanding the information provided will improve Outcome: Progressing   Problem: Clinical Measurements: Goal: Respiratory complications will improve Outcome: Progressing   Problem: Activity: Goal: Risk for activity intolerance will decrease Outcome: Progressing   Problem: Coping: Goal: Level of anxiety will decrease Outcome: Progressing   Problem: Elimination: Goal: Will not experience complications related to bowel motility Outcome: Progressing Goal: Will not experience complications related to urinary retention Outcome: Progressing   Problem: Pain Managment: Goal: General experience of comfort will improve Outcome: Progressing

## 2019-10-23 NOTE — Progress Notes (Signed)
Rechecked patient BP charted in Epic patient had removed his condom cath Hydralazine IV Mucinex oral cough medicine. replaced condom cath patient stated to cry I inquired if having pain he said no and said I am going to die. I asked if he wanted Pastoral to come and console him he refused. Gave Ativan po for his anxiety. Will continue to monitor

## 2019-10-24 ENCOUNTER — Ambulatory Visit
Admit: 2019-10-24 | Discharge: 2019-10-24 | Disposition: A | Discharge status: Home / Self Care | Payer: Medicaid Other | Attending: Radiation Oncology | Admitting: Radiation Oncology

## 2019-10-24 ENCOUNTER — Ambulatory Visit: Payer: Medicaid Other | Admitting: Radiation Oncology

## 2019-10-24 ENCOUNTER — Inpatient Hospital Stay (HOSPITAL_COMMUNITY): Payer: Medicaid Other

## 2019-10-24 DIAGNOSIS — C3490 Malignant neoplasm of unspecified part of unspecified bronchus or lung: Secondary | ICD-10-CM

## 2019-10-24 DIAGNOSIS — Z859 Personal history of malignant neoplasm, unspecified: Secondary | ICD-10-CM

## 2019-10-24 DIAGNOSIS — Z515 Encounter for palliative care: Secondary | ICD-10-CM

## 2019-10-24 DIAGNOSIS — K769 Liver disease, unspecified: Secondary | ICD-10-CM

## 2019-10-24 DIAGNOSIS — R1310 Dysphagia, unspecified: Secondary | ICD-10-CM

## 2019-10-24 DIAGNOSIS — R933 Abnormal findings on diagnostic imaging of other parts of digestive tract: Secondary | ICD-10-CM

## 2019-10-24 DIAGNOSIS — C3412 Malignant neoplasm of upper lobe, left bronchus or lung: Secondary | ICD-10-CM

## 2019-10-24 DIAGNOSIS — C3492 Malignant neoplasm of unspecified part of left bronchus or lung: Secondary | ICD-10-CM

## 2019-10-24 LAB — CBC WITH DIFFERENTIAL/PLATELET
Abs Immature Granulocytes: 0.61 10*3/uL — ABNORMAL HIGH (ref 0.00–0.07)
Basophils Absolute: 0.1 10*3/uL (ref 0.0–0.1)
Basophils Relative: 0 %
Eosinophils Absolute: 0.1 10*3/uL (ref 0.0–0.5)
Eosinophils Relative: 0 %
HCT: 36.7 % — ABNORMAL LOW (ref 39.0–52.0)
Hemoglobin: 12.5 g/dL — ABNORMAL LOW (ref 13.0–17.0)
Immature Granulocytes: 4 %
Lymphocytes Relative: 6 %
Lymphs Abs: 1 10*3/uL (ref 0.7–4.0)
MCH: 31.9 pg (ref 26.0–34.0)
MCHC: 34.1 g/dL (ref 30.0–36.0)
MCV: 93.6 fL (ref 80.0–100.0)
Monocytes Absolute: 0.8 10*3/uL (ref 0.1–1.0)
Monocytes Relative: 5 %
Neutro Abs: 13.2 10*3/uL — ABNORMAL HIGH (ref 1.7–7.7)
Neutrophils Relative %: 85 %
Platelets: 189 10*3/uL (ref 150–400)
RBC: 3.92 MIL/uL — ABNORMAL LOW (ref 4.22–5.81)
RDW: 13 % (ref 11.5–15.5)
WBC: 15.8 10*3/uL — ABNORMAL HIGH (ref 4.0–10.5)
nRBC: 0.3 % — ABNORMAL HIGH (ref 0.0–0.2)

## 2019-10-24 LAB — PHOSPHORUS: Phosphorus: 2.5 mg/dL (ref 2.5–4.6)

## 2019-10-24 LAB — COMPREHENSIVE METABOLIC PANEL WITH GFR
ALT: 500 U/L — ABNORMAL HIGH (ref 0–44)
AST: 207 U/L — ABNORMAL HIGH (ref 15–41)
Albumin: 3 g/dL — ABNORMAL LOW (ref 3.5–5.0)
Alkaline Phosphatase: 363 U/L — ABNORMAL HIGH (ref 38–126)
Anion gap: 14 (ref 5–15)
BUN: 13 mg/dL (ref 8–23)
CO2: 25 mmol/L (ref 22–32)
Calcium: 8.2 mg/dL — ABNORMAL LOW (ref 8.9–10.3)
Chloride: 102 mmol/L (ref 98–111)
Creatinine, Ser: 0.73 mg/dL (ref 0.61–1.24)
GFR calc Af Amer: >60 mL/min
GFR calc non Af Amer: >60 mL/min
Glucose, Bld: 165 mg/dL — ABNORMAL HIGH (ref 70–99)
Potassium: 3 mmol/L — ABNORMAL LOW (ref 3.5–5.1)
Sodium: 141 mmol/L (ref 135–145)
Total Bilirubin: 1 mg/dL (ref 0.3–1.2)
Total Protein: 5.6 g/dL — ABNORMAL LOW (ref 6.5–8.1)

## 2019-10-24 LAB — MAGNESIUM: Magnesium: 1.8 mg/dL (ref 1.7–2.4)

## 2019-10-24 LAB — AMMONIA: Ammonia: 52 umol/L — ABNORMAL HIGH (ref 9–35)

## 2019-10-24 IMAGING — MR MR ABDOMEN WO/W CM
17 series · 48 of 48 positions shown · IV contrast (GADAVIST)
Comparison: CT on [DATE]

CLINICAL DATA: Indeterminate liver lesions on recent CT. Recently
diagnosed lung carcinoma.

EXAM:
MRI ABDOMEN WITHOUT AND WITH CONTRAST
TECHNIQUE: Multiplanar multisequence MR imaging of the abdomen was performed
both before and after the administration of intravenous contrast.
CONTRAST:  7mL GADAVIST GADOBUTROL 1 MMOL/ML IV SOLN

[Series 2: haste_cor_mbh · coronal · 6.0mm · 1.56mm/px · 2 of 31 slices shown]
[im 1/31]
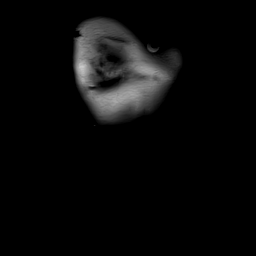
[im 31/31]
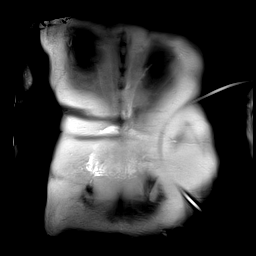

[Series 4: T2 fat-sat · axial · 6.0mm · 1.25mm/px · z∈[-103,+134]mm · 2 of 10 slices shown]
[im 1/10]
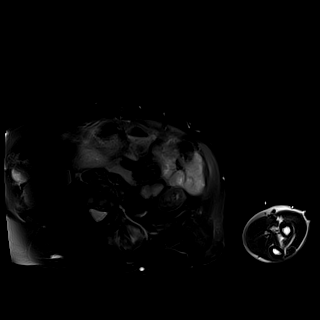
[im 10/10]
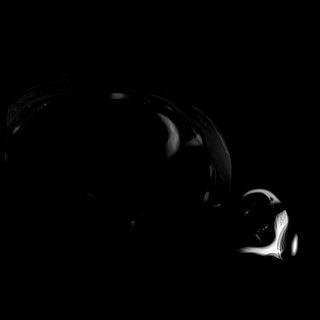

[Series 7: ax_trufi_mbh · axial · 6.0mm · 1.90mm/px · z∈[-193,+88]mm · 3 of 48 slices shown]
[im 1/48]
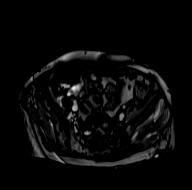
[im 24/48]
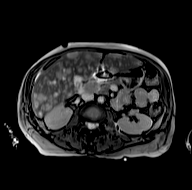
[im 48/48]
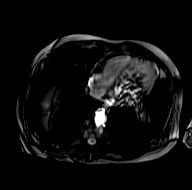

[Series 8: ax_diff_fb_tracew_dfc_mix · axial · 6.0mm · 1.36mm/px · z∈[-224,+114]mm · 4 of 95 slices shown]
[im 1/95]
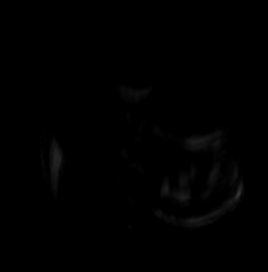
[im 32/95]
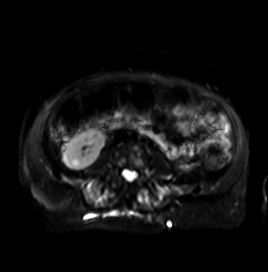
[im 63/95]
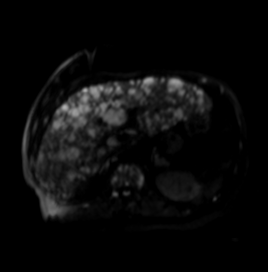
[im 95/95]
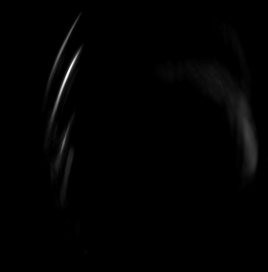

[Series 9: ax_diff_fb_adc_dfc_mix · axial · 6.0mm · 1.36mm/px · z∈[-224,+114]mm · 2 of 48 slices shown]
[im 1/48]
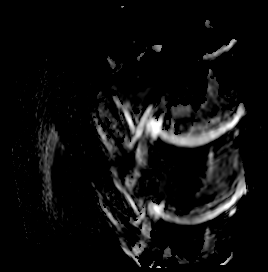
[im 48/48]
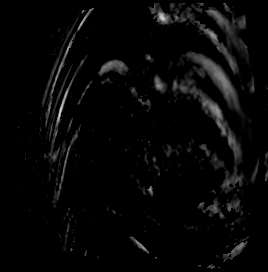

[Series 10: t1_vibe_e-dixon_tra_bh_pre_opp · axial · 3.0mm · 1.63mm/px · z∈[-195,+89]mm · 3 of 96 slices shown]
[im 1/96]
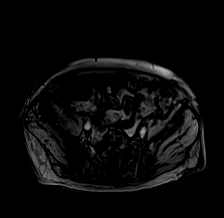
[im 48/96]
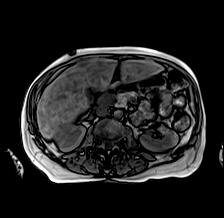
[im 96/96]
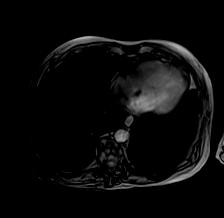

[Series 11: t1_vibe_e-dixon_tra_bh_pre_in · axial · 3.0mm · 1.63mm/px · z∈[-195,+89]mm · 3 of 96 slices shown]
[im 1/96]
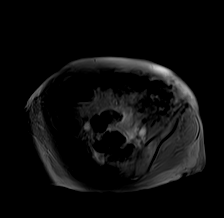
[im 48/96]
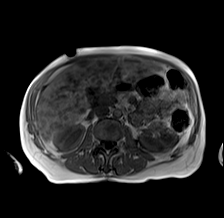
[im 96/96]
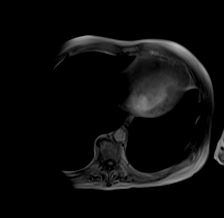

[Series 13: t1_vibe_e-dixon_tra_bh_pre_w · axial · 3.0mm · 1.63mm/px · z∈[-195,+89]mm · 3 of 96 slices shown]
[im 1/96]
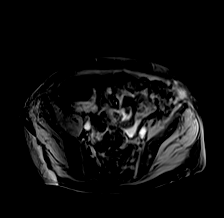
[im 48/96]
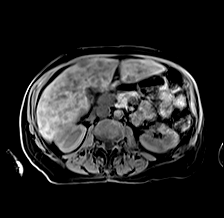
[im 96/96]
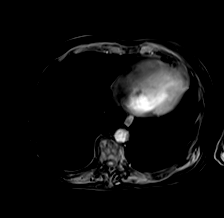

[Series 14: t1_vibe_e-dixon_tra_bh_pre_w_seg · axial · 3.0mm · 1.63mm/px · z∈[-195,+89]mm · 3 of 96 slices shown]
[im 1/96]
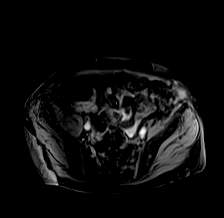
[im 48/96]
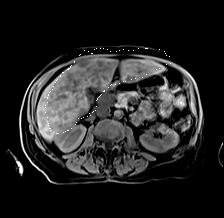
[im 96/96]
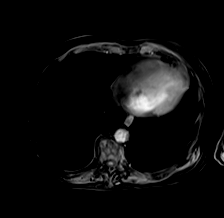

[Series 17: t1_vibe_dixon_tra_bh_arterial_w · axial · 3.0mm · 1.63mm/px · z∈[-195,+89]mm · 3 of 96 slices shown]
[im 1/96]
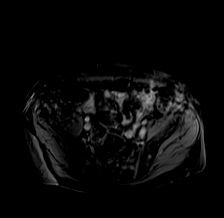
[im 48/96]
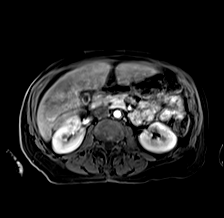
[im 96/96]
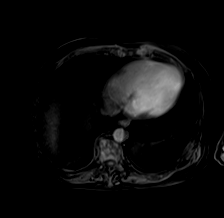

[Series 18: t1_vibe_dixon_tra_bh_arterial_w_sub · axial · 3.0mm · 1.63mm/px · z∈[-195,+89]mm · 3 of 96 slices shown]
[im 1/96]
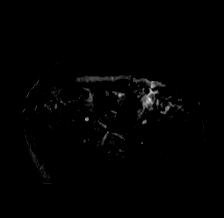
[im 48/96]
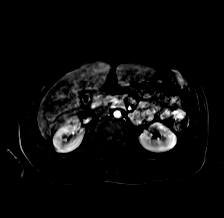
[im 96/96]
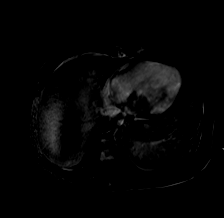

[Series 19: t1_vibe_dixon_tra_bh_venous_w · axial · 3.0mm · 1.63mm/px · z∈[-195,+89]mm · 3 of 96 slices shown]
[im 1/96]
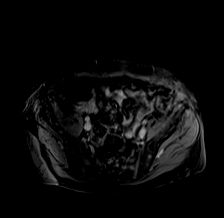
[im 48/96]
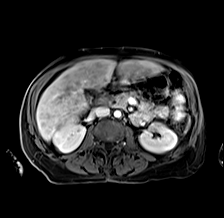
[im 96/96]
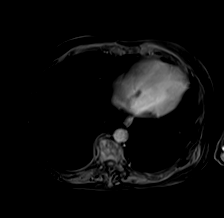

[Series 20: t1_vibe_dixon_tra_bh_venous_w_sub · axial · 3.0mm · 1.63mm/px · z∈[-195,+89]mm · 3 of 96 slices shown]
[im 1/96]
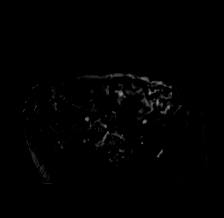
[im 48/96]
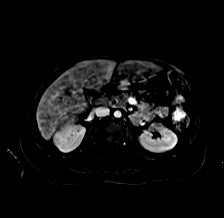
[im 96/96]
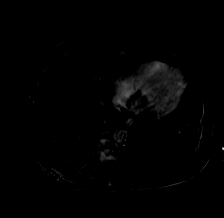

[Series 21: t2_trufi_tra_bh · axial · 6.0mm · 0.95mm/px · z∈[-187,+95]mm · 2 of 48 slices shown]
[im 1/48]
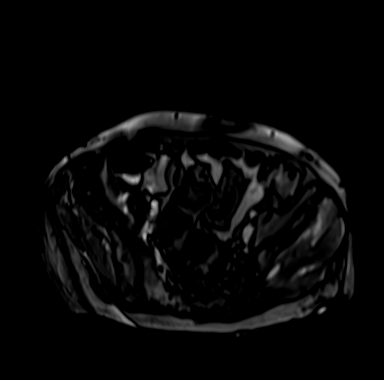
[im 48/48]
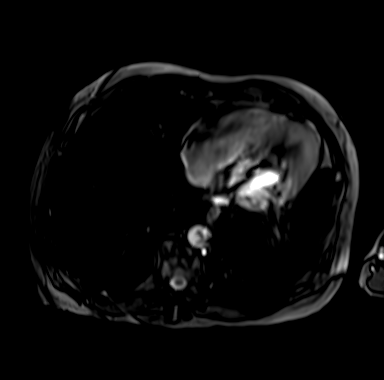

[Series 22: t1_vibe_dixon_tra_bh_delayed_w · axial · 3.0mm · 1.63mm/px · z∈[-195,+89]mm · 3 of 96 slices shown]
[im 1/96]
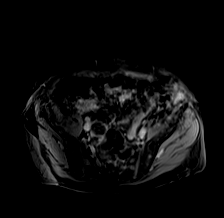
[im 48/96]
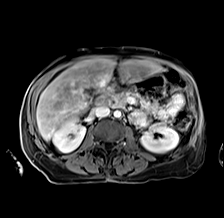
[im 96/96]
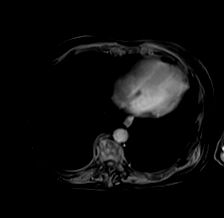

[Series 23: t1_vibe_dixon_tra_bh_delayed_w_sub · axial · 3.0mm · 1.63mm/px · z∈[-195,+89]mm · 3 of 96 slices shown]
[im 1/96]
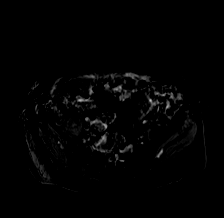
[im 48/96]
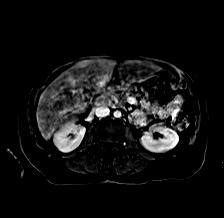
[im 96/96]
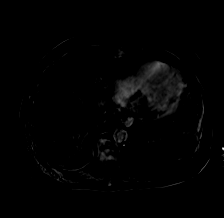

[Series 25: t1_vibe_dixon_cor_bh_post_w · coronal · 3.0mm · 2.01mm/px · 3 of 80 slices shown]
[im 1/80]
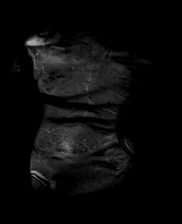
[im 40/80]
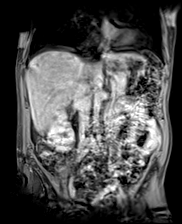
[im 80/80]
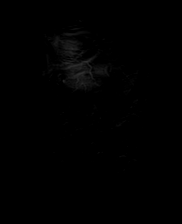

[48 of 48 positions shown; findings below may reference images not displayed]

FINDINGS: Lower chest: No acute findings.

Hepatobiliary: Innumerable small hypovascular masses are seen
throughout the liver, consistent with diffuse liver metastases.
Gallbladder is nearly completely collapsed. No evidence of biliary
ductal dilatation.

Pancreas:  No mass or inflammatory changes.

Spleen:  Within normal limits in size and appearance.

Adrenals/Urinary Tract: No masses identified. Small bilateral renal
cysts noted. No evidence of hydronephrosis.

Stomach/Bowel: Visualized portion unremarkable.

Vascular/Lymphatic: Mild lymphadenopathy in porta hepatis measuring
1.9 cm short axis. No abdominal aortic aneurysm.

Other:  None.

Musculoskeletal: Diffuse enhancing T2 hyperintense bone lesions
throughout the visualized portion of the thoracolumbar spine and
pelvis, consistent with diffuse bone metastases.
IMPRESSION: Diffuse liver metastases.

Diffuse bone metastases.

Mild abdominal lymphadenopathy in porta hepatis, consistent with
metastatic disease.

## 2019-10-24 MED ORDER — LOSARTAN POTASSIUM 50 MG PO TABS
50.0000 mg | ORAL_TABLET | Freq: Every day | ORAL | Status: DC
  Administered 2019-10-25 – 2019-11-01 (×8): 50 mg via ORAL
  Filled 2019-10-24 (×8): qty 1

## 2019-10-24 MED ORDER — FLUCONAZOLE IN SODIUM CHLORIDE 200-0.9 MG/100ML-% IV SOLN: 200.0000 mg | INTRAVENOUS | Status: DC

## 2019-10-24 MED ORDER — PANTOPRAZOLE SODIUM 40 MG PO TBEC
40.0000 mg | DELAYED_RELEASE_TABLET | Freq: Two times a day (BID) | ORAL | Status: DC
  Administered 2019-10-24 – 2019-10-31 (×12): 40 mg via ORAL
  Filled 2019-10-24 (×14): qty 1

## 2019-10-24 MED ORDER — POTASSIUM CHLORIDE CRYS ER 20 MEQ PO TBCR
40.0000 meq | EXTENDED_RELEASE_TABLET | Freq: Once | ORAL | Status: AC
  Administered 2019-10-24: 40 meq via ORAL
  Filled 2019-10-24: qty 2

## 2019-10-24 MED ORDER — CARVEDILOL 3.125 MG PO TABS
3.1250 mg | ORAL_TABLET | Freq: Two times a day (BID) | ORAL | Status: DC
  Administered 2019-10-24 – 2019-10-25 (×2): 3.125 mg via ORAL
  Filled 2019-10-24 (×3): qty 1

## 2019-10-24 MED ORDER — LIDOCAINE VISCOUS HCL 2 % MT SOLN
15.0000 mL | Freq: Three times a day (TID) | OROMUCOSAL | Status: DC
  Administered 2019-10-24 – 2019-11-01 (×13): 15 mL via OROMUCOSAL
  Filled 2019-10-24 (×36): qty 15

## 2019-10-24 MED ORDER — NYSTATIN 100000 UNIT/ML MT SUSP
5.0000 mL | Freq: Four times a day (QID) | OROMUCOSAL | Status: DC
  Administered 2019-10-24 – 2019-11-01 (×16): 500000 [IU] via ORAL
  Filled 2019-10-24 (×19): qty 5

## 2019-10-24 MED ORDER — GADOBUTROL 1 MMOL/ML IV SOLN
7.0000 mL | Freq: Once | INTRAVENOUS | Status: AC | PRN
  Administered 2019-10-24: 7 mL via INTRAVENOUS

## 2019-10-24 NOTE — Progress Notes (Addendum)
PROGRESS NOTE  Drew Nguyen KTG:[id number] DOB: 1955/12/27 DOA: 10/09/2019 PCP: Boyce Medici, FNP   Brief history:  Drew Nguyen is an 64 y.o. male 19 white male HTN, COPD stage II-III, cocaine/EtOH abuse, depression, rheumatoid arthritis on recently started on Plaquenil, left corona radiata CVA 03/2019 with repeat stroke 06/06/2019,? Vocal cord malignancy.  He presented to [facility] ED 10/17/2019 secondary generalized tonic-clonic seizures witnessed by home health aide. Work-up = left hilar mass? Adenopathy? Malignancy sodium 120 2K2.7 WBC 14 Neurology consultedEEG was normal therefore signed off. Pulmonology consulted for evaluation of left hilar mass. He has had acute toxic metabolic encephalopathy through hospital stay which has waxed and waned.  He underwent endobronchial ultrasound on 10/26/2019.  Pathology report showed small cell carcinoma.  HPI/Recap of past 24 hours:  He refused to be transferred to [facility] long yesterday, now he is agreeing to be transferred. He is alert, oriented x3 A.m. 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  Mri brain no mass CT abdomen and pelvis + liver masses, Small left adrenal nodule  -Radonc would like patient to be transferred to [facility] for simulation on March 5. -Oncology recommended palliative care consult, consult order placed. -Management per oncology and radiation oncology  Community-acquired pneumonia/postobstructive pneumonia: Started doxycycline on  2/26-completed 3/2  no cough, no hypoxia  Liver mass/hepatic encephalopathy -Hepatitis panel negative -paraneoplastic syndrome could be on differential that contribute to encephalopathy, Case discussed with oncology Dr. Maylon Peppers who will consult -started lactulose -Today he is alert and oriented x3  Addendum: esophageal thickening on CT scan  GI consult requested   Hyponatremia(present on admission):suspect beer potomania ,   Resolved  Generalized tonic-clonic seizures/acute toxic metabolic encephalopathy (presenting symptom) EEG: This study is within normal limits. No seizures or epileptiform discharges were seen throughout the recording.  MR brain: 1. Remote nonhemorrhagic lacunar infarcts of the basal ganglia bilaterally extending into the corona radiata. 2. Atrophy and white matter disease are mildly advanced for age. 3. No acute or focal intracranial abnormality to explain seizures or confusion. Neurology consulted,  Suspected provoked seizure in setting of alcohol withdrawal hyponatremia No seizure for the last several days.  Mental status improved. Neurology signed off  Hypokalemia, hypomagnesemia and hypophosphatemia A.m. lab not done today,  Keep on telemetry   Hypertension/hypertensive urgency: BP is uncontrolled.  Increase amlodipine to 10 mg daily and increase losartan. Start Coreg, as needed hydralazine  History of stroke in 03/2019, 05/2019: Plavix is on hold due to needing biopsy.  resumed. ldl 53, he is not on statin.   Rheumatoid arthritis/chronic immunosuppressed status, patient has been on prednisone 50 mg daily for years: Plaquenil, Enbrel have been held at this time-stress dose Solu-Cortef now transition to once daily prednisone 40 Has been cut back recently from 15 mg prednisone to 10 per brother, great gradually taper down prednisone back to baseline   Sterile polysubstance abuse :cocaine/EtOH/tobacco H/o vocal cord malignancy  COPD, compensated, no  wheezing, no hypoxia, continue bronchodilator  Depression: Continue Celexa and Risperdal  FTT,  PT, SNF placement  DVT Prophylaxis: Subcu Lovenox 40 mg daily  Code Status: Full  Family Communication: Brother Drew Nguyen over the phone, patient desires Drew Nguyen to be his healthcare power of attorney, Drew Nguyen agrees to  Disposition Plan:    Patient came from:                        home                                                                                  Anticipated d/c place: SNF  Barriers to d/c OR conditions which need to be met to effect a safe d/c:  Need oncology/rad onc clearance.  need palliative care consult   Consultants:  Pulmonary critical care  Oncology  Radiation oncology  Neurology  Palliative care  LBGI  Procedures:  EEG Flexible video fiberoptic bronchoscopy with endobronchial ultrasound and biopsies on March 2 by Dr. Shearon Stalls  Antibiotics:  Doxycycline, 2/26-completed 3/2   Objective: BP (!) 171/89 (BP Location: Left Arm)   Pulse 88   Temp 97.7 F (36.5 C) (Oral)   Resp 20   Ht 5' 6" (1.676 m)   Wt 70 kg   SpO2 92%   BMI 24.91 kg/m   Intake/Output Summary (Last 24 hours) at 10/24/2019 0813 Last data filed at 10/24/2019 7622 Gross per 24 hour  Intake 325 ml  Output 1300 ml  Net -975 ml   Filed Weights   10/08/2019 2057 11/13/2019 1126  Weight: 70 kg 70 kg    Exam: Patient is examined daily including today on 10/24/2019, exams remain the same as of yesterday except that has changed    General: Weak, chronic ill-appearing, AAOx3  Cardiovascular: RRR  Respiratory: CTABL  Abdomen: Soft/ND/NT, positive BS  Musculoskeletal: No Edema  Neuro: alert, oriented x3  Skin: Scattered ecchymosis, mostly upper extremities  Data Reviewed: Basic Metabolic Panel: Recent Labs  Lab 10/18/19 0631 10/18/19 1405 10/20/19 0117 10/20/19 6333 10/23/2019 0126 10/26/2019 1603 10/22/19 0959 10/23/19 1116  NA 133*   < > 139 137 140 141  --  143  K  2.6*   < > 3.1* 3.0* 2.7* 3.4*  --  3.5  CL 95*   < > 101 101 105 106  --  107  CO2 27   < > _0 --  22  GLUCOSE 123*   < > 131* 118* 174* 156*  --  188*  BUN 12   < > _1 --  11  CREATININE 0.64   < > 0.57* 0.65 0.58* 0.63  --  0.62  CALCIUM 7.7*   < > 8.2* 8.0* 7.8* 7.9*  --  8.3*  MG 1.8  --   --   --  1.6*  --  1.6* 1.5*  PHOS  --   --   --   --   --   --  1.5* 1.5*   < > = values in this interval not displayed.   Liver Function Tests: Recent Labs  Lab 10/19/19 0445 11/04/2019 0126 10/22/19 0959 10/23/19 1116  AST 98* 174* 131* 290*  ALT  212* 350* 326* 521*  ALKPHOS 144* 219* 230* 319*  BILITOT 0.8 0.9 0.9 1.1  PROT 5.3* 5.3* 5.8* 5.8*  ALBUMIN 2.9* 3.1* 3.2* 3.3*   No results for input(s): LIPASE, AMYLASE in the last 168 hours. Recent Labs  Lab 10/25/2019 1603 10/23/19 1116  AMMONIA 66* 67*   CBC: Recent Labs  Lab 10/19/19 0445 10/20/19 0117 10/23/19 1116  WBC 14.8* 17.0* 17.1*  NEUTROABS 13.0* 15.3* 15.0*  HGB 12.5* 13.9 13.3  HCT 36.2* 40.4 38.9*  MCV 92.1 92.4 92.0  PLT 286 260 208   Cardiac Enzymes:   No results for input(s): CKTOTAL, CKMB, CKMBINDEX, TROPONINI in the last 168 hours. BNP (last 3 results) No results for input(s): BNP in the last 8760 hours.  ProBNP (last 3 results) No results for input(s): PROBNP in the last 8760 hours.  CBG: No results for input(s): GLUCAP in the last 168 hours.  Recent Results (from the past 240 hour(s))  SARS CORONAVIRUS 2 (TAT 6-24 HRS) Nasopharyngeal Nasopharyngeal Swab     Status: None   Collection Time: 09/30/2019 11:59 PM   Specimen: Nasopharyngeal Swab  Result Value Ref Range Status   SARS Coronavirus 2 NEGATIVE NEGATIVE Final    Comment: (NOTE) SARS-CoV-2 target nucleic acids are NOT DETECTED. The SARS-CoV-2 RNA is generally detectable in upper and lower respiratory specimens during the acute phase of infection. Negative results do not preclude SARS-CoV-2 infection, do not rule  out co-infections with other pathogens, and should not be used as the sole basis for treatment or other patient management decisions. Negative results must be combined with clinical observations, patient history, and epidemiological information. The expected result is Negative. Fact Sheet for Patients: SugarRoll.be Fact Sheet for Healthcare Providers: https://www.woods-mathews.com/ This test is not yet approved or cleared by the [place] FDA and  has been authorized for detection and/or diagnosis of SARS-CoV-2 by FDA under an Emergency Use Authorization (EUA). This EUA will remain  in effect (meaning this test can be used) for the duration of the COVID-19 declaration under Section 56 4(b)(1) of the Act, 21 U.S.C. section 360bbb-3(b)(1), unless the authorization is terminated or revoked sooner. Performed at [facility], [place] [address] [place], [place] [address]   Culture, blood (Routine X 2) w Reflex to ID Panel     Status: None   Collection Time: 10/17/19  3:54 AM   Specimen: BLOOD  Result Value Ref Range Status   Specimen Description BLOOD LEFT HAND  Final   Special Requests   Final    BOTTLES DRAWN AEROBIC ONLY Blood Culture adequate volume   Culture   Final    NO GROWTH 5 DAYS Performed at [facility], [address], [place], [place] [address]    Report Status 10/22/2019 FINAL  Final  Culture, blood (Routine X 2) w Reflex to ID Panel     Status: None   Collection Time: 10/17/19  3:54 AM   Specimen: BLOOD  Result Value Ref Range Status   Specimen Description BLOOD RIGHT HAND  Final   Special Requests   Final    BOTTLES DRAWN AEROBIC ONLY Blood Culture results may not be optimal due to an inadequate volume of blood received in culture bottles   Culture   Final    NO GROWTH 5 DAYS Performed at [facility], [place] [address] [place], [place] [address]    Report Status 10/22/2019 FINAL  Final      Studies: No results found.  Scheduled Meds: . amLODipine  10 mg Oral Daily  . clopidogrel  75 mg Oral Daily  . enoxaparin (LOVENOX) injection  40 mg Subcutaneous Daily  . lactulose  20 g Oral BID  . losartan  25 mg Oral Daily  . mometasone-formoterol  2 puff Inhalation BID  . pantoprazole  40 mg Oral Daily  . potassium & sodium phosphates  1 packet Oral TID WC & HS  . potassium chloride  40 mEq Oral Daily  . predniSONE  40 mg Oral QAC breakfast  . risperiDONE  1 mg Oral QHS  . umeclidinium bromide  1 puff Inhalation Daily    Continuous Infusions:    Time spent: 66mns I have personally reviewed and interpreted on  10/24/2019 daily labs, tele strips, imagings as discussed above under date review session and assessment and plans.  I reviewed all nursing notes, pharmacy notes, consultant notes,  vitals, pertinent old records  I have discussed plan of care as described above with RN , patient and family on 10/24/2019   FFlorencia ReasonsMD, PhD, FACP  [facility]  Available via Epic secure chat 7am-7pm for nonurgent issues Please page for urgent issues, pager number available through [email or web address] .   10/24/2019, 8:13 AM  LOS: 7 days

## 2019-10-24 NOTE — Plan of Care (Signed)
  Problem: Education: Goal: Expressions of having a comfortable level of knowledge regarding the disease process will increase Outcome: Adequate for Discharge   Problem: Coping: Goal: Ability to adjust to condition or change in health will improve Outcome: Progressing Goal: Ability to identify appropriate support needs will improve Outcome: Progressing   Problem: Health Behavior/Discharge Planning: Goal: Compliance with prescribed medication regimen will improve Outcome: Progressing

## 2019-10-24 NOTE — Progress Notes (Signed)
Chaplain initiated a relationship of care and concern for patient and offered a ministry of presence.  Patient spoke of some things that were on his heart.  Chaplain offered prayer.  De Burrs Chaplain Resident

## 2019-10-24 NOTE — Progress Notes (Signed)
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 Small cell lung cancer, left [facility])  C34.92     HISTORY OF PRESENT ILLNESS: Drew Nguyen is a 64 y.o. male seen at the request of Dr. Maylon Peppers. He has a recent history of stroke in 05/2019 and presented to the ED on 09/26/2019 for possible generalized seizure but MRI did not show any acute intracranial abnormalities to explain seizures or confusion.  A chest x-ray performed upon arrival showed asymmetric hazy opacity in the left thorax with asymmetric density in the left hilus suspicious for lung mass versus diffuse lung parenchymal air disease.  Chest CT was recommended for further evaluation and was performed on 10/17/2019. This confirmed a left hilar mass with associated mediastinal and left hilar adenopathy, resulting in extrinsic compression of the left upper lobe pulmonary artery as well as complete obstruction of the left upper lobe bronchus and complete left upper lobe atelectasis.  Additionally, there was circumferential mural thickening of the distal thoracic esophagus and nonspecific left adrenal thickening.  He proceeded with bronchoscopy for tissue biopsy withDr. Shearon Stalls on 11/06/2019. A biopsy was obtained from the LUL mass, and final pathology revealed small cell carcinoma. Aspirations were also obtained from a level 10 lymph node and a LUL lymph node which confirmed the presence of malignant cells consistent with small cell carcinoma.  CT A/P was performed on 10/22/2019 for further disease staging and demonstrated a nonspecific, small left adrenal nodule as well as innumerable hypovascular areas in the liver, the largest measuring 3.2 cm, highly  suspicious for metastatic disease.  Medical oncology felt that his functional performance status was only borderline and would need improvement prior to being considered for systemic therapy due to the risks/side effects of treatment likely outweighing the benefits at present.  Therefore, we have been asked to consult this patient for consideration of the large, obstructing lung mass in an effort to prevent further lung collapse and symptomology.  Of note, he has a history of polysubstance abuse but his urine drug screen in the ER was negative and ethanol level was undetectable.  PREVIOUS RADIATION THERAPY: No  PAST MEDICAL HISTORY:  Past Medical History:  Diagnosis Date  . COPD (chronic obstructive pulmonary disease) ([place])   . Current smoker   . Depression   . Hypertension   . RA (rheumatoid arthritis) ([place])   . Tobacco abuse       PAST SURGICAL HISTORY: Past Surgical History:  Procedure Laterality Date  . ANKLE SURGERY    . BIOPSY  11/04/2019   Procedure: BIOPSY;  Surgeon: Spero Geralds, MD;  Location: [facility] ENDOSCOPY;  Service: Pulmonary;;  . BRONCHIAL BRUSHINGS  10/20/2019   Procedure: BRONCHIAL BRUSHINGS;  Surgeon: Spero Geralds, MD;  Location: [facility] ENDOSCOPY;  Service: Pulmonary;;  . BRONCHIAL NEEDLE ASPIRATION BIOPSY  11/11/2019   Procedure: BRONCHIAL NEEDLE ASPIRATION BIOPSIES;  Surgeon: Spero Geralds, MD;  Location: [facility] ENDOSCOPY;  Service: Pulmonary;;  . ENDOBRONCHIAL ULTRASOUND  11/10/2019   Procedure: ENDOBRONCHIAL ULTRASOUND;  Surgeon: Spero Geralds, MD;  Location: [facility] ENDOSCOPY;  Service: Pulmonary;;  . MULTIPLE TOOTH EXTRACTIONS    . VIDEO BRONCHOSCOPY WITH ENDOBRONCHIAL  ULTRASOUND N/A 10/28/2019   Procedure: VIDEO BRONCHOSCOPY;  Surgeon: Spero Geralds, MD;  Location: [facility] ENDOSCOPY;  Service: Pulmonary;  Laterality: N/A;    FAMILY HISTORY:  Family History  Problem Relation Age of Onset  . Hypertension Mother   . Heart attack Mother   . Heart attack Father   . Brain  cancer Brother   . Colon cancer Maternal Aunt   . Stomach cancer Neg Hx   . Esophageal cancer Neg Hx   . Pancreatic cancer Neg Hx     SOCIAL HISTORY:  Social History   Socioeconomic History  . Marital status: Single    Spouse name: Not on file  . Number of children: Not on file  . Years of education: Not on file  . Highest education level: Not on file  Occupational History  . Occupation: retired  Tobacco Use  . Smoking status: Current Every Day Smoker    Packs/day: 1.50    Years: 48.00    Pack years: 72.00    Types: Cigarettes  . Smokeless tobacco: Never Used  . Tobacco comment: 10/25/18 1 to 1 1/2 ppd  Substance and Sexual Activity  . Alcohol use: Yes    Comment: occ  . Drug use: Yes    Types: Cocaine, Benzodiazepines  . Sexual activity: Not on file  Other Topics Concern  . Not on file  Social History Narrative  . Not on file   Social Determinants of Health   Financial Resource Strain:   . Difficulty of Paying Living Expenses: Not on file  Food Insecurity:   . Worried About Charity fundraiser in the Last Year: Not on file  . Ran Out of Food in the Last Year: Not on file  Transportation Needs:   . Lack of Transportation (Medical): Not on file  . Lack of Transportation (Non-Medical): Not on file  Physical Activity:   . Days of Exercise per Week: Not on file  . Minutes of Exercise per Session: Not on file  Stress:   . Feeling of Stress : Not on file  Social Connections:   . Frequency of Communication with Friends and Family: Not on file  . Frequency of Social Gatherings with Friends and Family: Not on file  . Attends Religious Services: Not on file  . Active Member of Clubs or Organizations: Not on file  . Attends Archivist Meetings: Not on file  . Marital Status: Not on file  Intimate Partner Violence:   . Fear of Current or Ex-Partner: Not on file  . Emotionally Abused: Not on file  . Physically Abused: Not on file  . Sexually Abused: Not on  file    ALLERGIES: Celexa [citalopram]  MEDICATIONS:  No current facility-administered medications for this encounter.   No current outpatient medications on file.   Facility-Administered Medications Ordered in Other Encounters  Medication Dose Route Frequency Provider Last Rate Last Admin  . acetaminophen (TYLENOL) tablet 650 mg  650 mg Oral Q6H PRN Rise Patience, MD   650 mg at 10/23/19 2323   Or  . acetaminophen (TYLENOL) suppository 650 mg  650 mg Rectal Q6H PRN Rise Patience, MD      . amLODipine (NORVASC) tablet 10 mg  10 mg Oral Daily Jennye Boroughs, MD   10 mg at 10/23/19 0835  . clopidogrel (PLAVIX) tablet 75 mg  75 mg Oral Daily Florencia Reasons, MD      . enoxaparin (LOVENOX) injection 40 mg  40 mg  Subcutaneous Daily Rise Patience, MD   40 mg at 10/23/19 0836  . guaiFENesin-dextromethorphan (ROBITUSSIN DM) 100-10 MG/5ML syrup 5 mL  5 mL Oral Q4H PRN Nita Sells, MD   5 mL at 10/23/19 0247  . hydrALAZINE (APRESOLINE) injection 10 mg  10 mg Intravenous Q4H PRN Rise Patience, MD   10 mg at 10/24/19 0029  . lactulose (CHRONULAC) 10 GM/15ML solution 20 g  20 g Oral BID Nita Sells, MD   20 g at 10/23/19 2005  . LORazepam (ATIVAN) injection 1 mg  1 mg Intravenous Q6H PRN Rise Patience, MD   1 mg at 10/20/2019 0009  . LORazepam (ATIVAN) tablet 0.5 mg  0.5 mg Oral Q6H PRN Lang Snow, FNP   0.5 mg at 10/23/19 2330  . losartan (COZAAR) tablet 25 mg  25 mg Oral Daily Jennye Boroughs, MD   25 mg at 10/23/19 0836  . mometasone-formoterol (DULERA) 200-5 MCG/ACT inhaler 2 puff  2 puff Inhalation BID Rise Patience, MD   2 puff at 10/24/19 0753  . ondansetron (ZOFRAN) tablet 4 mg  4 mg Oral Q6H PRN Rise Patience, MD       Or  . ondansetron [facility]) injection 4 mg  4 mg Intravenous Q6H PRN Rise Patience, MD      . pantoprazole (PROTONIX) EC tablet 40 mg  40 mg Oral Daily Rise Patience, MD   40 mg at 10/23/19 0836  .  phenol (CHLORASEPTIC) mouth spray 1 spray  1 spray Mouth/Throat PRN Nita Sells, MD   1 spray at 10/19/19 1703  . potassium & sodium phosphates (PHOS-NAK) 280-160-250 MG packet 1 packet  1 packet Oral TID WC & HS Florencia Reasons, MD   1 packet at 10/24/19 [phone]  . potassium chloride (KLOR-CON) packet 40 mEq  40 mEq Oral Daily Florencia Reasons, MD      . predniSONE (DELTASONE) tablet 40 mg  40 mg Oral QAC breakfast Nita Sells, MD   40 mg at 10/24/19 0803  . risperiDONE (RISPERDAL M-TABS) disintegrating tablet 1 mg  1 mg Oral QHS Nita Sells, MD   1 mg at 10/23/19 2008  . umeclidinium bromide (INCRUSE ELLIPTA) 62.5 MCG/INH 1 puff  1 puff Inhalation Daily Margaretha Seeds, MD   1 puff at 10/24/19 0753    REVIEW OF SYSTEMS:  On review of systems, the patient reports that he is doing fair overall. He reports intermittent chest pain that responds to nitroglycerin, and some progressive shortness of breath with a mostly nonproductive cough, but has had some occasional hemoptysis recently.  He denies recent fevers, chills, or night sweats but admits to a recent unintended weight loss of approximately 10 pounds.  He denies any bowel or bladder disturbances, and denies abdominal pain, nausea or vomiting.  He denies any new musculoskeletal or joint aches or pains. A complete review of systems is obtained and is otherwise negative.    PHYSICAL EXAM:  Wt Readings from Last 3 Encounters:  11/09/2019 154 lb 5.2 oz (70 kg)  06/11/19 147 lb (66.7 kg)  06/05/19 140 lb (63.5 kg)   Temp Readings from Last 3 Encounters:  10/24/19 97.7 F (36.5 C) (Oral)  06/11/19 98 F (36.7 C) (Oral)  06/05/19 (!) 97.5 F (36.4 C)   BP Readings from Last 3 Encounters:  10/24/19 (!) 171/89  06/11/19 (!) 146/72  06/06/19 (!) 133/95   Pulse Readings from Last 3 Encounters:  10/24/19 88  06/11/19 71  06/06/19 72    /  10  In general this is an ill appearing Caucasian male in no acute distress. He is alert and  oriented to person and place and appropriate throughout the examination with occasional speech that was difficult to comprehend. HEENT reveals that the patient is normocephalic, atraumatic. EOMs are intact. Skin is intact without any evidence of gross lesions but with extensive ecchymosis of the bilateral upper extremities. Cardiovascular exam reveals a regular rate and rhythm, no clicks rubs or murmurs are auscultated. Chest is clear to auscultation bilaterally. Lymphatic assessment is performed and does not reveal any adenopathy in the cervical, supraclavicular, axillary, or inguinal chains. Abdomen has active bowel sounds in all quadrants and is intact. The abdomen is soft, non tender, non distended. Lower extremities are negative for pretibial pitting edema, deep calf tenderness, cyanosis or clubbing.   KPS = 50  100 - Normal; no complaints; no evidence of disease. 90   - Able to carry on normal activity; minor signs or symptoms of disease. 80   - Normal activity with effort; some signs or symptoms of disease. 42   - Cares for self; unable to carry on normal activity or to do active work. 60   - Requires occasional assistance, but is able to care for most of his personal needs. 50   - Requires considerable assistance and frequent medical care. 11   - Disabled; requires special care and assistance. 74   - Severely disabled; hospital admission is indicated although death not imminent. 28   - Very sick; hospital admission necessary; active supportive treatment necessary. 10   - Moribund; fatal processes progressing rapidly. 0     - Dead  Karnofsky DA, Abelmann Graham, Craver LS and Burchenal [facility] [phone]) The use of the nitrogen mustards in the palliative treatment of carcinoma: with particular reference to bronchogenic carcinoma Cancer 1 634-56  LABORATORY DATA:  Lab Results  Component Value Date   WBC 15.8 (H) 10/24/2019   HGB 12.5 (L) 10/24/2019   HCT 36.7 (L) 10/24/2019   MCV 93.6 10/24/2019    PLT 189 10/24/2019   Lab Results  Component Value Date   NA 143 10/23/2019   K 3.5 10/23/2019   CL 107 10/23/2019   CO2 22 10/23/2019   Lab Results  Component Value Date   ALT 521 (H) 10/23/2019   AST 290 (H) 10/23/2019   ALKPHOS 319 (H) 10/23/2019   BILITOT 1.1 10/23/2019     RADIOGRAPHY: EEG  Result Date: 10/17/2019 Lora Havens, MD     10/17/2019  1:38 PM Patient Name: SHRAY HUNLEY MRN: [id number] Epilepsy Attending: Lora Havens Referring Physician/Provider: Dr. Gean Birchwood Date: 10/17/2019 Duration: 25.26 minutes Patient history: 64 year old male with history of alcohol use, cocaine use who presented with generalized tonic-clonic seizure-like episode.  EEG to evaluate for seizures. Level of alertness: Awake AEDs during EEG study: Ativan Technical aspects: This EEG study was done with scalp electrodes positioned according to the 10-20 International system of electrode placement. Electrical activity was acquired at a sampling rate of 500Hz  and reviewed with a high frequency filter of 70Hz  and a low frequency filter of 1Hz . EEG data were recorded continuously and digitally stored. Description: The posterior dominant rhythm consists of 9-10 Hz activity of moderate voltage (25-35 uV) seen predominantly in posterior head regions, symmetric and reactive to eye opening and eye closing.  There is also 15 to 18 Hz beta activity  distributed symmetrically and diffusely.  Physiologic photic driving was seen during photic stimulation.  Hyperventilation was not performed. IMPRESSION: This study is within normal limits. No seizures or epileptiform discharges were seen throughout the recording. Lora Havens   CT Head Wo Contrast  Result Date: 10/13/2019 CLINICAL DATA:  Altered level of consciousness, seizure EXAM: CT HEAD WITHOUT CONTRAST TECHNIQUE: Contiguous axial images were obtained from the base of the skull through the vertex without intravenous contrast. COMPARISON:  06/05/2019,  06/06/2019 FINDINGS: Brain: There are chronic small vessel ischemic changes seen within the bilateral basal ganglia and periventricular white matter. No acute infarct or hemorrhage. Lateral ventricles and remaining midline structures are unremarkable. No acute extra-axial fluid collections. No mass effect. Vascular: No hyperdense vessel or unexpected calcification. Skull: Normal. Negative for fracture or focal lesion. Sinuses/Orbits: No acute finding. Other: None IMPRESSION: 1. Chronic small vessel ischemic changes, no acute intracranial process. Electronically Signed   By: Randa Ngo M.D.   On: 10/14/2019 22:54   CT CHEST W CONTRAST  Result Date: 10/17/2019 CLINICAL DATA:  Abnormal chest x-ray, pulmonary nodule, seizure, altered level of consciousness EXAM: CT CHEST WITH CONTRAST TECHNIQUE: Multidetector CT imaging of the chest was performed during intravenous contrast administration. CONTRAST:  4mL OMNIPAQUE IOHEXOL 300 MG/ML  SOLN COMPARISON:  09/26/2019, 03/04/2019 FINDINGS: Cardiovascular: The heart is unremarkable without pericardial effusion. Thoracic aorta is normal in caliber without aneurysm or dissection. No filling defects or pulmonary emboli. Extrinsic compression upon the left pulmonary artery due to left hilar mass. Moderate atherosclerosis of the coronary vasculature, with mild calcified plaque of the aortic arch. Mediastinum/Nodes: There is a lobular soft tissue mass in the mediastinum and AP window, measuring 4.7 x 5.3 cm. Large left hilar mass with obstruction of the left upper lobe bronchus. No contralateral hilar adenopathy. There is circumferential wall thickening of the distal thoracic esophagus, nonspecific. Endoscopy may be useful. Lungs/Pleura: There is complete collapse of the left upper lobe due to a large left hilar mass causing abrupt cut off of the left upper lobe bronchus. Distinguishing the left hilar mass from the consolidated left upper lobe is difficult. PET scan  recommended for further evaluation. Background emphysema. Minimal tree in bud ground-glass airspace disease within the left lower lobe likely inflammatory or infectious. There is narrowing of the left lower lobe bronchus with mucoid material filling the posterior basilar segmental bronchi. No effusion or pneumothorax. Upper Abdomen: Thickening of the left adrenal gland measuring 12 mm, suspicious for metastases. Remainder of the upper abdomen is unremarkable. Musculoskeletal: No acute or destructive bony lesions. Reconstructed images demonstrate no additional findings. IMPRESSION: 1. 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 This mass results in extrinsic compression of the left upper lobe pulmonary artery, as well as complete obstruction of the left upper lobe bronchus and complete left upper lobe atelectasis. 2. Circumferential mural thickening of the distal thoracic esophagus. Endoscopy recommended for further evaluation. 3. Minimal left lower lobe tree-in-bud ground-glass airspace disease consistent with inflammation or infection. 4. Left adrenal thickening, nonspecific. Metastatic disease not excluded. 5.  Emphysema (ICD10-J43.9). Electronically Signed   By: Randa Ngo M.D.   On: 10/17/2019 02:01   MR BRAIN W WO CONTRAST  Result Date: 10/17/2019 CLINICAL DATA:  Seizure. Abnormal neuro exam. Hypertension. Patient stopped drinking daily alcohol 1 week ago. Confusion. EXAM: MRI HEAD WITHOUT AND WITH CONTRAST TECHNIQUE: Multiplanar, multiecho pulse sequences of the brain and surrounding structures were obtained without and with intravenous contrast. CONTRAST:  36mL GADAVIST GADOBUTROL 1 MMOL/ML IV SOLN COMPARISON:  CT head without contrast  10/04/2019. MRI of the head 04/19/2019 FINDINGS: Brain: The diffusion-weighted images demonstrate no acute or subacute infarct. Remote lacunar infarcts are present within the basal ganglia extending superiorly into  the corona radiata bilaterally. Mild atrophy and white matter disease is present bilaterally, otherwise stable. The basal ganglia are otherwise unremarkable. Insular ribbon is within limits bilaterally. Study is mildly degraded by patient motion. Temporal lobes are normal. The internal auditory canals are normal bilaterally. The brainstem and cerebellum are normal. Postcontrast images demonstrate no pathologic enhancement. Vascular: Flow is present in the major intracranial arteries. Skull and upper cervical spine: The craniocervical junction is normal. Upper cervical spine is within normal limits. Marrow signal is unremarkable. Sinuses/Orbits: The paranasal sinuses and mastoid air cells are clear. The globes and orbits are within normal limits. IMPRESSION: 1. Remote nonhemorrhagic lacunar infarcts of the basal ganglia bilaterally extending into the corona radiata. 2. Atrophy and white matter disease are mildly advanced for age. 3. No acute or focal intracranial abnormality to explain seizures or confusion. Electronically Signed   By: San Morelle M.D.   On: 10/17/2019 05:30   CT ABDOMEN PELVIS W CONTRAST  Result Date: 10/22/2019 CLINICAL DATA:  64 year old male with history of adrenal mass suspicious for malignancy. Follow-up study. EXAM: CT ABDOMEN AND PELVIS WITH CONTRAST TECHNIQUE: Multidetector CT imaging of the abdomen and pelvis was performed using the standard protocol following bolus administration of intravenous contrast. CONTRAST:  175mL OMNIPAQUE IOHEXOL 300 MG/ML  SOLN COMPARISON:  No priors. FINDINGS: Lower chest: Atherosclerotic calcifications in the left circumflex and right coronary arteries. Hepatobiliary: Multiple ill-defined hypovascular lesions are scattered throughout the hepatic parenchyma, highly concerning for widespread metastatic disease. One specific example is the lesion at the junction of segment 8 and the caudate lobe (axial image 19 of series 3) measuring 3.2 x 2.1 cm. No  intra or extrahepatic biliary ductal dilatation. Gallbladder is nearly completely collapsed with avid enhancement of the gallbladder mucosa and diffuse wall edema, without surrounding inflammatory changes. Pancreas: No pancreatic mass. No pancreatic ductal dilatation. No pancreatic or peripancreatic fluid collections or inflammatory changes. Spleen: Unremarkable. Adrenals/Urinary Tract: Subcentimeter low-attenuation lesions in both kidneys, too small to characterize, but statistically likely to represent cysts. No hydroureteronephrosis. Urinary bladder is normal in appearance. Right adrenal gland is normal. 1.2 cm left adrenal nodule (axial image 23 of series 3), similar to the prior chest CT 10/06/2019. Stomach/Bowel: Normal appearance of the stomach. No pathologic dilatation of small bowel or colon. Numerous colonic diverticulae are noted, without surrounding inflammatory changes to suggest an acute diverticulitis at this time. Normal appendix. Vascular/Lymphatic: Aortic atherosclerosis, without evidence of aneurysm or dissection in the abdominal or pelvic vasculature. No lymphadenopathy noted in the abdomen or pelvis. Reproductive: Prostate gland and seminal vesicles are unremarkable in appearance. Other: No significant volume of ascites.  No pneumoperitoneum. Musculoskeletal: There are no aggressive appearing lytic or blastic lesions noted in the visualized portions of the skeleton. IMPRESSION: 1. Small left adrenal nodule is nonspecific. This could either be a benign or malignant lesion. 2. Importantly, there are innumerable hypovascular areas in the liver which are highly suspicious for metastatic disease. This could be better evaluated with follow-up abdominal MRI with and without IV gadolinium. 3. Colonic diverticulosis without evidence of acute diverticulitis at this time. 4. Additional incidental findings, as above. Electronically Signed   By: Vinnie Langton M.D.   On: 10/22/2019 16:40   DG Chest  Portable 1 View  Result Date: 10/02/2019 CLINICAL DATA:  Seizure, altered mental status EXAM:  PORTABLE CHEST 1 VIEW COMPARISON:  06/06/2019 FINDINGS: Right lung is grossly clear. Asymmetric hazy opacification in the left thorax with increased left hilar density. Heart size upper limits of normal. No pneumothorax. IMPRESSION: Asymmetric hazy opacity in left thorax with asymmetric density in the left hilus, could be due to diffuse lung parenchymal air disease versus lung mass. Chest CT is recommended for further evaluation. Electronically Signed   By: Donavan Foil M.D.   On: 10/05/2019 22:42      IMPRESSION/PLAN: 1. 64 y.o. gentleman with newly diagnosed small cell lung cancer in the LUL, at least stage III but quite possibly stage IV, given multiple liver lesions on recent CT. Today, we talked to the patient about the findings and workup thus far. We discussed the natural history of small cell carcinoma and general treatment, highlighting the role of radiotherapy in the management.  We discussed that this is often combined with chemotherapy but that medical oncology does not currently feel that he is strong enough to tolerate systemic therapy at this time.  We also discussed the need to proceed with MRI of the liver for further evaluation of the multiple liver lesions noted on his recent CT A/P to better determine if these represent metastatic disease, making his cancer a stage IV.  At present, the recommendation would be to proceed with definitive radiotherapy with curative intent but he understands that our treatment recommendations may change based on findings on upcoming imaging.  We also recommend a GI consult while inpatient to further evaluate the esophageal thickening noted on CT. We discussed the available radiation techniques, and focused on the details of logistics of delivery. We reviewed the anticipated acute and late sequelae associated with radiation in this setting. The patient was encouraged  to ask questions that were answered to his stated satisfaction.  He has requested that I call and speak with his brother Courtez Twaddle whom he intends to make his healthcare power of attorney.  At the conclusion of our conversation, the patient is in agreement to proceed with a 6-1/2-week course of daily radiotherapy to the left upper lung mass, pending there is no evidence for extensive metastatic disease on further imaging.  He has freely signed written consent to proceed today in the office and a copy of this document will be placed in his chart.  He will undergo CT simulation/treatment planning following our visit today in anticipation of proceeding with his first treatment this afternoon.  We will obtain a stat MRI liver to further assist with disease staging and he will need a PET scan at discharge to complete his disease staging.  He appears to have a good understanding of his disease and our treatment recommendations which are currently of curative intent and is in agreement with the stated plan.  I will call his brother, Conlee Sliter, as requested and share this discussion with him also.   2. Prophylactic cranial irradiation.  Given the patient's pathology with small cell lung cancer, he will likely benefit from prophylactic cranial irradiation.  This will be discussed in further detail once he completes his definitive treatment and is no longer requiring systemic chemotherapy.  In a visit lasting 70 minutes, greater than 50% of that time was spent in floor time, discussing his case and coordinating his care.   Nicholos Johns, PA-C    Tyler Pita, MD  [facility] Direct Dial: [phone]  Fax: [phone] [email or web address]  Skype  [email or web address]   This document serves as  a record of services personally performed by Tyler Pita, MD and Freeman Caldron, PA-C. It was created on their behalf by Wilburn Mylar, a trained medical scribe. The creation of this record is  based on the scribe's personal observations and the provider's statements to them. This document has been checked and approved by the attending provider.

## 2019-10-24 NOTE — Progress Notes (Signed)
Report given to Nicole Kindred with [facility]

## 2019-10-24 NOTE — TOC Initial Note (Signed)
Transition of Care [facility]) - Initial/Assessment Note    Patient Details  Name: Drew Nguyen MRN: [id number] Date of Birth: 25-Jul-1956  Transition of Care [facility]) CM/SW Contact:    Trish Mage, LCSW Phone Number: 10/24/2019, 10:49 AM  Clinical Narrative:  Patient diagnosed with small cell lung cancer, L, as well as finding of innumerable hypovascular areas in the liver highly suspicious for metastatic disease, also hx of RA, strokes lives at home in [place] with wife, 3 minor children and has a full time aide living in the home as well.  He states that beside his multiple medical issues, wife is diagnosed with schizophrenia and is not medication compliant.  He quickly adds that she is not a danger to the family or herself, but readily admits that she goes through spells of significant paranoia.  DME at home includes cane and rolling walker, but he also makes it clear that he would like a wheelchair, rollator and 3 in 1 as he intends to return home; has no interest in going to snf as "there is lots of COVID there, and home is where I am needed." Besides the DME, we also talked about applying the for the CAP program for in home support as he states he is paying for the aide out of pocket.  His PCP is Paediatric nurse [facility].  With his permission, I called brother, who states that he disagrees with plan to return home.  "He is not even able to ambulate at this point.  He is bed bound."  Brother states he and wife are scheduled to meet with patient and Palliative Dr today at 19, and he is confident that a solid dispositional plan will be nailed down at that meeting. TOC will continue to follow during the course of hospitalization.                 Expected Discharge Plan: [place] [facility] Barriers to Discharge: Other (comment)(finding [facility] provider for MCD)   Patient Goals and CMS Choice Patient states their goals for this hospitalization and ongoing recovery are:: "I am planning on  going home from here, and this is what I need." CMS Medicare.gov Compare Post Acute Care list provided to:: Patient Choice offered to / list presented to : Patient  Expected Discharge Plan and Services Expected Discharge Plan: [place]   Discharge Planning Services: CM Consult Post Acute Care Choice: Durable Medical Equipment, Home Health Living arrangements for the past 2 months: Single Family Home                                      Prior Living Arrangements/Services Living arrangements for the past 2 months: Single Family Home Lives with:: Spouse, Minor Children Patient language and need for interpreter reviewed:: Yes Do you feel safe going back to the place where you live?: Yes      Need for Family Participation in Patient Care: Yes (Comment) Care giver support system in place?: Yes (comment) Current home services: DME Criminal Activity/Legal Involvement Pertinent to Current Situation/Hospitalization: No - Comment as needed  Activities of Daily Living Home Assistive Devices/Equipment: Gilford Rile (specify type) ADL Screening (condition at time of admission) Patient's cognitive ability adequate to safely complete daily activities?: Yes Is the patient deaf or have difficulty hearing?: No Does the patient have difficulty seeing, even when wearing glasses/contacts?: No Does the patient  have difficulty concentrating, remembering, or making decisions?: No Patient able to express need for assistance with ADLs?: Yes Does the patient have difficulty dressing or bathing?: No Independently performs ADLs?: Yes (appropriate for developmental age) Does the patient have difficulty walking or climbing stairs?: Yes Weakness of Legs: Both Weakness of Arms/Hands: None  Permission Sought/Granted Permission sought to share information with : Family Supports Permission granted to share information with : Yes, Verbal Permission Granted  Share Information with NAME: Jabari Swoveland     Permission granted to share info w Relationship: brother     Emotional Assessment Appearance:: Appears stated age Attitude/Demeanor/Rapport: Engaged Affect (typically observed): Appropriate Orientation: : Oriented to Self, Oriented to Place, Oriented to Situation Alcohol / Substance Use: Alcohol Use Psych Involvement: No (comment)  Admission diagnosis:  Hypokalemia [E87.6] Hyponatremia [E87.1] Seizure ([place]) [R56.9] Patient Active Problem List   Diagnosis Date Noted  . Small cell lung cancer, left ([place])   . Hilar adenopathy   . Seizure ([place]) 10/17/2019  . Hyponatremia 10/17/2019  . Hypokalemia   . Acute ischemic stroke ([place]) 06/06/2019  . 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 Stroke ([place]) 06/06/2019  . CVA (cerebral vascular accident) ([place]) 04/19/2019  . Essential hypertension 04/19/2019  . Cocaine abuse ([place]) 04/19/2019  . Adjustment disorder 04/19/2019  . Dysphagia 10/25/2018  . Tobacco abuse 09/13/2018  . COPD GOLD I, still smoking 09/13/2018  . Rheumatoid arthritis ([place]) 09/13/2018   PCP:  Boyce Medici, FNP Pharmacy:   [facility], [address] [place] [address] Phone: [phone] Fax: [phone]     Social Determinants of Health (SDOH) Interventions    Readmission Risk Interventions No flowsheet data found.

## 2019-10-24 NOTE — Consult Note (Signed)
Consultation Note Date: 10/24/2019   Patient Name: Drew Nguyen  DOB: [birth year]  MRN: [id number]  Age / Sex: 64 y.o., male  PCP: Boyce Medici, FNP Referring Physician: Flora Lipps, MD  Reason for Consultation: Establishing goals of care and Psychosocial/spiritual support  HPI/Patient Profile: 64 y.o. male  with past medical history of CVA x 2, rheumatoid arthritis, COPD with on-going tobacco use, depression, and poly substance abuse who was admitted on 10/10/2019 with tonic clonic seizure presumably brought on by alcohol withdraw.  During his work up a CT chest showed a large left sided lung mass and possible liver mets.  Bronchoscopy was performed and pathology showed small cell lung cancer.  The patient has also suffered with delirium intermittently thru out the hospitalization.    Clinical Assessment and Goals of Care:  I have reviewed medical records including EPIC notes, labs and imaging, received report from the care team, examined the patient and met at bedside with his brother [name] and SIL Butch Penny  to discuss diagnosis prognosis, [place], EOL wishes, disposition and options.  I introduced Palliative Medicine as specialized medical care for people living with serious illness. It focuses on providing relief from the symptoms and stress of a serious illness.   At the beginning of our conversation [name] explained that his brother will make his medical decisions for him.  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  We discussed a brief life review of the patient.  [name] has 3 young daughters ages 49, 63, and 26.  The mother of his children is [name].  [name] and [name] have been together for 18 years but are not married.  Per Giacomo's brother [name], [name] is the primary care taker for his daughters and they are the center of his life. 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  He suffered with rheumatoid arthritis.  He quit work 3 years ago due to pain.  At times Carston could barely walk.  In the past year he had a drastic decline.  [name] states his brother had no energy and began to really struggle taking care of the kids.    We discussed his current illness and what it means in the larger context of his on-going co-morbidities.  [name] is aware that he has small cell lung cancer that has spread to his liver.   I attempted to elicit values and goals of care important to the patient.   Unfortunately [name] was very tearful and we could not have an in-depth conversation.  [name] again deferred to his brother to make decisions for him. Advanced directives, concepts specific to code status, artifical feeding and hydration, and rehospitalization were considered and discussed.  We reviewed a living will together.    Also discussed [name] obtaining durable POA from [facility] as he will likely have recurrent encephalopathy associated with liver disease.   Questions and concerns were addressed.  The family was encouraged to  call with questions or concerns.    Primary Decision Maker:  HCPOA His brother Azel Gumina.  He also depends largely on his sister in law Kei Mcelhiney.    SUMMARY OF RECOMMENDATIONS    1.  Patient requests that his brother [name] make his medical decisions for him. 2.  When asked [name] requested to remain a full code.  He has young daughters and wants to live for them  "Try Once, if it doesn't work then let me go".   I would like to have further conversation about this with [name] at some point. 3.  Family will complete the Advanced Directive document.  They request help from the Chaplain for notary on Tuesday.   Code Status/Advance Care Planning:  Full code "Try it once, if it fails let me go".      Symptom Management:   Per primary.   Additional Recommendations (Limitations, Scope, Preferences):  Full Scope  Treatment  Palliative Prophylaxis:   Frequent Pain Assessment  Psycho-social/Spiritual:   Desire for further Chaplaincy support: appreciated.  Prognosis: without treatment less than 6 months.  Hopefully [name] will improve and be able to take therapies.  He does not want to suffer but does want to live as long as possible for his daughters.    Discharge Planning: To Be Determined      Primary Diagnoses: Present on Admission: . Hyponatremia . Rheumatoid arthritis ([name]) . Essential hypertension . CVA (cerebral vascular accident) ([name]) . COPD GOLD I, still smoking . Small cell lung cancer, left ([name])   I have reviewed the medical record, interviewed the patient and family, and examined the patient. The following aspects are pertinent.  Past Medical History:  Diagnosis Date  . COPD (chronic obstructive pulmonary disease) ([place])   . Current smoker   . Depression   . Hypertension   . RA (rheumatoid arthritis) ([place])   . Tobacco abuse    Social History   Socioeconomic History  . Marital status: Single    Spouse name: Not on file  . Number of children: Not on file  . Years of education: Not on file  . Highest education level: Not on file  Occupational History  . Occupation: retired  Tobacco Use  . Smoking status: Current Every Day Smoker    Packs/day: 1.50    Years: 48.00    Pack years: 72.00    Types: Cigarettes  . Smokeless tobacco: Never Used  . Tobacco comment: 10/25/18 1 to 1 1/2 ppd  Substance and Sexual Activity  . Alcohol use: Yes    Comment: occ  . Drug use: Yes    Types: Cocaine, Benzodiazepines  . Sexual activity: Not on file  Other Topics Concern  . Not on file  Social History Narrative  . Not on file   Social Determinants of Health   Financial Resource Strain:   . Difficulty of Paying Living Expenses: Not on file  Food Insecurity:   . Worried About Charity fundraiser in the Last Year: Not on file  . Ran Out of Food in the Last Year: Not on  file  Transportation Needs:   . Lack of Transportation (Medical): Not on file  . Lack of Transportation (Non-Medical): Not on file  Physical Activity:   . Days of Exercise per Week: Not on file  . Minutes of Exercise per Session: Not on file  Stress:   . Feeling of Stress : Not on file  Social Connections:   . Frequency of Communication  with Friends and Family: Not on file  . Frequency of Social Gatherings with Friends and Family: Not on file  . Attends Religious Services: Not on file  . Active Member of Clubs or Organizations: Not on file  . Attends Archivist Meetings: Not on file  . Marital Status: Not on file   Family History  Problem Relation Age of Onset  . Hypertension Mother   . Heart attack Mother   . Heart attack Father   . Brain cancer Brother   . Colon cancer Maternal Aunt   . Stomach cancer Neg Hx   . Esophageal cancer Neg Hx   . Pancreatic cancer Neg Hx    Scheduled Meds: . amLODipine  10 mg Oral Daily  . clopidogrel  75 mg Oral Daily  . enoxaparin (LOVENOX) injection  40 mg Subcutaneous Daily  . lactulose  20 g Oral BID  . losartan  25 mg Oral Daily  . mometasone-formoterol  2 puff Inhalation BID  . pantoprazole  40 mg Oral Daily  . potassium & sodium phosphates  1 packet Oral TID WC & HS  . potassium chloride  40 mEq Oral Daily  . predniSONE  40 mg Oral QAC breakfast  . risperiDONE  1 mg Oral QHS  . umeclidinium bromide  1 puff Inhalation Daily   Continuous Infusions: PRN Meds:.acetaminophen **OR** acetaminophen, guaiFENesin-dextromethorphan, hydrALAZINE, LORazepam, [EXPIRED] LORazepam **FOLLOWED BY** [EXPIRED] LORazepam **FOLLOWED BY** [EXPIRED] LORazepam **FOLLOWED BY** LORazepam, ondansetron **OR** ondansetron (ZOFRAN) IV, phenol Allergies  Allergen Reactions  . Celexa [Citalopram] Other (See Comments)    Over sedation Sleeps too much    Review of Systems  Complains of pain all over, trouble swallowing  Physical Exam  Well developed  chronically ill appearing gentleman. Voice is gurgly and speech is sometimes difficult to understand CV rrr resp no distress Abdomen soft, nt, nd   Vital Signs: BP (!) 171/89 (BP Location: Left Arm)   Pulse 88   Temp 97.7 F (36.5 C) (Oral)   Resp 20   Ht 5' 6"  (1.676 m)   Wt 70 kg   SpO2 92%   BMI 24.91 kg/m  Pain Scale: 0-10   Pain Score: 0-No pain   SpO2: SpO2: 92 % O2 Device:SpO2: 92 % O2 Flow Rate: .O2 Flow Rate (L/min): 2 L/min  IO: Intake/output summary:   Intake/Output Summary (Last 24 hours) at 10/24/2019 1016 Last data filed at 10/24/2019 1610 Gross per 24 hour  Intake 325 ml  Output 1300 ml  Net -975 ml    LBM: Last BM Date: 10/22/19 Baseline Weight: Weight: 70 kg Most recent weight: Weight: 70 kg     Palliative Assessment/Data: 30%     Time In: 4:00 Time Out: 5:10 Time Total: 70 min. Visit consisted of counseling and education dealing with the complex and emotionally intense issues surrounding the need for palliative care and symptom management in the setting of serious and potentially life-threatening illness. Greater than 50%  of this time was spent counseling and coordinating care related to the above assessment and plan.  Signed by: Florentina Jenny, PA-C Palliative Medicine  Please contact Palliative Medicine Team phone at [phone] for questions and concerns.  For individual provider: See Shea Evans

## 2019-10-24 NOTE — Progress Notes (Signed)
OT Cancellation Note  Patient Details Name: EFREN KROSS MRN: [id number] DOB: 10/11/1955   Cancelled Treatment:    Reason Eval/Treat Not Completed: Pt moved from [facility] to [facility].  Malka So 10/24/2019, 10:44 AM  Nestor Lewandowsky, OTR/L Acute Rehabilitation Services Pager: [phone] Office: [phone]

## 2019-10-24 NOTE — Consult Note (Addendum)
Referring Provider:  Dr. Erlinda Hong, [facility] Primary Care Physician:  Boyce Medici, FNP Primary Gastroenterologist:  Dr. Loletha Carrow  Reason for Consultation:  Dysphagia, abnormal CT of the esophagus  HPI: Drew Nguyen is a 64 y.o. male who has a recent history of stroke in 05/2019, is on Plavix. He presented to the ED on 09/29/2019 following a seizure that was felt to be related to alcohol withdrawal. Brain MRI showed: remote nonhemorrhagic lacunar infarcts of basal ganglia bilaterally; no acute or focal intracranial abnormality to explain seizures or confusion.  He underwent chest CT after a chest x-ray performed upon arrival was abnormal. The CT revealed: left hilar mass, and associated mediastinal and left hilar adenopathy, resulting in extrinsic compression of the left upper lobe pulmonary artery as well as complete obstruction of left upper lobe bronchus and complete left upper lobe atelectasis; circumferential mural thickening of distal thoracic esophagus; nonspecific left adrenal thickening.  He was taken for bronchoscopy with biopsies on 11/05/2019 under Dr. Shearon Stalls. A biopsy was obtained from the LUL mass, and pathology revealed small cell carcinoma. Aspirations were also obtained from a level 10 lymph node and a LUL lymph node, and cytology confirmed the presence of malignant cells consistent with small cell carcinoma.  He underwent CT abdomen/pelvis on 10/22/2019 to further evaluate the left adrenal thickening seen on chest CT. This showed: nonspecific small left adrenal nodule; innumerable hypovascular areas in the liver highly suspicious for metastatic disease.  GI being consulted for the esophageal thickening seen on CT scan of the chest and complaints of dysphagia/odynophagia.   GI history as follows:  Colonoscopy 01/2019:  - One 2 mm polyp in the cecum, removed with a cold biopsy forceps. Resected and retrieved. - Two 4-5 mm polyps in the transverse colon and in the ascending colon, removed with  a cold snare. Resected and retrieved. - Diverticulosis in the left colon. - The examination was otherwise normal on direct and retroflexion views.  EGD 01/2019:  - Laryngeal edema was found. - Ritta Slot was found in the oropharynx. - Esophageal plaques were found, consistent with candidiasis. Biopsied. - Tortuous esophagus. - Gastritis. Biopsied. - Normal examined duodenum.  Pathology:  Polyps were adenomas with recall in 5 years.  Hpylori seen and treated, but urea breath test not complete as recommended post-treatment.  Fungal elements seen on stains.  This was treated as well.   He is difficult to understand at times due to raspiness in his voice and somewhat mumbled speech, but from what he describes he has had ongoing difficulty swallowing as well as painful swallowing with food, pills, and liquids.  He says that it did seem to get somewhat better for a while after his endoscopy and taking some medication, but has somewhat progressed again.  Reviewing his outpatient medication list he is also on several medications that could potentially produce some esophagitis issues.  These include Fosamax, Mobic, chronic prednisone, and potassium chloride.  He uses steroid inhalers for his COPD.  Is on Plavix and aspirin 81 mg daily as an outpatient.  Past Medical History:  Diagnosis Date  . COPD (chronic obstructive pulmonary disease) ([place])   . Current smoker   . Depression   . Hypertension   . RA (rheumatoid arthritis) ([place])   . Tobacco abuse     Past Surgical History:  Procedure Laterality Date  . ANKLE SURGERY    . BIOPSY  10/25/2019   Procedure: BIOPSY;  Surgeon: Spero Geralds, MD;  Location: [facility] ENDOSCOPY;  Service: Pulmonary;;  . BRONCHIAL BRUSHINGS  11/10/2019   Procedure: BRONCHIAL BRUSHINGS;  Surgeon: Spero Geralds, MD;  Location: [facility] ENDOSCOPY;  Service: Pulmonary;;  . BRONCHIAL NEEDLE ASPIRATION BIOPSY  11/04/2019   Procedure: BRONCHIAL NEEDLE ASPIRATION BIOPSIES;  Surgeon:  Spero Geralds, MD;  Location: [facility] ENDOSCOPY;  Service: Pulmonary;;  . ENDOBRONCHIAL ULTRASOUND  10/20/2019   Procedure: ENDOBRONCHIAL ULTRASOUND;  Surgeon: Spero Geralds, MD;  Location: [facility] ENDOSCOPY;  Service: Pulmonary;;  . MULTIPLE TOOTH EXTRACTIONS    . VIDEO BRONCHOSCOPY WITH ENDOBRONCHIAL ULTRASOUND N/A 11/05/2019   Procedure: VIDEO BRONCHOSCOPY;  Surgeon: Spero Geralds, MD;  Location: [facility] ENDOSCOPY;  Service: Pulmonary;  Laterality: N/A;    Prior to Admission medications   Medication Sig Start Date End Date Taking? Authorizing Provider  acetaminophen (TYLENOL) 500 MG tablet Take 500 mg by mouth every 6 (six) hours as needed for mild pain, fever or headache.    Yes [provider]  alendronate (FOSAMAX) 70 MG tablet Take 70 mg by mouth once a week. 10/03/19  Yes [provider]  amLODipine (NORVASC) 5 MG tablet Take 1 tablet (5 mg total) by mouth daily. 04/20/19 04/19/20 Yes Danford, Suann Larry, MD  CALCIUM PO Take 1 tablet by mouth daily.   Yes [provider]  cholecalciferol (VITAMIN D) 25 MCG tablet Take 1 tablet (1,000 Units total) by mouth daily. 06/07/19 10/17/19 Yes Dahal, Marlowe Aschoff, MD  citalopram (CELEXA) 10 MG tablet Take 10 mg by mouth daily.   Yes [provider]  clopidogrel (PLAVIX) 75 MG tablet Take 1 tablet (75 mg total) by mouth daily. 06/07/19 10/17/19 Yes Dahal, Marlowe Aschoff, MD  ENBREL 50 MG/ML injection Inject 1 mL into the skin once a week. 10/14/19  Yes [provider]  EQ ASPIRIN ADULT LOW DOSE 81 MG EC tablet Take 81 mg by mouth daily. 07/12/19  Yes [provider]  furosemide (LASIX) 20 MG tablet Take 10-20 mg by mouth See admin instructions. Take 20mg  in the morning and 10mg  in the evening. 10/14/19  Yes [provider]  hydroxychloroquine (PLAQUENIL) 200 MG tablet Take 200 mg by mouth daily. 10/08/19  Yes [provider]  ipratropium (ATROVENT) 0.03 % nasal spray Place 2 sprays into both nostrils 2 (two)  times daily as needed for rhinitis.  08/05/19  Yes [provider]  losartan (COZAAR) 25 MG tablet Take 25 mg by mouth daily. 10/14/19  Yes [provider]  meloxicam (MOBIC) 7.5 MG tablet Take 7.5 mg by mouth See admin instructions. 1 to 2 times daily    Yes [provider]  nitroGLYCERIN (NITROSTAT) 0.4 MG SL tablet Place 0.4 mg under the tongue every 5 (five) minutes as needed for chest pain. 10/14/19  Yes [provider]  omeprazole (PRILOSEC) 20 MG capsule Take 1 capsule (20 mg total) by mouth daily. 06/06/19 10/17/19 Yes Dahal, Marlowe Aschoff, MD  potassium chloride (KLOR-CON) 10 MEQ tablet Take 10 mEq by mouth 2 (two) times daily.   Yes [provider]  predniSONE (DELTASONE) 10 MG tablet Take 5 mg by mouth in the morning and at bedtime.  08/23/18  Yes [provider]  rosuvastatin (CRESTOR) 5 MG tablet Take 5 mg by mouth every evening.   Yes [provider]  SPIRIVA HANDIHALER 18 MCG inhalation capsule Place 18 mcg into inhaler and inhale daily. 10/15/19  Yes [provider]    Current Facility-Administered Medications  Medication Dose Route Frequency Provider Last Rate Last Admin  . acetaminophen (TYLENOL)  tablet 650 mg  650 mg Oral Q6H PRN Rise Patience, MD   650 mg at 10/23/19 2323   Or  . acetaminophen (TYLENOL) suppository 650 mg  650 mg Rectal Q6H PRN Rise Patience, MD      . amLODipine (NORVASC) tablet 10 mg  10 mg Oral Daily Jennye Boroughs, MD   10 mg at 10/24/19 1049  . carvedilol (COREG) tablet 3.125 mg  3.125 mg Oral BID WC Florencia Reasons, MD      . clopidogrel (PLAVIX) tablet 75 mg  75 mg Oral Daily Florencia Reasons, MD   75 mg at 10/24/19 1346  . enoxaparin (LOVENOX) injection 40 mg  40 mg Subcutaneous Daily Rise Patience, MD   40 mg at 10/24/19 1049  . guaiFENesin-dextromethorphan (ROBITUSSIN DM) 100-10 MG/5ML syrup 5 mL  5 mL Oral Q4H PRN Nita Sells, MD   5 mL at 10/23/19 0247  . hydrALAZINE  (APRESOLINE) injection 10 mg  10 mg Intravenous Q4H PRN Rise Patience, MD   10 mg at 10/24/19 1354  . lactulose (CHRONULAC) 10 GM/15ML solution 20 g  20 g Oral BID Nita Sells, MD   20 g at 10/24/19 1346  . LORazepam (ATIVAN) injection 1 mg  1 mg Intravenous Q6H PRN Rise Patience, MD   1 mg at 11/17/2019 0009  . LORazepam (ATIVAN) tablet 0.5 mg  0.5 mg Oral Q6H PRN Lang Snow, FNP   0.5 mg at 10/24/19 1355  . [START ON 10/25/2019] losartan (COZAAR) tablet 50 mg  50 mg Oral Daily Florencia Reasons, MD      . mometasone-formoterol [facility]) 200-5 MCG/ACT inhaler 2 puff  2 puff Inhalation BID Rise Patience, MD   2 puff at 10/24/19 0753  . ondansetron (ZOFRAN) tablet 4 mg  4 mg Oral Q6H PRN Rise Patience, MD       Or  . ondansetron [facility]) injection 4 mg  4 mg Intravenous Q6H PRN Rise Patience, MD      . pantoprazole (PROTONIX) EC tablet 40 mg  40 mg Oral Daily Rise Patience, MD   40 mg at 10/24/19 1049  . phenol (CHLORASEPTIC) mouth spray 1 spray  1 spray Mouth/Throat PRN Nita Sells, MD   1 spray at 10/19/19 1703  . potassium & sodium phosphates (PHOS-NAK) 280-160-250 MG packet 1 packet  1 packet Oral TID WC & HS Florencia Reasons, MD   1 packet at 10/24/19 [phone]  . potassium chloride (KLOR-CON) packet 40 mEq  40 mEq Oral Daily Florencia Reasons, MD   40 mEq at 10/24/19 1049  . predniSONE (DELTASONE) tablet 40 mg  40 mg Oral QAC breakfast Nita Sells, MD   40 mg at 10/24/19 0803  . risperiDONE (RISPERDAL M-TABS) disintegrating tablet 1 mg  1 mg Oral QHS Nita Sells, MD   1 mg at 10/23/19 2008  . umeclidinium bromide (INCRUSE ELLIPTA) 62.5 MCG/INH 1 puff  1 puff Inhalation Daily Margaretha Seeds, MD   1 puff at 10/24/19 0753    Allergies as of 10/05/2019 - Review Complete 10/04/2019  Allergen Reaction Noted  . Celexa [citalopram] Other (See Comments) 04/19/2019    Family History  Problem Relation Age of Onset  . Hypertension Mother   . Heart  attack Mother   . Heart attack Father   . Brain cancer Brother   . Colon cancer Maternal Aunt   . Stomach cancer Neg Hx   . Esophageal cancer Neg Hx   . Pancreatic  cancer Neg Hx     Social History   Socioeconomic History  . Marital status: Single    Spouse name: Not on file  . Number of children: Not on file  . Years of education: Not on file  . Highest education level: Not on file  Occupational History  . Occupation: retired  Tobacco Use  . Smoking status: Current Every Day Smoker    Packs/day: 1.50    Years: 48.00    Pack years: 72.00    Types: Cigarettes  . Smokeless tobacco: Never Used  . Tobacco comment: 10/25/18 1 to 1 1/2 ppd  Substance and Sexual Activity  . Alcohol use: Yes    Comment: occ  . Drug use: Yes    Types: Cocaine, Benzodiazepines  . Sexual activity: Not on file  Other Topics Concern  . Not on file  Social History Narrative  . Not on file   Social Determinants of Health   Financial Resource Strain:   . Difficulty of Paying Living Expenses: Not on file  Food Insecurity:   . Worried About Charity fundraiser in the Last Year: Not on file  . Ran Out of Food in the Last Year: Not on file  Transportation Needs:   . Lack of Transportation (Medical): Not on file  . Lack of Transportation (Non-Medical): Not on file  Physical Activity:   . Days of Exercise per Week: Not on file  . Minutes of Exercise per Session: Not on file  Stress:   . Feeling of Stress : Not on file  Social Connections:   . Frequency of Communication with Friends and Family: Not on file  . Frequency of Social Gatherings with Friends and Family: Not on file  . Attends Religious Services: Not on file  . Active Member of Clubs or Organizations: Not on file  . Attends Archivist Meetings: Not on file  . Marital Status: Not on file  Intimate Partner Violence:   . Fear of Current or Ex-Partner: Not on file  . Emotionally Abused: Not on file  . Physically Abused: Not on  file  . Sexually Abused: Not on file    Review of Systems: ROS is O/W negative except as mentioned in HPI.  Physical Exam: Vital signs in last 24 hours: Temp:  [97.5 F (36.4 C)-97.8 F (36.6 C)] 97.8 F (36.6 C) (03/05 1348) Pulse Rate:  [77-96] 82 (03/05 1348) Resp:  [18-22] 20 (03/05 1348) BP: (158-199)/(80-98) 166/98 (03/05 1348) SpO2:  [91 %-98 %] 92 % (03/05 1348) Last BM Date: 10/22/19 General:  Alert, chronically ill-appearing, pleasant and cooperative in NAD Head:  Normocephalic and atraumatic. Eyes:  Sclera clear, no icterus.  Conjunctiva pink. Ears:  Normal auditory acuity. Mouth:  Thrush noted on palate, tongue, and buccal mucosa.   Lungs:  Some coarse lung sounds noted B/L anteriorly. Heart:  Regular rate and rhythm; no murmurs, clicks, rubs, or gallops. Abdomen:  Soft, non-distended.  BS present.  Mild diffuse TTP.  Msk:  Symmetrical without gross deformities. Pulses:  Normal pulses noted. Extremities:  Without clubbing or edema. Neurologic:  Alert and oriented x 4;  grossly normal neurologically. Skin:  Intact without significant lesions or rashes. Psych:  Alert and cooperative. Normal mood and affect.  Intake/Output from previous day: 03/04 0701 - 03/05 0700 In: 325 [P.O.:325] Out: 1300 [Urine:1300]  Lab Results: Recent Labs    10/23/19 1116 10/24/19 0811  WBC 17.1* 15.8*  HGB 13.3 12.5*  HCT 38.9* 36.7*  PLT 208 189   BMET Recent Labs    10/26/2019 1603 10/23/19 1116 10/24/19 0811  NA 141 143 141  K 3.4* 3.5 3.0*  CL 106 107 102  CO2 25 22 25   GLUCOSE 156* 188* 165*  BUN 12 11 13   CREATININE 0.63 0.62 0.73  CALCIUM 7.9* 8.3* 8.2*   LFT Recent Labs    10/22/19 0959 10/23/19 1116 10/24/19 0811  PROT 5.8*   < > 5.6*  ALBUMIN 3.2*   < > 3.0*  AST 131*   < > 207*  ALT 326*   < > 500*  ALKPHOS 230*   < > 363*  BILITOT 0.9   < > 1.0  BILIDIR 0.3*  --   --   IBILI 0.6  --   --    < > = values in this interval not displayed.    Hepatitis Panel Recent Labs    11/15/2019 1610  HEPBSAG NON REACTIVE  HCVAB NON REACTIVE  HEPAIGM NON REACTIVE  HEPBIGM NON REACTIVE   Studies/Results: CT ABDOMEN PELVIS W CONTRAST  Result Date: 10/22/2019 CLINICAL DATA:  64 year old male with history of adrenal mass suspicious for malignancy. Follow-up study. EXAM: CT ABDOMEN AND PELVIS WITH CONTRAST TECHNIQUE: Multidetector CT imaging of the abdomen and pelvis was performed using the standard protocol following bolus administration of intravenous contrast. CONTRAST:  183mL OMNIPAQUE IOHEXOL 300 MG/ML  SOLN COMPARISON:  No priors. FINDINGS: Lower chest: Atherosclerotic calcifications in the left circumflex and right coronary arteries. Hepatobiliary: Multiple ill-defined hypovascular lesions are scattered throughout the hepatic parenchyma, highly concerning for widespread metastatic disease. One specific example is the lesion at the junction of segment 8 and the caudate lobe (axial image 19 of series 3) measuring 3.2 x 2.1 cm. No intra or extrahepatic biliary ductal dilatation. Gallbladder is nearly completely collapsed with avid enhancement of the gallbladder mucosa and diffuse wall edema, without surrounding inflammatory changes. Pancreas: No pancreatic mass. No pancreatic ductal dilatation. No pancreatic or peripancreatic fluid collections or inflammatory changes. Spleen: Unremarkable. Adrenals/Urinary Tract: Subcentimeter low-attenuation lesions in both kidneys, too small to characterize, but statistically likely to represent cysts. No hydroureteronephrosis. Urinary bladder is normal in appearance. Right adrenal gland is normal. 1.2 cm left adrenal nodule (axial image 23 of series 3), similar to the prior chest CT 10/06/2019. Stomach/Bowel: Normal appearance of the stomach. No pathologic dilatation of small bowel or colon. Numerous colonic diverticulae are noted, without surrounding inflammatory changes to suggest an acute diverticulitis at this  time. Normal appendix. Vascular/Lymphatic: Aortic atherosclerosis, without evidence of aneurysm or dissection in the abdominal or pelvic vasculature. No lymphadenopathy noted in the abdomen or pelvis. Reproductive: Prostate gland and seminal vesicles are unremarkable in appearance. Other: No significant volume of ascites.  No pneumoperitoneum. Musculoskeletal: There are no aggressive appearing lytic or blastic lesions noted in the visualized portions of the skeleton. IMPRESSION: 1. Small left adrenal nodule is nonspecific. This could either be a benign or malignant lesion. 2. Importantly, there are innumerable hypovascular areas in the liver which are highly suspicious for metastatic disease. This could be better evaluated with follow-up abdominal MRI with and without IV gadolinium. 3. Colonic diverticulosis without evidence of acute diverticulitis at this time. 4. Additional incidental findings, as above. Electronically Signed   By: Vinnie Langton M.D.   On: 10/22/2019 16:40    IMPRESSION:  *Abnormal esophagus on CT scan showing circumferential mural wall thickening of the distal thoracic esophagus.  Has history of Candida esophagitis and has oral thrush on  exam today.  Question some pill induced esophagitis as well as Fosamax, Mobic, potassium chloride, prednisone could all pose an issue if retained in the esophagus. *Elevated LFT's with AST 207, ALT 500, and ALP 363, total bili normal.  Multiple ill-defined hypovascular lesions are scattered throughout the hepatic parenchyma, highly concerning for widespread metastatic disease.  No sign of biliary obstruction by imaging.  Acute hepatitis panel is negative.  This may very well be secondary to tumor burden. *Newly diagnosed small cell lung cancer, metastatic.  First radiation treatment today. *Hypokalemia:  K+ 3.0 today. *Leukocytosis:  Is on chronic prednisone. *History of stroke on Plavix  PLAN: *We are going to treat him empirically for now and  plan to hold off on endoscopy.  I have increased pantoprazole to BID.  I am starting Diflucan 200 mg IV daily for now.  Can be transitioned to p.o. upon discharge.  Starting nystatin swish and swallow 5 mL 4 times daily.  Also prescribing viscous lidocaine to be taken 3 times daily before meals. *Needs to be sure that he is taking all pills with large amount of liquids and remaining upright for some time following ingestion.  Laban Emperor. Zehr  10/24/2019, 3:15 PM    Attending Physician Note   I have taken a history, examined the patient and reviewed the chart. I agree with the Advanced Practitioner's note, impression and recommendations.  * History, exam, CT findings all c/w candida esophagitis. Possible pill induced, possible reflux esophagitis. Other etiologies are unlikely.  * Newly diagnosed small cell lung cancer with suspected hepatic mets.   Treat with Nystatin swish and swallow qid for at least 2 weeks Viscous lidocaine tid ac prn Pharmacist relates interaction between Diflucan and Plavix so no Diflucan Pantoprazole 40 mg po bid Soft diet and can change to full liquids as needed Potassium pills and Fosamax are common esophageal irritants, try to avoid these Take all medications in fully upright position with adequate fluids Please contact us if he is not improving by Monday, Tuesday  Lucio Edward, MD [facility] Gastroenterology

## 2019-10-24 NOTE — Progress Notes (Signed)
Chaplain attempted to contact re: advance directive consult.  Pt meeting with PA and then Palliative team.  Unable to complete request at this time.  Will follow up to assist patient in completing document.  If pt has completed document that needs notary over weekend, please call Nurse [facility].

## 2019-10-24 NOTE — Progress Notes (Signed)
  Radiation Oncology         [phone]) [phone] ________________________________  Name: Drew Nguyen MRN: [id number]  Date: 10/24/2019  DOB: [birth year]  SIMULATION AND TREATMENT PLANNING NOTE    ICD-10-CM   1. Small cell lung cancer, left (HCC)  C34.92     DIAGNOSIS:  64 yo man with small cell carcinoma of the left upper lung  NARRATIVE:  The patient was brought to the [facility].  Identity was confirmed.  All relevant records and images related to the planned course of therapy were reviewed.  The patient freely provided informed written consent to proceed with treatment after reviewing the details related to the planned course of therapy. The consent form was witnessed and verified by the simulation staff.  Then, the patient was set-up in a stable reproducible  supine position for radiation therapy.  CT images were obtained.  Surface markings were placed.  The CT images were loaded into the planning software.  Then the target and avoidance structures were contoured.  Treatment planning then occurred.  The radiation prescription was entered and confirmed.  Then, I designed and supervised the construction of a total of 6 medically necessary complex treatment devices, including a BodyFix immobilization mold custom fitted to the patient along with 5 multileaf collimators conformally shaped radiation around the treatment target while shielding critical structures such as the heart and spinal cord maximally.  I have requested : 3D Simulation  I have requested a DVH of the following structures: Left lung, right lung, spinal cord, heart, esophagus, and target.  I have ordered:Nutrition Consult  SPECIAL TREATMENT PROCEDURE:  The planned course of therapy using radiation constitutes a special treatment procedure. Special care is required in the management of this patient for the following reasons.  The patient will be receiving concurrent chemotherapy requiring careful monitoring for increased  toxicities of treatment including periodic laboratory values.  The special nature of the planned course of radiotherapy will require increased physician supervision and oversight to ensure patient's safety with optimal treatment outcomes.  PLAN:  The patient will receive 59.4 Gy in 33 fractions if staging work-up fails to confirm metastatic disease.  ________________________________  Sheral Apley. Tammi Klippel, M.D.

## 2019-10-25 ENCOUNTER — Inpatient Hospital Stay (HOSPITAL_COMMUNITY): Payer: Medicaid Other

## 2019-10-25 LAB — CBC WITH DIFFERENTIAL/PLATELET
Abs Immature Granulocytes: 0.41 10*3/uL — ABNORMAL HIGH (ref 0.00–0.07)
Basophils Absolute: 0.1 10*3/uL (ref 0.0–0.1)
Basophils Relative: 1 %
Eosinophils Absolute: 0.1 10*3/uL (ref 0.0–0.5)
Eosinophils Relative: 0 %
HCT: 37.6 % — ABNORMAL LOW (ref 39.0–52.0)
Hemoglobin: 12.5 g/dL — ABNORMAL LOW (ref 13.0–17.0)
Immature Granulocytes: 3 %
Lymphocytes Relative: 7 %
Lymphs Abs: 0.9 10*3/uL (ref 0.7–4.0)
MCH: 31.8 pg (ref 26.0–34.0)
MCHC: 33.2 g/dL (ref 30.0–36.0)
MCV: 95.7 fL (ref 80.0–100.0)
Monocytes Absolute: 0.7 10*3/uL (ref 0.1–1.0)
Monocytes Relative: 5 %
Neutro Abs: 12 10*3/uL — ABNORMAL HIGH (ref 1.7–7.7)
Neutrophils Relative %: 84 %
Platelets: 166 10*3/uL (ref 150–400)
RBC: 3.93 MIL/uL — ABNORMAL LOW (ref 4.22–5.81)
RDW: 13 % (ref 11.5–15.5)
WBC: 14.2 10*3/uL — ABNORMAL HIGH (ref 4.0–10.5)
nRBC: 0.2 % (ref 0.0–0.2)

## 2019-10-25 LAB — MAGNESIUM: Magnesium: 1.8 mg/dL (ref 1.7–2.4)

## 2019-10-25 LAB — COMPREHENSIVE METABOLIC PANEL
ALT: 466 U/L — ABNORMAL HIGH (ref 0–44)
AST: 167 U/L — ABNORMAL HIGH (ref 15–41)
Albumin: 3.2 g/dL — ABNORMAL LOW (ref 3.5–5.0)
Alkaline Phosphatase: 361 U/L — ABNORMAL HIGH (ref 38–126)
Anion gap: 10 (ref 5–15)
BUN: 17 mg/dL (ref 8–23)
CO2: 27 mmol/L (ref 22–32)
Calcium: 8.2 mg/dL — ABNORMAL LOW (ref 8.9–10.3)
Chloride: 103 mmol/L (ref 98–111)
Creatinine, Ser: 0.52 mg/dL — ABNORMAL LOW (ref 0.61–1.24)
GFR calc Af Amer: >60 mL/min (ref >60)
GFR calc non Af Amer: >60 mL/min (ref >60)
Glucose, Bld: 153 mg/dL — ABNORMAL HIGH (ref 70–99)
Potassium: 2.4 mmol/L — CL (ref 3.5–5.1)
Sodium: 140 mmol/L (ref 135–145)
Total Bilirubin: 1 mg/dL (ref 0.3–1.2)
Total Protein: 6 g/dL — ABNORMAL LOW (ref 6.5–8.1)

## 2019-10-25 LAB — PHOSPHORUS: Phosphorus: 2.7 mg/dL (ref 2.5–4.6)

## 2019-10-25 IMAGING — DX DG CHEST 2V
3 series · 3 of 3 positions shown · non-contrast
Comparison: Radiograph CT [DATE]. Chest CT [DATE]

CLINICAL DATA: Cough. Right-sided chest pain. Shortness of breath.

EXAM:
CHEST - 2 VIEW

[chest lat (1 of 2)]
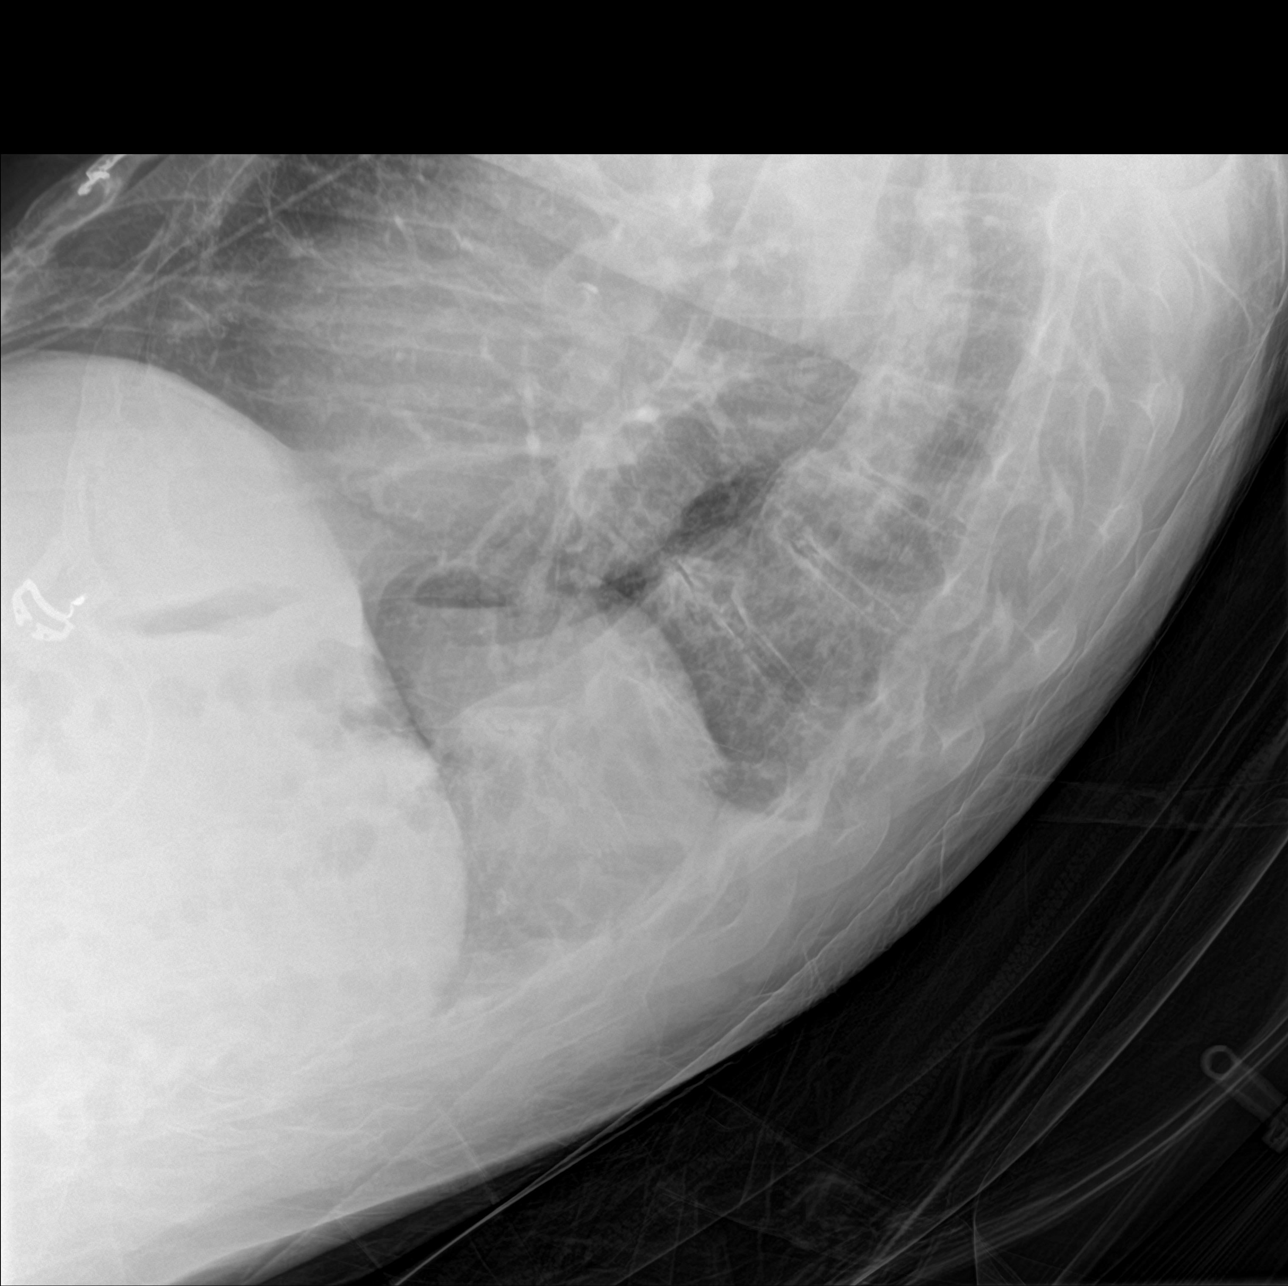

[chest ap]
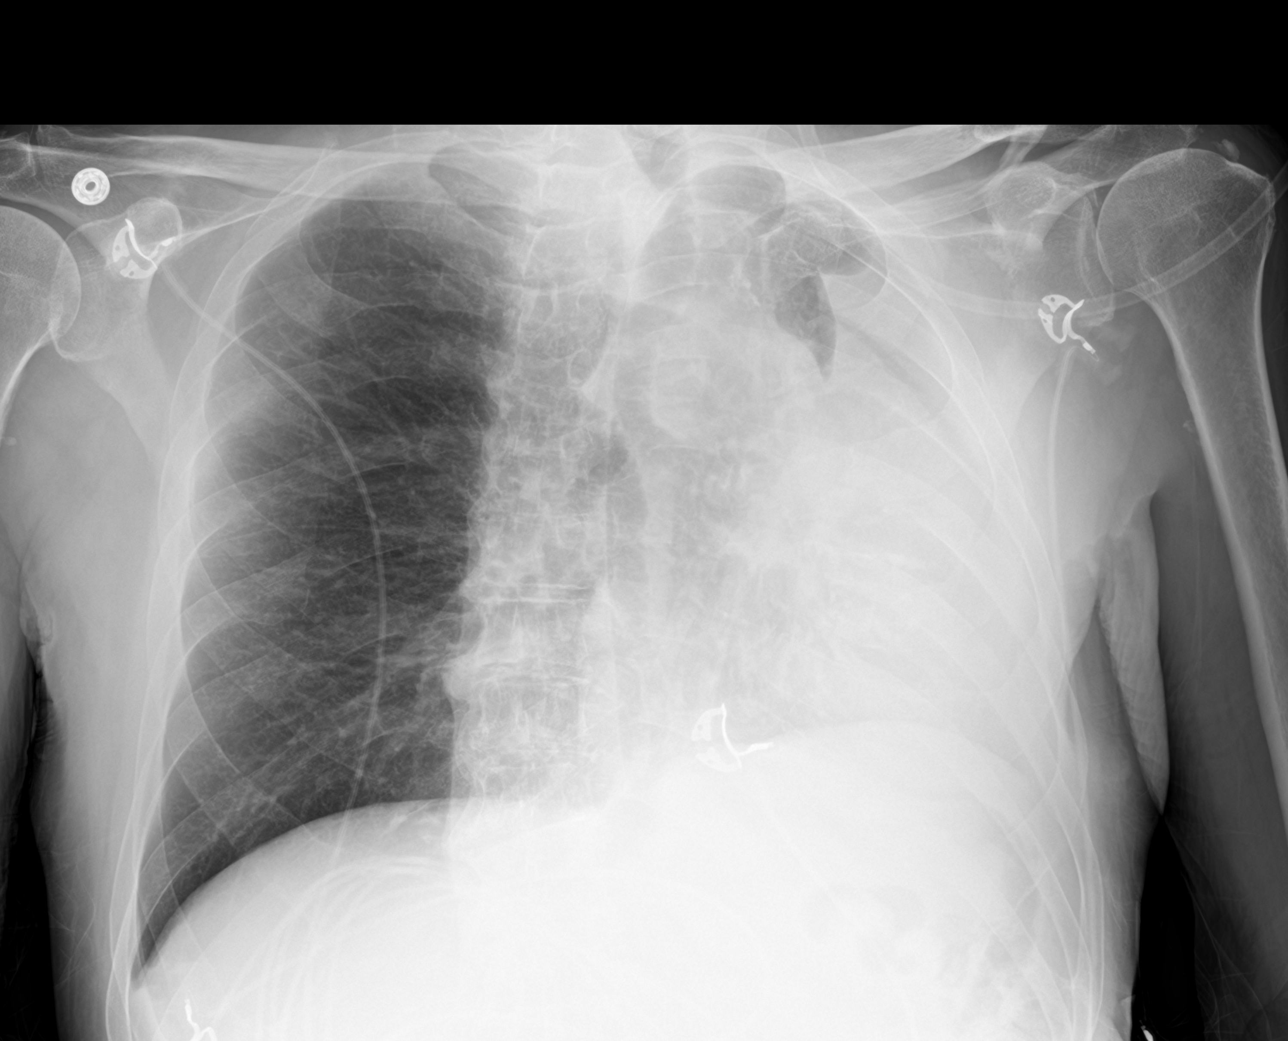

[chest lat (2 of 2)]
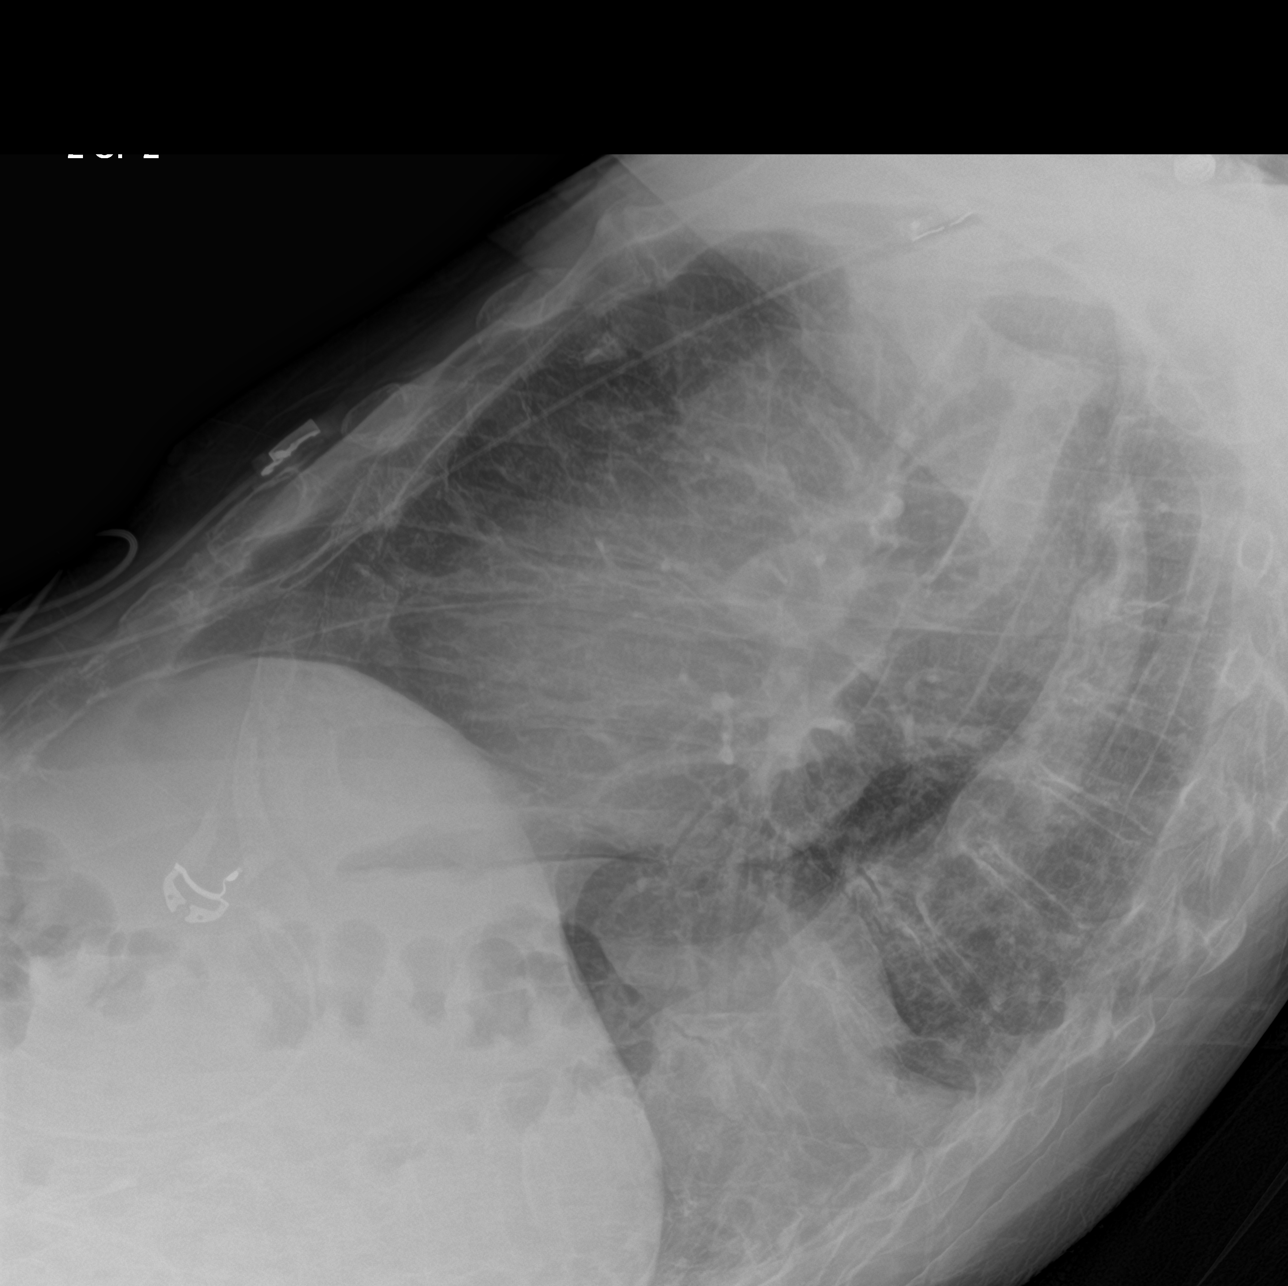

[3 of 3 positions shown; findings below may reference images not displayed]

FINDINGS: Progressive volume loss in the left hemithorax with progressive
opacification throughout, patient with known left hilar mass. This
is likely increasing postobstructive atelectasis throughout the left
lung. Difficult to exclude an element of pleural effusion. Right
lung is slightly hyperinflated but clear. Cardiomediastinal contours
are obscured by left lung opacity. No evidence of right pulmonary
edema. No visualized pneumothorax.
IMPRESSION: Progressive volume loss in the left hemithorax with increased
opacification, likely increasing postobstructive atelectasis
throughout the left lung given history of left hilar mass and upper
lobe collapse on recent CT. Difficult to exclude an element of
pleural fluid on the left.

## 2019-10-25 IMAGING — CT CT HEAD W/O CM
3 series · 15 of 47 positions shown, 18 images · non-contrast
Comparison: Brain MRI [DATE], head CT [DATE].

CLINICAL DATA: Slurred speech, history of CVAs, neuro deficit,
acute, stroke suspected.

EXAM:
CT HEAD WITHOUT CONTRAST
TECHNIQUE: Contiguous axial images were obtained from the base of the skull
through the vertex without intravenous contrast.

[Series 2: head wo · axial · 0.47mm/px · z∈[-161,-26]mm · 9 of 33 slices shown, 12 images]
[im 3/33  brain]
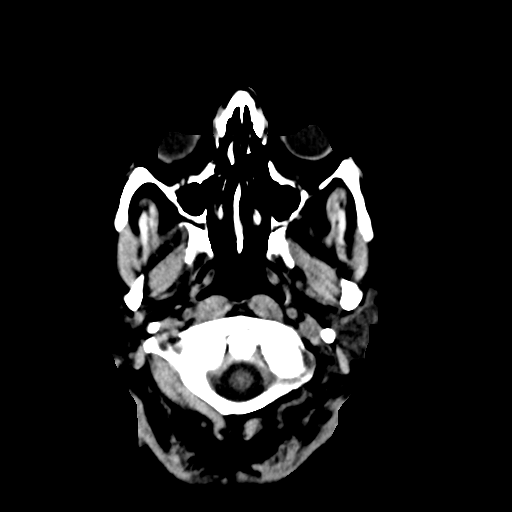
[im 3/33  bone]
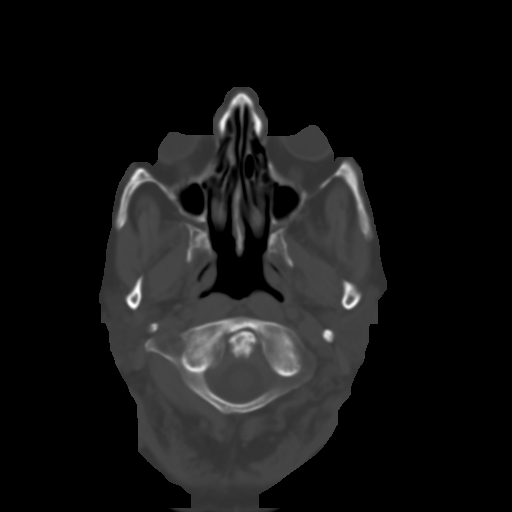
[im 6/33  brain]
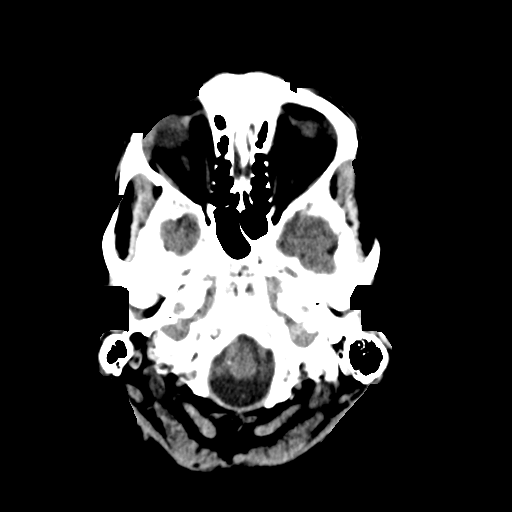
[im 9/33  brain]
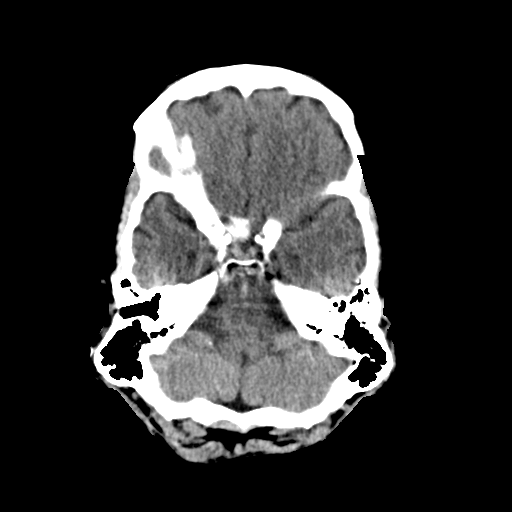
[im 13/33  brain]
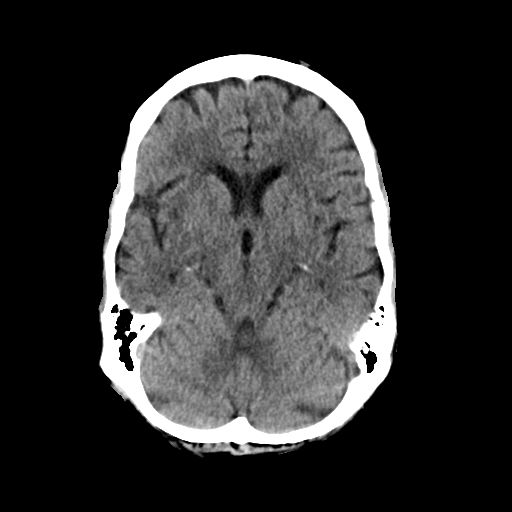
[im 17/33  brain]
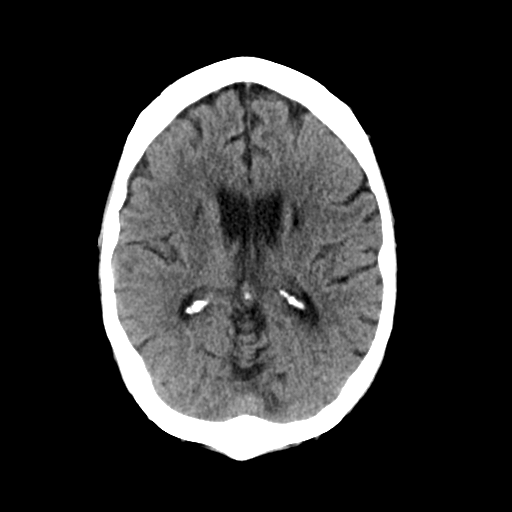
[im 17/33  bone]
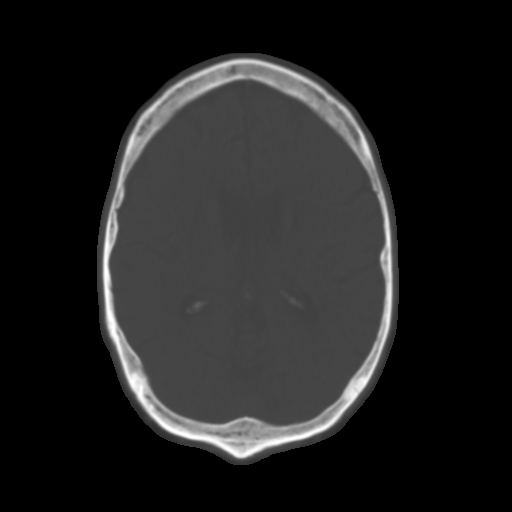
[im 20/33  brain]
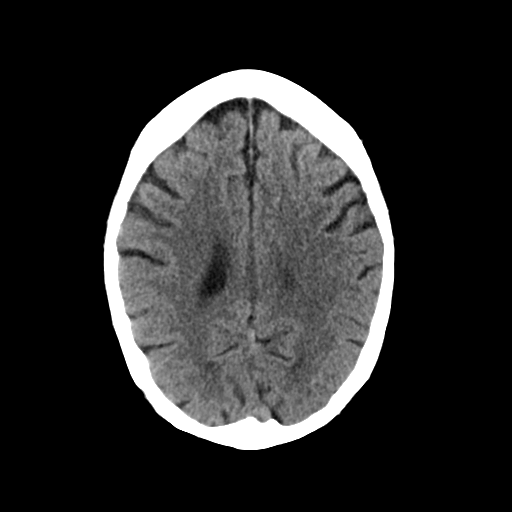
[im 24/33  brain]
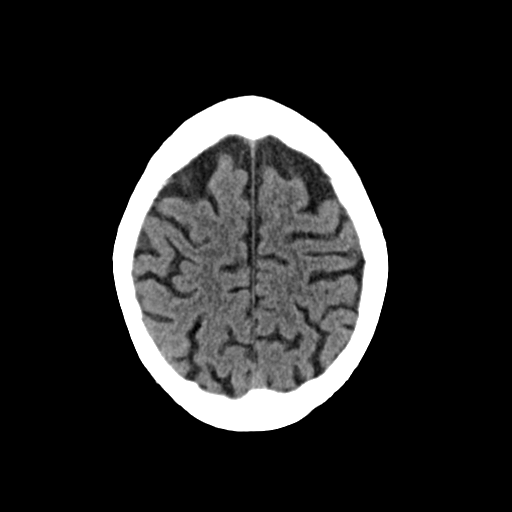
[im 27/33  brain]
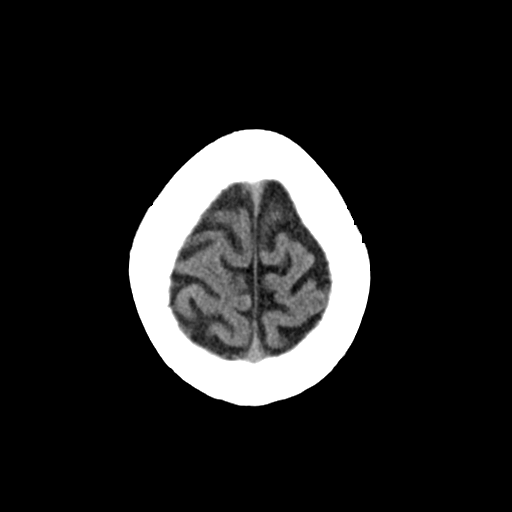
[im 30/33  brain]
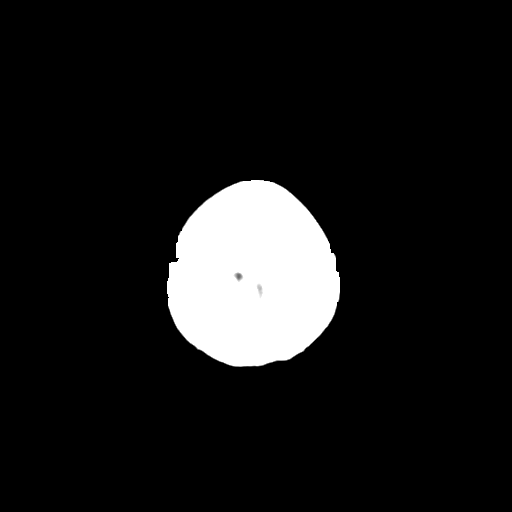
[im 30/33  bone]
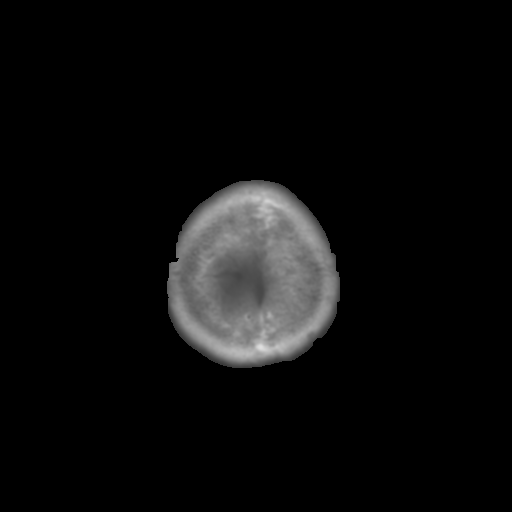

[Series 4: coronal soft tissue · coronal · 0.34mm/px · 3 of 66 slices shown]
[im 22/66  brain]
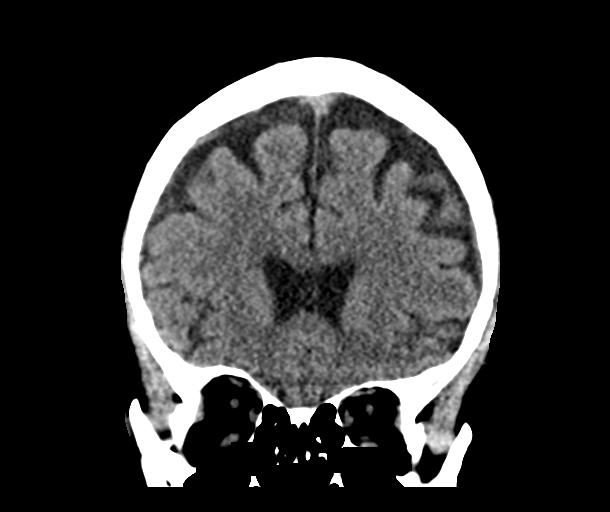
[im 29/66  brain]
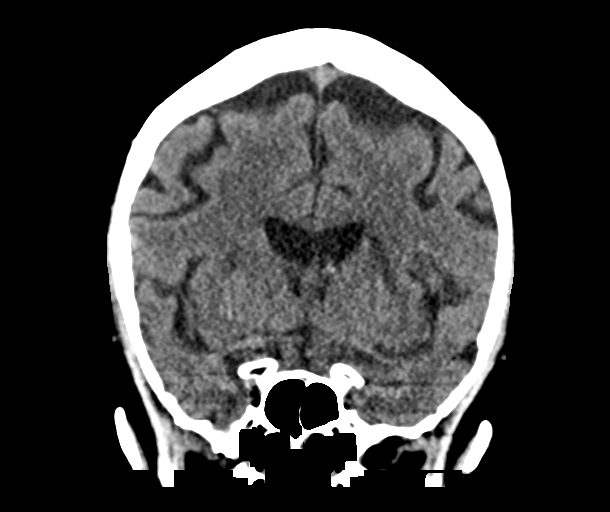
[im 37/66  brain]
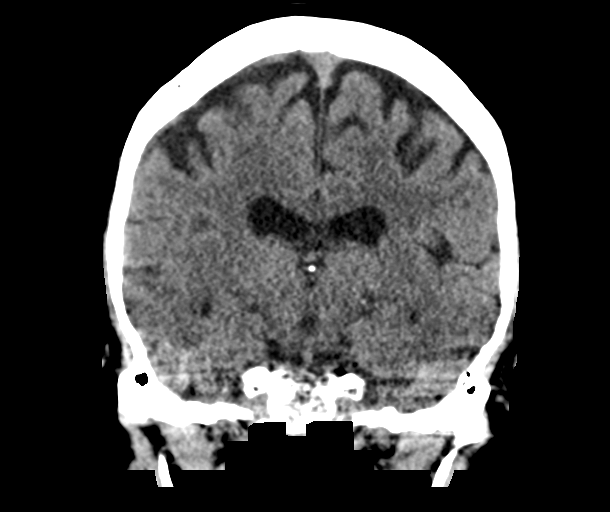

[Series 5: sagittal soft tissue · sagittal · 0.37mm/px · 3 of 49 slices shown]
[im 17/49  brain]
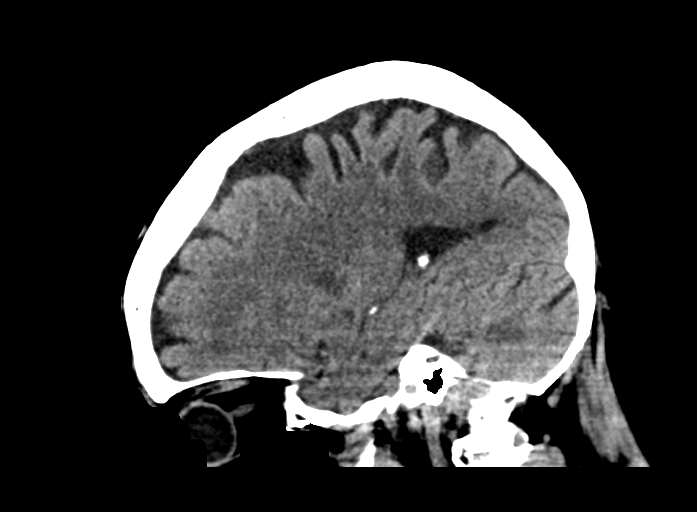
[im 25/49  brain]
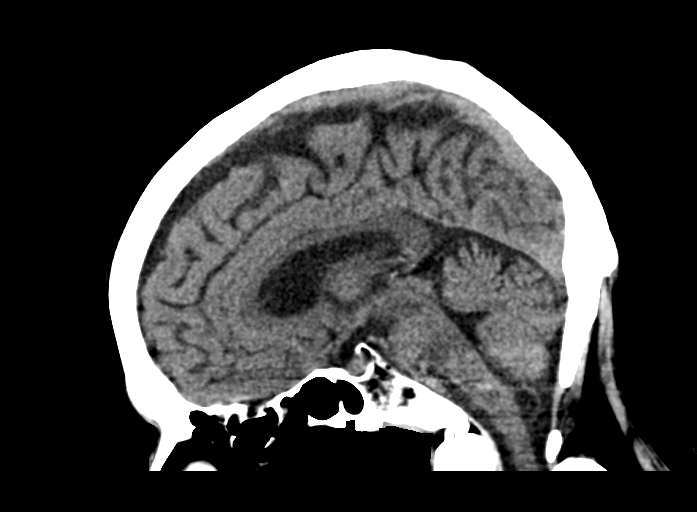
[im 33/49  brain]
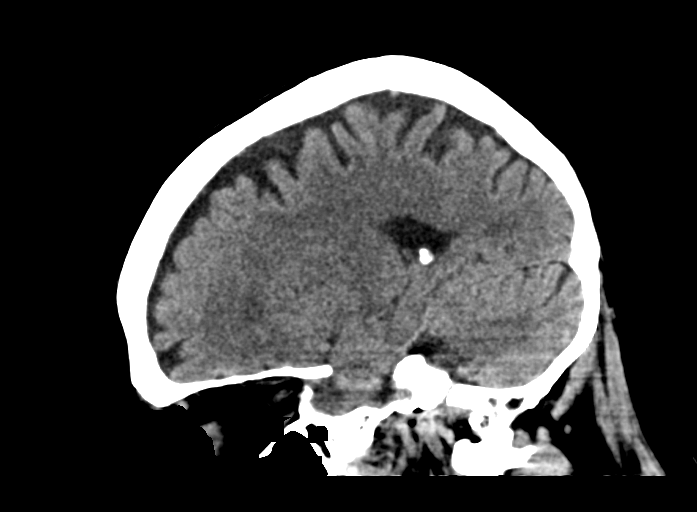

[15 of 47 positions shown; findings below may reference images not displayed]

FINDINGS: Brain: There is no evidence of acute intracranial hemorrhage,
intracranial mass, midline shift or extra-axial fluid collection.No
demarcated cortical infarction. Redemonstrated chronic lacunar
infarcts within the bilateral corona radiata/basal ganglia.
Background ill-defined hypoattenuation within the cerebral white
matter is nonspecific, but consistent with chronic small vessel
ischemic disease. Stable, mild generalized parenchymal atrophy.

Vascular: No hyperdense vessel.  Atherosclerotic calcifications.

Skull: Normal. Negative for fracture or focal lesion.

Sinuses/Orbits: Visualized orbits demonstrate no acute abnormality.
No significant paranasal sinus disease or mastoid effusion at the
imaged levels.
IMPRESSION: No evidence of acute intracranial abnormality.

Redemonstrated chronic bilateral corona radiata/basal ganglia
lacunar infarcts.

Stable background generalized parenchymal atrophy and chronic small
vessel ischemic disease.

## 2019-10-25 MED ORDER — MORPHINE SULFATE (CONCENTRATE) 10 MG/0.5ML PO SOLN
5.0000 mg | ORAL | Status: DC | PRN
  Administered 2019-10-25 – 2019-11-01 (×6): 5 mg via SUBLINGUAL
  Filled 2019-10-25 (×7): qty 0.5

## 2019-10-25 MED ORDER — CARVEDILOL 12.5 MG PO TABS
12.5000 mg | ORAL_TABLET | Freq: Two times a day (BID) | ORAL | Status: DC
  Administered 2019-10-25 – 2019-11-01 (×12): 12.5 mg via ORAL
  Filled 2019-10-25 (×14): qty 1

## 2019-10-25 MED ORDER — SENNOSIDES-DOCUSATE SODIUM 8.6-50 MG PO TABS
1.0000 | ORAL_TABLET | Freq: Every day | ORAL | Status: DC
  Administered 2019-10-26 – 2019-11-03 (×4): 1 via ORAL
  Filled 2019-10-25 (×5): qty 1

## 2019-10-25 MED ORDER — ENSURE ENLIVE PO LIQD
237.0000 mL | Freq: Two times a day (BID) | ORAL | Status: DC
  Administered 2019-10-26 – 2019-10-31 (×4): 237 mL via ORAL

## 2019-10-25 MED ORDER — LIDOCAINE VISCOUS HCL 2 % MT SOLN: 15.0000 mL | Freq: Three times a day (TID) | OROMUCOSAL | Status: DC

## 2019-10-25 MED ORDER — LACTULOSE 10 GM/15ML PO SOLN
30.0000 g | Freq: Two times a day (BID) | ORAL | Status: DC
  Administered 2019-10-26 – 2019-10-31 (×7): 30 g via ORAL
  Filled 2019-10-25 (×10): qty 45

## 2019-10-25 MED ORDER — LORAZEPAM 0.5 MG PO TABS
0.5000 mg | ORAL_TABLET | Freq: Four times a day (QID) | ORAL | Status: DC | PRN
  Administered 2019-10-25: 22:00:00 0.5 mg via ORAL
  Filled 2019-10-25: qty 1

## 2019-10-25 MED ORDER — HEPARIN SODIUM (PORCINE) 5000 UNIT/ML IJ SOLN
5000.0000 [IU] | Freq: Three times a day (TID) | INTRAMUSCULAR | Status: DC
  Filled 2019-10-25: qty 1

## 2019-10-25 MED ORDER — TRAZODONE HCL 50 MG PO TABS
50.0000 mg | ORAL_TABLET | Freq: Every day | ORAL | Status: DC
  Administered 2019-10-26 – 2019-10-31 (×5): 50 mg via ORAL
  Filled 2019-10-25 (×5): qty 1

## 2019-10-25 MED ORDER — POTASSIUM CHLORIDE 20 MEQ PO PACK
40.0000 meq | PACK | Freq: Three times a day (TID) | ORAL | Status: DC
  Administered 2019-10-25 – 2019-10-26 (×4): 40 meq via ORAL
  Filled 2019-10-25 (×4): qty 2

## 2019-10-25 NOTE — Progress Notes (Signed)
EKG done and oxygen placed on pt due to c/o of chest hurting and needing oxygen. MD paged.

## 2019-10-25 NOTE — Progress Notes (Signed)
PROGRESS NOTE  Drew Nguyen  PJA:[id number] DOB: September 07, 1955 DOA: 10/18/2019 PCP: Boyce Medici, FNP   Brief Narrative: Drew Nguyen is a 64 y.o. 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 MRI brain showed remote lacunar infarcts without acute abnormality. Later EEG was reportedly normal. CXR was abnormal and subsequent CT chest demonstrated a hilar mass with associated adenopathy and compression of left upper lobe bronchus and pulmonary artery with distal atelectasis. This also showed distal esophageal thickening and nonspecific left adrenal thickening. 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 CT abdomen/pelvis performed to follow up left adrenal findings revealed a nonspecific nodule with innumerable hypovascular areas in the liver confirmed on subsequent MRI, which also showed diffuse enhancing T2 hyperintense bone lesions throughout the visualized portion of the thoracolumbar spine and pelvis, consistent with diffuse bone metastases.  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 - Appreciate oncology consultation, will require improvement in functional status prior to systemic therapy.  - Radonc recommended transfer to [facility] to undergo inpatient  radiation therapy. - Palliative care team also consulted.   Hepatic metastatic disease with hepatic encephalopathy: With LFT elevation, normal bilirubin. INR 1.0 indicating preserved synthetic function. Hepatitis panel negative.  - Continue lactulose, seems to have improved mentation, though not at goal stools, increase dose as below.   LUL, postobstructive CAP: Completed doxycycline 2/26 - 3/2.   Hyponatremia: POA, possibly beer potomania, currently resolved.   Generalized tonic-clonic seizures/acute toxic metabolic encephalopathy (presenting symptom): No brain metastases, confirmed remote lacunar infarcts in bilateral basal ganglia and corona radiata, atrophy, EEG was within normal limits.  - Neurology has signed off   Hypokalemia: Severe.  - Supplement aggressively, give empiric magnesium due to recent hypomagnesemia.   History of recurrent CVA:  - Continue plavix (held around time of biopsy), statin not indicated due to elevated LFTs and LDL of 53  Slurred speech: Worsening this PM. Multifactorial most likely due to encephalopathy/possibly paraneoplastic, dry mouth causing dysarthria. Subtle right-sided lower facial weakness is only focal deficit on exam.  - Check CT head to r/o CVA - Supportive care otherwise. No stools today, aiming for 3 or so loose stools with lactulose, will increase dosing. If continues and/or waxes/wanes, consider changing risperdal and other psychoactive medications.  Hypophosphatemia:  - Monitor in AM and supplement regularly  Hypertension with hypertensiveurgency: Shile no longer severe, will need improved control.  - Continue norvasc 10mg  - Increased losartan which may help hypokalemia.  - Started coreg, had short run of PSVT this PM, will increase PM dose.  - Continue prn hydralazine.   Right-sided chest pain: ECG unchanged without ischemic features, not exertional/typical. Note patient requests oxygen but is not hypoxemic. - Check CXR r/o PTX,  etc.   Rheumatoid arthritis on chronic immunosuppression:  - Tapering down from stress-dose steroids, can taper further now. Hypertensive. Baseline prednisone of late said to be 15mg .  - Holding plaquenil and enbrel  Polysubstance abuse, alcohol abuse, tobacco use:  - Cessation counseling provided.  - Monitor for evidence of withdrawal, though now outside window for this.  COPD: No exacerbation.  - Continue bronchodilators.   Esophageal thickening:  - Appreciate GI consultation, giving PPI BID, nystatin for possibility of thrush, and viscous lidocaine.   Depression: Continue Celexa and Risperdal  FTT, muscular deconditioning:  - Continue PT/OT and SNF recommendations.   - Supplement protein with hypoalbuminemia. Ensure.   Bleeding at lovenox injection site: Minimal total blood loss.  - Change lovenox to heparin (next dose in 24 hrs). Remains at high risk of thrombosis. Need to continue plavix with such high stroke risk. - Monitor site, keep pressure bandage, trend CBC.  DVT prophylaxis: Heparin Code Status: Full Family Communication: None at bedside Disposition Plan: Patient from home, continues to have multiple severe medical problems undergoing active treatment. SNF recommended, though patient reluctant.   Consultants:   Oncology  Neurology  PCCM  Radiation oncology  Palliative care  GI  Procedures:   EEG Flexible video fiberoptic bronchoscopy with endobronchial ultrasound and biopsies on March 2 by Dr. Shearon Stalls  Antimicrobials:  Doxycycline 2/26 - 3/2   Subjective: Feels mentally foggy but improving overall, no pain this morning, has not had a stool x24 hours. Later in the afternoon reported gradual onset of right lateral chest pain described as "pain in my lung." This has no relationship to breathing, arm movement/position, or exertion. Slightly improved with morphine administration. He feels oxygen is helping the pain but denies true shortness of breath at  this time.   Objective: Vitals:   10/25/19 0405 10/25/19 0800 10/25/19 1000 10/25/19 1425  BP: (!) 181/90  (!) 171/83 (!) 163/81  Pulse: 75  81 74  Resp: 19   20  Temp: 97.7 F (36.5 C)   97.7 F (36.5 C)  TempSrc: Oral   Oral  SpO2: 92% 96% 95% 94%  Weight:      Height:        Intake/Output Summary (Last 24 hours) at 10/25/2019 1552 Last data filed at 10/25/2019 1222 Gross per 24 hour  Intake --  Output 2525 ml  Net -2525 ml   Filed Weights   09/23/2019 2057 11/10/2019 1126  Weight: 70 kg 70 kg    Gen: Acutely and chronically ill-appearing male in no distress Pulm: Non-labored breathing. Clear to auscultation bilaterally.  CV: Regular rate and rhythm. No murmur, rub, or gallop. No JVD, 1+ pedal edema. GI: Abdomen soft, minimally tender without rebound or guarding, non-distended, with normoactive bowel sounds. No organomegaly or masses felt. Ext: Warm, no deformities Skin: Diffuse ecchymoses across UE's. Abdominal wall has ~1in annular ecchymosis with central slow oozing bleed. Neuro: Alert and oriented. No asterixis. Cranial nerves tested, subtle right sided facial weakness. Peripheral sensorimotor exam is wnl.  Psych: Judgement and insight appear normal. Mood & affect appropriate.   Data Reviewed: I have personally reviewed following labs and imaging studies  CBC: Recent Labs  Lab 10/19/19 0445 10/20/19 0117 10/23/19 1116 10/24/19 0811 10/25/19 0545  WBC 14.8* 17.0* 17.1* 15.8* 14.2*  NEUTROABS 13.0* 15.3* 15.0* 13.2* 12.0*  HGB 12.5* 13.9 13.3 12.5* 12.5*  HCT 36.2* 40.4 38.9* 36.7* 37.6*  MCV 92.1 92.4 92.0 93.6 95.7  PLT 286 260 208 189 166  Basic Metabolic Panel: Recent Labs  Lab 10/22/2019 0126 11/07/2019 1603 10/22/19 0959 10/23/19 1116 10/24/19 0811 10/25/19 0545  NA 140 141  --  143 141 140  K 2.7* 3.4*  --  3.5 3.0* 2.4*  CL 105 106  --  107 102 103  CO2 25 25  --  22 25 27   GLUCOSE 174* 156*  --  188* 165* 153*  BUN 11 12  --  11 13 17    CREATININE 0.58* 0.63  --  0.62 0.73 0.52*  CALCIUM 7.8* 7.9*  --  8.3* 8.2* 8.2*  MG 1.6*  --  1.6* 1.5* 1.8 1.8  PHOS  --   --  1.5* 1.5* 2.5 2.7   GFR: Estimated Creatinine Clearance: 84.2 mL/min (A) (by C-G formula based on SCr of 0.52 mg/dL (L)). Liver Function Tests: Recent Labs  Lab 11/01/2019 0126 10/22/19 0959 10/23/19 1116 10/24/19 0811 10/25/19 0545  AST 174* 131* 290* 207* 167*  ALT 350* 326* 521* 500* 466*  ALKPHOS 219* 230* 319* 363* 361*  BILITOT 0.9 0.9 1.1 1.0 1.0  PROT 5.3* 5.8* 5.8* 5.6* 6.0*  ALBUMIN 3.1* 3.2* 3.3* 3.0* 3.2*   No results for input(s): LIPASE, AMYLASE in the last 168 hours. Recent Labs  Lab 11/13/2019 1603 10/23/19 1116 10/24/19 0811  AMMONIA 66* 67* 52*   Coagulation Profile: Recent Labs  Lab 10/19/19 0445  INR 1.0   Cardiac Enzymes: No results for input(s): CKTOTAL, CKMB, CKMBINDEX, TROPONINI in the last 168 hours. BNP (last 3 results) No results for input(s): PROBNP in the last 8760 hours. HbA1C: No results for input(s): HGBA1C in the last 72 hours. CBG: No results for input(s): GLUCAP in the last 168 hours. Lipid Profile: No results for input(s): CHOL, HDL, LDLCALC, TRIG, CHOLHDL, LDLDIRECT in the last 72 hours. Thyroid Function Tests: No results for input(s): TSH, T4TOTAL, FREET4, T3FREE, THYROIDAB in the last 72 hours. Anemia Panel: No results for input(s): VITAMINB12, FOLATE, FERRITIN, TIBC, IRON, RETICCTPCT in the last 72 hours. Urine analysis:    Component Value Date/Time   COLORURINE YELLOW 10/05/2019 2238   APPEARANCEUR CLEAR 10/09/2019 2238   LABSPEC 1.013 10/17/2019 2238   PHURINE 7.0 09/28/2019 2238   GLUCOSEU NEGATIVE 09/22/2019 2238   HGBUR NEGATIVE 10/11/2019 2238   [place] 10/17/2019 2238   KETONESUR NEGATIVE 10/05/2019 2238   PROTEINUR 100 (A) 10/07/2019 2238   UROBILINOGEN 2.0 (H) 05/03/2010 2059   NITRITE NEGATIVE 10/12/2019 2238   LEUKOCYTESUR NEGATIVE 10/14/2019 2238   Recent  Results (from the past 240 hour(s))  SARS CORONAVIRUS 2 (TAT 6-24 HRS) Nasopharyngeal Nasopharyngeal Swab     Status: None   Collection Time: 10/14/2019 11:59 PM   Specimen: Nasopharyngeal Swab  Result Value Ref Range Status   SARS Coronavirus 2 NEGATIVE NEGATIVE Final    Comment: (NOTE) SARS-CoV-2 target nucleic acids are NOT DETECTED. The SARS-CoV-2 RNA is generally detectable in upper and lower respiratory specimens during the acute phase of infection. Negative results do not preclude SARS-CoV-2 infection, do not rule out co-infections with other pathogens, and should not be used as the sole basis for treatment or other patient management decisions. Negative results must be combined with clinical observations, patient history, and epidemiological information. The expected result is Negative. Fact Sheet for Patients: SugarRoll.be Fact Sheet for Healthcare Providers: https://www.woods-mathews.com/ This test is not yet approved or cleared by the [place] FDA and  has been authorized for detection and/or diagnosis of SARS-CoV-2 by FDA under an Emergency Use Authorization (  EUA). This EUA will remain  in effect (meaning this test can be used) for the duration of the COVID-19 declaration under Section 56 4(b)(1) of the Act, 21 U.S.C. section 360bbb-3(b)(1), unless the authorization is terminated or revoked sooner. Performed at [facility], [place] [address] [place], [place] [address]   Culture, blood (Routine X 2) w Reflex to ID Panel     Status: None   Collection Time: 10/17/19  3:54 AM   Specimen: BLOOD  Result Value Ref Range Status   Specimen Description BLOOD LEFT HAND  Final   Special Requests   Final    BOTTLES DRAWN AEROBIC ONLY Blood Culture adequate volume   Culture   Final    NO GROWTH 5 DAYS Performed at [facility], [address], [place], [place] [address]    Report Status 10/22/2019 FINAL  Final  Culture,  blood (Routine X 2) w Reflex to ID Panel     Status: None   Collection Time: 10/17/19  3:54 AM   Specimen: BLOOD  Result Value Ref Range Status   Specimen Description BLOOD RIGHT HAND  Final   Special Requests   Final    BOTTLES DRAWN AEROBIC ONLY Blood Culture results may not be optimal due to an inadequate volume of blood received in culture bottles   Culture   Final    NO GROWTH 5 DAYS Performed at [facility], [place] [address], [place], [place] [address]    Report Status 10/22/2019 FINAL  Final      Radiology Studies: MR LIVER W WO CONTRAST  Result Date: 10/24/2019 CLINICAL DATA:  Indeterminate liver lesions on recent CT. Recently diagnosed lung carcinoma. EXAM: MRI ABDOMEN WITHOUT AND WITH CONTRAST TECHNIQUE: Multiplanar multisequence MR imaging of the abdomen was performed both before and after the administration of intravenous contrast. CONTRAST:  40mL GADAVIST GADOBUTROL 1 MMOL/ML IV SOLN COMPARISON:  CT on 10/22/2019 FINDINGS: Lower chest: No acute findings. Hepatobiliary: Innumerable small hypovascular masses are seen throughout the liver, consistent with diffuse liver metastases. Gallbladder is nearly completely collapsed. No evidence of biliary ductal dilatation. Pancreas:  No mass or inflammatory changes. Spleen:  Within normal limits in size and appearance. Adrenals/Urinary Tract: No masses identified. Small bilateral renal cysts noted. No evidence of hydronephrosis. Stomach/Bowel: Visualized portion unremarkable. Vascular/Lymphatic: Mild lymphadenopathy in porta hepatis measuring 1.9 cm short axis. No abdominal aortic aneurysm. Other:  None. Musculoskeletal: Diffuse enhancing T2 hyperintense bone lesions throughout the visualized portion of the thoracolumbar spine and pelvis, consistent with diffuse bone metastases. IMPRESSION: Diffuse liver metastases. Diffuse bone metastases. Mild abdominal lymphadenopathy in porta hepatis, consistent with metastatic disease. Electronically  Signed   By: Marlaine Hind M.D.   On: 10/24/2019 19:23    Scheduled Meds: . amLODipine  10 mg Oral Daily  . carvedilol  3.125 mg Oral BID WC  . clopidogrel  75 mg Oral Daily  . enoxaparin (LOVENOX) injection  40 mg Subcutaneous Daily  . lactulose  20 g Oral BID  . lidocaine  15 mL Mouth/Throat TID AC  . losartan  50 mg Oral Daily  . mometasone-formoterol  2 puff Inhalation BID  . nystatin  5 mL Oral QID  . pantoprazole  40 mg Oral BID  . potassium & sodium phosphates  1 packet Oral TID WC & HS  . potassium chloride  40 mEq Oral TID  . risperiDONE  1 mg Oral QHS  . umeclidinium bromide  1 puff Inhalation Daily  Continuous Infusions:   LOS: 8 days   Time spent: 35 minutes.  Patrecia Pour, MD [facility] [email or web address] 10/25/2019, 3:52 PM

## 2019-10-25 NOTE — Progress Notes (Signed)
Occupational Therapy Evaluation Patient Details Name: Drew Nguyen MRN: [id number] DOB: 1956/03/22 Today's Date: 10/25/2019    History of Present Illness Pt is  an 64 y.o. male 59 white male HTN, COPD stage II-III, cocaine/EtOH abuse, depression, rheumatoid arthritis on recently started on Plaquenil, left corona radiata CVA 03/2019 with repeat stroke 06/06/2019,?  Vocal cord malignancy.  He presented to [facility]/17/2019 secondary generalized tonic-clonic seizures witnessed by home health aide.  Pt found to have L hilar mass that is advanced small cell lung CA.  Per oncology plan for SNF to strength before treatments.   Clinical Impression   Patient reports living in single level home with caregiver assistance, but not able to provide how many hours per day the caregiver is in home. Patient reports requiring an increase in assistance with self-care and functional mobility over the last few months. Patient presents with cognitive deficits and not able to provide consistent responses with PLOF or living situation. Overall patient requires Minimal to Total A for all self-care tasks and Moderate A x 2 for functional mobility using FWW. Patient required Moderate A to maintain proper unsupported sitting balance EOB, with decreased awareness for self-correction. Patient has decreased insight into physical and cognitive deficits resulting in high fall risk status and increase in assist with self-care tasks. Patient required Minimal assist with grooming sitting EOB and total A with LB dressing. Recommend continued skilled acute OT services.    Follow Up Recommendations  SNF;Supervision/Assistance - 24 hour    Equipment Recommendations  Tub/shower bench    Recommendations for Other Services       Precautions / Restrictions Precautions Precautions: Fall Restrictions Weight Bearing Restrictions: No      Mobility Bed Mobility Overal bed mobility: Needs Assistance Bed Mobility: Supine to Sit      Supine to sit: Max assist        Transfers Overall transfer level: Needs assistance Equipment used: Rolling walker (2 wheeled) Transfers: Sit to/from Stand Sit to Stand: Mod assist;+2 physical assistance;+2 safety/equipment         General transfer comment: cues for hand placement and use of momentum; increased time    Balance Overall balance assessment: Needs assistance Sitting-balance support: Bilateral upper extremity supported;Feet supported Sitting balance-Leahy Scale: Poor     Standing balance support: Bilateral upper extremity supported;During functional activity Standing balance-Leahy Scale: Poor                             ADL either performed or assessed with clinical judgement   ADL Overall ADL's : Needs assistance/impaired Eating/Feeding: Minimal assistance   Grooming: Minimal assistance   Upper Body Bathing: Moderate assistance   Lower Body Bathing: Maximal assistance   Upper Body Dressing : Moderate assistance   Lower Body Dressing: Total assistance   Toilet Transfer: Moderate assistance;+2 for physical assistance;+2 for safety/equipment   Toileting- Clothing Manipulation and Hygiene: Moderate assistance;+2 for physical assistance;+2 for safety/equipment   Tub/ Shower Transfer: Moderate assistance;+2 for physical assistance;+2 for safety/equipment   Functional mobility during ADLs: Moderate assistance;+2 for physical assistance;+2 for safety/equipment;Rolling walker;Cueing for sequencing;Cueing for safety       Vision Baseline Vision/History: Wears glasses Wears Glasses: At all times       Perception     Praxis      Pertinent Vitals/Pain Pain Assessment: No/denies pain     Hand Dominance Right   Extremity/Trunk Assessment Upper Extremity Assessment Upper Extremity Assessment: RUE deficits/detail;LUE deficits/detail;Generalized  weakness RUE: (Grossly 3+/5, AROM WFL ) LUE: (Grossly 3+/5, AROM WFL )   Lower Extremity  Assessment Lower Extremity Assessment: Defer to PT evaluation       Communication Communication Communication: Expressive difficulties   Cognition Arousal/Alertness: Awake/alert Behavior During Therapy: WFL for tasks assessed/performed Overall Cognitive Status: No family/caregiver present to determine baseline cognitive functioning                                 General Comments: Slow to respond and required increased time and multimodal cues   General Comments       Exercises     Shoulder Instructions      Home Living Family/patient expects to be discharged to:: Private residence Living Arrangements: Children;Other (Comment) Available Help at Discharge: Personal care attendant Type of Home: House Home Access: Level entry;Ramped entrance     Home Layout: One level     Bathroom Shower/Tub: Occupational psychologist: Handicapped height Bathroom Accessibility: Yes How Accessible: Accessible via walker Home Equipment: Long Neck - 2 wheels;Wheelchair - manual;Bedside commode   Additional Comments: 3 children, ages 58,9,12      Prior Functioning/Environment Level of Independence: Needs assistance  Gait / Transfers Assistance Needed: household with cane or walker ADL's / Homemaking Assistance Needed: aide assisted with dressing and showers; pt could do toileting            OT Problem List: Decreased strength;Decreased activity tolerance;Impaired balance (sitting and/or standing);Decreased safety awareness;Decreased knowledge of use of DME or AE      OT Treatment/Interventions: Self-care/ADL training;Therapeutic exercise;Energy conservation;DME and/or AE instruction;Therapeutic activities;Cognitive remediation/compensation;Patient/family education;Balance training    OT Goals(Current goals can be found in the care plan section) Acute Rehab OT Goals Patient Stated Goal: SNF to get strong enough for CA treatments OT Goal Formulation: With  patient Time For Goal Achievement: 2019/12/01 Potential to Achieve Goals: Good  OT Frequency: Min 2X/week   Barriers to D/C:            Co-evaluation              AM-PAC OT "6 Clicks" Daily Activity     Outcome Measure Help from another person eating meals?: A Little Help from another person taking care of personal grooming?: A Little Help from another person toileting, which includes using toliet, bedpan, or urinal?: A Lot Help from another person bathing (including washing, rinsing, drying)?: A Lot Help from another person to put on and taking off regular upper body clothing?: A Lot Help from another person to put on and taking off regular lower body clothing?: A Lot 6 Click Score: 14   End of Session Equipment Utilized During Treatment: Surveyor, mining Communication: Mobility status  Activity Tolerance: Patient tolerated treatment well Patient left: in chair;with call bell/phone within reach;with chair alarm set;with nursing/sitter in room  OT Visit Diagnosis: Unsteadiness on feet (R26.81);Muscle weakness (generalized) (M62.81)                Time: 8016-5537 OT Time Calculation (min): 36 min Charges:  OT General Charges $OT Visit: 1 Visit OT Evaluation $OT Eval Moderate Complexity: 1 Mod OT Treatments $Self Care/Home Management : 8-22 mins  Diedre Maclellan OTR/L   Marlon Suleiman 10/25/2019, 2:26 PM

## 2019-10-25 NOTE — Progress Notes (Signed)
CRITICAL VALUE ALERT  Critical Value:   2.4  Date & Time Notied: 10/25/19 07:09  Provider Notified: Dr. Mathews Robinsons  Orders Received/Actions taken: Paged awaiting orders

## 2019-10-25 NOTE — Progress Notes (Signed)
Daily Progress Note   Patient Name: Drew Nguyen       Date: 10/25/2019 DOB: [birth year]  Age: 64 y.o. MRN#: [id number] Attending Physician: Patrecia Pour, MD Primary Care Physician: Boyce Medici, FNP Admit Date: 10/02/2019  Reason for Consultation/Follow-up: Establishing goals of care and Psychosocial/spiritual support  Subjective: Patient complaining of right sided chest pain, shortness of breath, and insomnia.  He states he feels awful.  Discussed symptom management with RN.  Talked with Drew Nguyen - patient's brother and HCPOA.  Drew Nguyen asked me if the patient's symptoms were coming from his lung cancer.  We talked about his cancer being in his lungs, liver, and bones.  Drew Nguyen is a very poor candidate to be resuscitated.  Drew Nguyen completely understands this (as he was an EMT and IT trainer). He will give the idea of Drew Nguyen's code status some careful thought.  He wants to respect Drew Nguyen's need to be here for his daughters as long as possible, but he does not want Drew Nguyen to suffer.  Drew Nguyen is hopeful the radiation will allow Drew Nguyen to have a better quality of life.   Assessment: Dyspnea and right sided chest pain in a patient with newly diagnosed metastatic lung cancer - tumor compressing his LUL bronchus and left pulmonary artery.  Currently receiving radiation.  Patient Profile/HPI:  64 y.o. male  with past medical history of CVA x 2, rheumatoid arthritis, COPD with on-going tobacco use, depression, and poly substance abuse who was admitted on 10/19/2019 with tonic clonic seizure presumably brought on by alcohol withdraw.  During his work up a CT chest showed a large left sided lung mass and possible liver mets.  Bronchoscopy was performed and pathology showed small cell lung cancer.  The  patient has also suffered with delirium intermittently thru out the hospitalization.    Length of Stay: 8  Current Medications: Scheduled Meds:  . amLODipine  10 mg Oral Daily  . carvedilol  12.5 mg Oral BID WC  . clopidogrel  75 mg Oral Daily  . [START ON 10/26/2019] feeding supplement (ENSURE ENLIVE)  237 mL Oral BID BM  . [START ON 10/26/2019] heparin injection (subcutaneous)  5,000 Units Subcutaneous Q8H  . lactulose  30 g Oral BID  . lidocaine  15 mL Mouth/Throat TID AC  . losartan  50 mg  Oral Daily  . mometasone-formoterol  2 puff Inhalation BID  . nystatin  5 mL Oral QID  . pantoprazole  40 mg Oral BID  . potassium & sodium phosphates  1 packet Oral TID WC & HS  . potassium chloride  40 mEq Oral TID  . risperiDONE  1 mg Oral QHS  . senna-docusate  1 tablet Oral QHS  . traZODone  50 mg Oral QHS  . umeclidinium bromide  1 puff Inhalation Daily    Continuous Infusions:   PRN Meds: acetaminophen **OR** acetaminophen, guaiFENesin-dextromethorphan, hydrALAZINE, LORazepam, [EXPIRED] LORazepam **FOLLOWED BY** [EXPIRED] LORazepam **FOLLOWED BY** [EXPIRED] LORazepam **FOLLOWED BY** LORazepam, morphine CONCENTRATE, ondansetron **OR** ondansetron (ZOFRAN) IV, phenol  Physical Exam        Chronically ill appearing pleasant male, A&O, speech intermittently garbled. Chest is non-tender to palpation.  No w/c/r  Vital Signs: BP (!) 164/78   Pulse 78   Temp 97.7 F (36.5 C) (Oral)   Resp 20   Ht 5\' 6"  (1.676 m)   Wt 70 kg   SpO2 95%   BMI 24.91 kg/m  SpO2: SpO2: 95 % O2 Device: O2 Device: Nasal Cannula O2 Flow Rate: O2 Flow Rate (L/min): 2 L/min  Intake/output summary:   Intake/Output Summary (Last 24 hours) at 10/25/2019 1823 Last data filed at 10/25/2019 1554 Gross per 24 hour  Intake --  Output 2725 ml  Net -2725 ml   LBM: Last BM Date: 10/22/19 Baseline Weight: Weight: 70 kg Most recent weight: Weight: 70 kg       Palliative Assessment/Data: 40%      Patient  Active Problem List   Diagnosis Date Noted  . Lesion of liver greater than 1 cm in diameter with history of malignant neoplasm 10/24/2019  . Abnormal CT scan, esophagus   . Small cell lung cancer in adult [facility])   . Palliative care encounter   . Small cell lung cancer, left ([place])   . Hilar adenopathy   . Seizure ([place]) 10/17/2019  . Hyponatremia 10/17/2019  . Hypokalemia   . Acute ischemic stroke ([place]) 06/06/2019  . 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 Stroke ([place]) 06/06/2019  . CVA (cerebral vascular accident) ([place]) 04/19/2019  . Essential hypertension 04/19/2019  . Cocaine abuse ([place]) 04/19/2019  . Adjustment disorder 04/19/2019  . Dysphagia 10/25/2018  . Tobacco abuse 09/13/2018  . COPD GOLD I, still smoking 09/13/2018  . Rheumatoid arthritis ([place]) 09/13/2018    Palliative Care Plan    Recommendations/Plan:  Initiated morphine SL PRN chest pain, SOB  Fan requested for sensation of dyspnea  Will allow 1 dose of ativan PRN this evening for sleep.  Will request chaplain to notarize HCPOA documentation  PMT will continue to follow intermittently.  Goals of Care and Additional Recommendations:  Limitations on Scope of Treatment: Full Scope Treatment  Code Status:  Full code   Prognosis:   widely metastatic small cell lung cancer in the setting of a deconditioned gentleman with significant RA.  Even with treatment I'm concerned his prognosis is poor.  6 months or less.  Discharge Planning:  To Be Determined  Care plan was discussed with Bedside RN  Thank you for allowing the Palliative Medicine Team to assist in the care of this patient.  Total time spent:  35 min.     Greater than 50%  of this time was spent counseling and coordinating care related to the above assessment and plan.  Florentina Jenny, PA-C Palliative Medicine  Please contact  Palliative MedicineTeam phone at [phone] for questions and concerns between 7 am - 7 pm.   Please see AMION  for individual provider pager numbers.

## 2019-10-26 LAB — CBC
HCT: 37.8 % — ABNORMAL LOW (ref 39.0–52.0)
Hemoglobin: 12.3 g/dL — ABNORMAL LOW (ref 13.0–17.0)
MCH: 31.3 pg (ref 26.0–34.0)
MCHC: 32.5 g/dL (ref 30.0–36.0)
MCV: 96.2 fL (ref 80.0–100.0)
Platelets: 159 10*3/uL (ref 150–400)
RBC: 3.93 MIL/uL — ABNORMAL LOW (ref 4.22–5.81)
RDW: 13 % (ref 11.5–15.5)
WBC: 14.2 10*3/uL — ABNORMAL HIGH (ref 4.0–10.5)
nRBC: 0 % (ref 0.0–0.2)

## 2019-10-26 LAB — COMPREHENSIVE METABOLIC PANEL
ALT: 375 U/L — ABNORMAL HIGH (ref 0–44)
AST: 104 U/L — ABNORMAL HIGH (ref 15–41)
Albumin: 3 g/dL — ABNORMAL LOW (ref 3.5–5.0)
Alkaline Phosphatase: 376 U/L — ABNORMAL HIGH (ref 38–126)
Anion gap: 12 (ref 5–15)
BUN: 24 mg/dL — ABNORMAL HIGH (ref 8–23)
CO2: 26 mmol/L (ref 22–32)
Calcium: 8.6 mg/dL — ABNORMAL LOW (ref 8.9–10.3)
Chloride: 103 mmol/L (ref 98–111)
Creatinine, Ser: 0.61 mg/dL (ref 0.61–1.24)
GFR calc Af Amer: >60 mL/min (ref >60)
GFR calc non Af Amer: >60 mL/min (ref >60)
Glucose, Bld: 228 mg/dL — ABNORMAL HIGH (ref 70–99)
Potassium: 2.9 mmol/L — ABNORMAL LOW (ref 3.5–5.1)
Sodium: 141 mmol/L (ref 135–145)
Total Bilirubin: 0.8 mg/dL (ref 0.3–1.2)
Total Protein: 5.8 g/dL — ABNORMAL LOW (ref 6.5–8.1)

## 2019-10-26 LAB — PROTIME-INR
INR: 1 (ref 0.8–1.2)
Prothrombin Time: 13.1 seconds (ref 11.4–15.2)

## 2019-10-26 LAB — MAGNESIUM: Magnesium: 1.8 mg/dL (ref 1.7–2.4)

## 2019-10-26 MED ORDER — POTASSIUM CHLORIDE 20 MEQ/15ML (10%) PO SOLN
40.0000 meq | Freq: Three times a day (TID) | ORAL | Status: DC
  Administered 2019-10-26 – 2019-10-27 (×2): 40 meq via ORAL
  Filled 2019-10-26 (×2): qty 30

## 2019-10-26 MED ORDER — SALINE SPRAY 0.65 % NA SOLN
1.0000 | NASAL | Status: DC | PRN
  Administered 2019-10-26: 1 via NASAL
  Filled 2019-10-26: qty 44

## 2019-10-26 MED ORDER — PREDNISONE 10 MG PO TABS
20.0000 mg | ORAL_TABLET | Freq: Every day | ORAL | Status: DC
  Administered 2019-10-26 – 2019-11-04 (×8): 20 mg via ORAL
  Filled 2019-10-26 (×2): qty 1
  Filled 2019-10-26: qty 2
  Filled 2019-10-26 (×5): qty 1
  Filled 2019-10-26: qty 2
  Filled 2019-10-26: qty 1

## 2019-10-26 MED ORDER — MAGNESIUM SULFATE 2 GM/50ML IV SOLN
2.0000 g | Freq: Once | INTRAVENOUS | Status: AC
  Administered 2019-10-26: 12:00:00 2 g via INTRAVENOUS
  Filled 2019-10-26: qty 50

## 2019-10-26 MED ORDER — ISOSORB DINITRATE-HYDRALAZINE 20-37.5 MG PO TABS
1.0000 | ORAL_TABLET | Freq: Three times a day (TID) | ORAL | Status: DC
  Administered 2019-10-26 – 2019-11-01 (×16): 1 via ORAL
  Filled 2019-10-26 (×21): qty 1

## 2019-10-26 MED ORDER — POLYVINYL ALCOHOL 1.4 % OP SOLN
1.0000 [drp] | OPHTHALMIC | Status: DC | PRN
  Administered 2019-10-26: 17:00:00 1 [drp] via OPHTHALMIC
  Filled 2019-10-26: qty 15

## 2019-10-26 NOTE — Progress Notes (Signed)
PROGRESS NOTE  Drew Nguyen  IOE:[id number] DOB: January 23, 1956 DOA: 09/27/2019 PCP: Boyce Medici, FNP   Brief Narrative: Drew Nguyen is a 64 y.o. 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 MRI brain showed remote lacunar infarcts without acute abnormality. Later EEG was reportedly normal. CXR was abnormal and subsequent CT chest demonstrated a hilar mass with associated adenopathy and compression of left upper lobe bronchus and pulmonary artery with distal atelectasis. This also showed distal esophageal thickening and nonspecific left adrenal thickening. 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 CT abdomen/pelvis performed to follow up left adrenal findings revealed a nonspecific nodule with innumerable hypovascular areas in the liver confirmed on subsequent MRI, which also showed diffuse enhancing T2 hyperintense bone lesions throughout the visualized portion of the thoracolumbar spine and pelvis, consistent with diffuse bone metastases.  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 - Appreciate oncology consultation, will require improvement in functional status prior to systemic therapy.  - Radonc recommended transfer to [facility] to undergo inpatient  radiation therapy. - Palliative care team also consulted.   Hepatic metastatic disease with hepatic encephalopathy: With LFT elevation, normal bilirubin. INR 1.0 indicating preserved synthetic function. Hepatitis panel negative.  - Continue lactulose, seems to have improved mentation, though not at goal stools, increase dose as below.  - LFTs stable overall.  LUL, postobstructive CAP: Completed doxycycline 2/26 - 3/2.  - With progressive volume loss of left lung on CXR 3/8.   Hyponatremia: POA, possibly beer potomania, currently resolved.   Generalized tonic-clonic seizures/acute toxic metabolic encephalopathy (presenting symptom): No brain metastases, confirmed remote lacunar infarcts in bilateral basal ganglia and corona radiata, atrophy, EEG was within normal limits.  - Neurology has signed off   Hypokalemia: Severe, only somewhat improved with aggressive supplementation - Change to solution of potassium and check in AM. Supp mag for assist.  History of recurrent CVA:  - Hold plavix (held around time of biopsy), statin not indicated due to elevated LFTs and LDL of 53  Slurred speech: Worsening this PM. Multifactorial most likely due to encephalopathy/possibly paraneoplastic, dry mouth causing dysarthria. Subtle right-sided lower facial weakness is only focal deficit on exam. CT head is without acute stroke.  - Continue augmented lactulose Tx.  - Check CT head to r/o CVA - Supportive care otherwise. No stools today, aiming for 3 or so loose stools with lactulose, will increase dosing. If continues and/or waxes/wanes, consider changing risperdal and other psychoactive medications.  Hypophosphatemia:  - Monitor in AM and supplement regularly  Hypertension with hypertensiveurgency: Shile no longer severe, will need improved control.  - Continue norvasc 10mg  - Increased losartan which may help hypokalemia.  - Started coreg, had short run of PSVT  3/6 improved with augmented dosing.    - Continue prn hydralazine.  - Add bidil if he can tolerate.  Right-sided chest pain: ECG unchanged without ischemic features, not exertional/typical. Resolved.  Rheumatoid arthritis on chronic immunosuppression:  - Tapering down from stress-dose steroids, can taper further now to prednisone 20mg . Hypertensive. Baseline prednisone of late said to be 15mg .  - Holding plaquenil and enbrel  Polysubstance abuse, alcohol abuse, tobacco use:  - Cessation counseling provided.  - Monitor for evidence of withdrawal, though now outside window for this.  COPD: No exacerbation.  - Continue bronchodilators.   Esophageal thickening:  - Appreciate GI consultation, giving PPI BID, nystatin for possibility of thrush, and viscous lidocaine. Will change oral medications to solution form inasmuch as that is possible  Depression: Continue Celexa and Risperdal  FTT, muscular deconditioning:  - Continue PT/OT and SNF recommendations.  D/w pt again today. - Supplement protein with hypoalbuminemia. Ensure.   Bleeding at lovenox injection site: Minimal total blood loss. Hgb relatively stable.  - Hold antiplatelet and prophylactic anticoagulation because of ongoing oozing. Risk/benefit of this was discussed with patient and brother at bedside.  - Monitor site, keep pressure bandage, trend CBC. - INR 1.0  DVT prophylaxis: Heparin Code Status: Full Family Communication: None at bedside Disposition Plan: Patient from home, continues to have multiple severe medical problems undergoing active treatment. SNF recommended, though patient reluctant.   Consultants:   Oncology  Neurology  PCCM  Radiation oncology  Palliative care  GI  Procedures:   EEG Flexible video fiberoptic bronchoscopy with endobronchial ultrasound and biopsies on March 2 by Dr. Shearon Stalls  Antimicrobials:  Doxycycline 2/26 - 3/2   Subjective: Right-sided chest pain improving, very slow oozing of blood from abdominal  ecchymosis, no change to ecchymosis itself. Shortness of breath is improved today, though moderate with exertion. Morphine helps. Not having consistent stools.  Objective: Vitals:   10/26/19 0725 10/26/19 0816 10/26/19 0903 10/26/19 1428  BP: (!) 169/90 (!) 174/91 (!) 174/87 120/72  Pulse: 69 84 81 79  Resp:    18  Temp:    (!) 97.3 F (36.3 C)  TempSrc:      SpO2:  94% 96% 93%  Weight:      Height:        Intake/Output Summary (Last 24 hours) at 10/26/2019 1808 Last data filed at 10/26/2019 0912 Gross per 24 hour  Intake --  Output 1125 ml  Net -1125 ml   Filed Weights   10/15/2019 2057 11/11/2019 1126  Weight: 70 kg 70 kg   Gen: 64 y.o. male in no distress Pulm: Nonlabored breathing room air. Diminished on left, no wheezes or crackles. CV: Regular rate and rhythm. No murmur, rub, or gallop. No JVD, no significant dependent edema. GI: Abdomen soft, non-tender, non-distended, with normoactive bowel sounds.  Ext: Warm, no deformities Skin: No new rashes, lesions or ulcers on visualized skin. Ecchymosis on right midabdomen is stable with very slow ooze of red blood. Neuro: Alert and oriented. Slurred speech stable. No asterixis or focal neurological deficits. Psych: Judgement and insight appear fair. Mood euthymic & affect congruent. Behavior is appropriate.    Data Reviewed: I have personally reviewed following labs and imaging studies  CBC: Recent Labs  Lab 10/20/19 0117 10/23/19 1116 10/24/19 0811 10/25/19 0545 10/26/19 0548  WBC 17.0* 17.1* 15.8* 14.2* 14.2*  NEUTROABS 15.3* 15.0* 13.2* 12.0*  --   HGB 13.9 13.3 12.5* 12.5* 12.3*  HCT 40.4 38.9* 36.7* 37.6* 37.8*  MCV 92.4 92.0 93.6 95.7 96.2  PLT 260 208 189 166 735   Basic Metabolic Panel: Recent Labs  Lab 10/30/2019 0126 10/30/2019 1603 10/22/19 0959 10/23/19 1116 10/24/19 0811 10/25/19 0545 10/26/19 0548  NA  --  141  --  143 141 140 141  K  --  3.4*  --  3.5 3.0* 2.4* 2.9*  CL  --  106  --  107 102 103 103   CO2  --  25  --  22 25 27 26   GLUCOSE  --  156*  --  188* 165* 153* 228*  BUN  --  12  --  11 13 17  24*  CREATININE  --  0.63  --  0.62 0.73 0.52* 0.61  CALCIUM  --  7.9*  --  8.3* 8.2* 8.2* 8.6*  MG   < >  --  1.6* 1.5* 1.8 1.8 1.8  PHOS  --   --  1.5* 1.5* 2.5 2.7  --    < > = values in this interval not displayed.   GFR: Estimated Creatinine Clearance: 84.2 mL/min (by C-G formula based on SCr of 0.61 mg/dL). Liver Function Tests: Recent Labs  Lab 10/22/19 0959 10/23/19 1116 10/24/19 0811 10/25/19 0545 10/26/19 0548  AST 131* 290* 207* 167* 104*  ALT 326* 521* 500* 466* 375*  ALKPHOS 230* 319* 363* 361* 376*  BILITOT 0.9 1.1 1.0 1.0 0.8  PROT 5.8* 5.8* 5.6* 6.0* 5.8*  ALBUMIN 3.2* 3.3* 3.0* 3.2* 3.0*   No results for input(s): LIPASE, AMYLASE in the last 168 hours. Recent Labs  Lab 11/14/2019 1603 10/23/19 1116 10/24/19 0811  AMMONIA 66* 67* 52*   Coagulation Profile: Recent Labs  Lab 10/26/19 0548  INR 1.0   Cardiac Enzymes: No results for input(s): CKTOTAL, CKMB, CKMBINDEX, TROPONINI in the last 168 hours. BNP (last 3 results) No results for input(s): PROBNP in the last 8760 hours. HbA1C: No results for input(s): HGBA1C in the last 72 hours. CBG: No results for input(s): GLUCAP in the last 168 hours. Lipid Profile: No results for input(s): CHOL, HDL, LDLCALC, TRIG, CHOLHDL, LDLDIRECT in the last 72 hours. Thyroid Function Tests: No results for input(s): TSH, T4TOTAL, FREET4, T3FREE, THYROIDAB in the last 72 hours. Anemia Panel: No results for input(s): VITAMINB12, FOLATE, FERRITIN, TIBC, IRON, RETICCTPCT in the last 72 hours. Urine analysis:    Component Value Date/Time   COLORURINE YELLOW 10/17/2019 2238   APPEARANCEUR CLEAR 10/13/2019 2238   LABSPEC 1.013 09/30/2019 2238   PHURINE 7.0 10/01/2019 2238   GLUCOSEU NEGATIVE 10/12/2019 2238   HGBUR NEGATIVE 10/04/2019 2238   [place] 10/10/2019 2238   KETONESUR NEGATIVE 09/28/2019 2238    PROTEINUR 100 (A) 10/13/2019 2238   UROBILINOGEN 2.0 (H) 05/03/2010 2059   NITRITE NEGATIVE 10/18/2019 2238   LEUKOCYTESUR NEGATIVE 10/12/2019 2238   Recent Results (from the past 240 hour(s))  SARS CORONAVIRUS 2 (TAT 6-24 HRS) Nasopharyngeal Nasopharyngeal Swab     Status: None   Collection Time: 10/01/2019 11:59 PM   Specimen: Nasopharyngeal Swab  Result Value Ref Range Status   SARS Coronavirus 2 NEGATIVE NEGATIVE Final    Comment: (NOTE) SARS-CoV-2 target nucleic acids are NOT DETECTED. The SARS-CoV-2 RNA is generally detectable in upper and lower respiratory specimens during the acute phase of infection. Negative results do not preclude SARS-CoV-2 infection, do not rule out co-infections with other pathogens, and should not be used as the sole basis for treatment or other patient management decisions. Negative results must  be combined with clinical observations, patient history, and epidemiological information. The expected result is Negative. Fact Sheet for Patients: SugarRoll.be Fact Sheet for Healthcare Providers: https://www.woods-mathews.com/ This test is not yet approved or cleared by the [place] FDA and  has been authorized for detection and/or diagnosis of SARS-CoV-2 by FDA under an Emergency Use Authorization (EUA). This EUA will remain  in effect (meaning this test can be used) for the duration of the COVID-19 declaration under Section 56 4(b)(1) of the Act, 21 U.S.C. section 360bbb-3(b)(1), unless the authorization is terminated or revoked sooner. Performed at [facility], [place] [address], [place], [place] [address]   Culture, blood (Routine X 2) w Reflex to ID Panel     Status: None   Collection Time: 10/17/19  3:54 AM   Specimen: BLOOD  Result Value Ref Range Status   Specimen Description BLOOD LEFT HAND  Final   Special Requests   Final    BOTTLES DRAWN AEROBIC ONLY Blood Culture adequate volume   Culture    Final    NO GROWTH 5 DAYS Performed at [facility], [address] [place], [place] [address]    Report Status 10/22/2019 FINAL  Final  Culture, blood (Routine X 2) w Reflex to ID Panel     Status: None   Collection Time: 10/17/19  3:54 AM   Specimen: BLOOD  Result Value Ref Range Status   Specimen Description BLOOD RIGHT HAND  Final   Special Requests   Final    BOTTLES DRAWN AEROBIC ONLY Blood Culture results may not be optimal due to an inadequate volume of blood received in culture bottles   Culture   Final    NO GROWTH 5 DAYS Performed at [facility], [place] [address] [place], [place] [address]    Report Status 10/22/2019 FINAL  Final      Radiology Studies: DG Chest 2 View  Result Date: 10/25/2019 CLINICAL DATA:  Cough. Right-sided chest pain. Shortness of breath. EXAM: CHEST - 2 VIEW COMPARISON:  Radiograph CT 01/08/2020. Chest CT 10/17/2019 FINDINGS: Progressive volume loss in the left hemithorax with progressive opacification throughout, patient with known left hilar mass. This is likely increasing postobstructive atelectasis throughout the left lung. Difficult to exclude an element of pleural effusion. Right lung is slightly hyperinflated but clear. Cardiomediastinal contours are obscured by left lung opacity. No evidence of right pulmonary edema. No visualized pneumothorax. IMPRESSION: Progressive volume loss in the left hemithorax with increased opacification, likely increasing postobstructive atelectasis throughout the left lung given history of left hilar mass and upper lobe collapse on recent CT. Difficult to exclude an element of pleural fluid on the left. Electronically Signed   By: Keith Rake M.D.   On: 10/25/2019 19:26   CT HEAD WO CONTRAST  Result Date: 10/25/2019 CLINICAL DATA:  Slurred speech, history of CVAs, neuro deficit, acute, stroke suspected. EXAM: CT HEAD WITHOUT CONTRAST TECHNIQUE: Contiguous axial images were obtained from the base of the  skull through the vertex without intravenous contrast. COMPARISON:  Brain MRI 10/17/2019, head CT 10/07/2019. FINDINGS: Brain: There is no evidence of acute intracranial hemorrhage, intracranial mass, midline shift or extra-axial fluid collection.No demarcated cortical infarction. Redemonstrated chronic lacunar infarcts within the bilateral corona radiata/basal ganglia. Background ill-defined hypoattenuation within the cerebral white matter is nonspecific, but consistent with chronic small vessel ischemic disease. Stable, mild generalized parenchymal atrophy. Vascular: No hyperdense vessel.  Atherosclerotic calcifications. Skull: Normal. Negative for fracture or focal lesion. Sinuses/Orbits:  Visualized orbits demonstrate no acute abnormality. No significant paranasal sinus disease or mastoid effusion at the imaged levels. IMPRESSION: No evidence of acute intracranial abnormality. Redemonstrated chronic bilateral corona radiata/basal ganglia lacunar infarcts. Stable background generalized parenchymal atrophy and chronic small vessel ischemic disease. Electronically Signed   By: Kellie Simmering DO   On: 10/25/2019 18:33   MR LIVER W WO CONTRAST  Result Date: 10/24/2019 CLINICAL DATA:  Indeterminate liver lesions on recent CT. Recently diagnosed lung carcinoma. EXAM: MRI ABDOMEN WITHOUT AND WITH CONTRAST TECHNIQUE: Multiplanar multisequence MR imaging of the abdomen was performed both before and after the administration of intravenous contrast. CONTRAST:  70mL GADAVIST GADOBUTROL 1 MMOL/ML IV SOLN COMPARISON:  CT on 10/22/2019 FINDINGS: Lower chest: No acute findings. Hepatobiliary: Innumerable small hypovascular masses are seen throughout the liver, consistent with diffuse liver metastases. Gallbladder is nearly completely collapsed. No evidence of biliary ductal dilatation. Pancreas:  No mass or inflammatory changes. Spleen:  Within normal limits in size and appearance. Adrenals/Urinary Tract: No masses identified.  Small bilateral renal cysts noted. No evidence of hydronephrosis. Stomach/Bowel: Visualized portion unremarkable. Vascular/Lymphatic: Mild lymphadenopathy in porta hepatis measuring 1.9 cm short axis. No abdominal aortic aneurysm. Other:  None. Musculoskeletal: Diffuse enhancing T2 hyperintense bone lesions throughout the visualized portion of the thoracolumbar spine and pelvis, consistent with diffuse bone metastases. IMPRESSION: Diffuse liver metastases. Diffuse bone metastases. Mild abdominal lymphadenopathy in porta hepatis, consistent with metastatic disease. Electronically Signed   By: Marlaine Hind M.D.   On: 10/24/2019 19:23    Scheduled Meds: . amLODipine  10 mg Oral Daily  . carvedilol  12.5 mg Oral BID WC  . feeding supplement (ENSURE ENLIVE)  237 mL Oral BID BM  . isosorbide-hydrALAZINE  1 tablet Oral TID  . lactulose  30 g Oral BID  . lidocaine  15 mL Mouth/Throat TID AC  . losartan  50 mg Oral Daily  . mometasone-formoterol  2 puff Inhalation BID  . nystatin  5 mL Oral QID  . pantoprazole  40 mg Oral BID  . potassium & sodium phosphates  1 packet Oral TID WC & HS  . potassium chloride  40 mEq Oral TID  . predniSONE  20 mg Oral QAC breakfast  . risperiDONE  1 mg Oral QHS  . senna-docusate  1 tablet Oral QHS  . traZODone  50 mg Oral QHS  . umeclidinium bromide  1 puff Inhalation Daily   Continuous Infusions:   LOS: 9 days   Time spent: 35 minutes.  Patrecia Pour, MD [facility] [email or web address] 10/26/2019, 6:08 PM

## 2019-10-26 NOTE — Progress Notes (Signed)
Pt was sitting up in bed when I arrived. He wanted his AD notarized. I explained that we will have a notary on campus tomorrow and I would leave a message for someone to notarize AD tomorrow. Pt understood and was thankful for visit. Please page if additional assistance is needed. [place], MDiv   10/26/19 1900  Clinical Encounter Type  Visited With Patient

## 2019-10-27 ENCOUNTER — Ambulatory Visit
Admit: 2019-10-27 | Discharge: 2019-10-27 | Disposition: A | Discharge status: Home / Self Care | Payer: Medicaid Other | Attending: Radiation Oncology | Admitting: Radiation Oncology

## 2019-10-27 ENCOUNTER — Ambulatory Visit: Payer: Self-pay | Admitting: Adult Health

## 2019-10-27 DIAGNOSIS — Z7189 Other specified counseling: Secondary | ICD-10-CM

## 2019-10-27 DIAGNOSIS — R52 Pain, unspecified: Secondary | ICD-10-CM

## 2019-10-27 DIAGNOSIS — Z515 Encounter for palliative care: Secondary | ICD-10-CM

## 2019-10-27 LAB — COMPREHENSIVE METABOLIC PANEL
ALT: 319 U/L — ABNORMAL HIGH (ref 0–44)
AST: 113 U/L — ABNORMAL HIGH (ref 15–41)
Albumin: 2.9 g/dL — ABNORMAL LOW (ref 3.5–5.0)
Alkaline Phosphatase: 360 U/L — ABNORMAL HIGH (ref 38–126)
Anion gap: 9 (ref 5–15)
BUN: 25 mg/dL — ABNORMAL HIGH (ref 8–23)
CO2: 27 mmol/L (ref 22–32)
Calcium: 8.2 mg/dL — ABNORMAL LOW (ref 8.9–10.3)
Chloride: 105 mmol/L (ref 98–111)
Creatinine, Ser: 0.55 mg/dL — ABNORMAL LOW (ref 0.61–1.24)
GFR calc Af Amer: >60 mL/min (ref >60)
GFR calc non Af Amer: >60 mL/min (ref >60)
Glucose, Bld: 162 mg/dL — ABNORMAL HIGH (ref 70–99)
Potassium: 3.5 mmol/L (ref 3.5–5.1)
Sodium: 141 mmol/L (ref 135–145)
Total Bilirubin: 1 mg/dL (ref 0.3–1.2)
Total Protein: 5.7 g/dL — ABNORMAL LOW (ref 6.5–8.1)

## 2019-10-27 LAB — CBC
HCT: 32.9 % — ABNORMAL LOW (ref 39.0–52.0)
Hemoglobin: 10.8 g/dL — ABNORMAL LOW (ref 13.0–17.0)
MCH: 31.6 pg (ref 26.0–34.0)
MCHC: 32.8 g/dL (ref 30.0–36.0)
MCV: 96.2 fL (ref 80.0–100.0)
Platelets: 153 10*3/uL (ref 150–400)
RBC: 3.42 MIL/uL — ABNORMAL LOW (ref 4.22–5.81)
RDW: 13.1 % (ref 11.5–15.5)
WBC: 14 10*3/uL — ABNORMAL HIGH (ref 4.0–10.5)
nRBC: 0.2 % (ref 0.0–0.2)

## 2019-10-27 LAB — MAGNESIUM: Magnesium: 2.1 mg/dL (ref 1.7–2.4)

## 2019-10-27 MED ORDER — POTASSIUM CHLORIDE 20 MEQ/15ML (10%) PO SOLN
40.0000 meq | Freq: Two times a day (BID) | ORAL | Status: DC
  Administered 2019-10-28 – 2019-10-31 (×5): 40 meq via ORAL
  Filled 2019-10-27 (×5): qty 30

## 2019-10-27 NOTE — NC FL2 (Signed)
[place] LEVEL OF CARE SCREENING TOOL     IDENTIFICATION  Patient Name: Drew Nguyen Birthdate: [birth year] Sex: male Admission Date (Current Location): 10/02/2019  [facility] and [place] Number:  Herbalist and Address:  [facility],  [address], [place]      Provider Number: [id number]  Attending Physician Name and Address:  Patrecia Pour, MD  Relative Name and Phone Number:       Current Level of Care: Hospital Recommended Level of Care: [place] Prior Approval Number:    Date Approved/Denied:   PASRR Number: [id number]  Discharge Plan: SNF    Current Diagnoses: Patient Active Problem List   Diagnosis Date Noted  . Lesion of liver greater than 1 cm in diameter with history of malignant neoplasm 10/24/2019  . Abnormal CT scan, esophagus   . 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 Palliative care encounter   . Small cell lung cancer, left ([place])   . Hilar adenopathy   . Seizure ([place]) 10/17/2019  . Hyponatremia 10/17/2019  . Hypokalemia   . Acute ischemic stroke ([place]) 06/06/2019  . 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 Stroke ([place]) 06/06/2019  . CVA (cerebral vascular accident) ([place]) 04/19/2019  . Essential hypertension 04/19/2019  . Cocaine abuse ([place]) 04/19/2019  . Adjustment disorder 04/19/2019  . Dysphagia 10/25/2018  . Tobacco abuse 09/13/2018  . COPD GOLD I, still smoking 09/13/2018  . Rheumatoid arthritis ([place]) 09/13/2018    Orientation RESPIRATION BLADDER Height & Weight     Self, Time, Place, Situation  Normal Continent Weight: 70 kg Height:  5\' 6"  (167.6 cm)  BEHAVIORAL SYMPTOMS/MOOD NEUROLOGICAL BOWEL NUTRITION STATUS    Convulsions/Seizures(had a seizure at home, was brought to hospital as result) Incontinent Diet  AMBULATORY STATUS COMMUNICATION OF NEEDS Skin   Extensive Assist Verbally Normal                       Personal Care Assistance Level of Assistance   Bathing, Dressing, Total care Bathing Assistance: Maximum assistance   Dressing Assistance: Maximum assistance Total Care Assistance: Maximum assistance   Functional Limitations Info             SPECIAL CARE FACTORS FREQUENCY  PT (By licensed PT), OT (By licensed OT)     PT Frequency: 5x weekly OT Frequency: 5x weekly            Contractures Contractures Info: Not present    Additional Factors Info  Code Status, Allergies Code Status Info: full Allergies Info: celexa           Current Medications (10/27/2019):  This is the current hospital active medication list Current Facility-Administered Medications  Medication Dose Route Frequency Provider Last Rate Last Admin  . acetaminophen (TYLENOL) tablet 650 mg  650 mg Oral Q6H PRN Rise Patience, MD   650 mg at 10/27/19 9629   Or  . acetaminophen (TYLENOL) suppository 650 mg  650 mg Rectal Q6H PRN Rise Patience, MD      . amLODipine (NORVASC) tablet 10 mg  10 mg Oral Daily Jennye Boroughs, MD   10 mg at 10/27/19 1201  . carvedilol (COREG) tablet 12.5 mg  12.5 mg Oral BID WC Patrecia Pour, MD   12.5 mg at 10/27/19 0923  . feeding supplement (ENSURE ENLIVE) (ENSURE ENLIVE) liquid 237 mL  237 mL Oral BID BM Patrecia Pour, MD  237 mL at 10/26/19 1024  . guaiFENesin-dextromethorphan (ROBITUSSIN DM) 100-10 MG/5ML syrup 5 mL  5 mL Oral Q4H PRN Nita Sells, MD   5 mL at 10/25/19 0454  . hydrALAZINE (APRESOLINE) injection 10 mg  10 mg Intravenous Q4H PRN Rise Patience, MD   10 mg at 10/26/19 0735  . isosorbide-hydrALAZINE (BIDIL) 20-37.5 MG per tablet 1 tablet  1 tablet Oral TID Patrecia Pour, MD   1 tablet at 10/27/19 1200  . lactulose (CHRONULAC) 10 GM/15ML solution 30 g  30 g Oral BID Patrecia Pour, MD   30 g at 10/26/19 1023  . lidocaine (XYLOCAINE) 2 % viscous mouth solution 15 mL  15 mL Mouth/Throat TID AC Zehr, Jessica D, PA-C   15 mL at 10/27/19 1205  . LORazepam (ATIVAN) injection 1 mg  1 mg  Intravenous Q6H PRN Rise Patience, MD   1 mg at 11/02/2019 0009  . LORazepam (ATIVAN) tablet 0.5 mg  0.5 mg Oral Q6H PRN Dellinger, Marianne L, PA-C   0.5 mg at 10/25/19 2155  . losartan (COZAAR) tablet 50 mg  50 mg Oral Daily Florencia Reasons, MD   50 mg at 10/27/19 1201  . mometasone-formoterol (DULERA) 200-5 MCG/ACT inhaler 2 puff  2 puff Inhalation BID Rise Patience, MD   2 puff at 10/26/19 1919  . morphine CONCENTRATE 10 MG/0.5ML oral solution 5 mg  5 mg Sublingual Q2H PRN Dellinger, Marianne L, PA-C   5 mg at 10/26/19 1514  . nystatin (MYCOSTATIN) 100000 UNIT/ML suspension 500,000 Units  5 mL Oral QID Loralie Champagne, PA-C   500,000 Units at 10/26/19 1728  . ondansetron (ZOFRAN) tablet 4 mg  4 mg Oral Q6H PRN Rise Patience, MD       Or  . ondansetron [facility]) injection 4 mg  4 mg Intravenous Q6H PRN Rise Patience, MD      . pantoprazole (PROTONIX) EC tablet 40 mg  40 mg Oral BID Loralie Champagne, PA-C   40 mg at 10/27/19 1201  . phenol (CHLORASEPTIC) mouth spray 1 spray  1 spray Mouth/Throat PRN Nita Sells, MD   1 spray at 10/19/19 1703  . polyvinyl alcohol (LIQUIFILM TEARS) 1.4 % ophthalmic solution 1 drop  1 drop Both Eyes PRN Patrecia Pour, MD   1 drop at 10/26/19 1725  . potassium & sodium phosphates (PHOS-NAK) 280-160-250 MG packet 1 packet  1 packet Oral TID WC & HS Florencia Reasons, MD   1 packet at 10/27/19 1202  . potassium chloride 20 MEQ/15ML (10%) solution 40 mEq  40 mEq Oral TID Patrecia Pour, MD   40 mEq at 10/27/19 1204  . predniSONE (DELTASONE) tablet 20 mg  20 mg Oral QAC breakfast Patrecia Pour, MD   20 mg at 10/27/19 1062  . risperiDONE (RISPERDAL M-TABS) disintegrating tablet 1 mg  1 mg Oral QHS Nita Sells, MD   1 mg at 10/26/19 0150  . senna-docusate (Senokot-S) tablet 1 tablet  1 tablet Oral QHS Dellinger, Marianne L, PA-C   1 tablet at 10/26/19 0209  . sodium chloride (OCEAN) 0.65 % nasal spray 1 spray  1 spray Each Nare PRN Patrecia Pour, MD    1 spray at 10/26/19 1726  . traZODone (DESYREL) tablet 50 mg  50 mg Oral QHS Patrecia Pour, MD   50 mg at 10/26/19 0152  . umeclidinium bromide (INCRUSE ELLIPTA) 62.5 MCG/INH 1 puff  1 puff Inhalation Daily Margaretha Seeds,  MD   1 puff at 10/25/19 1126     Discharge Medications: Please see discharge summary for a list of discharge medications.  Relevant Imaging Results:  Relevant Lab Results:   Additional Information [id number] [id number]  Joaquin Courts, RN

## 2019-10-27 NOTE — Progress Notes (Signed)
HEMATOLOGY-ONCOLOGY PROGRESS NOTE  SUBJECTIVE: The patient reports some shortness of breath this morning.  Denies pain.  Difficult to obtain review of systems secondary to patient's encephalopathy.  Still having some confusion and intermittent slurred speech.  REVIEW OF SYSTEMS:   Noncontributory except as noted in HPI.  I have reviewed the past medical history, past surgical history, social history and family history with the patient and they are unchanged from previous note.   PHYSICAL EXAMINATION: ECOG PERFORMANCE STATUS: 3 - Symptomatic, >50% confined to bed  Vitals:   10/26/19 2202 10/27/19 0409  BP: (!) 153/80 (!) 147/77  Pulse: 75 83  Resp: 19 18  Temp: 97.9 F (36.6 C) 97.9 F (36.6 C)  SpO2: 91% 92%   Filed Weights   10/01/2019 2057 11/09/2019 1126  Weight: 70 kg 70 kg    Intake/Output from previous day: 03/07 0701 - 03/08 0700 In: -  Out: 1600 [Urine:1600]  General: Chronically ill-appearing male, no distress, difficult to understand at times due to slurred speech Eyes:  no scleral icterus.  Marland Kitchen   Respiratory: lungs were clear bilaterally without wheezing or crackles.   Cardiovascular:  Regular rate and rhythm, S1/S2, without murmur, rub or gallop. There was no pedal edema.   GI:  abdomen was soft, flat, nontender, nondistended, without organomegaly.     Skin: Multiple bruises noted on his upper extremities, no petechiae MSK: Strength 4/5 in the bilateral lower extremities. Neuro exam was nonfocal.  The patient is alert and oriented to person and place but not to time.  Speech was not pressured.  Thought tangential at times.  LABORATORY DATA:  I have reviewed the data as listed CMP Latest Ref Rng & Units 10/27/2019 10/26/2019 10/25/2019  Glucose 70 - 99 mg/dL 162(H) 228(H) 153(H)  BUN 8 - 23 mg/dL 25(H) 24(H) 17  Creatinine 0.61 - 1.24 mg/dL 0.55(L) 0.61 0.52(L)  Sodium 135 - 145 mmol/L 141 141 140  Potassium 3.5 - 5.1 mmol/L 3.5 2.9(L) 2.4(LL)  Chloride 98 - 111  mmol/L 105 103 103  CO2 22 - 32 mmol/L 27 26 27   Calcium 8.9 - 10.3 mg/dL 8.2(L) 8.6(L) 8.2(L)  Total Protein 6.5 - 8.1 g/dL 5.7(L) 5.8(L) 6.0(L)  Total Bilirubin 0.3 - 1.2 mg/dL 1.0 0.8 1.0  Alkaline Phos 38 - 126 U/L 360(H) 376(H) 361(H)  AST 15 - 41 U/L 113(H) 104(H) 167(H)  ALT 0 - 44 U/L 319(H) 375(H) 466(H)    Lab Results  Component Value Date   WBC 14.0 (H) 10/27/2019   HGB 10.8 (L) 10/27/2019   HCT 32.9 (L) 10/27/2019   MCV 96.2 10/27/2019   PLT 153 10/27/2019   NEUTROABS 12.0 (H) 10/25/2019    EEG  Result Date: 10/17/2019 Lora Havens, MD     10/17/2019  1:38 PM Patient Name: Drew Nguyen MRN: [id number] Epilepsy Attending: Lora Havens Referring Physician/Provider: Dr. Gean Birchwood Date: 10/17/2019 Duration: 25.26 minutes Patient history: 63 year old male with history of alcohol use, cocaine use who presented with generalized tonic-clonic seizure-like episode.  EEG to evaluate for seizures. Level of alertness: Awake AEDs during EEG study: Ativan Technical aspects: This EEG study was done with scalp electrodes positioned according to the 10-20 International system of electrode placement. Electrical activity was acquired at a sampling rate of 500Hz  and reviewed with a high frequency filter of 70Hz  and a low frequency filter of 1Hz . EEG data were recorded continuously and digitally stored. Description: The posterior dominant rhythm consists of 9-10 Hz activity of moderate  voltage (25-35 uV) seen predominantly in posterior head regions, symmetric and reactive to eye opening and eye closing.  There is also 15 to 18 Hz beta activity  distributed symmetrically and diffusely.  Physiologic photic driving was seen during photic stimulation.  Hyperventilation was not performed. IMPRESSION: This study is within normal limits. No seizures or epileptiform discharges were seen throughout the recording. Lora Havens   DG Chest 2 View  Result Date: 10/25/2019 CLINICAL DATA:  Cough.  Right-sided chest pain. Shortness of breath. EXAM: CHEST - 2 VIEW COMPARISON:  Radiograph CT 01/08/2020. Chest CT 10/17/2019 FINDINGS: Progressive volume loss in the left hemithorax with progressive opacification throughout, patient with known left hilar mass. This is likely increasing postobstructive atelectasis throughout the left lung. Difficult to exclude an element of pleural effusion. Right lung is slightly hyperinflated but clear. Cardiomediastinal contours are obscured by left lung opacity. No evidence of right pulmonary edema. No visualized pneumothorax. IMPRESSION: Progressive volume loss in the left hemithorax with increased opacification, likely increasing postobstructive atelectasis throughout the left lung given history of left hilar mass and upper lobe collapse on recent CT. Difficult to exclude an element of pleural fluid on the left. Electronically Signed   By: Keith Rake M.D.   On: 10/25/2019 19:26   CT HEAD WO CONTRAST  Result Date: 10/25/2019 CLINICAL DATA:  Slurred speech, history of CVAs, neuro deficit, acute, stroke suspected. EXAM: CT HEAD WITHOUT CONTRAST TECHNIQUE: Contiguous axial images were obtained from the base of the skull through the vertex without intravenous contrast. COMPARISON:  Brain MRI 10/17/2019, head CT 10/02/2019. FINDINGS: Brain: There is no evidence of acute intracranial hemorrhage, intracranial mass, midline shift or extra-axial fluid collection.No demarcated cortical infarction. Redemonstrated chronic lacunar infarcts within the bilateral corona radiata/basal ganglia. Background ill-defined hypoattenuation within the cerebral white matter is nonspecific, but consistent with chronic small vessel ischemic disease. Stable, mild generalized parenchymal atrophy. Vascular: No hyperdense vessel.  Atherosclerotic calcifications. Skull: Normal. Negative for fracture or focal lesion. Sinuses/Orbits: Visualized orbits demonstrate no acute abnormality. No significant  paranasal sinus disease or mastoid effusion at the imaged levels. IMPRESSION: No evidence of acute intracranial abnormality. Redemonstrated chronic bilateral corona radiata/basal ganglia lacunar infarcts. Stable background generalized parenchymal atrophy and chronic small vessel ischemic disease. Electronically Signed   By: Kellie Simmering DO   On: 10/25/2019 18:33   CT Head Wo Contrast  Result Date: 10/12/2019 CLINICAL DATA:  Altered level of consciousness, seizure EXAM: CT HEAD WITHOUT CONTRAST TECHNIQUE: Contiguous axial images were obtained from the base of the skull through the vertex without intravenous contrast. COMPARISON:  06/05/2019, 06/06/2019 FINDINGS: Brain: There are chronic small vessel ischemic changes seen within the bilateral basal ganglia and periventricular white matter. No acute infarct or hemorrhage. Lateral ventricles and remaining midline structures are unremarkable. No acute extra-axial fluid collections. No mass effect. Vascular: No hyperdense vessel or unexpected calcification. Skull: Normal. Negative for fracture or focal lesion. Sinuses/Orbits: No acute finding. Other: None IMPRESSION: 1. Chronic small vessel ischemic changes, no acute intracranial process. Electronically Signed   By: Randa Ngo M.D.   On: 10/17/2019 22:54   CT CHEST W CONTRAST  Result Date: 10/17/2019 CLINICAL DATA:  Abnormal chest x-ray, pulmonary nodule, seizure, altered level of consciousness EXAM: CT CHEST WITH CONTRAST TECHNIQUE: Multidetector CT imaging of the chest was performed during intravenous contrast administration. CONTRAST:  53mL OMNIPAQUE IOHEXOL 300 MG/ML  SOLN COMPARISON:  09/22/2019, 03/04/2019 FINDINGS: Cardiovascular: The heart is unremarkable without pericardial effusion. Thoracic aorta is normal in  caliber without aneurysm or dissection. No filling defects or pulmonary emboli. Extrinsic compression upon the left pulmonary artery due to left hilar mass. Moderate atherosclerosis of the  coronary vasculature, with mild calcified plaque of the aortic arch. Mediastinum/Nodes: There is a lobular soft tissue mass in the mediastinum and AP window, measuring 4.7 x 5.3 cm. Large left hilar mass with obstruction of the left upper lobe bronchus. No contralateral hilar adenopathy. There is circumferential wall thickening of the distal thoracic esophagus, nonspecific. Endoscopy may be useful. Lungs/Pleura: There is complete collapse of the left upper lobe due to a large left hilar mass causing abrupt cut off of the left upper lobe bronchus. Distinguishing the left hilar mass from the consolidated left upper lobe is difficult. PET scan recommended for further evaluation. Background emphysema. Minimal tree in bud ground-glass airspace disease within the left lower lobe likely inflammatory or infectious. There is narrowing of the left lower lobe bronchus with mucoid material filling the posterior basilar segmental bronchi. No effusion or pneumothorax. Upper Abdomen: Thickening of the left adrenal gland measuring 12 mm, suspicious for metastases. Remainder of the upper abdomen is unremarkable. Musculoskeletal: No acute or destructive bony lesions. Reconstructed images demonstrate no additional findings. IMPRESSION: 1. 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 This mass results in extrinsic compression of the left upper lobe pulmonary artery, as well as complete obstruction of the left upper lobe bronchus and complete left upper lobe atelectasis. 2. Circumferential mural thickening of the distal thoracic esophagus. Endoscopy recommended for further evaluation. 3. Minimal left lower lobe tree-in-bud ground-glass airspace disease consistent with inflammation or infection. 4. Left adrenal thickening, nonspecific. Metastatic disease not excluded. 5.  Emphysema (ICD10-J43.9). Electronically Signed   By: Randa Ngo M.D.   On: 10/17/2019 02:01   MR BRAIN W WO  CONTRAST  Result Date: 10/17/2019 CLINICAL DATA:  Seizure. Abnormal neuro exam. Hypertension. Patient stopped drinking daily alcohol 1 week ago. Confusion. EXAM: MRI HEAD WITHOUT AND WITH CONTRAST TECHNIQUE: Multiplanar, multiecho pulse sequences of the brain and surrounding structures were obtained without and with intravenous contrast. CONTRAST:  61mL GADAVIST GADOBUTROL 1 MMOL/ML IV SOLN COMPARISON:  CT head without contrast 09/27/2019. MRI of the head 04/19/2019 FINDINGS: Brain: The diffusion-weighted images demonstrate no acute or subacute infarct. Remote lacunar infarcts are present within the basal ganglia extending superiorly into the corona radiata bilaterally. Mild atrophy and white matter disease is present bilaterally, otherwise stable. The basal ganglia are otherwise unremarkable. Insular ribbon is within limits bilaterally. Study is mildly degraded by patient motion. Temporal lobes are normal. The internal auditory canals are normal bilaterally. The brainstem and cerebellum are normal. Postcontrast images demonstrate no pathologic enhancement. Vascular: Flow is present in the major intracranial arteries. Skull and upper cervical spine: The craniocervical junction is normal. Upper cervical spine is within normal limits. Marrow signal is unremarkable. Sinuses/Orbits: The paranasal sinuses and mastoid air cells are clear. The globes and orbits are within normal limits. IMPRESSION: 1. Remote nonhemorrhagic lacunar infarcts of the basal ganglia bilaterally extending into the corona radiata. 2. Atrophy and white matter disease are mildly advanced for age. 3. No acute or focal intracranial abnormality to explain seizures or confusion. Electronically Signed   By: San Morelle M.D.   On: 10/17/2019 05:30   CT ABDOMEN PELVIS W CONTRAST  Result Date: 10/22/2019 CLINICAL DATA:  64 year old male with history of adrenal mass suspicious for malignancy. Follow-up study. EXAM: CT ABDOMEN AND PELVIS WITH  CONTRAST TECHNIQUE: Multidetector  CT imaging of the abdomen and pelvis was performed using the standard protocol following bolus administration of intravenous contrast. CONTRAST:  123mL OMNIPAQUE IOHEXOL 300 MG/ML  SOLN COMPARISON:  No priors. FINDINGS: Lower chest: Atherosclerotic calcifications in the left circumflex and right coronary arteries. Hepatobiliary: Multiple ill-defined hypovascular lesions are scattered throughout the hepatic parenchyma, highly concerning for widespread metastatic disease. One specific example is the lesion at the junction of segment 8 and the caudate lobe (axial image 19 of series 3) measuring 3.2 x 2.1 cm. No intra or extrahepatic biliary ductal dilatation. Gallbladder is nearly completely collapsed with avid enhancement of the gallbladder mucosa and diffuse wall edema, without surrounding inflammatory changes. Pancreas: No pancreatic mass. No pancreatic ductal dilatation. No pancreatic or peripancreatic fluid collections or inflammatory changes. Spleen: Unremarkable. Adrenals/Urinary Tract: Subcentimeter low-attenuation lesions in both kidneys, too small to characterize, but statistically likely to represent cysts. No hydroureteronephrosis. Urinary bladder is normal in appearance. Right adrenal gland is normal. 1.2 cm left adrenal nodule (axial image 23 of series 3), similar to the prior chest CT 10/06/2019. Stomach/Bowel: Normal appearance of the stomach. No pathologic dilatation of small bowel or colon. Numerous colonic diverticulae are noted, without surrounding inflammatory changes to suggest an acute diverticulitis at this time. Normal appendix. Vascular/Lymphatic: Aortic atherosclerosis, without evidence of aneurysm or dissection in the abdominal or pelvic vasculature. No lymphadenopathy noted in the abdomen or pelvis. Reproductive: Prostate gland and seminal vesicles are unremarkable in appearance. Other: No significant volume of ascites.  No pneumoperitoneum.  Musculoskeletal: There are no aggressive appearing lytic or blastic lesions noted in the visualized portions of the skeleton. IMPRESSION: 1. Small left adrenal nodule is nonspecific. This could either be a benign or malignant lesion. 2. Importantly, there are innumerable hypovascular areas in the liver which are highly suspicious for metastatic disease. This could be better evaluated with follow-up abdominal MRI with and without IV gadolinium. 3. Colonic diverticulosis without evidence of acute diverticulitis at this time. 4. Additional incidental findings, as above. Electronically Signed   By: Vinnie Langton M.D.   On: 10/22/2019 16:40   MR LIVER W WO CONTRAST  Result Date: 10/24/2019 CLINICAL DATA:  Indeterminate liver lesions on recent CT. Recently diagnosed lung carcinoma. EXAM: MRI ABDOMEN WITHOUT AND WITH CONTRAST TECHNIQUE: Multiplanar multisequence MR imaging of the abdomen was performed both before and after the administration of intravenous contrast. CONTRAST:  53mL GADAVIST GADOBUTROL 1 MMOL/ML IV SOLN COMPARISON:  CT on 10/22/2019 FINDINGS: Lower chest: No acute findings. Hepatobiliary: Innumerable small hypovascular masses are seen throughout the liver, consistent with diffuse liver metastases. Gallbladder is nearly completely collapsed. No evidence of biliary ductal dilatation. Pancreas:  No mass or inflammatory changes. Spleen:  Within normal limits in size and appearance. Adrenals/Urinary Tract: No masses identified. Small bilateral renal cysts noted. No evidence of hydronephrosis. Stomach/Bowel: Visualized portion unremarkable. Vascular/Lymphatic: Mild lymphadenopathy in porta hepatis measuring 1.9 cm short axis. No abdominal aortic aneurysm. Other:  None. Musculoskeletal: Diffuse enhancing T2 hyperintense bone lesions throughout the visualized portion of the thoracolumbar spine and pelvis, consistent with diffuse bone metastases. IMPRESSION: Diffuse liver metastases. Diffuse bone metastases.  Mild abdominal lymphadenopathy in porta hepatis, consistent with metastatic disease. Electronically Signed   By: Marlaine Hind M.D.   On: 10/24/2019 19:23   DG Chest Portable 1 View  Result Date: 10/06/2019 CLINICAL DATA:  Seizure, altered mental status EXAM: PORTABLE CHEST 1 VIEW COMPARISON:  06/06/2019 FINDINGS: Right lung is grossly clear. Asymmetric hazy opacification in the left thorax with increased  left hilar density. Heart size upper limits of normal. No pneumothorax. IMPRESSION: Asymmetric hazy opacity in left thorax with asymmetric density in the left hilus, could be due to diffuse lung parenchymal air disease versus lung mass. Chest CT is recommended for further evaluation. Electronically Signed   By: Donavan Foil M.D.   On: 10/09/2019 22:42    ASSESSMENT AND PLAN: Small cell lung cancer -Left hilar mass with mediastinal lymph hilar adenopathy, extrinsic compression of the left upper lobe pulmonary artery and complete obstruction of the left upper lobe bronchus noted on CT scan of the chest on 10/17/2019 -MRI of the brain did not show evidence of metastases on 10/17/2019 -Status post EBUS on 10/25/2019 -pathology consistent with small cell lung cancer -CT of the abdomen pelvis on 10/20/2019 with a nonspecific left adrenal nodule and innumerable lesions in the liver suspicious for metastatic disease -I have again discussed the diagnosis with the patient who seems to have limited understanding of this conversation.  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  Treatment for this is he would not be curative but will be given with palliative intent. -His performance status is marginal, ECOG performance status of 3 at best -Additionally, he continues to be encephalopathic which could represent a paraneoplastic process. -Has been started on palliative radiation. -I have placed a call to his brother, [name], and discussed his diagnosis and prognosis.  He understands that his cancer is  not curable.  We also discussed concerns about proceeding with chemotherapy including the fact that the patient does not have a clear understanding of his diagnosis and treatment recommendations as well as concern about pulling out his IV line such as a PICC line, and concern for an infected PICC line due to the fact the patient was digging in his stool earlier this morning.  -The patient's brother has expressed that he understands that this is not a curable disease but would like to attempt aggressive treatment including chemotherapy. -I will discuss this further with his medical oncologist to determine the timing of possible chemotherapy and whether we administer this as an inpatient versus outpatient basis. -Recommend SNF for rehabilitation.  Generalized tonic-clonic seizures/encephalopathy -He has not had any recurrent seizures and mental status slowly improving -He has been started on lactulose for persistently elevated ammonia level -Neurology has previously evaluated and has now signed off  Hypokalemia, hypomagnesemia, and hypophosphatemia, improving -Replete per hospitalist  Elevated LFTs -LFTs remain elevated, ? Due to liver lesions versus medications -Monitor for now  Leukocytosis -He remains afebrile with negative cultures -Likely due to prednisone  Hypertension and history of CVA -Continue aggressive management of his hypertension -Remains on aspirin 81 mg daily  Rheumatoid arthritis -The patient has been maintained on high-dose prednisone for many years -He has been on Plaquenil and Enbrel in the past which are now on hold -He is currently on prednisone 40 mg daily  History of alcohol and cocaine use -The patient reports that he is not drinking all alcohol and urine drug screen on admission was negative  Tobacco dependence -Smoking cessation was encouraged    LOS: 10 days   Mikey Bussing, DNP, AGPCNP-BC, AOCNP 10/27/19

## 2019-10-27 NOTE — Progress Notes (Signed)
Patient placed on bedpan prior to shift change and continued to refuse to have bedpan removed. During shift report while patient on bedpan, patient's hands were noted to be covered in feces. Patient still refused to be removed from the bedpan or to be cleaned. RNs made nursing staff aware. Oncoming RN will continue to monitor patient and will attempt to offer hygiene and toileting (bedpan) assistance. Call bell within reach and bed in lowest position with bed alarm in use.

## 2019-10-27 NOTE — Progress Notes (Addendum)
Chaplain follow up with pt in order to notarize Advance Directive.     Drew Nguyen expressed frustration around his hospitalization - feeling that he "is not [place]" and continually expressed desire to discharge home during our conversation.  Chaplain attempted to discuss Advance Directive with pt - in order to assess competency for notarization.  Drew Nguyen states he "is not ready to die," and continues to divert back to speaking about discharge.  He is not able to speak about Advance Directive during this visit.     Provided support around Drew Nguyen' frustration.  He feels that he is not "being rehabbed" at [facility] and feels that he is "going to die here"    Jerene Pitch, MDiv, [facility]

## 2019-10-27 NOTE — Progress Notes (Signed)
Patient removed telemetry leads, patient educated about need for telemetry monitor. Patient refuses to be placed back on telemetry at this time. On-call provider notified via [place].  RN will continue to monitor, patient call bell within reach and bed in lowest position with bed alarm in use.

## 2019-10-27 NOTE — Progress Notes (Signed)
Daily Progress Note   Patient Name: Drew Nguyen       Date: 10/27/2019 DOB: Jun 03, 1956  Age: 64 y.o. MRN#: [id number] Attending Physician: Patrecia Pour, MD Primary Care Physician: Boyce Medici, FNP Admit Date: 10/18/2019  Reason for Consultation/Follow-up: Establishing goals of care and Psychosocial/spiritual support Symptom management  Subjective: Patient is awake alert sitting up in bed.  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  He denies shortness of breath.  He has a bedside fan available.  He has intermittently garbled speech.  He is eager to begin radiation treatments.  He states that his goal is to become strong.      Assessment: Generalized weakness Shortness of breath at times Frailty/deconditioning Functional decline   Patient Profile/HPI:  64 y.o. male  with past medical history of CVA x 2, rheumatoid arthritis, COPD with on-going tobacco use, depression, and poly substance abuse who was admitted on 10/10/2019 with tonic clonic seizure presumably brought on by alcohol withdraw.  During his work up a CT chest showed a large left sided lung mass and possible liver mets.  Bronchoscopy was performed and pathology showed small cell lung cancer.  The patient has also suffered with delirium intermittently thru out the hospitalization.    Length of Stay: 10  Current Medications: Scheduled Meds:  . amLODipine  10 mg Oral Daily  . carvedilol  12.5 mg Oral BID WC  . feeding supplement (ENSURE ENLIVE)  237 mL Oral BID BM  . isosorbide-hydrALAZINE  1 tablet Oral TID  . lactulose  30 g Oral BID  . lidocaine  15 mL Mouth/Throat TID AC  . losartan  50 mg Oral Daily  . mometasone-formoterol  2 puff Inhalation BID  . nystatin  5 mL Oral QID  . pantoprazole  40 mg Oral BID    . potassium & sodium phosphates  1 packet Oral TID WC & HS  . potassium chloride  40 mEq Oral TID  . predniSONE  20 mg Oral QAC breakfast  . risperiDONE  1 mg Oral QHS  . senna-docusate  1 tablet Oral QHS  . traZODone  50 mg Oral QHS  . umeclidinium bromide  1 puff Inhalation Daily    Continuous Infusions:   PRN Meds: acetaminophen **OR** acetaminophen, guaiFENesin-dextromethorphan, hydrALAZINE, LORazepam, [EXPIRED] LORazepam **FOLLOWED  BY** [EXPIRED] LORazepam **FOLLOWED BY** [EXPIRED] LORazepam **FOLLOWED BY** LORazepam, morphine CONCENTRATE, ondansetron **OR** ondansetron (ZOFRAN) IV, phenol, polyvinyl alcohol, sodium chloride  Physical Exam        Chronically ill appearing pleasant male, A&O, speech intermittently garbled. Regular work of breathing No edema Abdomen is not distended    Vital Signs: BP (!) 147/77 (BP Location: Right Arm)   Pulse 83   Temp 97.9 F (36.6 C) (Oral)   Resp 18   Ht 5\' 6"  (1.676 m)   Wt 70 kg   SpO2 92%   BMI 24.91 kg/m  SpO2: SpO2: 92 % O2 Device: O2 Device: Room Air O2 Flow Rate: O2 Flow Rate (L/min): 2 L/min  Intake/output summary:   Intake/Output Summary (Last 24 hours) at 10/27/2019 1244 Last data filed at 10/27/2019 4268 Gross per 24 hour  Intake 236 ml  Output 1800 ml  Net -1564 ml   LBM: Last BM Date: 10/22/19 Baseline Weight: Weight: 70 kg Most recent weight: Weight: 70 kg       Palliative Assessment/Data: 40%      Patient Active Problem List   Diagnosis Date Noted  . Lesion of liver greater than 1 cm in diameter with history of malignant neoplasm 10/24/2019  . Abnormal CT scan, esophagus   . Small cell lung cancer in adult [facility])   . Palliative care encounter   . Small cell lung cancer, left ([place])   . Hilar adenopathy   . Seizure ([place]) 10/17/2019  . Hyponatremia 10/17/2019  . Hypokalemia   . Acute ischemic stroke ([place]) 06/06/2019  . 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 Stroke ([name]) 06/06/2019  . CVA  (cerebral vascular accident) ([place]) 04/19/2019  . Essential hypertension 04/19/2019  . Cocaine abuse ([place]) 04/19/2019  . Adjustment disorder 04/19/2019  . Dysphagia 10/25/2018  . Tobacco abuse 09/13/2018  . COPD GOLD I, still smoking 09/13/2018  . Rheumatoid arthritis ([place]) 09/13/2018    Palliative Care Plan    Recommendations/Plan:  Has morphine SL PRN chest pain, SOB  Fan at bedside for sensation of dyspnea   chaplain following to notarize HCPOA documentation  PMT will continue to follow intermittently.  Goals of Care and Additional Recommendations:  Limitations on Scope of Treatment: Full Scope Treatment  Code Status:  Full code   Prognosis:   widely metastatic small cell lung cancer in the setting of a deconditioned gentleman with significant RA.  Even with treatment I'm concerned his prognosis is poor.  6 months or less.  Discharge Planning:  To Be Determined  Care plan was discussed with Bedside RN  Thank you for allowing the Palliative Medicine Team to assist in the care of this patient.  Total time spent:  25 min.     Greater than 50%  of this time was spent counseling and coordinating care related to the above assessment and plan.  Loistine Chance MD.  Palliative Medicine  Please contact Palliative MedicineTeam phone at [phone] for questions and concerns between 7 am - 7 pm.   Please see AMION for individual provider pager numbers.

## 2019-10-27 NOTE — Progress Notes (Signed)
  Radiation Oncology         [phone] ________________________________  Name: Drew Nguyen MRN: [id number]  Date: 04/19/2019  DOB: 02/03/1956  Chart Note:  I reviewed this patient's most recent findings and wanted to take a minute to document my impression.  His abdominal MRI on 3/5 confirmed diffuse liver and bone metastases, consistent with extensive stage (IV) small cell lung cancer.  Impression:  In light of this information, his thoracic radiotherapy will be given with palliative intent to preserve is left sided airways  Plan:  At this point, the patient will be switched to a shorter radiation fractionation scheme supplementing his initial fraction of 1.8 Gy with 10 fractions of 3 Gy to 31.8 Gy in 11 fractions total.  ________________________________  Sheral Apley Tammi Klippel, M.D.

## 2019-10-27 NOTE — TOC Progression Note (Signed)
Transition of Care [facility]) - Progression Note    Patient Details  Name: FABRICIO ENDSLEY MRN: [id number] Date of Birth: [birth year]  [facility] CM/SW Contact  Joaquin Courts, RN Phone Number: 10/27/2019, 1:22 PM  Clinical Narrative:   CM spoke with patient regarding discharge planning. CM and patient discussed his plans once he is medically stable to leave the hospital. Patient shares that he has a caregiver who lives with him along with his young children and spouse.  The caregiver lives with patient free of charge and in return helps care for him and his children.  Patient reports that prior to coming to the hospital patient was able to ambulate around the home, but now reports he is so weak he is unable to get out of bed by himself.  Patient reports he is open to exploring SNF options but is adamant that he does not want any facilities that have Covid patients. CM had a frank and honest discussion with patient regarding barriers to placement.  CM explained that patient's SNF options are limited due to payor source(medicaid) and that CM cannot guarantee that no Covid cases occur in any facility he may go to after his arrival. Additionally, CM discussed that patient would need to agree to stay for a minimum of 30 days and be prepared to sign his check over to facility for the duration of his stay.  Patient expresses that he is agreeable to this and wishes to pursue SNF options.  CM discussed that in the event no facility makes bed offers, patient will need to consider option to return home with [facility] services.   CM faxed out FL2 to area facilities.         Expected Discharge Plan: (Home with [facility] v SNF) Barriers to Discharge: Other (comment)(Finding either facility or [facility] provider willing to work with [facility] and oncological needs)  Expected Discharge Plan and Services Expected Discharge Plan: (Home with [facility] v SNF)   Discharge Planning Services: CM Consult Post Acute Care Choice: Durable  Medical Equipment, Home Health Living arrangements for the past 2 months: Single Family Home                                       Social Determinants of Health (SDOH) Interventions    Readmission Risk Interventions Readmission Risk Prevention Plan 10/27/2019  Transportation Screening Complete  Medication Review [employer]) Complete  SW Recovery Care/Counseling Consult Complete  Palliative Care Screening Complete  Skilled Nursing Facility Complete  Some recent data might be hidden

## 2019-10-27 NOTE — Progress Notes (Signed)
PROGRESS NOTE  Drew Nguyen  VZD:[id number] DOB: 1955-11-28 DOA: 10/10/2019 PCP: Boyce Medici, FNP   Brief Narrative: Drew Nguyen is a 63 y.o. 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 MRI brain showed remote lacunar infarcts without acute abnormality. Later EEG was reportedly normal. CXR was abnormal and subsequent CT chest demonstrated a hilar mass with associated adenopathy and compression of left upper lobe bronchus and pulmonary artery with distal atelectasis. This also showed distal esophageal thickening and nonspecific left adrenal thickening. 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 CT abdomen/pelvis performed to follow up left adrenal findings revealed a nonspecific nodule with innumerable hypovascular areas in the liver confirmed on subsequent MRI, which also showed diffuse enhancing T2 hyperintense bone lesions throughout the visualized portion of the thoracolumbar spine and pelvis, consistent with diffuse bone metastases.  Assessment & Plan: Principal Problem:   Seizure ([place]) Active Problems:   COPD GOLD I, still smoking   Rheumatoid arthritis ([place])   CVA (cerebral vascular accident) ([place])   Essential hypertension   Hyponatremia   Small cell lung cancer, left ([place])   Hilar adenopathy   Abnormal CT scan, esophagus   Small cell lung cancer in adult [facility])   Palliative care encounter  Small cell lung CA of LUL with metastases to liver and bone: No brain mass on initial MRI. - Appreciate oncology consultation, will discuss initiation of systemic therapy. - Rad-onc recommended transfer to [facility] to undergo inpatient radiation therapy which has been  initiated. - Palliative care team also consulted.  Hepatic metastatic disease with hepatic encephalopathy: With LFT elevation, normal bilirubin. INR 1.0 indicating preserved synthetic function. Hepatitis panel negative.  - Continue lactulose, seems to have improved mentation, though not at goal stools, increased dose as below. - Delirium precautions. Patient's cognition has improved but now intermittently agitated. - LFTs stable overall.  LUL, postobstructive CAP: Completed doxycycline 2/26 - 3/2.  - With progressive volume loss of left lung on CXR 3/8.   Bleeding at lovenox injection site: Hgb trended down.  - Hold pressure, DC'ed antiplatelets, anticoagulation. Risk/benefit of this was discussed with patient and brother at bedside.  - Sidelined general surgery, no great options so will attempt conservative treatment for now. - Monitor site, keep pressure bandage, trend CBC. - INR 1.0  Hyponatremia: POA, possibly beer potomania, currently resolved.   Generalized tonic-clonic seizures/acute toxic metabolic encephalopathy (presenting symptom): No brain metastases, confirmed remote lacunar infarcts in bilateral basal ganglia and corona radiata, atrophy, EEG was within normal limits.  - Neurology has signed off   Hypokalemia: Severe, only somewhat improved with aggressive supplementation - Change to solution of potassium and check in AM. Supp mag for assist.  History of recurrent CVA:  - Hold plavix (held around time of biopsy), statin not indicated due to elevated LFTs and LDL of 53  Slurred speech: Multifactorial most likely due to encephalopathy/possibly paraneoplastic, dry mouth causing dysarthria. Subtle right-sided lower facial weakness is only focal deficit on exam. CT head is without acute stroke.  - Continue augmented lactulose Tx.  - Supportive care otherwise. If continues and/or waxes/wanes, consider changing risperdal and other psychoactive medications.  Hypophosphatemia:   - Improved with  supplementation  Hypertension with hypertensiveurgency: Shile no longer severe, will need improved control.  - Continue norvasc 10mg  - Increased losartan which may help hypokalemia.  - Started coreg, had short run of PSVT 3/6 improved with augmented dosing.  - Continue prn hydralazine.  - Continue newly added bidil  Right-sided chest pain: ECG unchanged without ischemic features, not exertional/typical. Resolved.  Rheumatoid arthritis on chronic immunosuppression:  - Tapering down from stress-dose steroids, now down to prednisone 20mg . Baseline prednisone of late said to be 15mg .  - Holding plaquenil and enbrel  Polysubstance abuse, alcohol abuse, tobacco use:  - Cessation counseling provided.  - Monitor for evidence of withdrawal, though now outside window for this.  COPD: No exacerbation.  - Continue bronchodilators.   Esophageal thickening:  - Appreciate GI consultation, giving PPI BID, nystatin for possibility of thrush, and viscous lidocaine. Will change oral medications to solution form inasmuch as that is possible  Depression: Continue Celexa and Risperdal  FTT, muscular deconditioning:  - Continue PT/OT and SNF recommendations.  D/w pt again today. - Supplement protein with hypoalbuminemia. Ensure.   DVT prophylaxis: SCDs Code Status: Full Family Communication: None at bedside this AM. Brother later in the day is present. Disposition Plan: Patient from home, continues to have multiple severe medical problems undergoing active treatment. SNF recommended, though patient reluctant.   Consultants:   Oncology  Neurology  PCCM  Radiation oncology  Palliative care  GI  Procedures:   EEG Flexible video fiberoptic bronchoscopy with endobronchial ultrasound and biopsies on March 2 by Dr. Shearon Stalls  Antimicrobials:  Doxycycline 2/26 - 3/2   Subjective: Wants PT to get him out of bed, wants to work on getting stronger as much as possible.  Also wants to shave the area where the bandage is on the abdomen. No current shortness of breath, though this is improved with the fan when it does come up. No chest pain.   Objective: Vitals:   10/26/19 1428 10/26/19 2003 10/26/19 2202 10/27/19 0409  BP: 120/72  (!) 153/80 (!) 147/77  Pulse: 79  75 83  Resp: 18  19 18   Temp: (!) 97.3 F (36.3 C)  97.9 F (36.6 C) 97.9 F (36.6 C)  TempSrc:   Oral Oral  SpO2: 93% 90% 91% 92%  Weight:      Height:        Intake/Output Summary (Last 24 hours) at 10/27/2019 1450 Last data filed at 10/27/2019 0258 Gross per 24 hour  Intake 236 ml  Output 1800 ml  Net -1564 ml   Filed Weights   10/15/2019 2057 11/09/2019 1126  Weight: 70 kg 70 kg   Gen: 64 y.o. male in no distress Pulm: Nonlabored breathing room air. Clear but diminished on left. CV: Regular rate and rhythm. No murmur, rub, or gallop. No JVD, no significant dependent edema. GI: Abdomen soft, non-tender, non-distended, with normoactive bowel sounds.  Ext: Warm, no deformities Skin: No new rashes, lesions or ulcers on visualized skin. Right abdominal ecchymosis under dressing which is c/d/i with no gross bleeding. Neuro: Alert and oriented. No asterixis. Continued slurred speech but no new focal neurological deficits. Psych: Judgement and insight appear borderline. Mood euthymic & affect congruent. Behavior is appropriate.    Data Reviewed: I have personally reviewed following labs and imaging studies  CBC: Recent Labs  Lab 10/23/19 1116 10/24/19 0811 10/25/19 0545 10/26/19 0548 10/27/19 0557  WBC 17.1* 15.8* 14.2* 14.2* 14.0*  NEUTROABS 15.0* 13.2* 12.0*  --   --  HGB 13.3 12.5* 12.5* 12.3* 10.8*  HCT 38.9* 36.7* 37.6* 37.8* 32.9*  MCV 92.0 93.6 95.7 96.2 96.2  PLT 208 189 166 159 272   Basic Metabolic Panel: Recent Labs  Lab 10/29/2019 1603 10/22/19 0959 10/23/19 1116 10/24/19 0811 10/25/19 0545 10/26/19 0548 10/27/19 0557  NA   < >  --  143 141 140 141 141  K   <  >  --  3.5 3.0* 2.4* 2.9* 3.5  CL   < >  --  107 102 103 103 105  CO2   < >  --  22 25 27 26 27   GLUCOSE   < >  --  188* 165* 153* 228* 162*  BUN   < >  --  11 13 17  24* 25*  CREATININE   < >  --  0.62 0.73 0.52* 0.61 0.55*  CALCIUM   < >  --  8.3* 8.2* 8.2* 8.6* 8.2*  MG   < > 1.6* 1.5* 1.8 1.8 1.8 2.1  PHOS  --  1.5* 1.5* 2.5 2.7  --   --    < > = values in this interval not displayed.   GFR: Estimated Creatinine Clearance: 84.2 mL/min (A) (by C-G formula based on SCr of 0.55 mg/dL (L)). Liver Function Tests: Recent Labs  Lab 10/23/19 1116 10/24/19 0811 10/25/19 0545 10/26/19 0548 10/27/19 0557  AST 290* 207* 167* 104* 113*  ALT 521* 500* 466* 375* 319*  ALKPHOS 319* 363* 361* 376* 360*  BILITOT 1.1 1.0 1.0 0.8 1.0  PROT 5.8* 5.6* 6.0* 5.8* 5.7*  ALBUMIN 3.3* 3.0* 3.2* 3.0* 2.9*   No results for input(s): LIPASE, AMYLASE in the last 168 hours. Recent Labs  Lab 11/06/2019 1603 10/23/19 1116 10/24/19 0811  AMMONIA 66* 67* 52*   Coagulation Profile: Recent Labs  Lab 10/26/19 0548  INR 1.0   Cardiac Enzymes: No results for input(s): CKTOTAL, CKMB, CKMBINDEX, TROPONINI in the last 168 hours. BNP (last 3 results) No results for input(s): PROBNP in the last 8760 hours. HbA1C: No results for input(s): HGBA1C in the last 72 hours. CBG: No results for input(s): GLUCAP in the last 168 hours. Lipid Profile: No results for input(s): CHOL, HDL, LDLCALC, TRIG, CHOLHDL, LDLDIRECT in the last 72 hours. Thyroid Function Tests: No results for input(s): TSH, T4TOTAL, FREET4, T3FREE, THYROIDAB in the last 72 hours. Anemia Panel: No results for input(s): VITAMINB12, FOLATE, FERRITIN, TIBC, IRON, RETICCTPCT in the last 72 hours. Urine analysis:    Component Value Date/Time   COLORURINE YELLOW 09/25/2019 2238   [place] 10/07/2019 2238   LABSPEC 1.013 10/08/2019 2238   PHURINE 7.0 10/15/2019 2238   GLUCOSEU NEGATIVE 10/03/2019 2238   HGBUR NEGATIVE 10/05/2019 2238    [place] 09/30/2019 2238   [place] 09/29/2019 2238   PROTEINUR 100 (A) 10/08/2019 2238   UROBILINOGEN 2.0 (H) 05/03/2010 2059   NITRITE NEGATIVE 10/06/2019 2238   LEUKOCYTESUR NEGATIVE 10/05/2019 2238   No results found for this or any previous visit (from the past 240 hour(s)).    Radiology Studies: DG Chest 2 View  Result Date: 10/25/2019 CLINICAL DATA:  Cough. Right-sided chest pain. Shortness of breath. EXAM: CHEST - 2 VIEW COMPARISON:  Radiograph CT 01/08/2020. Chest CT 10/17/2019 FINDINGS: Progressive volume loss in the left hemithorax with progressive opacification throughout, patient with known left hilar mass. This is likely increasing postobstructive atelectasis throughout the left lung. Difficult to exclude an element of pleural effusion. Right lung is slightly hyperinflated  but clear. Cardiomediastinal contours are obscured by left lung opacity. No evidence of right pulmonary edema. No visualized pneumothorax. IMPRESSION: Progressive volume loss in the left hemithorax with increased opacification, likely increasing postobstructive atelectasis throughout the left lung given history of left hilar mass and upper lobe collapse on recent CT. Difficult to exclude an element of pleural fluid on the left. Electronically Signed   By: Keith Rake M.D.   On: 10/25/2019 19:26   CT HEAD WO CONTRAST  Result Date: 10/25/2019 CLINICAL DATA:  Slurred speech, history of CVAs, neuro deficit, acute, stroke suspected. EXAM: CT HEAD WITHOUT CONTRAST TECHNIQUE: Contiguous axial images were obtained from the base of the skull through the vertex without intravenous contrast. COMPARISON:  Brain MRI 10/17/2019, head CT 10/18/2019. FINDINGS: Brain: There is no evidence of acute intracranial hemorrhage, intracranial mass, midline shift or extra-axial fluid collection.No demarcated cortical infarction. Redemonstrated chronic lacunar infarcts within the bilateral corona radiata/basal ganglia.  Background ill-defined hypoattenuation within the cerebral white matter is nonspecific, but consistent with chronic small vessel ischemic disease. Stable, mild generalized parenchymal atrophy. Vascular: No hyperdense vessel.  Atherosclerotic calcifications. Skull: Normal. Negative for fracture or focal lesion. Sinuses/Orbits: Visualized orbits demonstrate no acute abnormality. No significant paranasal sinus disease or mastoid effusion at the imaged levels. IMPRESSION: No evidence of acute intracranial abnormality. Redemonstrated chronic bilateral corona radiata/basal ganglia lacunar infarcts. Stable background generalized parenchymal atrophy and chronic small vessel ischemic disease. Electronically Signed   By: Kellie Simmering DO   On: 10/25/2019 18:33    Scheduled Meds: . amLODipine  10 mg Oral Daily  . carvedilol  12.5 mg Oral BID WC  . feeding supplement (ENSURE ENLIVE)  237 mL Oral BID BM  . isosorbide-hydrALAZINE  1 tablet Oral TID  . lactulose  30 g Oral BID  . lidocaine  15 mL Mouth/Throat TID AC  . losartan  50 mg Oral Daily  . mometasone-formoterol  2 puff Inhalation BID  . nystatin  5 mL Oral QID  . pantoprazole  40 mg Oral BID  . potassium & sodium phosphates  1 packet Oral TID WC & HS  . potassium chloride  40 mEq Oral TID  . predniSONE  20 mg Oral QAC breakfast  . risperiDONE  1 mg Oral QHS  . senna-docusate  1 tablet Oral QHS  . traZODone  50 mg Oral QHS  . umeclidinium bromide  1 puff Inhalation Daily   Continuous Infusions:   LOS: 10 days   Time spent: 35 minutes.  Patrecia Pour, MD [facility] [email or web address] 10/27/2019, 2:50 PM

## 2019-10-27 NOTE — Progress Notes (Signed)
Occupational Therapy Treatment Patient Details Name: Drew Nguyen MRN: [id number] DOB: Dec 26, 1955 Today's Date: 10/27/2019    History of present illness Pt is  an 63 y.o. male 4 white male HTN, COPD stage II-III, cocaine/EtOH abuse, depression, rheumatoid arthritis on recently started on Plaquenil, left corona radiata CVA 03/2019 with repeat stroke 06/06/2019,?  Vocal cord malignancy.  He presented to [facility] ED 10/17/2019 secondary generalized tonic-clonic seizures witnessed by home health aide.  Pt found to have L hilar mass that is advanced small cell lung CA.  Per oncology plan for SNF to strength before treatments.   OT comments  Pt agreed to OOB with OT and grooming activity.   Follow Up Recommendations  SNF;Supervision/Assistance - 24 hour    Equipment Recommendations  Other (comment)       Precautions / Restrictions Precautions Precautions: Fall       Mobility Bed Mobility Overal bed mobility: Needs Assistance Bed Mobility: Supine to Sit     Supine to sit: Max assist        Transfers Overall transfer level: Needs assistance   Transfers: Squat Pivot Transfers     Squat pivot transfers: Mod assist;From elevated surface     General transfer comment: bed to chair    Balance Overall balance assessment: Needs assistance Sitting-balance support: Bilateral upper extremity supported;Feet supported Sitting balance-Leahy Scale: Poor     Standing balance support: Bilateral upper extremity supported;During functional activity Standing balance-Leahy Scale: Poor                             ADL either performed or assessed with clinical judgement   ADL Overall ADL's : Needs assistance/impaired Eating/Feeding: Set up;Sitting   Grooming: Wash/dry face;Set up;Sitting;Wash/dry hands                   Toilet Transfer: Moderate assistance;Cueing for safety;Cueing for sequencing;Squat-pivot Toilet Transfer Details (indicate cue type and reason): bed to  chair- pt did not fully stand with OT                 Vision Baseline Vision/History: Wears glasses            Cognition Arousal/Alertness: Awake/alert Behavior During Therapy: WFL for tasks assessed/performed Overall Cognitive Status: No family/caregiver present to determine baseline cognitive functioning                                 General Comments: needed encouragement to participate                   Pertinent Vitals/ Pain       Pain Assessment: No/denies pain         Frequency  Min 2X/week        Progress Toward Goals  OT Goals(current goals can now be found in the care plan section)  Progress towards OT goals: Progressing toward goals     Plan Discharge plan remains appropriate       AM-PAC OT "6 Clicks" Daily Activity     Outcome Measure   Help from another person eating meals?: A Little Help from another person taking care of personal grooming?: A Little Help from another person toileting, which includes using toliet, bedpan, or urinal?: A Lot Help from another person bathing (including washing, rinsing, drying)?: A Lot Help from another person to put on and taking off regular upper body  clothing?: A Lot Help from another person to put on and taking off regular lower body clothing?: Total 6 Click Score: 13    End of Session    OT Visit Diagnosis: Unsteadiness on feet (R26.81);Muscle weakness (generalized) (M62.81)   Activity Tolerance Patient tolerated treatment well   Patient Left in chair;with call bell/phone within reach;with chair alarm set;with nursing/sitter in room   Nurse Communication Mobility status        Time: 9444-6190 OT Time Calculation (min): 25 min  Charges: OT General Charges $OT Visit: 1 Visit OT Treatments $Self Care/Home Management : 23-37 mins  Kari Baars, [place] [phone] Office- [phone]      Debra Calabretta, Edwena Felty D 10/27/2019, 5:57  PM

## 2019-10-28 ENCOUNTER — Inpatient Hospital Stay: Payer: Self-pay

## 2019-10-28 ENCOUNTER — Ambulatory Visit
Admit: 2019-10-28 | Discharge: 2019-10-28 | Disposition: A | Discharge status: Home / Self Care | Payer: Medicaid Other | Attending: Radiation Oncology | Admitting: Radiation Oncology

## 2019-10-28 ENCOUNTER — Other Ambulatory Visit: Payer: Self-pay | Admitting: Hematology

## 2019-10-28 DIAGNOSIS — G9340 Encephalopathy, unspecified: Secondary | ICD-10-CM

## 2019-10-28 LAB — COMPREHENSIVE METABOLIC PANEL
ALT: 349 U/L — ABNORMAL HIGH (ref 0–44)
AST: 155 U/L — ABNORMAL HIGH (ref 15–41)
Albumin: 3.1 g/dL — ABNORMAL LOW (ref 3.5–5.0)
Alkaline Phosphatase: 413 U/L — ABNORMAL HIGH (ref 38–126)
Anion gap: 11 (ref 5–15)
BUN: 26 mg/dL — ABNORMAL HIGH (ref 8–23)
CO2: 26 mmol/L (ref 22–32)
Calcium: 8 mg/dL — ABNORMAL LOW (ref 8.9–10.3)
Chloride: 103 mmol/L (ref 98–111)
Creatinine, Ser: 0.66 mg/dL (ref 0.61–1.24)
GFR calc Af Amer: >60 mL/min (ref >60)
GFR calc non Af Amer: >60 mL/min (ref >60)
Glucose, Bld: 160 mg/dL — ABNORMAL HIGH (ref 70–99)
Potassium: 3.2 mmol/L — ABNORMAL LOW (ref 3.5–5.1)
Sodium: 140 mmol/L (ref 135–145)
Total Bilirubin: 1.2 mg/dL (ref 0.3–1.2)
Total Protein: 5.8 g/dL — ABNORMAL LOW (ref 6.5–8.1)

## 2019-10-28 LAB — CBC
HCT: 33.2 % — ABNORMAL LOW (ref 39.0–52.0)
Hemoglobin: 11.1 g/dL — ABNORMAL LOW (ref 13.0–17.0)
MCH: 31.9 pg (ref 26.0–34.0)
MCHC: 33.4 g/dL (ref 30.0–36.0)
MCV: 95.4 fL (ref 80.0–100.0)
Platelets: 149 10*3/uL — ABNORMAL LOW (ref 150–400)
RBC: 3.48 MIL/uL — ABNORMAL LOW (ref 4.22–5.81)
RDW: 13.1 % (ref 11.5–15.5)
WBC: 13.6 10*3/uL — ABNORMAL HIGH (ref 4.0–10.5)
nRBC: 0.1 % (ref 0.0–0.2)

## 2019-10-28 LAB — MAGNESIUM: Magnesium: 2 mg/dL (ref 1.7–2.4)

## 2019-10-28 MED ORDER — SODIUM CHLORIDE 0.9% FLUSH: 10.0000 mL | INTRAVENOUS | Status: DC | PRN

## 2019-10-28 MED ORDER — CHLORHEXIDINE GLUCONATE CLOTH 2 % EX PADS
6.0000 | MEDICATED_PAD | Freq: Every day | CUTANEOUS | Status: DC
  Administered 2019-10-29 – 2019-11-07 (×9): 6 via TOPICAL

## 2019-10-28 MED ORDER — SODIUM CHLORIDE 0.9% FLUSH
10.0000 mL | Freq: Two times a day (BID) | INTRAVENOUS | Status: DC
  Administered 2019-10-28 – 2019-11-07 (×14): 10 mL

## 2019-10-28 NOTE — Progress Notes (Signed)
PROGRESS NOTE  SEWARD CORAN  ZOX:[id number] DOB: 18-Oct-1955 DOA: 09/30/2019 PCP: Boyce Medici, FNP   Brief Narrative: Drew Nguyen is a 64 y.o. 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 MRI brain showed remote lacunar infarcts without acute abnormality. Later EEG was reportedly normal. CXR was abnormal and subsequent CT chest demonstrated a hilar mass with associated adenopathy and compression of left upper lobe bronchus and pulmonary artery with distal atelectasis. This also showed distal esophageal thickening and nonspecific left adrenal thickening. 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 CT abdomen/pelvis performed to follow up left adrenal findings revealed a nonspecific nodule with innumerable hypovascular areas in the liver confirmed on subsequent MRI, which also showed diffuse enhancing T2 hyperintense bone lesions throughout the visualized portion of the thoracolumbar spine and pelvis, consistent with diffuse bone metastases.  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 - Appreciate oncology consultation, will initiate chemotherapy 3/10-3/12 as inpatient, transfer to [place] consulted, guiding radiation therapy  initiated as inpatient - Palliative care team also consulted.  Hepatic metastatic disease with hepatic encephalopathy: With LFT elevation, normal bilirubin. INR 1.0 indicating preserved synthetic function. Hepatitis panel negative.  - Continue lactulose at current dose. - Delirium precautions. Patient's cognition has improved - LFTs stable overall.  LUL, postobstructive CAP: Completed doxycycline 2/26 - 3/2.  - With progressive volume loss of left lung on CXR 3/8.   Bleeding at lovenox injection site: Hgb trended down but since stabilized.  - Has dressing without evidence of ongoing bleeding. DC'ed antiplatelets, anticoagulation. Risk/benefit of this was discussed with patient and brother at bedside.  - Sidelined general surgery, no great options so will attempt conservative treatment for now. - Monitor site, keep pressure bandage, trend CBC. - INR 1.0  Hyponatremia: POA, possibly beer potomania, currently resolved.   Generalized tonic-clonic seizures/acute toxic metabolic encephalopathy (presenting symptom): No brain metastases, confirmed remote lacunar infarcts in bilateral basal ganglia and corona radiata, atrophy, EEG was within normal limits.  - Neurology has signed off   Hypokalemia: Severe, only somewhat improved with aggressive supplementation - Change to solution of potassium and check in AM. Supp mag for assist.  History of recurrent CVA:  - Hold plavix (held around time of biopsy), statin not indicated due to elevated LFTs and LDL of 53  Slurred speech: Multifactorial most likely due to encephalopathy/possibly paraneoplastic, dry mouth causing dysarthria. Subtle right-sided lower facial weakness is only focal deficit on exam. CT head is without acute stroke.  - Continue augmented lactulose Tx.  - Supportive care otherwise. Seems improved today. If continues and/or waxes/wanes, consider changing risperdal and other psychoactive medications.  Hypophosphatemia:  -  Improved with supplementation  Hypertension with  hypertensiveurgency: Shile no longer severe, will need improved control.  - Continue norvasc 10mg  - Increased losartan which may help hypokalemia.  - Started coreg, had short run of PSVT 3/6 improved with augmented dosing.  - Continue prn hydralazine.  - Continue newly added bidil  Right-sided chest pain: ECG unchanged without ischemic features, not exertional/typical. Resolved.  Rheumatoid arthritis on chronic immunosuppression:  - Tapering down from stress-dose steroids, now down to prednisone 20mg . Baseline prednisone of late said to be 15mg .  - Holding plaquenil and enbrel  Polysubstance abuse, alcohol abuse, tobacco use:  - Cessation counseling provided.  - Monitor for evidence of withdrawal, though now outside window for this.  COPD: No exacerbation.  - Continue bronchodilators.   Esophageal thickening:  - Appreciate GI consultation, giving PPI BID, nystatin for possibility of thrush, and viscous lidocaine. Will change oral medications to solution form inasmuch as that is possible  Depression: Continue Celexa and Risperdal  FTT, muscular deconditioning:  - Continue PT/OT and SNF recommendations.  D/w pt again today. - Supplement protein with hypoalbuminemia. Ensure.   DVT prophylaxis: SCDs Code Status: Full Family Communication: None at bedside this AM.  Disposition Plan: Patient from home, continues to have multiple severe medical problems undergoing active treatment. SNF placement planned after chemotherapy.  Consultants:   Oncology  Neurology  PCCM  Radiation oncology  Palliative care  GI  Procedures:   EEG Flexible video fiberoptic bronchoscopy with endobronchial ultrasound and biopsies on March 2 by Dr. Shearon Stalls  Antimicrobials:  Doxycycline 2/26 - 3/2   Subjective: Much more clear cognitively, speech improved, denies currently any issues. Worked with PT, feeling generally better.    Objective: Vitals:   10/27/19 2116 10/27/19 2123 10/28/19 0450 10/28/19 0834  BP:  (!) 155/82 (!) 176/98   Pulse:  79 77   Resp:  18 18   Temp:  97.7 F (36.5 C) 97.7 F (36.5 C)   TempSrc:  Oral Oral   SpO2: 93% 94% 95% 90%  Weight:      Height:        Intake/Output Summary (Last 24 hours) at 10/28/2019 1641 Last data filed at 10/28/2019 1052 Gross per 24 hour  Intake --  Output 1275 ml  Net -1275 ml   Filed Weights   09/24/2019 2057 11/02/2019 1126  Weight: 70 kg 70 kg   Gen: Chronically ill-appearing male in no distress Pulm: Nonlabored breathing room air. Clear, diminished. CV: Regular rate and rhythm. No murmur, rub, or gallop. No JVD, trace dependent edema. GI: Abdomen soft, non-tender, non-distended, with normoactive bowel sounds.  Ext: Warm, no deformities Skin: No rashes, lesions or ulcers on visualized skin. Bandage on abdomen hasn't been changed today and has no evidence of blood. Neuro: Alert and oriented. No focal neurological deficits. Psych: Judgement and insight appear fair. Mood euthymic & affect congruent. Behavior is appropriate.    Data Reviewed: I have personally reviewed following labs and imaging studies  CBC: Recent Labs  Lab 10/23/19 1116 10/23/19 1116 10/24/19 0811 10/25/19 0545 10/26/19 0548 10/27/19 0557 10/28/19 0608  WBC 17.1*   < > 15.8* 14.2* 14.2* 14.0* 13.6*  NEUTROABS 15.0*  --  13.2* 12.0*  --   --   --   HGB 13.3   < > 12.5* 12.5* 12.3* 10.8* 11.1*  HCT 38.9*   < > 36.7* 37.6* 37.8* 32.9* 33.2*  MCV 92.0   < > 93.6 95.7 96.2 96.2 95.4  PLT 208   < > 189 166 159 153  149*   < > = values in this interval not displayed.   Basic Metabolic Panel: Recent Labs  Lab 10/22/19 0959 10/22/19 0959 10/23/19 1116 10/23/19 1116 10/24/19 0811 10/25/19 0545 10/26/19 0548 10/27/19 0557 10/28/19 0608  NA  --   --  143   < > 141 140 141 141 140  K  --   --  3.5   < > 3.0* 2.4* 2.9* 3.5 3.2*  CL  --   --  107   < > 102 103 103 105 103   CO2  --   --  22   < > 25 27 26 27 26   GLUCOSE  --   --  188*   < > 165* 153* 228* 162* 160*  BUN  --   --  11   < > 13 17 24* 25* 26*  CREATININE  --   --  0.62   < > 0.73 0.52* 0.61 0.55* 0.66  CALCIUM  --   --  8.3*   < > 8.2* 8.2* 8.6* 8.2* 8.0*  MG 1.6*   < > 1.5*   < > 1.8 1.8 1.8 2.1 2.0  PHOS 1.5*  --  1.5*  --  2.5 2.7  --   --   --    < > = values in this interval not displayed.   GFR: Estimated Creatinine Clearance: 84.2 mL/min (by C-G formula based on SCr of 0.66 mg/dL). Liver Function Tests: Recent Labs  Lab 10/24/19 0811 10/25/19 0545 10/26/19 0548 10/27/19 0557 10/28/19 0608  AST 207* 167* 104* 113* 155*  ALT 500* 466* 375* 319* 349*  ALKPHOS 363* 361* 376* 360* 413*  BILITOT 1.0 1.0 0.8 1.0 1.2  PROT 5.6* 6.0* 5.8* 5.7* 5.8*  ALBUMIN 3.0* 3.2* 3.0* 2.9* 3.1*   No results for input(s): LIPASE, AMYLASE in the last 168 hours. Recent Labs  Lab 10/23/19 1116 10/24/19 0811  AMMONIA 67* 52*   Coagulation Profile: Recent Labs  Lab 10/26/19 0548  INR 1.0   Cardiac Enzymes: No results for input(s): CKTOTAL, CKMB, CKMBINDEX, TROPONINI in the last 168 hours. BNP (last 3 results) No results for input(s): PROBNP in the last 8760 hours. HbA1C: No results for input(s): HGBA1C in the last 72 hours. CBG: No results for input(s): GLUCAP in the last 168 hours. Lipid Profile: No results for input(s): CHOL, HDL, LDLCALC, TRIG, CHOLHDL, LDLDIRECT in the last 72 hours. Thyroid Function Tests: No results for input(s): TSH, T4TOTAL, FREET4, T3FREE, THYROIDAB in the last 72 hours. Anemia Panel: No results for input(s): VITAMINB12, FOLATE, FERRITIN, TIBC, IRON, RETICCTPCT in the last 72 hours. Urine analysis:    Component Value Date/Time   COLORURINE YELLOW 10/08/2019 2238   [place] 10/14/2019 2238   LABSPEC 1.013 09/28/2019 2238   PHURINE 7.0 10/08/2019 2238   GLUCOSEU NEGATIVE 10/05/2019 2238   HGBUR NEGATIVE 09/28/2019 2238   [place]  10/09/2019 2238   [place] 10/10/2019 2238   PROTEINUR 100 (A) 10/09/2019 2238   UROBILINOGEN 2.0 (H) 05/03/2010 2059   NITRITE NEGATIVE 09/30/2019 2238   LEUKOCYTESUR NEGATIVE 10/09/2019 2238   No results found for this or any previous visit (from the past 240 hour(s)).    Radiology Studies: [place] EKG SITE RITE  Result Date: 10/28/2019 If Site Rite image not attached, placement could not be confirmed due to current cardiac rhythm.   Scheduled Meds: . amLODipine  10 mg Oral Daily  . carvedilol  12.5 mg Oral BID WC  .  Chlorhexidine Gluconate Cloth  6 each Topical Daily  . feeding supplement (ENSURE ENLIVE)  237 mL Oral BID BM  . isosorbide-hydrALAZINE  1 tablet Oral TID  . lactulose  30 g Oral BID  . lidocaine  15 mL Mouth/Throat TID AC  . losartan  50 mg Oral Daily  . mometasone-formoterol  2 puff Inhalation BID  . nystatin  5 mL Oral QID  . pantoprazole  40 mg Oral BID  . potassium & sodium phosphates  1 packet Oral TID WC & HS  . potassium chloride  40 mEq Oral BID  . predniSONE  20 mg Oral QAC breakfast  . risperiDONE  1 mg Oral QHS  . senna-docusate  1 tablet Oral QHS  . sodium chloride flush  10-40 mL Intracatheter Q12H  . traZODone  50 mg Oral QHS  . umeclidinium bromide  1 puff Inhalation Daily   Continuous Infusions:   LOS: 11 days   Time spent: 25 minutes.  Patrecia Pour, MD [facility] [email or web address] 10/28/2019, 4:41 PM

## 2019-10-28 NOTE — Progress Notes (Signed)
START ON PATHWAY REGIMEN - Small Cell Lung     Cycles 1 through 4, every 21 days:     Atezolizumab      Carboplatin      Etoposide    Cycles 5 and beyond, every 21 days:     Atezolizumab   **Always confirm dose/schedule in your pharmacy ordering system**  Administration Notes: 1st cycle to be given inpatient without immunotherapy  Patient Characteristics: Newly Diagnosed, Preoperative or Nonsurgical Candidate (Clinical Staging), First Line, Extensive Stage Therapeutic Status: Newly Diagnosed, Preoperative or Nonsurgical Candidate (Clinical Staging) AJCC T Category: cT3 AJCC N Category: cN2 AJCC M Category: cM1 AJCC 8 Stage Grouping: IV Stage Classification: Extensive Intent of Therapy: Non-Curative / Palliative Intent, Discussed with Patient

## 2019-10-28 NOTE — Progress Notes (Signed)
PT Cancellation Note  Patient Details Name: Drew Nguyen MRN: [id number] DOB: September 17, 1955   Cancelled Treatment:     Checked on patient and was not available x 2 for PT today. Second time at 3:40 pm- sign on door "do not enter, sterile procedure" . Will reschedule. Rollen Sox, PT # [phone] CGV cell  Casandra Doffing 10/28/2019, 4:15 PM

## 2019-10-28 NOTE — Progress Notes (Signed)
Order to transfer pt to [place] received. Report called to [place], [id number]. Pt being transferred to [place].

## 2019-10-28 NOTE — Progress Notes (Signed)
HEMATOLOGY-ONCOLOGY PROGRESS NOTE  SUBJECTIVE: Less confused this morning.  Still having some shortness of breath but offers no other complaints this morning.  He is focused on trying to get stronger.  REVIEW OF SYSTEMS:   Constitutional: Denies fevers, chills or abnormal weight loss Eyes: Denies blurriness of vision Ears, nose, mouth, throat, and face: Denies mucositis or sore throat Respiratory: Reports some mild shortness of breath Cardiovascular: Denies palpitation, chest discomfort Gastrointestinal:  Denies nausea, heartburn or change in bowel habits Skin: Denies abnormal skin rashes Lymphatics: Denies new lymphadenopathy or easy bruising Neurological:Denies numbness, tingling or new weaknesses Behavioral/Psych: Mood is stable, no new changes  Extremities: No lower extremity edema All other systems were reviewed with the patient and are negative.  I have reviewed the past medical history, past surgical history, social history and family history with the patient and they are unchanged from previous note.   PHYSICAL EXAMINATION: ECOG PERFORMANCE STATUS: 3 - Symptomatic, >50% confined to bed  Vitals:   10/28/19 0450 10/28/19 0834  BP: (!) 176/98   Pulse: 77   Resp: 18   Temp: 97.7 F (36.5 C)   SpO2: 95% 90%   Filed Weights   09/28/2019 2057 11/15/2019 1126  Weight: 70 kg 70 kg    Intake/Output from previous day: 03/08 0701 - 03/09 0700 In: 31 [P.O.:472] Out: 2750 [Urine:2750]  General: Chronically ill-appearing male, no distress Eyes:  no scleral icterus.  Marland Kitchen   Respiratory: lungs were clear bilaterally without wheezing or crackles.   Cardiovascular:  Regular rate and rhythm, S1/S2, without murmur, rub or gallop. There was no pedal edema.   GI:  abdomen was soft, flat, nontender, nondistended, without organomegaly.     Skin: Multiple bruises noted on his upper extremities, no petechiae MSK: Strength 4/5 in the bilateral lower extremities. Neuro exam was nonfocal.     Alert and oriented x3 this morning.  LABORATORY DATA:  I have reviewed the data as listed CMP Latest Ref Rng & Units 10/28/2019 10/27/2019 10/26/2019  Glucose 70 - 99 mg/dL 160(H) 162(H) 228(H)  BUN 8 - 23 mg/dL 26(H) 25(H) 24(H)  Creatinine 0.61 - 1.24 mg/dL 0.66 0.55(L) 0.61  Sodium 135 - 145 mmol/L 140 141 141  Potassium 3.5 - 5.1 mmol/L 3.2(L) 3.5 2.9(L)  Chloride 98 - 111 mmol/L 103 105 103  CO2 22 - 32 mmol/L 26 27 26   Calcium 8.9 - 10.3 mg/dL 8.0(L) 8.2(L) 8.6(L)  Total Protein 6.5 - 8.1 g/dL 5.8(L) 5.7(L) 5.8(L)  Total Bilirubin 0.3 - 1.2 mg/dL 1.2 1.0 0.8  Alkaline Phos 38 - 126 U/L 413(H) 360(H) 376(H)  AST 15 - 41 U/L 155(H) 113(H) 104(H)  ALT 0 - 44 U/L 349(H) 319(H) 375(H)    Lab Results  Component Value Date   WBC 13.6 (H) 10/28/2019   HGB 11.1 (L) 10/28/2019   HCT 33.2 (L) 10/28/2019   MCV 95.4 10/28/2019   PLT 149 (L) 10/28/2019   NEUTROABS 12.0 (H) 10/25/2019    EEG  Result Date: 10/17/2019 Lora Havens, MD     10/17/2019  1:38 PM Patient Name: SEELEY HISSONG MRN: [id number] Epilepsy Attending: Lora Havens Referring Physician/Provider: Dr. Gean Birchwood Date: 10/17/2019 Duration: 25.26 minutes Patient history: 64 year old male with history of alcohol use, cocaine use who presented with generalized tonic-clonic seizure-like episode.  EEG to evaluate for seizures. Level of alertness: Awake AEDs during EEG study: Ativan Technical aspects: This EEG study was done with scalp electrodes positioned according to the 10-20 International system  of electrode placement. Electrical activity was acquired at a sampling rate of 500Hz  and reviewed with a high frequency filter of 70Hz  and a low frequency filter of 1Hz . EEG data were recorded continuously and digitally stored. Description: The posterior dominant rhythm consists of 9-10 Hz activity of moderate voltage (25-35 uV) seen predominantly in posterior head regions, symmetric and reactive to eye opening and eye closing.  There  is also 15 to 18 Hz beta activity  distributed symmetrically and diffusely.  Physiologic photic driving was seen during photic stimulation.  Hyperventilation was not performed. IMPRESSION: This study is within normal limits. No seizures or epileptiform discharges were seen throughout the recording. Lora Havens   DG Chest 2 View  Result Date: 10/25/2019 CLINICAL DATA:  Cough. Right-sided chest pain. Shortness of breath. EXAM: CHEST - 2 VIEW COMPARISON:  Radiograph CT 01/08/2020. Chest CT 10/17/2019 FINDINGS: Progressive volume loss in the left hemithorax with progressive opacification throughout, patient with known left hilar mass. This is likely increasing postobstructive atelectasis throughout the left lung. Difficult to exclude an element of pleural effusion. Right lung is slightly hyperinflated but clear. Cardiomediastinal contours are obscured by left lung opacity. No evidence of right pulmonary edema. No visualized pneumothorax. IMPRESSION: Progressive volume loss in the left hemithorax with increased opacification, likely increasing postobstructive atelectasis throughout the left lung given history of left hilar mass and upper lobe collapse on recent CT. Difficult to exclude an element of pleural fluid on the left. Electronically Signed   By: Keith Rake M.D.   On: 10/25/2019 19:26   CT HEAD WO CONTRAST  Result Date: 10/25/2019 CLINICAL DATA:  Slurred speech, history of CVAs, neuro deficit, acute, stroke suspected. EXAM: CT HEAD WITHOUT CONTRAST TECHNIQUE: Contiguous axial images were obtained from the base of the skull through the vertex without intravenous contrast. COMPARISON:  Brain MRI 10/17/2019, head CT 10/14/2019. FINDINGS: Brain: There is no evidence of acute intracranial hemorrhage, intracranial mass, midline shift or extra-axial fluid collection.No demarcated cortical infarction. Redemonstrated chronic lacunar infarcts within the bilateral corona radiata/basal ganglia. Background  ill-defined hypoattenuation within the cerebral white matter is nonspecific, but consistent with chronic small vessel ischemic disease. Stable, mild generalized parenchymal atrophy. Vascular: No hyperdense vessel.  Atherosclerotic calcifications. Skull: Normal. Negative for fracture or focal lesion. Sinuses/Orbits: Visualized orbits demonstrate no acute abnormality. No significant paranasal sinus disease or mastoid effusion at the imaged levels. IMPRESSION: No evidence of acute intracranial abnormality. Redemonstrated chronic bilateral corona radiata/basal ganglia lacunar infarcts. Stable background generalized parenchymal atrophy and chronic small vessel ischemic disease. Electronically Signed   By: Kellie Simmering DO   On: 10/25/2019 18:33   CT Head Wo Contrast  Result Date: 09/24/2019 CLINICAL DATA:  Altered level of consciousness, seizure EXAM: CT HEAD WITHOUT CONTRAST TECHNIQUE: Contiguous axial images were obtained from the base of the skull through the vertex without intravenous contrast. COMPARISON:  06/05/2019, 06/06/2019 FINDINGS: Brain: There are chronic small vessel ischemic changes seen within the bilateral basal ganglia and periventricular white matter. No acute infarct or hemorrhage. Lateral ventricles and remaining midline structures are unremarkable. No acute extra-axial fluid collections. No mass effect. Vascular: No hyperdense vessel or unexpected calcification. Skull: Normal. Negative for fracture or focal lesion. Sinuses/Orbits: No acute finding. Other: None IMPRESSION: 1. Chronic small vessel ischemic changes, no acute intracranial process. Electronically Signed   By: Randa Ngo M.D.   On: 10/11/2019 22:54   CT CHEST W CONTRAST  Result Date: 10/17/2019 CLINICAL DATA:  Abnormal chest x-ray, pulmonary nodule, seizure,  altered level of consciousness EXAM: CT CHEST WITH CONTRAST TECHNIQUE: Multidetector CT imaging of the chest was performed during intravenous contrast administration.  CONTRAST:  20mL OMNIPAQUE IOHEXOL 300 MG/ML  SOLN COMPARISON:  10/10/2019, 03/04/2019 FINDINGS: Cardiovascular: The heart is unremarkable without pericardial effusion. Thoracic aorta is normal in caliber without aneurysm or dissection. No filling defects or pulmonary emboli. Extrinsic compression upon the left pulmonary artery due to left hilar mass. Moderate atherosclerosis of the coronary vasculature, with mild calcified plaque of the aortic arch. Mediastinum/Nodes: There is a lobular soft tissue mass in the mediastinum and AP window, measuring 4.7 x 5.3 cm. Large left hilar mass with obstruction of the left upper lobe bronchus. No contralateral hilar adenopathy. There is circumferential wall thickening of the distal thoracic esophagus, nonspecific. Endoscopy may be useful. Lungs/Pleura: There is complete collapse of the left upper lobe due to a large left hilar mass causing abrupt cut off of the left upper lobe bronchus. Distinguishing the left hilar mass from the consolidated left upper lobe is difficult. PET scan recommended for further evaluation. Background emphysema. Minimal tree in bud ground-glass airspace disease within the left lower lobe likely inflammatory or infectious. There is narrowing of the left lower lobe bronchus with mucoid material filling the posterior basilar segmental bronchi. No effusion or pneumothorax. Upper Abdomen: Thickening of the left adrenal gland measuring 12 mm, suspicious for metastases. Remainder of the upper abdomen is unremarkable. Musculoskeletal: No acute or destructive bony lesions. Reconstructed images demonstrate no additional findings. IMPRESSION: 1. 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 This mass results in extrinsic compression of the left upper lobe pulmonary artery, as well as complete obstruction of the left upper lobe bronchus and complete left upper lobe atelectasis. 2. Circumferential mural thickening  of the distal thoracic esophagus. Endoscopy recommended for further evaluation. 3. Minimal left lower lobe tree-in-bud ground-glass airspace disease consistent with inflammation or infection. 4. Left adrenal thickening, nonspecific. Metastatic disease not excluded. 5.  Emphysema (ICD10-J43.9). Electronically Signed   By: Randa Ngo M.D.   On: 10/17/2019 02:01   MR BRAIN W WO CONTRAST  Result Date: 10/17/2019 CLINICAL DATA:  Seizure. Abnormal neuro exam. Hypertension. Patient stopped drinking daily alcohol 1 week ago. Confusion. EXAM: MRI HEAD WITHOUT AND WITH CONTRAST TECHNIQUE: Multiplanar, multiecho pulse sequences of the brain and surrounding structures were obtained without and with intravenous contrast. CONTRAST:  50mL GADAVIST GADOBUTROL 1 MMOL/ML IV SOLN COMPARISON:  CT head without contrast 09/24/2019. MRI of the head 04/19/2019 FINDINGS: Brain: The diffusion-weighted images demonstrate no acute or subacute infarct. Remote lacunar infarcts are present within the basal ganglia extending superiorly into the corona radiata bilaterally. Mild atrophy and white matter disease is present bilaterally, otherwise stable. The basal ganglia are otherwise unremarkable. Insular ribbon is within limits bilaterally. Study is mildly degraded by patient motion. Temporal lobes are normal. The internal auditory canals are normal bilaterally. The brainstem and cerebellum are normal. Postcontrast images demonstrate no pathologic enhancement. Vascular: Flow is present in the major intracranial arteries. Skull and upper cervical spine: The craniocervical junction is normal. Upper cervical spine is within normal limits. Marrow signal is unremarkable. Sinuses/Orbits: The paranasal sinuses and mastoid air cells are clear. The globes and orbits are within normal limits. IMPRESSION: 1. Remote nonhemorrhagic lacunar infarcts of the basal ganglia bilaterally extending into the corona radiata. 2. Atrophy and white matter disease are  mildly advanced for age. 3. No acute or focal intracranial abnormality to explain seizures or confusion.  Electronically Signed   By: San Morelle M.D.   On: 10/17/2019 05:30   CT ABDOMEN PELVIS W CONTRAST  Result Date: 10/22/2019 CLINICAL DATA:  64 year old male with history of adrenal mass suspicious for malignancy. Follow-up study. EXAM: CT ABDOMEN AND PELVIS WITH CONTRAST TECHNIQUE: Multidetector CT imaging of the abdomen and pelvis was performed using the standard protocol following bolus administration of intravenous contrast. CONTRAST:  162mL OMNIPAQUE IOHEXOL 300 MG/ML  SOLN COMPARISON:  No priors. FINDINGS: Lower chest: Atherosclerotic calcifications in the left circumflex and right coronary arteries. Hepatobiliary: Multiple ill-defined hypovascular lesions are scattered throughout the hepatic parenchyma, highly concerning for widespread metastatic disease. One specific example is the lesion at the junction of segment 8 and the caudate lobe (axial image 19 of series 3) measuring 3.2 x 2.1 cm. No intra or extrahepatic biliary ductal dilatation. Gallbladder is nearly completely collapsed with avid enhancement of the gallbladder mucosa and diffuse wall edema, without surrounding inflammatory changes. Pancreas: No pancreatic mass. No pancreatic ductal dilatation. No pancreatic or peripancreatic fluid collections or inflammatory changes. Spleen: Unremarkable. Adrenals/Urinary Tract: Subcentimeter low-attenuation lesions in both kidneys, too small to characterize, but statistically likely to represent cysts. No hydroureteronephrosis. Urinary bladder is normal in appearance. Right adrenal gland is normal. 1.2 cm left adrenal nodule (axial image 23 of series 3), similar to the prior chest CT 10/06/2019. Stomach/Bowel: Normal appearance of the stomach. No pathologic dilatation of small bowel or colon. Numerous colonic diverticulae are noted, without surrounding inflammatory changes to suggest an acute  diverticulitis at this time. Normal appendix. Vascular/Lymphatic: Aortic atherosclerosis, without evidence of aneurysm or dissection in the abdominal or pelvic vasculature. No lymphadenopathy noted in the abdomen or pelvis. Reproductive: Prostate gland and seminal vesicles are unremarkable in appearance. Other: No significant volume of ascites.  No pneumoperitoneum. Musculoskeletal: There are no aggressive appearing lytic or blastic lesions noted in the visualized portions of the skeleton. IMPRESSION: 1. Small left adrenal nodule is nonspecific. This could either be a benign or malignant lesion. 2. Importantly, there are innumerable hypovascular areas in the liver which are highly suspicious for metastatic disease. This could be better evaluated with follow-up abdominal MRI with and without IV gadolinium. 3. Colonic diverticulosis without evidence of acute diverticulitis at this time. 4. Additional incidental findings, as above. Electronically Signed   By: Vinnie Langton M.D.   On: 10/22/2019 16:40   MR LIVER W WO CONTRAST  Result Date: 10/24/2019 CLINICAL DATA:  Indeterminate liver lesions on recent CT. Recently diagnosed lung carcinoma. EXAM: MRI ABDOMEN WITHOUT AND WITH CONTRAST TECHNIQUE: Multiplanar multisequence MR imaging of the abdomen was performed both before and after the administration of intravenous contrast. CONTRAST:  48mL GADAVIST GADOBUTROL 1 MMOL/ML IV SOLN COMPARISON:  CT on 10/22/2019 FINDINGS: Lower chest: No acute findings. Hepatobiliary: Innumerable small hypovascular masses are seen throughout the liver, consistent with diffuse liver metastases. Gallbladder is nearly completely collapsed. No evidence of biliary ductal dilatation. Pancreas:  No mass or inflammatory changes. Spleen:  Within normal limits in size and appearance. Adrenals/Urinary Tract: No masses identified. Small bilateral renal cysts noted. No evidence of hydronephrosis. Stomach/Bowel: Visualized portion unremarkable.  Vascular/Lymphatic: Mild lymphadenopathy in porta hepatis measuring 1.9 cm short axis. No abdominal aortic aneurysm. Other:  None. Musculoskeletal: Diffuse enhancing T2 hyperintense bone lesions throughout the visualized portion of the thoracolumbar spine and pelvis, consistent with diffuse bone metastases. IMPRESSION: Diffuse liver metastases. Diffuse bone metastases. Mild abdominal lymphadenopathy in porta hepatis, consistent with metastatic disease. Electronically Signed   By: Jenny Reichmann  Tereso Newcomer M.D.   On: 10/24/2019 19:23   DG Chest Portable 1 View  Result Date: 09/30/2019 CLINICAL DATA:  Seizure, altered mental status EXAM: PORTABLE CHEST 1 VIEW COMPARISON:  06/06/2019 FINDINGS: Right lung is grossly clear. Asymmetric hazy opacification in the left thorax with increased left hilar density. Heart size upper limits of normal. No pneumothorax. IMPRESSION: Asymmetric hazy opacity in left thorax with asymmetric density in the left hilus, could be due to diffuse lung parenchymal air disease versus lung mass. Chest CT is recommended for further evaluation. Electronically Signed   By: Donavan Foil M.D.   On: 10/04/2019 22:42   [place] EKG SITE RITE  Result Date: 10/28/2019 If Site Rite image not attached, placement could not be confirmed due to current cardiac rhythm.   ASSESSMENT AND PLAN: Small cell lung cancer -Left hilar mass with mediastinal lymph hilar adenopathy, extrinsic compression of the left upper lobe pulmonary artery and complete obstruction of the left upper lobe bronchus noted on CT scan of the chest on 10/17/2019 -MRI of the brain did not show evidence of metastases on 10/17/2019 -Status post EBUS on 11/06/2019 -pathology consistent with small cell lung cancer -CT of the abdomen pelvis on 10/20/2019 with a nonspecific left adrenal nodule and innumerable lesions in the liver suspicious for metastatic disease -I have again discussed the diagnosis with the patient who seems to have limited understanding  of this conversation.  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  Treatment for this is he would not be curative but will be given with palliative intent. -His performance status is marginal, ECOG performance status of 3 at best -Additionally, he continues to be encephalopathic which could represent a paraneoplastic process. -He has been started on palliative radiation. -In prior discussions with his brother, [name], he understands that his cancer is not curable.    -The patient's brother has expressed that he understands that this is not a curable disease but would like to attempt aggressive treatment including chemotherapy. -Although this patient's performance status is borderline, his neurological changes could be related to his underlying malignancy.  Discussed proceeding with dose reduced chemotherapy as an inpatient starting on 10/29/2019 with both his brother and the patient.  They are agreeable to proceed. -Recommend transfer to [facility] in preparation to start chemotherapy on 10/29/2019 and recommend PICC line placement. -Recommend SNF for rehabilitation.  Generalized tonic-clonic seizures/encephalopathy -He has not had any recurrent seizures and mental status slowly improving -He has been started on lactulose for persistently elevated ammonia level -Neurology has previously evaluated and has now signed off  Hypokalemia, hypomagnesemia, and hypophosphatemia, improving -Replete per hospitalist  Elevated LFTs -LFTs remain elevated, ? Due to liver lesions versus medications -Monitor for now  Leukocytosis -He remains afebrile with negative cultures -Likely due to prednisone  Hypertension and history of CVA -Continue aggressive management of his hypertension -Remains on aspirin 81 mg daily  Rheumatoid arthritis -The patient has been maintained on high-dose prednisone for many years -He has been on Plaquenil and Enbrel in the past which are now on hold -He is  currently on prednisone 40 mg daily  History of alcohol and cocaine use -The patient reports that he is not drinking all alcohol and urine drug screen on admission was negative  Tobacco dependence -Smoking cessation was encouraged    LOS: 11 days   Mikey Bussing, DNP, AGPCNP-BC, AOCNP 10/28/19

## 2019-10-28 NOTE — Progress Notes (Signed)
Patient arrived to the floor at [facility]. Pt is not in any distress will continue to monitor.

## 2019-10-28 NOTE — Progress Notes (Signed)
Peripherally Inserted Central Catheter/Midline Placement  The IV Nurse has discussed with the patient and/or persons authorized to consent for the patient, the purpose of this procedure and the potential benefits and risks involved with this procedure.  The benefits include less needle sticks, lab draws from the catheter, and the patient may be discharged home with the catheter. Risks include, but not limited to, infection, bleeding, blood clot (thrombus formation), and puncture of an artery; nerve damage and irregular heartbeat and possibility to perform a PICC exchange if needed/ordered by physician.  Alternatives to this procedure were also discussed.  Bard Power PICC patient education guide, fact sheet on infection prevention and patient information card has been provided to patient /or left at bedside.    PICC/Midline Placement Documentation  PICC Single Lumen 43/27/61 PICC Right Basilic 39 cm 0 cm (Active)  Indication for Insertion or Continuance of Line Administration of hyperosmolar/irritating solutions (i.e. TPN, Vancomycin, etc.) 10/28/19 1621  Exposed Catheter (cm) 0 cm 10/28/19 1621  Site Assessment Clean;Dry;Intact 10/28/19 1621  Line Status Blood return noted;Flushed;Saline locked 10/28/19 1621  Dressing Type Transparent;Securing device 10/28/19 1621  Dressing Status Clean;Dry;Intact;Antimicrobial disc in place 10/28/19 1621  Dressing Change Due 11/04/19 10/28/19 1621       Drew Nguyen 10/28/2019, 4:25 PM

## 2019-10-29 ENCOUNTER — Ambulatory Visit
Admit: 2019-10-29 | Discharge: 2019-10-29 | Disposition: A | Discharge status: Home / Self Care | Payer: Medicaid Other | Attending: Radiation Oncology | Admitting: Radiation Oncology

## 2019-10-29 DIAGNOSIS — D72829 Elevated white blood cell count, unspecified: Secondary | ICD-10-CM

## 2019-10-29 LAB — CBC
HCT: 33.2 % — ABNORMAL LOW (ref 39.0–52.0)
Hemoglobin: 11 g/dL — ABNORMAL LOW (ref 13.0–17.0)
MCH: 31.7 pg (ref 26.0–34.0)
MCHC: 33.1 g/dL (ref 30.0–36.0)
MCV: 95.7 fL (ref 80.0–100.0)
Platelets: 153 10*3/uL (ref 150–400)
RBC: 3.47 MIL/uL — ABNORMAL LOW (ref 4.22–5.81)
RDW: 13.1 % (ref 11.5–15.5)
WBC: 13 10*3/uL — ABNORMAL HIGH (ref 4.0–10.5)
nRBC: 0.3 % — ABNORMAL HIGH (ref 0.0–0.2)

## 2019-10-29 LAB — COMPREHENSIVE METABOLIC PANEL
ALT: 311 U/L — ABNORMAL HIGH (ref 0–44)
AST: 144 U/L — ABNORMAL HIGH (ref 15–41)
Albumin: 2.7 g/dL — ABNORMAL LOW (ref 3.5–5.0)
Alkaline Phosphatase: 412 U/L — ABNORMAL HIGH (ref 38–126)
Anion gap: 11 (ref 5–15)
BUN: 26 mg/dL — ABNORMAL HIGH (ref 8–23)
CO2: 25 mmol/L (ref 22–32)
Calcium: 7.9 mg/dL — ABNORMAL LOW (ref 8.9–10.3)
Chloride: 103 mmol/L (ref 98–111)
Creatinine, Ser: 0.71 mg/dL (ref 0.61–1.24)
GFR calc Af Amer: >60 mL/min (ref >60)
GFR calc non Af Amer: >60 mL/min (ref >60)
Glucose, Bld: 164 mg/dL — ABNORMAL HIGH (ref 70–99)
Potassium: 3 mmol/L — ABNORMAL LOW (ref 3.5–5.1)
Sodium: 139 mmol/L (ref 135–145)
Total Bilirubin: 1.3 mg/dL — ABNORMAL HIGH (ref 0.3–1.2)
Total Protein: 5.4 g/dL — ABNORMAL LOW (ref 6.5–8.1)

## 2019-10-29 LAB — MAGNESIUM: Magnesium: 1.9 mg/dL (ref 1.7–2.4)

## 2019-10-29 MED ORDER — SODIUM CHLORIDE 0.9 % IV SOLN
10.0000 mg | Freq: Once | INTRAVENOUS | Status: AC
  Administered 2019-10-29: 10 mg via INTRAVENOUS
  Filled 2019-10-29: qty 1

## 2019-10-29 MED ORDER — EPINEPHRINE 0.3 MG/0.3ML IJ SOAJ
0.3000 mg | Freq: Once | INTRAMUSCULAR | Status: DC | PRN
  Filled 2019-10-29: qty 0.6

## 2019-10-29 MED ORDER — PROCHLORPERAZINE MALEATE 10 MG PO TABS
10.0000 mg | ORAL_TABLET | Freq: Four times a day (QID) | ORAL | Status: DC | PRN
  Filled 2019-10-29: qty 1

## 2019-10-29 MED ORDER — SODIUM CHLORIDE 0.9 % IV SOLN
150.0000 mg | Freq: Once | INTRAVENOUS | Status: AC
  Administered 2019-10-29: 13:00:00 150 mg via INTRAVENOUS
  Filled 2019-10-29: qty 5

## 2019-10-29 MED ORDER — SODIUM CHLORIDE 0.9 % IV SOLN
60.0000 mg/m2 | Freq: Once | INTRAVENOUS | Status: AC
  Administered 2019-10-29: 110 mg via INTRAVENOUS
  Filled 2019-10-29: qty 5.5

## 2019-10-29 MED ORDER — HOT PACK MISC ONCOLOGY
1.0000 | Freq: Once | Status: AC | PRN
  Filled 2019-10-29: qty 1

## 2019-10-29 MED ORDER — ALBUTEROL SULFATE (2.5 MG/3ML) 0.083% IN NEBU
2.5000 mg | INHALATION_SOLUTION | Freq: Once | RESPIRATORY_TRACT | Status: DC | PRN
  Filled 2019-10-29 (×2): qty 3

## 2019-10-29 MED ORDER — CLOPIDOGREL BISULFATE 75 MG PO TABS
75.0000 mg | ORAL_TABLET | Freq: Every day | ORAL | Status: DC
  Administered 2019-10-30 – 2019-11-01 (×3): 75 mg via ORAL
  Filled 2019-10-29 (×3): qty 1

## 2019-10-29 MED ORDER — ONDANSETRON HCL 4 MG PO TABS: 4.0000 mg | ORAL_TABLET | Freq: Four times a day (QID) | ORAL | Status: DC | PRN

## 2019-10-29 MED ORDER — SODIUM CHLORIDE 0.9 % IV SOLN: Freq: Once | INTRAVENOUS | Status: DC

## 2019-10-29 MED ORDER — PALONOSETRON HCL INJECTION 0.25 MG/5ML
0.2500 mg | Freq: Once | INTRAVENOUS | Status: AC
  Administered 2019-10-29: 0.25 mg via INTRAVENOUS
  Filled 2019-10-29: qty 5

## 2019-10-29 MED ORDER — ONDANSETRON HCL 4 MG/2ML IJ SOLN: 4.0000 mg | Freq: Four times a day (QID) | INTRAMUSCULAR | Status: DC | PRN

## 2019-10-29 MED ORDER — SODIUM CHLORIDE 0.9 % IV SOLN: Freq: Once | INTRAVENOUS | Status: DC | PRN

## 2019-10-29 MED ORDER — DIPHENHYDRAMINE HCL 50 MG/ML IJ SOLN: 50.0000 mg | Freq: Once | INTRAMUSCULAR | Status: DC | PRN

## 2019-10-29 MED ORDER — FAMOTIDINE IN NACL 20-0.9 MG/50ML-% IV SOLN: 20.0000 mg | Freq: Once | INTRAVENOUS | Status: DC | PRN

## 2019-10-29 MED ORDER — ASPIRIN 81 MG PO TBEC
81.0000 mg | DELAYED_RELEASE_TABLET | Freq: Every day | ORAL | Status: DC
  Filled 2019-10-29: qty 1

## 2019-10-29 MED ORDER — TBO-FILGRASTIM 300 MCG/0.5ML ~~LOC~~ SOSY
300.0000 ug | PREFILLED_SYRINGE | Freq: Every day | SUBCUTANEOUS | Status: DC
  Administered 2019-11-01 – 2019-11-04 (×4): 300 ug via SUBCUTANEOUS
  Filled 2019-10-29 (×5): qty 0.5

## 2019-10-29 MED ORDER — SODIUM CHLORIDE 0.9 % IV SOLN
470.0000 mg | Freq: Once | INTRAVENOUS | Status: AC
  Administered 2019-10-29: 470 mg via INTRAVENOUS
  Filled 2019-10-29: qty 47

## 2019-10-29 MED ORDER — METHYLPREDNISOLONE SODIUM SUCC 125 MG IJ SOLR: 125.0000 mg | Freq: Once | INTRAMUSCULAR | Status: DC | PRN

## 2019-10-29 NOTE — Progress Notes (Signed)
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  MRI showed remote lacunar infarct without acute abnormality.  Later EEG was reportedly normal.  Chest x-ray was abnormal and subsequent CT chest demonstrated a hilar mass with associated adenopathy and compression of left upper lobe bronchus and pulmonary artery with distal atelectasis.  This also showed distal esophageal thickening and nonspecific left adrenal thickening.  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  CT abdomen and pelvis performed to follow-up left adrenal finding revealed a nonspecific nodule with innumerable hypovascular area in the liver confirmed on subsequent MRI, which also showed diffuse enhancing of T2 hyperintense bone lesions throughout the visualized portion of the thoracolumbar spine and pelvis, consistent with diffuse bone metastasis.   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 Appreciate oncology consultation, plan is to initiate chemotherapy on 3/10  until 3/12 as an inpatient Radiation oncology guiding radiation therapy initiated as an inpatient Palliative care team also consulted and following  2-Hepatic metastasis with hepatic encephalopathy: Liver function tests continue to be elevated Hepatitis panel negative Continue with lactulose Delirium has improved  LUL postobstructive CAP: Completed doxycycline from 12/26 until 3/2  Bleeding at Lovenox injection site: Apply pressure.  Lovenox discontinued Hemoglobin has remained stable  Hyponatremia possible due to Potomania.  resolved  Generalized tonic-clonic seizure acute toxic metabolic encephalopathy: EEG negative Neurology has signed off  Hypokalemia: Continue with potassium supplementation, BID>   History of recurrent CVA Plavix held around time of biopsy.  Plan to resume Plavix and aspirin  tomorrow  Hypertension: With hypertensive urgency: Continue with Norco mask losartan and Coreg, BiDil  RA;  Tapering from a stress dose of steroid not on prednisone Usually takes 15 mg daily Holding Plaquenil and Enbrel  Polysubstance abuse, alcohol abuse tobacco abuse: Counseling provided  COPD no exacerbation Esophageal thickening:  Slurred speech: Multifactorial most likely due to encephalopathy/possibly paraneoplastic, dry mouth causing dysarthria. Subtle right-sided lower facial weakness is only focal deficit on exam. CT head is without acute stroke.  - Continue augmented lactulose Tx.  - Supportive care otherwise. Seems improved today. If continues and/or waxes/wanes, consider changing risperdal and other psychoactive medications.  Esophageal thickening: Appreciate GI consultation, patient was a started on PPI twice daily and nystatin for oral thrush  Depression: Continue with Celexa and risperidone  FTT,: Require skilled nursing facility for admission    Estimated body mass index is 24.91 kg/m as calculated from the following:   Height as of this encounter: 5\' 6"   (1.676 m).  Weight as of this encounter: 70 kg.   DVT prophylaxis: SCDs Code Status: Full code Family Communication: Care discussed with patient directly Disposition Plan:  Patient is from: Home Anticipated d/c date: In 2 to 3 days if he is able to tolerate chemotherapy Barriers to d/c or necessity for inpatient status: Plan to start with chemotherapy today  Consultants:   Oncology  Neurology  PCCM  Radiation oncology  Palliative care  GI  Procedures:  -EEG -Flexible video fiberoptic bronchoscopy with endobronchial ultrasound and biopsies on March 2 by Dr. Shearon Stalls  Antimicrobials:  Doxycycline from 2/26--3/2  Subjective: Alert and conversant, he report decreased appetite, he reports having oral thrush.  Awaiting to start chemotherapy today  Objective: Vitals:   10/28/19 1923 10/29/19 0654 10/29/19 0730 10/29/19 1220  BP:  (!) 151/90  (!) 143/70  Pulse:  75  79  Resp:  15  20  Temp:  97.8 F (36.6 C)  98.4 F (36.9 C)  TempSrc:  Oral  Oral  SpO2: 92% 95% 92% 95%  Weight:      Height:        Intake/Output Summary (Last 24 hours) at 10/29/2019 1708 Last data filed at 10/29/2019 1133 Gross per 24 hour  Intake -  Output 150 ml  Net -150 ml   Filed Weights   10/01/2019 2057 11/12/2019 1126  Weight: 70 kg 70 kg    Examination:  General exam: Appears calm and comfortable  Respiratory system: Clear to auscultation. Respiratory effort normal. Cardiovascular system: S1 & S2 heard, RRR. No JVD, murmurs, rubs, gallops or clicks. No pedal edema. Gastrointestinal system: Abdomen is nondistended, soft and nontender. No organomegaly or masses felt. Normal bowel sounds heard. Central nervous system: Alert and oriented. No focal neurological deficits. Extremities: Symmetric 5 x 5 power. Skin: No rashes, lesions or ulcers Psychiatry: Judgement and insight appear normal. Mood & affect appropriate.     Data Reviewed: I have personally reviewed following labs and imaging  studies  CBC: Recent Labs  Lab 10/23/19 1116 10/23/19 1116 10/24/19 0811 10/24/19 0811 10/25/19 0545 10/26/19 0548 10/27/19 0557 10/28/19 0608 10/29/19 0530  WBC 17.1*   < > 15.8*   < > 14.2* 14.2* 14.0* 13.6* 13.0*  NEUTROABS 15.0*  --  13.2*  --  12.0*  --   --   --   --   HGB 13.3   < > 12.5*   < > 12.5* 12.3* 10.8* 11.1* 11.0*  HCT 38.9*   < > 36.7*   < > 37.6* 37.8* 32.9* 33.2* 33.2*  MCV 92.0   < > 93.6   < > 95.7 96.2 96.2 95.4 95.7  PLT 208   < > 189   < > 166 159 153 149* 153   < > = values in this interval not displayed.   Basic Metabolic Panel: Recent Labs  Lab 10/23/19 1116 10/23/19 1116 10/24/19 0811 10/24/19 9562 10/25/19 0545 10/26/19 0548 10/27/19 0557 10/28/19 0608 10/29/19 0530  NA 143   < > 141   < > 140 141 141 140 139  K 3.5   < > 3.0*   < > 2.4* 2.9* 3.5 3.2* 3.0*  CL 107   < > 102   < > 103 103 105 103 103  CO2 22   < > 25   < > 27 26 27 26 25   GLUCOSE 188*   < > 165*   < > 153* 228* 162* 160* 164*  BUN 11   < >  13   < > 17 24* 25* 26* 26*  CREATININE 0.62   < > 0.73   < > 0.52* 0.61 0.55* 0.66 0.71  CALCIUM 8.3*   < > 8.2*   < > 8.2* 8.6* 8.2* 8.0* 7.9*  MG 1.5*   < > 1.8   < > 1.8 1.8 2.1 2.0 1.9  PHOS 1.5*  --  2.5  --  2.7  --   --   --   --    < > = values in this interval not displayed.   GFR: Estimated Creatinine Clearance: 84.2 mL/min (by C-G formula based on SCr of 0.71 mg/dL). Liver Function Tests: Recent Labs  Lab 10/25/19 0545 10/26/19 0548 10/27/19 0557 10/28/19 0608 10/29/19 0530  AST 167* 104* 113* 155* 144*  ALT 466* 375* 319* 349* 311*  ALKPHOS 361* 376* 360* 413* 412*  BILITOT 1.0 0.8 1.0 1.2 1.3*  PROT 6.0* 5.8* 5.7* 5.8* 5.4*  ALBUMIN 3.2* 3.0* 2.9* 3.1* 2.7*   No results for input(s): LIPASE, AMYLASE in the last 168 hours. Recent Labs  Lab 10/23/19 1116 10/24/19 0811  AMMONIA 67* 52*   Coagulation Profile: Recent Labs  Lab 10/26/19 0548  INR 1.0   Cardiac Enzymes: No results for input(s): CKTOTAL,  CKMB, CKMBINDEX, TROPONINI in the last 168 hours. BNP (last 3 results) No results for input(s): PROBNP in the last 8760 hours. HbA1C: No results for input(s): HGBA1C in the last 72 hours. CBG: No results for input(s): GLUCAP in the last 168 hours. Lipid Profile: No results for input(s): CHOL, HDL, LDLCALC, TRIG, CHOLHDL, LDLDIRECT in the last 72 hours. Thyroid Function Tests: No results for input(s): TSH, T4TOTAL, FREET4, T3FREE, THYROIDAB in the last 72 hours. Anemia Panel: No results for input(s): VITAMINB12, FOLATE, FERRITIN, TIBC, IRON, RETICCTPCT in the last 72 hours. Sepsis Labs: No results for input(s): PROCALCITON, LATICACIDVEN in the last 168 hours.  No results found for this or any previous visit (from the past 240 hour(s)).       Radiology Studies: [place] EKG SITE RITE  Result Date: 10/28/2019 If Site Rite image not attached, placement could not be confirmed due to current cardiac rhythm.       Scheduled Meds: . amLODipine  10 mg Oral Daily  . carvedilol  12.5 mg Oral BID WC  . Chlorhexidine Gluconate Cloth  6 each Topical Daily  . feeding supplement (ENSURE ENLIVE)  237 mL Oral BID BM  . isosorbide-hydrALAZINE  1 tablet Oral TID  . lactulose  30 g Oral BID  . lidocaine  15 mL Mouth/Throat TID AC  . losartan  50 mg Oral Daily  . mometasone-formoterol  2 puff Inhalation BID  . nystatin  5 mL Oral QID  . pantoprazole  40 mg Oral BID  . potassium & sodium phosphates  1 packet Oral TID WC & HS  . potassium chloride  40 mEq Oral BID  . predniSONE  20 mg Oral QAC breakfast  . risperiDONE  1 mg Oral QHS  . senna-docusate  1 tablet Oral QHS  . sodium chloride flush  10-40 mL Intracatheter Q12H  . [START ON 11/01/2019] Tbo-Filgrastim  300 mcg Subcutaneous Daily  . traZODone  50 mg Oral QHS  . umeclidinium bromide  1 puff Inhalation Daily   Continuous Infusions: . sodium chloride    . sodium chloride    . famotidine (PEPCID) IV (ONCOLOGY)       LOS: 12 days     Time spent: 35  minutes.     Elmarie Shiley, MD [facility]   If 7PM-7AM, please contact night-coverage [email or web address]  10/29/2019, 5:08 PM

## 2019-10-29 NOTE — Progress Notes (Signed)
OT Cancellation Note  Patient Details Name: Drew Nguyen MRN: [id number] DOB: 05/15/1956   Cancelled Treatment:    Reason Eval/Treat Not Completed: Patient at procedure or test/ unavailable. Receiving chemo treatment. Will attempt treatment on different date and time.   Damani Kelemen 10/29/2019, 3:42 PM

## 2019-10-29 NOTE — Progress Notes (Signed)
HEMATOLOGY-ONCOLOGY PROGRESS NOTE  ASSESSMENT AND PLAN:  Metastatic small cell lung cancer -After lengthy discussion with the patient and his brother (healthcare POA) yesterday, the decision was made to proceed with palliative chemotherapy -The dose of chemotherapy has been reduced to avoid excessive toxicity  Carboplatin AUC 4 on Day 1, etoposide 60mg /m2 on Days 1-2  Granix 341mcg, starting on Day 4, x 7 days  -If he tolerates the first cycle of chemotherapy relatively well, then we can consider increasing the dose of chemotherapy with subsequent treatment -However, if he tolerates the treatment poorly, then I would advise reassessing the goals of care and considering palliative care/hospice -Continue palliative radiation to the left lung mass as scheduled  Leukocytosis -Likely secondary to steroid and reactive in the setting of malignancy -WBC 13.0k today, stable -Patient denies any symptoms of infection -Continue to monitor it for now   Goals of care discussion -The patient understands that the goal of treatment is for palliative intent only, not curative -If he does not tolerate dose reduced chemotherapy, then I would strongly recommend considering hospice  Tish Men, MD   SUBJECTIVE:  Patient was alert and resting in bed this morning.  He had a chance to visit with his home nurse yesterday.  He denies any complaint this morning.   REVIEW OF SYSTEMS:   Constitutional: Denies fevers, chills or abnormal weight loss Eyes: Denies blurriness of vision Ears, nose, mouth, throat, and face: Denies mucositis or sore throat Respiratory: Reports some mild shortness of breath Cardiovascular: Denies palpitation, chest discomfort Gastrointestinal:  Denies nausea, heartburn or change in bowel habits Skin: Denies abnormal skin rashes Lymphatics: Denies new lymphadenopathy or easy bruising Neurological:Denies numbness, tingling or new weaknesses Behavioral/Psych: Mood is stable, no new  changes  Extremities: No lower extremity edema All other systems were reviewed with the patient and are negative.  I have reviewed the past medical history, past surgical history, social history and family history with the patient and they are unchanged from previous note.  PHYSICAL EXAMINATION: ECOG PERFORMANCE STATUS: 3 - Symptomatic, >50% confined to bed  Vitals:   10/29/19 0654 10/29/19 0730  BP: (!) 151/90   Pulse: 75   Resp: 15   Temp: 97.8 F (36.6 C)   SpO2: 95% 92%   Filed Weights   09/30/2019 2057 11/16/2019 1126  Weight: 154 lb 5.2 oz (70 kg) 154 lb 5.2 oz (70 kg)    Intake/Output from previous day: 03/09 0701 - 03/10 0700 In: -  Out: 325 [Urine:325]  General: chronically ill-appearing male, no distress Eyes:  no scleral icterus.  Marland Kitchen   Respiratory: lungs were clear bilaterally without wheezing or crackles.   Cardiovascular:  regular rate and rhythm, S1/S2, without murmur, rub or gallop. There was no pedal edema.   GI:  abdomen was soft, flat, nontender, nondistended.   Skin: multiple bruises noted on his upper extremities, no petechiae Neuro exam was nonfocal.    Alert and oriented x3 this morning.  LABORATORY DATA:  I have reviewed the data as listed CMP Latest Ref Rng & Units 10/29/2019 10/28/2019 10/27/2019  Glucose 70 - 99 mg/dL 164(H) 160(H) 162(H)  BUN 8 - 23 mg/dL 26(H) 26(H) 25(H)  Creatinine 0.61 - 1.24 mg/dL 0.71 0.66 0.55(L)  Sodium 135 - 145 mmol/L 139 140 141  Potassium 3.5 - 5.1 mmol/L 3.0(L) 3.2(L) 3.5  Chloride 98 - 111 mmol/L 103 103 105  CO2 22 - 32 mmol/L 25 26 27   Calcium 8.9 - 10.3 mg/dL 7.9(L) 8.0(L)  8.2(L)  Total Protein 6.5 - 8.1 g/dL 5.4(L) 5.8(L) 5.7(L)  Total Bilirubin 0.3 - 1.2 mg/dL 1.3(H) 1.2 1.0  Alkaline Phos 38 - 126 U/L 412(H) 413(H) 360(H)  AST 15 - 41 U/L 144(H) 155(H) 113(H)  ALT 0 - 44 U/L 311(H) 349(H) 319(H)    Lab Results  Component Value Date   WBC 13.0 (H) 10/29/2019   HGB 11.0 (L) 10/29/2019   HCT 33.2 (L)  10/29/2019   MCV 95.7 10/29/2019   PLT 153 10/29/2019   NEUTROABS 12.0 (H) 10/25/2019    EEG  Result Date: 10/17/2019 Lora Havens, MD     10/17/2019  1:38 PM Patient Name: Drew Nguyen MRN: [id number] Epilepsy Attending: Lora Havens Referring Physician/Provider: Dr. Gean Birchwood Date: 10/17/2019 Duration: 25.26 minutes Patient history: 64 year old male with history of alcohol use, cocaine use who presented with generalized tonic-clonic seizure-like episode.  EEG to evaluate for seizures. Level of alertness: Awake AEDs during EEG study: Ativan Technical aspects: This EEG study was done with scalp electrodes positioned according to the 10-20 International system of electrode placement. Electrical activity was acquired at a sampling rate of 500Hz  and reviewed with a high frequency filter of 70Hz  and a low frequency filter of 1Hz . EEG data were recorded continuously and digitally stored. Description: The posterior dominant rhythm consists of 9-10 Hz activity of moderate voltage (25-35 uV) seen predominantly in posterior head regions, symmetric and reactive to eye opening and eye closing.  There is also 15 to 18 Hz beta activity  distributed symmetrically and diffusely.  Physiologic photic driving was seen during photic stimulation.  Hyperventilation was not performed. IMPRESSION: This study is within normal limits. No seizures or epileptiform discharges were seen throughout the recording. Lora Havens   DG Chest 2 View  Result Date: 10/25/2019 CLINICAL DATA:  Cough. Right-sided chest pain. Shortness of breath. EXAM: CHEST - 2 VIEW COMPARISON:  Radiograph CT 01/08/2020. Chest CT 10/17/2019 FINDINGS: Progressive volume loss in the left hemithorax with progressive opacification throughout, patient with known left hilar mass. This is likely increasing postobstructive atelectasis throughout the left lung. Difficult to exclude an element of pleural effusion. Right lung is slightly hyperinflated but  clear. Cardiomediastinal contours are obscured by left lung opacity. No evidence of right pulmonary edema. No visualized pneumothorax. IMPRESSION: Progressive volume loss in the left hemithorax with increased opacification, likely increasing postobstructive atelectasis throughout the left lung given history of left hilar mass and upper lobe collapse on recent CT. Difficult to exclude an element of pleural fluid on the left. Electronically Signed   By: Keith Rake M.D.   On: 10/25/2019 19:26   CT HEAD WO CONTRAST  Result Date: 10/25/2019 CLINICAL DATA:  Slurred speech, history of CVAs, neuro deficit, acute, stroke suspected. EXAM: CT HEAD WITHOUT CONTRAST TECHNIQUE: Contiguous axial images were obtained from the base of the skull through the vertex without intravenous contrast. COMPARISON:  Brain MRI 10/17/2019, head CT 10/02/2019. FINDINGS: Brain: There is no evidence of acute intracranial hemorrhage, intracranial mass, midline shift or extra-axial fluid collection.No demarcated cortical infarction. Redemonstrated chronic lacunar infarcts within the bilateral corona radiata/basal ganglia. Background ill-defined hypoattenuation within the cerebral white matter is nonspecific, but consistent with chronic small vessel ischemic disease. Stable, mild generalized parenchymal atrophy. Vascular: No hyperdense vessel.  Atherosclerotic calcifications. Skull: Normal. Negative for fracture or focal lesion. Sinuses/Orbits: Visualized orbits demonstrate no acute abnormality. No significant paranasal sinus disease or mastoid effusion at the imaged levels. IMPRESSION: No evidence of acute intracranial  abnormality. Redemonstrated chronic bilateral corona radiata/basal ganglia lacunar infarcts. Stable background generalized parenchymal atrophy and chronic small vessel ischemic disease. Electronically Signed   By: Kellie Simmering DO   On: 10/25/2019 18:33   CT Head Wo Contrast  Result Date: 09/26/2019 CLINICAL DATA:  Altered  level of consciousness, seizure EXAM: CT HEAD WITHOUT CONTRAST TECHNIQUE: Contiguous axial images were obtained from the base of the skull through the vertex without intravenous contrast. COMPARISON:  06/05/2019, 06/06/2019 FINDINGS: Brain: There are chronic small vessel ischemic changes seen within the bilateral basal ganglia and periventricular white matter. No acute infarct or hemorrhage. Lateral ventricles and remaining midline structures are unremarkable. No acute extra-axial fluid collections. No mass effect. Vascular: No hyperdense vessel or unexpected calcification. Skull: Normal. Negative for fracture or focal lesion. Sinuses/Orbits: No acute finding. Other: None IMPRESSION: 1. Chronic small vessel ischemic changes, no acute intracranial process. Electronically Signed   By: Randa Ngo M.D.   On: 09/30/2019 22:54   CT CHEST W CONTRAST  Result Date: 10/17/2019 CLINICAL DATA:  Abnormal chest x-ray, pulmonary nodule, seizure, altered level of consciousness EXAM: CT CHEST WITH CONTRAST TECHNIQUE: Multidetector CT imaging of the chest was performed during intravenous contrast administration. CONTRAST:  75mL OMNIPAQUE IOHEXOL 300 MG/ML  SOLN COMPARISON:  10/12/2019, 03/04/2019 FINDINGS: Cardiovascular: The heart is unremarkable without pericardial effusion. Thoracic aorta is normal in caliber without aneurysm or dissection. No filling defects or pulmonary emboli. Extrinsic compression upon the left pulmonary artery due to left hilar mass. Moderate atherosclerosis of the coronary vasculature, with mild calcified plaque of the aortic arch. Mediastinum/Nodes: There is a lobular soft tissue mass in the mediastinum and AP window, measuring 4.7 x 5.3 cm. Large left hilar mass with obstruction of the left upper lobe bronchus. No contralateral hilar adenopathy. There is circumferential wall thickening of the distal thoracic esophagus, nonspecific. Endoscopy may be useful. Lungs/Pleura: There is complete collapse  of the left upper lobe due to a large left hilar mass causing abrupt cut off of the left upper lobe bronchus. Distinguishing the left hilar mass from the consolidated left upper lobe is difficult. PET scan recommended for further evaluation. Background emphysema. Minimal tree in bud ground-glass airspace disease within the left lower lobe likely inflammatory or infectious. There is narrowing of the left lower lobe bronchus with mucoid material filling the posterior basilar segmental bronchi. No effusion or pneumothorax. Upper Abdomen: Thickening of the left adrenal gland measuring 12 mm, suspicious for metastases. Remainder of the upper abdomen is unremarkable. Musculoskeletal: No acute or destructive bony lesions. Reconstructed images demonstrate no additional findings. IMPRESSION: 1. 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 This mass results in extrinsic compression of the left upper lobe pulmonary artery, as well as complete obstruction of the left upper lobe bronchus and complete left upper lobe atelectasis. 2. Circumferential mural thickening of the distal thoracic esophagus. Endoscopy recommended for further evaluation. 3. Minimal left lower lobe tree-in-bud ground-glass airspace disease consistent with inflammation or infection. 4. Left adrenal thickening, nonspecific. Metastatic disease not excluded. 5.  Emphysema (ICD10-J43.9). Electronically Signed   By: Randa Ngo M.D.   On: 10/17/2019 02:01   MR BRAIN W WO CONTRAST  Result Date: 10/17/2019 CLINICAL DATA:  Seizure. Abnormal neuro exam. Hypertension. Patient stopped drinking daily alcohol 1 week ago. Confusion. EXAM: MRI HEAD WITHOUT AND WITH CONTRAST TECHNIQUE: Multiplanar, multiecho pulse sequences of the brain and surrounding structures were obtained without and with intravenous contrast. CONTRAST:  64mL GADAVIST GADOBUTROL  1 MMOL/ML IV SOLN COMPARISON:  CT head without contrast 10/10/2019.  MRI of the head 04/19/2019 FINDINGS: Brain: The diffusion-weighted images demonstrate no acute or subacute infarct. Remote lacunar infarcts are present within the basal ganglia extending superiorly into the corona radiata bilaterally. Mild atrophy and white matter disease is present bilaterally, otherwise stable. The basal ganglia are otherwise unremarkable. Insular ribbon is within limits bilaterally. Study is mildly degraded by patient motion. Temporal lobes are normal. The internal auditory canals are normal bilaterally. The brainstem and cerebellum are normal. Postcontrast images demonstrate no pathologic enhancement. Vascular: Flow is present in the major intracranial arteries. Skull and upper cervical spine: The craniocervical junction is normal. Upper cervical spine is within normal limits. Marrow signal is unremarkable. Sinuses/Orbits: The paranasal sinuses and mastoid air cells are clear. The globes and orbits are within normal limits. IMPRESSION: 1. Remote nonhemorrhagic lacunar infarcts of the basal ganglia bilaterally extending into the corona radiata. 2. Atrophy and white matter disease are mildly advanced for age. 3. No acute or focal intracranial abnormality to explain seizures or confusion. Electronically Signed   By: San Morelle M.D.   On: 10/17/2019 05:30   CT ABDOMEN PELVIS W CONTRAST  Result Date: 10/22/2019 CLINICAL DATA:  64 year old male with history of adrenal mass suspicious for malignancy. Follow-up study. EXAM: CT ABDOMEN AND PELVIS WITH CONTRAST TECHNIQUE: Multidetector CT imaging of the abdomen and pelvis was performed using the standard protocol following bolus administration of intravenous contrast. CONTRAST:  158mL OMNIPAQUE IOHEXOL 300 MG/ML  SOLN COMPARISON:  No priors. FINDINGS: Lower chest: Atherosclerotic calcifications in the left circumflex and right coronary arteries. Hepatobiliary: Multiple ill-defined hypovascular lesions are scattered throughout the hepatic  parenchyma, highly concerning for widespread metastatic disease. One specific example is the lesion at the junction of segment 8 and the caudate lobe (axial image 19 of series 3) measuring 3.2 x 2.1 cm. No intra or extrahepatic biliary ductal dilatation. Gallbladder is nearly completely collapsed with avid enhancement of the gallbladder mucosa and diffuse wall edema, without surrounding inflammatory changes. Pancreas: No pancreatic mass. No pancreatic ductal dilatation. No pancreatic or peripancreatic fluid collections or inflammatory changes. Spleen: Unremarkable. Adrenals/Urinary Tract: Subcentimeter low-attenuation lesions in both kidneys, too small to characterize, but statistically likely to represent cysts. No hydroureteronephrosis. Urinary bladder is normal in appearance. Right adrenal gland is normal. 1.2 cm left adrenal nodule (axial image 23 of series 3), similar to the prior chest CT 10/06/2019. Stomach/Bowel: Normal appearance of the stomach. No pathologic dilatation of small bowel or colon. Numerous colonic diverticulae are noted, without surrounding inflammatory changes to suggest an acute diverticulitis at this time. Normal appendix. Vascular/Lymphatic: Aortic atherosclerosis, without evidence of aneurysm or dissection in the abdominal or pelvic vasculature. No lymphadenopathy noted in the abdomen or pelvis. Reproductive: Prostate gland and seminal vesicles are unremarkable in appearance. Other: No significant volume of ascites.  No pneumoperitoneum. Musculoskeletal: There are no aggressive appearing lytic or blastic lesions noted in the visualized portions of the skeleton. IMPRESSION: 1. Small left adrenal nodule is nonspecific. This could either be a benign or malignant lesion. 2. Importantly, there are innumerable hypovascular areas in the liver which are highly suspicious for metastatic disease. This could be better evaluated with follow-up abdominal MRI with and without IV gadolinium. 3. Colonic  diverticulosis without evidence of acute diverticulitis at this time. 4. Additional incidental findings, as above. Electronically Signed   By: Vinnie Langton M.D.   On: 10/22/2019 16:40   MR LIVER W WO CONTRAST  Result  Date: 10/24/2019 CLINICAL DATA:  Indeterminate liver lesions on recent CT. Recently diagnosed lung carcinoma. EXAM: MRI ABDOMEN WITHOUT AND WITH CONTRAST TECHNIQUE: Multiplanar multisequence MR imaging of the abdomen was performed both before and after the administration of intravenous contrast. CONTRAST:  67mL GADAVIST GADOBUTROL 1 MMOL/ML IV SOLN COMPARISON:  CT on 10/22/2019 FINDINGS: Lower chest: No acute findings. Hepatobiliary: Innumerable small hypovascular masses are seen throughout the liver, consistent with diffuse liver metastases. Gallbladder is nearly completely collapsed. No evidence of biliary ductal dilatation. Pancreas:  No mass or inflammatory changes. Spleen:  Within normal limits in size and appearance. Adrenals/Urinary Tract: No masses identified. Small bilateral renal cysts noted. No evidence of hydronephrosis. Stomach/Bowel: Visualized portion unremarkable. Vascular/Lymphatic: Mild lymphadenopathy in porta hepatis measuring 1.9 cm short axis. No abdominal aortic aneurysm. Other:  None. Musculoskeletal: Diffuse enhancing T2 hyperintense bone lesions throughout the visualized portion of the thoracolumbar spine and pelvis, consistent with diffuse bone metastases. IMPRESSION: Diffuse liver metastases. Diffuse bone metastases. Mild abdominal lymphadenopathy in porta hepatis, consistent with metastatic disease. Electronically Signed   By: Marlaine Hind M.D.   On: 10/24/2019 19:23   DG Chest Portable 1 View  Result Date: 10/05/2019 CLINICAL DATA:  Seizure, altered mental status EXAM: PORTABLE CHEST 1 VIEW COMPARISON:  06/06/2019 FINDINGS: Right lung is grossly clear. Asymmetric hazy opacification in the left thorax with increased left hilar density. Heart size upper limits of  normal. No pneumothorax. IMPRESSION: Asymmetric hazy opacity in left thorax with asymmetric density in the left hilus, could be due to diffuse lung parenchymal air disease versus lung mass. Chest CT is recommended for further evaluation. Electronically Signed   By: Donavan Foil M.D.   On: 10/19/2019 22:42   [place] EKG SITE RITE  Result Date: 10/28/2019 If Site Rite image not attached, placement could not be confirmed due to current cardiac rhythm.

## 2019-10-29 NOTE — Progress Notes (Signed)
I spoke with Dr Maylon Peppers.  Ok to proceed with chemo with this am labs.  Etoposide dose adjusted for elevated LFTs.

## 2019-10-29 NOTE — Progress Notes (Signed)
Chemotherapy dosage and calculations verified with Laural Benes, RN.

## 2019-10-29 NOTE — Progress Notes (Signed)
Chemotherapy education binder provided to patient and spouse. 'Chemotherapy and You' book given as well as handouts for chemotherapy drugs to be received. Pt and spouse encouraged to ask questions as they arise. No immediate questions or concerns. Consent signed and placed in chart

## 2019-10-30 ENCOUNTER — Ambulatory Visit
Admit: 2019-10-30 | Discharge: 2019-10-30 | Disposition: A | Discharge status: Home / Self Care | Payer: Medicaid Other | Attending: Radiation Oncology | Admitting: Radiation Oncology

## 2019-10-30 ENCOUNTER — Other Ambulatory Visit: Payer: Self-pay | Admitting: *Deleted

## 2019-10-30 LAB — CBC
HCT: 35.2 % — ABNORMAL LOW (ref 39.0–52.0)
Hemoglobin: 11.8 g/dL — ABNORMAL LOW (ref 13.0–17.0)
MCH: 31.7 pg (ref 26.0–34.0)
MCHC: 33.5 g/dL (ref 30.0–36.0)
MCV: 94.6 fL (ref 80.0–100.0)
Platelets: 142 10*3/uL — ABNORMAL LOW (ref 150–400)
RBC: 3.72 MIL/uL — ABNORMAL LOW (ref 4.22–5.81)
RDW: 13 % (ref 11.5–15.5)
WBC: 11.1 10*3/uL — ABNORMAL HIGH (ref 4.0–10.5)
nRBC: 0.3 % — ABNORMAL HIGH (ref 0.0–0.2)

## 2019-10-30 LAB — COMPREHENSIVE METABOLIC PANEL
ALT: 338 U/L — ABNORMAL HIGH (ref 0–44)
AST: 141 U/L — ABNORMAL HIGH (ref 15–41)
Albumin: 2.8 g/dL — ABNORMAL LOW (ref 3.5–5.0)
Alkaline Phosphatase: 456 U/L — ABNORMAL HIGH (ref 38–126)
Anion gap: 9 (ref 5–15)
BUN: 23 mg/dL (ref 8–23)
CO2: 28 mmol/L (ref 22–32)
Calcium: 8.2 mg/dL — ABNORMAL LOW (ref 8.9–10.3)
Chloride: 103 mmol/L (ref 98–111)
Creatinine, Ser: 0.56 mg/dL — ABNORMAL LOW (ref 0.61–1.24)
GFR calc Af Amer: >60 mL/min (ref >60)
GFR calc non Af Amer: >60 mL/min (ref >60)
Glucose, Bld: 186 mg/dL — ABNORMAL HIGH (ref 70–99)
Potassium: 2.7 mmol/L — CL (ref 3.5–5.1)
Sodium: 140 mmol/L (ref 135–145)
Total Bilirubin: 1.4 mg/dL — ABNORMAL HIGH (ref 0.3–1.2)
Total Protein: 5.7 g/dL — ABNORMAL LOW (ref 6.5–8.1)

## 2019-10-30 MED ORDER — SODIUM CHLORIDE 0.9 % IV SOLN
10.0000 mg | Freq: Once | INTRAVENOUS | Status: AC
  Administered 2019-10-30: 10 mg via INTRAVENOUS
  Filled 2019-10-30: qty 1

## 2019-10-30 MED ORDER — ASPIRIN EC 81 MG PO TBEC
81.0000 mg | DELAYED_RELEASE_TABLET | Freq: Every day | ORAL | Status: DC
  Administered 2019-10-30 – 2019-11-01 (×3): 81 mg via ORAL
  Filled 2019-10-30 (×3): qty 1

## 2019-10-30 MED ORDER — ASPIRIN 81 MG PO TBEC
81.0000 mg | DELAYED_RELEASE_TABLET | Freq: Every day | ORAL | Status: DC
  Filled 2019-10-30: qty 1

## 2019-10-30 MED ORDER — POTASSIUM CHLORIDE 10 MEQ/100ML IV SOLN
10.0000 meq | INTRAVENOUS | Status: AC
  Administered 2019-10-30 (×4): 10 meq via INTRAVENOUS
  Filled 2019-10-30 (×2): qty 100

## 2019-10-30 MED ORDER — SODIUM CHLORIDE 0.9 % IV SOLN
60.0000 mg/m2 | Freq: Once | INTRAVENOUS | Status: AC
  Administered 2019-10-30: 110 mg via INTRAVENOUS
  Filled 2019-10-30: qty 5.5

## 2019-10-30 NOTE — Progress Notes (Signed)
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  MRI showed remote lacunar infarct without acute abnormality.  Later EEG was reportedly normal.  Chest x-ray was abnormal and subsequent CT chest demonstrated a hilar mass with associated adenopathy and compression of left upper lobe bronchus and pulmonary artery with distal atelectasis.  This also showed distal esophageal thickening and nonspecific left adrenal thickening.  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  CT abdomen and pelvis performed to follow-up left adrenal finding revealed a nonspecific nodule with innumerable hypovascular area in the liver confirmed on subsequent MRI, which also showed diffuse enhancing of T2 hyperintense bone lesions throughout the visualized portion of the thoracolumbar spine and pelvis, consistent with diffuse bone metastasis.   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 Appreciate oncology consultation, plan is to initiate chemotherapy on 3/10  until 3/12 as an inpatient Radiation oncology guiding radiation therapy initiated as an inpatient Palliative care team also consulted and following Plan to check Mg, Phosphorus, LDH level.   2-Hepatic metastasis with hepatic encephalopathy: Liver function tests continue to be elevated Hepatitis panel negative Continue with lactulose Delirium has improved  LUL postobstructive CAP: Completed doxycycline from 12/26 until 3/2  Bleeding at Lovenox injection site: resolved Apply pressure.  Lovenox discontinued Hemoglobin has remained stable  Hyponatremia possible due to Potomania.  resolved  Generalized tonic-clonic seizure acute toxic metabolic encephalopathy: EEG negative Neurology has signed off  Hypokalemia: Continue with potassium supplementation, BID>   History of recurrent CVA Plavix held around time of biopsy.  Plan to resume Plavix and aspirin  today  Hypertension: With hypertensive urgency: Continue with  losartan and Coreg, BiDil  RA;  Tapering from a stress dose of steroid not on prednisone Usually takes 15 mg daily Holding Plaquenil and Enbrel  Polysubstance abuse, alcohol abuse tobacco abuse: Counseling provided  COPD no exacerbation Esophageal thickening:  Slurred speech: Multifactorial most likely due to encephalopathy/possibly paraneoplastic, dry mouth causing dysarthria. Subtle right-sided lower facial weakness is only focal deficit on exam. CT head is without acute stroke.  - Continue augmented lactulose Tx.  - Supportive care otherwise. Seems improved today. If continues and/or waxes/wanes, consider changing risperdal and other psychoactive medications.  Esophageal thickening: Appreciate GI consultation, patient was a started on PPI twice daily and nystatin for oral thrush  Depression: Continue with Celexa and risperidone  FTT,: Require skilled nursing facility for admission    Estimated body mass index is 24.37 kg/m as calculated from the  following:   Height  as of this encounter: 5\' 6"  (1.676 m).   Weight as of this encounter: 68.5 kg.   DVT prophylaxis: SCDs Code Status: Full code Family Communication: Care discussed with patient directly Disposition Plan:  Patient is from: Home Anticipated d/c date: In 2 to 3 days if he is able to tolerate chemotherapy Barriers to d/c or necessity for inpatient status: Plan to start with chemotherapy today  Consultants:   Oncology  Neurology  PCCM  Radiation oncology  Palliative care  GI  Procedures:  -EEG -Flexible video fiberoptic bronchoscopy with endobronchial ultrasound and biopsies on March 2 by Dr. Shearon Stalls  Antimicrobials:  Doxycycline from 2/26--3/2  Subjective: Alert, denies worsening dyspnea.  Tolerating chemo  Objective: Vitals:   10/30/19 0503 10/30/19 0700 10/30/19 0750 10/30/19 1220  BP: (!) 173/89   (!) 153/85  Pulse: 91   84  Resp: 15   18  Temp: 97.9 F (36.6 C)   97.6 F (36.4 C)  TempSrc: Oral   Oral  SpO2: 92%  94% 96%  Weight:  68.5 kg    Height:        Intake/Output Summary (Last 24 hours) at 10/30/2019 1521 Last data filed at 10/30/2019 0900 Gross per 24 hour  Intake 1526.07 ml  Output 175 ml  Net 1351.07 ml   Filed Weights   09/24/2019 2057 11/12/2019 1126 10/30/19 0700  Weight: 70 kg 70 kg 68.5 kg    Examination:  General exam: NAD Respiratory system: CTA Cardiovascular system: S 1, S 2 RRR Gastrointestinal system:BS present, soft, nt Central nervous system: alert Extremities: Symmetric power.  Skin: No rashes  Data Reviewed: I have personally reviewed following labs and imaging studies  CBC: Recent Labs  Lab 10/24/19 0811 10/24/19 0811 10/25/19 0545 10/25/19 0545 10/26/19 0548 10/27/19 0557 10/28/19 0608 10/29/19 0530 10/30/19 0443  WBC 15.8*   < > 14.2*   < > 14.2* 14.0* 13.6* 13.0* 11.1*  NEUTROABS 13.2*  --  12.0*  --   --   --   --   --   --   HGB 12.5*   < > 12.5*   < > 12.3* 10.8* 11.1* 11.0* 11.8*   HCT 36.7*   < > 37.6*   < > 37.8* 32.9* 33.2* 33.2* 35.2*  MCV 93.6   < > 95.7   < > 96.2 96.2 95.4 95.7 94.6  PLT 189   < > 166   < > 159 153 149* 153 142*   < > = values in this interval not displayed.   Basic Metabolic Panel: Recent Labs  Lab 10/24/19 0811 10/24/19 2094 10/25/19 0545 10/25/19 0545 10/26/19 0548 10/27/19 0557 10/28/19 0608 10/29/19 0530 10/30/19 0443  NA 141   < > 140   < > 141 141 140 139 140  K 3.0*   < > 2.4*   < > 2.9* 3.5 3.2* 3.0* 2.7*  CL 102   < > 103   < > 103 105 103 103 103  CO2 25   < > 27   < > 26 27 26 25 28   GLUCOSE 165*   < > 153*   < > 228* 162* 160* 164* 186*  BUN 13   < > 17   < > 24* 25* 26* 26* 23  CREATININE 0.73   < > 0.52*   < > 0.61 0.55* 0.66 0.71 0.56*  CALCIUM 8.2*   < > 8.2*   < > 8.6* 8.2* 8.0* 7.9* 8.2*  MG  1.8   < > 1.8  --  1.8 2.1 2.0 1.9  --   PHOS 2.5  --  2.7  --   --   --   --   --   --    < > = values in this interval not displayed.   GFR: Estimated Creatinine Clearance: 84.2 mL/min (A) (by C-G formula based on SCr of 0.56 mg/dL (L)). Liver Function Tests: Recent Labs  Lab 10/26/19 0548 10/27/19 0557 10/28/19 0608 10/29/19 0530 10/30/19 0443  AST 104* 113* 155* 144* 141*  ALT 375* 319* 349* 311* 338*  ALKPHOS 376* 360* 413* 412* 456*  BILITOT 0.8 1.0 1.2 1.3* 1.4*  PROT 5.8* 5.7* 5.8* 5.4* 5.7*  ALBUMIN 3.0* 2.9* 3.1* 2.7* 2.8*   No results for input(s): LIPASE, AMYLASE in the last 168 hours. Recent Labs  Lab 10/24/19 0811  AMMONIA 52*   Coagulation Profile: Recent Labs  Lab 10/26/19 0548  INR 1.0   Cardiac Enzymes: No results for input(s): CKTOTAL, CKMB, CKMBINDEX, TROPONINI in the last 168 hours. BNP (last 3 results) No results for input(s): PROBNP in the last 8760 hours. HbA1C: No results for input(s): HGBA1C in the last 72 hours. CBG: No results for input(s): GLUCAP in the last 168 hours. Lipid Profile: No results for input(s): CHOL, HDL, LDLCALC, TRIG, CHOLHDL, LDLDIRECT in the last 72  hours. Thyroid Function Tests: No results for input(s): TSH, T4TOTAL, FREET4, T3FREE, THYROIDAB in the last 72 hours. Anemia Panel: No results for input(s): VITAMINB12, FOLATE, FERRITIN, TIBC, IRON, RETICCTPCT in the last 72 hours. Sepsis Labs: No results for input(s): PROCALCITON, LATICACIDVEN in the last 168 hours.  No results found for this or any previous visit (from the past 240 hour(s)).       Radiology Studies: No results found.      Scheduled Meds: . amLODipine  10 mg Oral Daily  . aspirin EC  81 mg Oral Daily  . carvedilol  12.5 mg Oral BID WC  . Chlorhexidine Gluconate Cloth  6 each Topical Daily  . clopidogrel  75 mg Oral Daily  . feeding supplement (ENSURE ENLIVE)  237 mL Oral BID BM  . isosorbide-hydrALAZINE  1 tablet Oral TID  . lactulose  30 g Oral BID  . lidocaine  15 mL Mouth/Throat TID AC  . losartan  50 mg Oral Daily  . mometasone-formoterol  2 puff Inhalation BID  . nystatin  5 mL Oral QID  . pantoprazole  40 mg Oral BID  . potassium & sodium phosphates  1 packet Oral TID WC & HS  . potassium chloride  40 mEq Oral BID  . predniSONE  20 mg Oral QAC breakfast  . risperiDONE  1 mg Oral QHS  . senna-docusate  1 tablet Oral QHS  . sodium chloride flush  10-40 mL Intracatheter Q12H  . [START ON 11/01/2019] Tbo-Filgrastim  300 mcg Subcutaneous Daily  . traZODone  50 mg Oral QHS  . umeclidinium bromide  1 puff Inhalation Daily   Continuous Infusions: . sodium chloride    . sodium chloride    . famotidine (PEPCID) IV (ONCOLOGY)       LOS: 13 days    Time spent: 35 minutes.     Elmarie Shiley, MD [facility]   If 7PM-7AM, please contact night-coverage [email or web address]  10/30/2019, 3:21 PM

## 2019-10-30 NOTE — Progress Notes (Signed)
communicated with Erasmo Downer and Dr. Maylon Peppers regarding pt this AM and both Ok to treat today

## 2019-10-30 NOTE — Progress Notes (Signed)
PT Cancellation Note  Patient Details Name: Drew Nguyen MRN: [id number] DOB: Jan 15, 1956   Cancelled Treatment:    Reason Eval/Treat Not Completed: Medical issues which prohibited therapy;Other (comment)(Leaving the floor for radiation at this time (1320)- not available for PT today.) Rollen Sox, PT # [phone] CGV cell  Casandra Doffing 10/30/2019, 1:25 PM

## 2019-10-30 NOTE — Progress Notes (Signed)
The proposed treatment discussed in cancer conference 10/30/19 is for discussion purpose only and is not a binding recommendation.  The patient was not physically examined nor present for their treatment options.  Therefore, final treatment plans cannot be decided.  

## 2019-10-30 NOTE — Progress Notes (Signed)
HEMATOLOGY-ONCOLOGY PROGRESS NOTE  SUBJECTIVE: Awake and eating breakfast this morning.  Reports some mild shortness of breath.  He tolerated day 1 of chemotherapy well overall and denies mucositis, nausea, vomiting.  He has no other concerns this morning.  REVIEW OF SYSTEMS:   Constitutional: Denies fevers, chills or abnormal weight loss Eyes: Denies blurriness of vision Ears, nose, mouth, throat, and face: Denies mucositis or sore throat Respiratory: Reports some mild shortness of breath Cardiovascular: Denies palpitation, chest discomfort Gastrointestinal:  Denies nausea, heartburn or change in bowel habits Skin: Denies abnormal skin rashes Lymphatics: Denies new lymphadenopathy or easy bruising Neurological:Denies numbness, tingling or new weaknesses Behavioral/Psych: Mood is stable, no new changes  Extremities: No lower extremity edema All other systems were reviewed with the patient and are negative.  I have reviewed the past medical history, past surgical history, social history and family history with the patient and they are unchanged from previous note.   PHYSICAL EXAMINATION: ECOG PERFORMANCE STATUS: 3 - Symptomatic, >50% confined to bed  Vitals:   10/30/19 0503 10/30/19 0750  BP: (!) 173/89   Pulse: 91   Resp: 15   Temp: 97.9 F (36.6 C)   SpO2: 92% 94%   Filed Weights   10/03/2019 2057 10/20/2019 1126 10/30/19 0700  Weight: 70 kg 70 kg 68.5 kg    Intake/Output from previous day: 03/10 0701 - 03/11 0700 In: 1286.1 [P.O.:355; IV Piggyback:931.1] Out: 325 [Urine:325]  General: Chronically ill-appearing male, no distress Eyes:  no scleral icterus.  Marland Kitchen   Respiratory: lungs were clear bilaterally without wheezing or crackles.   Cardiovascular:  Regular rate and rhythm, S1/S2, without murmur, rub or gallop. There was no pedal edema.   GI:  abdomen was soft, flat, nontender, nondistended, without organomegaly.     Skin: Multiple bruises noted on his upper extremities,  no petechiae MSK: Strength 4/5 in the bilateral lower extremities. Neuro exam was nonfocal.    Alert and oriented x3 this morning.  LABORATORY DATA:  I have reviewed the data as listed CMP Latest Ref Rng & Units 10/30/2019 10/29/2019 10/28/2019  Glucose 70 - 99 mg/dL 186(H) 164(H) 160(H)  BUN 8 - 23 mg/dL 23 26(H) 26(H)  Creatinine 0.61 - 1.24 mg/dL 0.56(L) 0.71 0.66  Sodium 135 - 145 mmol/L 140 139 140  Potassium 3.5 - 5.1 mmol/L 2.7(LL) 3.0(L) 3.2(L)  Chloride 98 - 111 mmol/L 103 103 103  CO2 22 - 32 mmol/L 28 25 26   Calcium 8.9 - 10.3 mg/dL 8.2(L) 7.9(L) 8.0(L)  Total Protein 6.5 - 8.1 g/dL 5.7(L) 5.4(L) 5.8(L)  Total Bilirubin 0.3 - 1.2 mg/dL 1.4(H) 1.3(H) 1.2  Alkaline Phos 38 - 126 U/L 456(H) 412(H) 413(H)  AST 15 - 41 U/L 141(H) 144(H) 155(H)  ALT 0 - 44 U/L 338(H) 311(H) 349(H)    Lab Results  Component Value Date   WBC 11.1 (H) 10/30/2019   HGB 11.8 (L) 10/30/2019   HCT 35.2 (L) 10/30/2019   MCV 94.6 10/30/2019   PLT 142 (L) 10/30/2019   NEUTROABS 12.0 (H) 10/25/2019    EEG  Result Date: 10/17/2019 Lora Havens, MD     10/17/2019  1:38 PM Patient Name: ECHO ALLSBROOK MRN: [id number] Epilepsy Attending: Lora Havens Referring Physician/Provider: Dr. Gean Birchwood Date: 10/17/2019 Duration: 25.26 minutes Patient history: 64 year old male with history of alcohol use, cocaine use who presented with generalized tonic-clonic seizure-like episode.  EEG to evaluate for seizures. Level of alertness: Awake AEDs during EEG study: Ativan Technical aspects: This  EEG study was done with scalp electrodes positioned according to the 10-20 International system of electrode placement. Electrical activity was acquired at a sampling rate of 500Hz  and reviewed with a high frequency filter of 70Hz  and a low frequency filter of 1Hz . EEG data were recorded continuously and digitally stored. Description: The posterior dominant rhythm consists of 9-10 Hz activity of moderate voltage (25-35 uV)  seen predominantly in posterior head regions, symmetric and reactive to eye opening and eye closing.  There is also 15 to 18 Hz beta activity  distributed symmetrically and diffusely.  Physiologic photic driving was seen during photic stimulation.  Hyperventilation was not performed. IMPRESSION: This study is within normal limits. No seizures or epileptiform discharges were seen throughout the recording. Lora Havens   DG Chest 2 View  Result Date: 10/25/2019 CLINICAL DATA:  Cough. Right-sided chest pain. Shortness of breath. EXAM: CHEST - 2 VIEW COMPARISON:  Radiograph CT 01/08/2020. Chest CT 10/17/2019 FINDINGS: Progressive volume loss in the left hemithorax with progressive opacification throughout, patient with known left hilar mass. This is likely increasing postobstructive atelectasis throughout the left lung. Difficult to exclude an element of pleural effusion. Right lung is slightly hyperinflated but clear. Cardiomediastinal contours are obscured by left lung opacity. No evidence of right pulmonary edema. No visualized pneumothorax. IMPRESSION: Progressive volume loss in the left hemithorax with increased opacification, likely increasing postobstructive atelectasis throughout the left lung given history of left hilar mass and upper lobe collapse on recent CT. Difficult to exclude an element of pleural fluid on the left. Electronically Signed   By: Keith Rake M.D.   On: 10/25/2019 19:26   CT HEAD WO CONTRAST  Result Date: 10/25/2019 CLINICAL DATA:  Slurred speech, history of CVAs, neuro deficit, acute, stroke suspected. EXAM: CT HEAD WITHOUT CONTRAST TECHNIQUE: Contiguous axial images were obtained from the base of the skull through the vertex without intravenous contrast. COMPARISON:  Brain MRI 10/17/2019, head CT 10/15/2019. FINDINGS: Brain: There is no evidence of acute intracranial hemorrhage, intracranial mass, midline shift or extra-axial fluid collection.No demarcated cortical  infarction. Redemonstrated chronic lacunar infarcts within the bilateral corona radiata/basal ganglia. Background ill-defined hypoattenuation within the cerebral white matter is nonspecific, but consistent with chronic small vessel ischemic disease. Stable, mild generalized parenchymal atrophy. Vascular: No hyperdense vessel.  Atherosclerotic calcifications. Skull: Normal. Negative for fracture or focal lesion. Sinuses/Orbits: Visualized orbits demonstrate no acute abnormality. No significant paranasal sinus disease or mastoid effusion at the imaged levels. IMPRESSION: No evidence of acute intracranial abnormality. Redemonstrated chronic bilateral corona radiata/basal ganglia lacunar infarcts. Stable background generalized parenchymal atrophy and chronic small vessel ischemic disease. Electronically Signed   By: Kellie Simmering DO   On: 10/25/2019 18:33   CT Head Wo Contrast  Result Date: 10/15/2019 CLINICAL DATA:  Altered level of consciousness, seizure EXAM: CT HEAD WITHOUT CONTRAST TECHNIQUE: Contiguous axial images were obtained from the base of the skull through the vertex without intravenous contrast. COMPARISON:  06/05/2019, 06/06/2019 FINDINGS: Brain: There are chronic small vessel ischemic changes seen within the bilateral basal ganglia and periventricular white matter. No acute infarct or hemorrhage. Lateral ventricles and remaining midline structures are unremarkable. No acute extra-axial fluid collections. No mass effect. Vascular: No hyperdense vessel or unexpected calcification. Skull: Normal. Negative for fracture or focal lesion. Sinuses/Orbits: No acute finding. Other: None IMPRESSION: 1. Chronic small vessel ischemic changes, no acute intracranial process. Electronically Signed   By: Randa Ngo M.D.   On: 09/27/2019 22:54   CT CHEST W  CONTRAST  Result Date: 10/17/2019 CLINICAL DATA:  Abnormal chest x-ray, pulmonary nodule, seizure, altered level of consciousness EXAM: CT CHEST WITH  CONTRAST TECHNIQUE: Multidetector CT imaging of the chest was performed during intravenous contrast administration. CONTRAST:  54mL OMNIPAQUE IOHEXOL 300 MG/ML  SOLN COMPARISON:  10/07/2019, 03/04/2019 FINDINGS: Cardiovascular: The heart is unremarkable without pericardial effusion. Thoracic aorta is normal in caliber without aneurysm or dissection. No filling defects or pulmonary emboli. Extrinsic compression upon the left pulmonary artery due to left hilar mass. Moderate atherosclerosis of the coronary vasculature, with mild calcified plaque of the aortic arch. Mediastinum/Nodes: There is a lobular soft tissue mass in the mediastinum and AP window, measuring 4.7 x 5.3 cm. Large left hilar mass with obstruction of the left upper lobe bronchus. No contralateral hilar adenopathy. There is circumferential wall thickening of the distal thoracic esophagus, nonspecific. Endoscopy may be useful. Lungs/Pleura: There is complete collapse of the left upper lobe due to a large left hilar mass causing abrupt cut off of the left upper lobe bronchus. Distinguishing the left hilar mass from the consolidated left upper lobe is difficult. PET scan recommended for further evaluation. Background emphysema. Minimal tree in bud ground-glass airspace disease within the left lower lobe likely inflammatory or infectious. There is narrowing of the left lower lobe bronchus with mucoid material filling the posterior basilar segmental bronchi. No effusion or pneumothorax. Upper Abdomen: Thickening of the left adrenal gland measuring 12 mm, suspicious for metastases. Remainder of the upper abdomen is unremarkable. Musculoskeletal: No acute or destructive bony lesions. Reconstructed images demonstrate no additional findings. IMPRESSION: 1. 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 This mass results in extrinsic compression of the left upper lobe pulmonary artery, as well as complete  obstruction of the left upper lobe bronchus and complete left upper lobe atelectasis. 2. Circumferential mural thickening of the distal thoracic esophagus. Endoscopy recommended for further evaluation. 3. Minimal left lower lobe tree-in-bud ground-glass airspace disease consistent with inflammation or infection. 4. Left adrenal thickening, nonspecific. Metastatic disease not excluded. 5.  Emphysema (ICD10-J43.9). Electronically Signed   By: Randa Ngo M.D.   On: 10/17/2019 02:01   MR BRAIN W WO CONTRAST  Result Date: 10/17/2019 CLINICAL DATA:  Seizure. Abnormal neuro exam. Hypertension. Patient stopped drinking daily alcohol 1 week ago. Confusion. EXAM: MRI HEAD WITHOUT AND WITH CONTRAST TECHNIQUE: Multiplanar, multiecho pulse sequences of the brain and surrounding structures were obtained without and with intravenous contrast. CONTRAST:  88mL GADAVIST GADOBUTROL 1 MMOL/ML IV SOLN COMPARISON:  CT head without contrast 10/15/2019. MRI of the head 04/19/2019 FINDINGS: Brain: The diffusion-weighted images demonstrate no acute or subacute infarct. Remote lacunar infarcts are present within the basal ganglia extending superiorly into the corona radiata bilaterally. Mild atrophy and white matter disease is present bilaterally, otherwise stable. The basal ganglia are otherwise unremarkable. Insular ribbon is within limits bilaterally. Study is mildly degraded by patient motion. Temporal lobes are normal. The internal auditory canals are normal bilaterally. The brainstem and cerebellum are normal. Postcontrast images demonstrate no pathologic enhancement. Vascular: Flow is present in the major intracranial arteries. Skull and upper cervical spine: The craniocervical junction is normal. Upper cervical spine is within normal limits. Marrow signal is unremarkable. Sinuses/Orbits: The paranasal sinuses and mastoid air cells are clear. The globes and orbits are within normal limits. IMPRESSION: 1. Remote nonhemorrhagic  lacunar infarcts of the basal ganglia bilaterally extending into the corona radiata. 2. Atrophy and white matter disease are mildly advanced  for age. 3. No acute or focal intracranial abnormality to explain seizures or confusion. Electronically Signed   By: San Morelle M.D.   On: 10/17/2019 05:30   CT ABDOMEN PELVIS W CONTRAST  Result Date: 10/22/2019 CLINICAL DATA:  64 year old male with history of adrenal mass suspicious for malignancy. Follow-up study. EXAM: CT ABDOMEN AND PELVIS WITH CONTRAST TECHNIQUE: Multidetector CT imaging of the abdomen and pelvis was performed using the standard protocol following bolus administration of intravenous contrast. CONTRAST:  17mL OMNIPAQUE IOHEXOL 300 MG/ML  SOLN COMPARISON:  No priors. FINDINGS: Lower chest: Atherosclerotic calcifications in the left circumflex and right coronary arteries. Hepatobiliary: Multiple ill-defined hypovascular lesions are scattered throughout the hepatic parenchyma, highly concerning for widespread metastatic disease. One specific example is the lesion at the junction of segment 8 and the caudate lobe (axial image 19 of series 3) measuring 3.2 x 2.1 cm. No intra or extrahepatic biliary ductal dilatation. Gallbladder is nearly completely collapsed with avid enhancement of the gallbladder mucosa and diffuse wall edema, without surrounding inflammatory changes. Pancreas: No pancreatic mass. No pancreatic ductal dilatation. No pancreatic or peripancreatic fluid collections or inflammatory changes. Spleen: Unremarkable. Adrenals/Urinary Tract: Subcentimeter low-attenuation lesions in both kidneys, too small to characterize, but statistically likely to represent cysts. No hydroureteronephrosis. Urinary bladder is normal in appearance. Right adrenal gland is normal. 1.2 cm left adrenal nodule (axial image 23 of series 3), similar to the prior chest CT 10/06/2019. Stomach/Bowel: Normal appearance of the stomach. No pathologic dilatation of  small bowel or colon. Numerous colonic diverticulae are noted, without surrounding inflammatory changes to suggest an acute diverticulitis at this time. Normal appendix. Vascular/Lymphatic: Aortic atherosclerosis, without evidence of aneurysm or dissection in the abdominal or pelvic vasculature. No lymphadenopathy noted in the abdomen or pelvis. Reproductive: Prostate gland and seminal vesicles are unremarkable in appearance. Other: No significant volume of ascites.  No pneumoperitoneum. Musculoskeletal: There are no aggressive appearing lytic or blastic lesions noted in the visualized portions of the skeleton. IMPRESSION: 1. Small left adrenal nodule is nonspecific. This could either be a benign or malignant lesion. 2. Importantly, there are innumerable hypovascular areas in the liver which are highly suspicious for metastatic disease. This could be better evaluated with follow-up abdominal MRI with and without IV gadolinium. 3. Colonic diverticulosis without evidence of acute diverticulitis at this time. 4. Additional incidental findings, as above. Electronically Signed   By: Vinnie Langton M.D.   On: 10/22/2019 16:40   MR LIVER W WO CONTRAST  Result Date: 10/24/2019 CLINICAL DATA:  Indeterminate liver lesions on recent CT. Recently diagnosed lung carcinoma. EXAM: MRI ABDOMEN WITHOUT AND WITH CONTRAST TECHNIQUE: Multiplanar multisequence MR imaging of the abdomen was performed both before and after the administration of intravenous contrast. CONTRAST:  43mL GADAVIST GADOBUTROL 1 MMOL/ML IV SOLN COMPARISON:  CT on 10/22/2019 FINDINGS: Lower chest: No acute findings. Hepatobiliary: Innumerable small hypovascular masses are seen throughout the liver, consistent with diffuse liver metastases. Gallbladder is nearly completely collapsed. No evidence of biliary ductal dilatation. Pancreas:  No mass or inflammatory changes. Spleen:  Within normal limits in size and appearance. Adrenals/Urinary Tract: No masses  identified. Small bilateral renal cysts noted. No evidence of hydronephrosis. Stomach/Bowel: Visualized portion unremarkable. Vascular/Lymphatic: Mild lymphadenopathy in porta hepatis measuring 1.9 cm short axis. No abdominal aortic aneurysm. Other:  None. Musculoskeletal: Diffuse enhancing T2 hyperintense bone lesions throughout the visualized portion of the thoracolumbar spine and pelvis, consistent with diffuse bone metastases. IMPRESSION: Diffuse liver metastases. Diffuse bone metastases. Mild abdominal  lymphadenopathy in porta hepatis, consistent with metastatic disease. Electronically Signed   By: Marlaine Hind M.D.   On: 10/24/2019 19:23   DG Chest Portable 1 View  Result Date: 10/12/2019 CLINICAL DATA:  Seizure, altered mental status EXAM: PORTABLE CHEST 1 VIEW COMPARISON:  06/06/2019 FINDINGS: Right lung is grossly clear. Asymmetric hazy opacification in the left thorax with increased left hilar density. Heart size upper limits of normal. No pneumothorax. IMPRESSION: Asymmetric hazy opacity in left thorax with asymmetric density in the left hilus, could be due to diffuse lung parenchymal air disease versus lung mass. Chest CT is recommended for further evaluation. Electronically Signed   By: Donavan Foil M.D.   On: 09/24/2019 22:42   [place] EKG SITE RITE  Result Date: 10/28/2019 If Site Rite image not attached, placement could not be confirmed due to current cardiac rhythm.   ASSESSMENT AND PLAN: Metastatic small cell lung cancer -Left hilar mass with mediastinal lymph hilar adenopathy, extrinsic compression of the left upper lobe pulmonary artery and complete obstruction of the left upper lobe bronchus noted on CT scan of the chest on 10/17/2019 -MRI of the brain did not show evidence of metastases on 10/17/2019 -Status post EBUS on 11/13/2019 -pathology consistent with small cell lung cancer -CT of the abdomen pelvis on 10/20/2019 with a nonspecific left adrenal nodule and innumerable lesions in the  liver suspicious for metastatic disease -I have again discussed the diagnosis with the patient who seems to have limited understanding of this conversation.  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  Treatment for this is he would not be curative but will be given with palliative intent. -His performance status is marginal, ECOG performance status of 3 at best -Additionally, he continues to be encephalopathic which could represent a paraneoplastic process. -He has been started on palliative radiation. -In prior discussions with his brother, [name], he understands that his cancer is not curable.    -The patient's brother has expressed that he understands that this is not a curable disease but would like to attempt aggressive treatment including chemotherapy. -After lengthy discussion with the patient and his brother (healthcare POA) yesterday, the decision was made to proceed with palliative chemotherapy -The dose of chemotherapy has been reduced to avoid excessive toxicity ? Carboplatin AUC 4 on Day 1, etoposide 60mg /m2 on Days 1-2 ? Granix 355mcg, starting on Day 4, x 7 days  -If he tolerates the first cycle of chemotherapy relatively well, then we can consider increasing the dose of chemotherapy with subsequent treatment -However, if he tolerates the treatment poorly, then I would advise reassessing the goals of care and considering palliative care/hospice -Continue palliative radiation to the left lung mass as scheduled -Recommend SNF for rehabilitation.  Generalized tonic-clonic seizures/encephalopathy -He has not had any recurrent seizures and mental status slowly improving -He has been started on lactulose for persistently elevated ammonia level -Neurology has previously evaluated and has now signed off  Hypokalemia, hypomagnesemia, and hypophosphatemia, improving -Replete per hospitalist  Elevated LFTs -LFTs remain elevated, ? Due to liver lesions versus  medications -Monitor for now  Leukocytosis -He remains afebrile with negative cultures -Likely due to prednisone  Hypertension and history of CVA -Continue aggressive management of his hypertension -Remains on aspirin 81 mg daily  Rheumatoid arthritis -The patient has been maintained on high-dose prednisone for many years -He has been on Plaquenil and Enbrel in the past which are now on hold -He is currently on prednisone 20 mg daily  History  of alcohol and cocaine use -The patient reports that he is not drinking all alcohol and urine drug screen on admission was negative  Tobacco dependence -Smoking cessation was encouraged    LOS: 13 days   Mikey Bussing, DNP, AGPCNP-BC, AOCNP 10/30/19

## 2019-10-30 NOTE — Progress Notes (Signed)
Occupational Therapy Treatment Patient Details Name: Drew Nguyen MRN: [id number] DOB: August 12, 1956 Today's Date: 10/30/2019    History of present illness Pt is  an 64 y.o. male 78 white male HTN, COPD stage II-III, cocaine/EtOH abuse, depression, rheumatoid arthritis on recently started on Plaquenil, left corona radiata CVA 03/2019 with repeat stroke 06/06/2019,?  Vocal cord malignancy.  He presented to [facility] ED 10/17/2019 secondary generalized tonic-clonic seizures witnessed by home health aide.  Pt found to have L hilar mass that is advanced small cell lung CA.  Per oncology plan for SNF to strength before treatments.   OT comments  Patient has demonstrated steady progress toward OT goals. Overall self-care skill level at Modified ([place]) to Max Assist with extra time. Patient's level of assist with functional transfers fluctuate between Max-Moderate assist using RW. Patient was educated on compensatory strategies for threading B LE clothing and was able to complete task with Moderate assistance. Patient required Moderate A for bed>recliner transfer using RW.    Follow Up Recommendations  SNF;Supervision/Assistance - 24 hour    Equipment Recommendations  Other (comment)    Recommendations for Other Services      Precautions / Restrictions Precautions Precautions: Fall Restrictions Weight Bearing Restrictions: No       Mobility Bed Mobility Overal bed mobility: Needs Assistance Bed Mobility: Supine to Sit     Supine to sit: Min assist;HOB elevated        Transfers Overall transfer level: Needs assistance Equipment used: Rolling walker (2 wheeled) Transfers: Stand Pivot Transfers;Sit to/from Stand Sit to Stand: Mod assist;From elevated surface Stand pivot transfers: Mod assist            Balance   Sitting-balance support: Bilateral upper extremity supported;Feet supported Sitting balance-Leahy Scale: Fair     Standing balance support: Bilateral upper  extremity supported;During functional activity Standing balance-Leahy Scale: Poor                             ADL either performed or assessed with clinical judgement   ADL       Grooming: Oral care;Set up;Wash/dry face               Lower Body Dressing: Moderate assistance               Functional mobility during ADLs: Moderate assistance       Vision       Perception     Praxis      Cognition Arousal/Alertness: Awake/alert Behavior During Therapy: WFL for tasks assessed/performed                                            Exercises     Shoulder Instructions       General Comments      Pertinent Vitals/ Pain       Pain Assessment: 0-10 Pain Score: 4  Pain Location: chest (lungs) Pain Descriptors / Indicators: Discomfort Pain Intervention(s): Limited activity within patient's tolerance  Home Living                                          Prior Functioning/Environment              Frequency  Progress Toward Goals  OT Goals(current goals can now be found in the care plan section)  Progress towards OT goals: Progressing toward goals     Plan Discharge plan remains appropriate    Co-evaluation                 AM-PAC OT "6 Clicks" Daily Activity     Outcome Measure   Help from another person eating meals?: None Help from another person taking care of personal grooming?: A Little Help from another person toileting, which includes using toliet, bedpan, or urinal?: A Lot Help from another person bathing (including washing, rinsing, drying)?: A Lot Help from another person to put on and taking off regular upper body clothing?: A Little Help from another person to put on and taking off regular lower body clothing?: A Lot 6 Click Score: 16    End of Session Equipment Utilized During Treatment: Rolling walker      Activity Tolerance Patient tolerated treatment  well   Patient Left in chair;with call bell/phone within reach;with chair alarm set;with nursing/sitter in room   Nurse Communication Mobility status        Time: 0017-4944 OT Time Calculation (min): 33 min  Charges: OT General Charges $OT Visit: 1 Visit OT Treatments $Self Care/Home Management : 23-37 mins  Shaquandra Galano OTR/L    Gerda Yin 10/30/2019, 1:13 PM

## 2019-10-30 NOTE — Progress Notes (Signed)
Patient is much weaker this morning than he was last night. He is having longer periods of SOB - currently on [place] 2L to help compensate and o2 stats are at 94%. Room air patient was at 90%. Patient unable to stand without two assist (last night patient stood up with one assist) - Day shift RN informed of the changes.

## 2019-10-31 ENCOUNTER — Encounter: Payer: Self-pay | Admitting: Radiation Oncology

## 2019-10-31 ENCOUNTER — Other Ambulatory Visit: Payer: Self-pay | Admitting: Hematology

## 2019-10-31 ENCOUNTER — Ambulatory Visit
Admit: 2019-10-31 | Discharge: 2019-10-31 | Disposition: A | Discharge status: Home / Self Care | Payer: Medicaid Other | Attending: Radiation Oncology | Admitting: Radiation Oncology

## 2019-10-31 LAB — CBC
HCT: 28.9 % — ABNORMAL LOW (ref 39.0–52.0)
Hemoglobin: 9.7 g/dL — ABNORMAL LOW (ref 13.0–17.0)
MCH: 32.4 pg (ref 26.0–34.0)
MCHC: 33.6 g/dL (ref 30.0–36.0)
MCV: 96.7 fL (ref 80.0–100.0)
Platelets: 126 10*3/uL — ABNORMAL LOW (ref 150–400)
RBC: 2.99 MIL/uL — ABNORMAL LOW (ref 4.22–5.81)
RDW: 13.1 % (ref 11.5–15.5)
WBC: 9.1 10*3/uL (ref 4.0–10.5)
nRBC: 0.2 % (ref 0.0–0.2)

## 2019-10-31 LAB — COMPREHENSIVE METABOLIC PANEL
ALT: 266 U/L — ABNORMAL HIGH (ref 0–44)
AST: 101 U/L — ABNORMAL HIGH (ref 15–41)
Albumin: 2.3 g/dL — ABNORMAL LOW (ref 3.5–5.0)
Alkaline Phosphatase: 337 U/L — ABNORMAL HIGH (ref 38–126)
Anion gap: 8 (ref 5–15)
BUN: 21 mg/dL (ref 8–23)
CO2: 27 mmol/L (ref 22–32)
Calcium: 8 mg/dL — ABNORMAL LOW (ref 8.9–10.3)
Chloride: 102 mmol/L (ref 98–111)
Creatinine, Ser: 0.6 mg/dL — ABNORMAL LOW (ref 0.61–1.24)
GFR calc Af Amer: >60 mL/min (ref >60)
GFR calc non Af Amer: >60 mL/min (ref >60)
Glucose, Bld: 203 mg/dL — ABNORMAL HIGH (ref 70–99)
Potassium: 3.7 mmol/L (ref 3.5–5.1)
Sodium: 137 mmol/L (ref 135–145)
Total Bilirubin: 0.8 mg/dL (ref 0.3–1.2)
Total Protein: 5.1 g/dL — ABNORMAL LOW (ref 6.5–8.1)

## 2019-10-31 LAB — MAGNESIUM: Magnesium: 1.8 mg/dL (ref 1.7–2.4)

## 2019-10-31 LAB — LACTATE DEHYDROGENASE: LDH: 1007 U/L — ABNORMAL HIGH (ref 98–192)

## 2019-10-31 LAB — PHOSPHORUS: Phosphorus: 2.8 mg/dL (ref 2.5–4.6)

## 2019-10-31 MED ORDER — SODIUM CHLORIDE 0.9 % IV SOLN
10.0000 mg | Freq: Once | INTRAVENOUS | Status: AC
  Administered 2019-10-31: 10 mg via INTRAVENOUS
  Filled 2019-10-31: qty 1

## 2019-10-31 MED ORDER — FLUCONAZOLE 100MG IVPB
100.0000 mg | Freq: Every day | INTRAVENOUS | Status: DC
  Administered 2019-10-31 – 2019-11-02 (×3): 100 mg via INTRAVENOUS
  Filled 2019-10-31 (×5): qty 50

## 2019-10-31 MED ORDER — GUAIFENESIN ER 600 MG PO TB12
600.0000 mg | ORAL_TABLET | Freq: Two times a day (BID) | ORAL | Status: DC
  Administered 2019-10-31 (×2): 600 mg via ORAL
  Filled 2019-10-31 (×3): qty 1

## 2019-10-31 MED ORDER — SODIUM CHLORIDE 0.9 % IV SOLN
60.0000 mg/m2 | Freq: Once | INTRAVENOUS | Status: AC
  Administered 2019-10-31: 110 mg via INTRAVENOUS
  Filled 2019-10-31: qty 5.5

## 2019-10-31 MED ORDER — MAGNESIUM SULFATE 2 GM/50ML IV SOLN
2.0000 g | Freq: Once | INTRAVENOUS | Status: AC
  Administered 2019-10-31: 2 g via INTRAVENOUS
  Filled 2019-10-31: qty 50

## 2019-10-31 NOTE — TOC Progression Note (Signed)
Transition of Care [facility]) - Progression Note    Patient Details  Name: Drew Nguyen MRN: [id number] Date of Birth: [birth year]  Transition of Care [facility]) CM/SW Contact  Na Waldrip, Marjie Skiff, RN Phone Number: 10/31/2019, 3:37 PM  Clinical Narrative:     Pt given his one bed offer of [facility] and pt accepted that bed. It was confirmed with Lorrie at [facility] that pt can be transferred there over the weekend and they would be able to transport him back to the cancer center for chemo and radiation. TOC will continue to follow.   Expected Discharge Plan: (Home with [facility] v SNF) Barriers to Discharge: Other (comment)(Finding either facility or [facility] provider willing to work with MCD and oncological needs)  Expected Discharge Plan and Services Expected Discharge Plan: (Home with [facility] v SNF)   Discharge Planning Services: CM Consult Post Acute Care Choice: Durable Medical Equipment, Home Health Living arrangements for the past 2 months: Single Family Home                        Social Determinants of Health (SDOH) Interventions    Readmission Risk Interventions Readmission Risk Prevention Plan 10/27/2019  Transportation Screening Complete  Medication Review [employer]) Complete  SW Recovery Care/Counseling Consult Complete  Palliative Care Screening Complete  Skilled Nursing Facility Complete  Some recent data might be hidden

## 2019-10-31 NOTE — Progress Notes (Signed)
Physical Therapy Treatment Patient Details Name: Drew Nguyen MRN: [id number] DOB: 1956/07/20 Today's Date: 10/31/2019    History of Present Illness Pt is  an 64 y.o. 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  Vocal cord malignancy.  He presented to [facility] ED 10/17/2019 secondary generalized tonic-clonic seizures witnessed by home health aide.  Pt found to have L hilar mass that is advanced small cell lung CA.  Per oncology plan for SNF to strength before treatments.    PT Comments    Pt eager to get moving today!  Pt assisted with standing and requiring mod assist however only min assist to ambulate.  Pt ambulated to/from bathroom.  Pt also stood at sink to wash his hands and required min steadying assist.  Pt left up in recliner and SPO2 93% on room air.  Pt pleased with progress today.  Continue to recommend SNF upon d/c.   Follow Up Recommendations  SNF     Equipment Recommendations  Rolling walker with 5" wheels    Recommendations for Other Services       Precautions / Restrictions Precautions Precautions: Fall    Mobility  Bed Mobility Overal bed mobility: Needs Assistance Bed Mobility: Supine to Sit     Supine to sit: Min assist     General bed mobility comments: slight assist for trunk upright  Transfers Overall transfer level: Needs assistance Equipment used: Rolling walker (2 wheeled) Transfers: Sit to/from Stand Sit to Stand: Mod assist         General transfer comment: verbal cues for technique, assist to rise and steady  Ambulation/Gait Ambulation/Gait assistance: Min assist;Min guard Gait Distance (Feet): 16 Feet Assistive device: Rolling walker (2 wheeled) Gait Pattern/deviations: Decreased stride length;Wide base of support;Step-through pattern     General Gait Details: slow gait, wide BOS for  steadying observed at times, occasional assist to steady, ambulated to/from bathrooom per pt request; SPO2 93% on room air after ambulating   Stairs             Wheelchair Mobility    Modified Rankin (Stroke Patients Only)       Balance           Standing balance support: Bilateral upper extremity supported;During functional activity Standing balance-Leahy Scale: Poor                              Cognition Arousal/Alertness: Awake/alert Behavior During Therapy: WFL for tasks assessed/performed Overall Cognitive Status: Within Functional Limits for tasks assessed                                        Exercises      General Comments        Pertinent Vitals/Pain Pain Assessment: No/denies pain Pain Intervention(s): Repositioned;Monitored during session    Home Living                      Prior Function            PT Goals (current goals can now be found in the care plan section) Progress towards PT goals: Progressing toward goals    Frequency    Min 2X/week      PT Plan Current plan  remains appropriate    Co-evaluation              AM-PAC PT "6 Clicks" Mobility   Outcome Measure  Help needed turning from your back to your side while in a flat bed without using bedrails?: A Little Help needed moving from lying on your back to sitting on the side of a flat bed without using bedrails?: A Little Help needed moving to and from a bed to a chair (including a wheelchair)?: A Lot Help needed standing up from a chair using your arms (e.g., wheelchair or bedside chair)?: A Lot Help needed to walk in hospital room?: A Little Help needed climbing 3-5 steps with a railing? : A Lot 6 Click Score: 15    End of Session Equipment Utilized During Treatment: Gait belt Activity Tolerance: Patient tolerated treatment well Patient left: with chair alarm set;in chair;with call bell/phone within reach;with family/visitor  present   PT Visit Diagnosis: Unsteadiness on feet (R26.81);Muscle weakness (generalized) (M62.81)     Time: 6886-4847 PT Time Calculation (min) (ACUTE ONLY): 27 min  Charges:  $Gait Training: 23-37 mins                     Jannette Spanner PT, DPT Acute Rehabilitation Services Office: [phone]  York Ram E 10/31/2019, 1:11 PM

## 2019-10-31 NOTE — Plan of Care (Signed)
  Problem: Education: Goal: Expressions of having a comfortable level of knowledge regarding the disease process will increase Outcome: Progressing   Problem: Coping: Goal: Ability to adjust to condition or change in health will improve Outcome: Progressing Goal: Ability to identify appropriate support needs will improve Outcome: Progressing   Problem: Medication: Goal: Risk for medication side effects will decrease Outcome: Progressing   Problem: Clinical Measurements: Goal: Complications related to the disease process, condition or treatment will be avoided or minimized Outcome: Progressing Goal: Diagnostic test results will improve Outcome: Progressing   Problem: Safety: Goal: Verbalization of understanding the information provided will improve Outcome: Progressing   Problem: Education: Goal: Knowledge of General Education information will improve Description: Including pain rating scale, medication(s)/side effects and non-pharmacologic comfort measures Outcome: Progressing   Problem: Health Behavior/Discharge Planning: Goal: Ability to manage health-related needs will improve Outcome: Progressing   Problem: Clinical Measurements: Goal: Ability to maintain clinical measurements within normal limits will improve Outcome: Progressing Goal: Will remain free from infection Outcome: Progressing Goal: Diagnostic test results will improve Outcome: Progressing Goal: Respiratory complications will improve Outcome: Progressing Goal: Cardiovascular complication will be avoided Outcome: Progressing   Problem: Activity: Goal: Risk for activity intolerance will decrease Outcome: Progressing   Problem: Nutrition: Goal: Adequate nutrition will be maintained Outcome: Progressing   Problem: Coping: Goal: Level of anxiety will decrease Outcome: Progressing   Problem: Elimination: Goal: Will not experience complications related to bowel motility Outcome: Progressing Goal:  Will not experience complications related to urinary retention Outcome: Progressing   Problem: Safety: Goal: Ability to remain free from injury will improve Outcome: Progressing   Problem: Skin Integrity: Goal: Risk for impaired skin integrity will decrease Outcome: Progressing   Problem: Health Behavior/Discharge Planning: Goal: Compliance with prescribed medication regimen will improve Outcome: Not Progressing   Problem: Pain Managment: Goal: General experience of comfort will improve Outcome: Not Progressing

## 2019-10-31 NOTE — Progress Notes (Signed)
PROGRESS NOTE    Drew Nguyen  ZOX:[id number] DOB: [birth year] DOA: 10/11/2019 PCP: Boyce Medici, FNP   Brief Narrative: Full-year-old with past medical history significant for CVA x2 in August, then in October 2020 on DAPT, alcohol abuse, COPD, rheumatoid arthritis on chronic prednisone and Plaquenil who initially presented to the ED on 2/25 due to seizure episode that occurred at home witnessed by home health aide, suspected to be due to alcohol withdrawal.  MRI showed remote lacunar infarct without acute abnormality.  Later EEG was reportedly normal.  Chest x-ray was abnormal and subsequent CT chest demonstrated a hilar mass with associated adenopathy and compression of left upper lobe bronchus and pulmonary artery with distal atelectasis.  This also showed distal esophageal thickening and nonspecific left adrenal thickening.  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  CT abdomen and pelvis performed to follow-up left adrenal finding revealed a nonspecific nodule with innumerable hypovascular area in the liver confirmed on subsequent MRI, which also showed diffuse enhancing of T2 hyperintense bone lesions throughout the visualized portion of the thoracolumbar spine and pelvis, consistent with diffuse bone metastasis.   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 Appreciate oncology consultation, plan is to initiate chemotherapy on 3/10  until 3/12 as an inpatient Radiation oncology guiding radiation therapy initiated as an inpatient Palliative care team also consulted and following Plan to monitor  Mg, Phosphorus, LDH level elevated.  Plan to give etoposide today. And continue chemo on Monday out patient if patient is not in the hospital. .   2-Hepatic metastasis with hepatic encephalopathy: Liver function tests continue to be elevated Hepatitis panel negative Continue with lactulose Delirium has improved  LUL postobstructive CAP: Completed doxycycline from 12/26 until 3/2  Bleeding at Lovenox injection site: resolved Apply pressure.  Lovenox discontinued Hemoglobin has remained stable  Hyponatremia possible due to Potomania.  resolved  Generalized tonic-clonic seizure acute toxic metabolic encephalopathy: EEG negative Neurology has signed off  Hypokalemia: Continue with potassium supplementation, BID>   History of recurrent CVA Plavix held around time of biopsy.  Plan to resume Plavix and aspirin  today  Hypertension: With hypertensive urgency: Continue with  losartan and Coreg, BiDil  RA;  Tapering from a stress dose of steroid not on prednisone Usually takes 15 mg daily Holding Plaquenil and Enbrel  Polysubstance abuse, alcohol abuse tobacco abuse: Counseling provided  COPD no exacerbation Esophageal thickening:  Slurred speech: Multifactorial most likely due to encephalopathy/possibly paraneoplastic, dry mouth causing dysarthria. Subtle right-sided lower facial weakness is only focal deficit on exam. CT head is without acute stroke.  - Continue augmented lactulose Tx.  - Supportive care otherwise. Seems improved today. If continues and/or waxes/wanes, consider changing risperdal and other psychoactive medications.  Esophageal thickening: Appreciate GI consultation, patient was a started on PPI twice daily and nystatin for oral thrush  Depression: Continue with Celexa and risperidone  FTT,:  Require  skilled nursing facility for admission  Dysphagia; having trouble swallowing pills today. Will add IV diflucan.   Estimated body mass index is 24.37 kg/m as calculated from the following:   Height as of this encounter: 5\' 6"  (1.676 m).   Weight as of this encounter: 68.5 kg.   DVT prophylaxis: SCDs Code Status: Full code Family Communication: Care discussed with patient directly Disposition Plan:  Patient is from: Home Anticipated d/c date: In 2 to 3 days if he is able to tolerate chemotherapy Barriers to d/c or necessity for inpatient status: getting chemo, difficulty swallowing today.   Consultants:   Oncology  Neurology  PCCM  Radiation oncology  Palliative care  GI  Procedures:  -EEG -Flexible video fiberoptic bronchoscopy with endobronchial ultrasound and biopsies on March 2 by Dr. Shearon Stalls  Antimicrobials:  Doxycycline from 2/26--3/2  Subjective: Alert, report cough, per nurse patient having trouble swallowing pills today.   Objective: Vitals:   10/31/19 0546 10/31/19 0546 10/31/19 0759 10/31/19 1302  BP: (!) 147/79 (!) 147/79  (!) 153/86  Pulse: 66 67  65  Resp: 16 16  17   Temp: 98.5 F (36.9 C) 98.5 F (36.9 C)  98.8 F (37.1 C)  TempSrc: Oral Oral  Oral  SpO2: 93% 93% 92% 92%  Weight:      Height:        Intake/Output Summary (Last 24 hours) at 10/31/2019 1652 Last data filed at 10/31/2019 1400 Gross per 24 hour  Intake 600 ml  Output 1075 ml  Net -475 ml   Filed Weights   10/06/2019 2057 11/10/2019 1126 10/30/19 0700  Weight: 70 kg 70 kg 68.5 kg    Examination:  General exam: NAD Respiratory system: CTA Cardiovascular system: S 1, S 2 RRR Gastrointestinal system: BS present, soft, nt Central nervous system: Alert,  Extremities:no edema Skin: No rashes  Data Reviewed: I have personally reviewed following labs and imaging studies  CBC: Recent Labs  Lab 10/25/19 0545 10/26/19 0548 10/27/19 0557 10/28/19 0608 10/29/19 0530  10/30/19 0443 10/31/19 0500  WBC 14.2*   < > 14.0* 13.6* 13.0* 11.1* 9.1  NEUTROABS 12.0*  --   --   --   --   --   --   HGB 12.5*   < > 10.8* 11.1* 11.0* 11.8* 9.7*  HCT 37.6*   < > 32.9* 33.2* 33.2* 35.2* 28.9*  MCV 95.7   < > 96.2 95.4 95.7 94.6 96.7  PLT 166   < > 153 149* 153 142* 126*   < > = values in this interval not displayed.   Basic Metabolic Panel: Recent Labs  Lab 10/25/19 0545 10/25/19 0545 10/26/19 0548 10/26/19 0548 10/27/19 0557 10/28/19 0608 10/29/19 0530 10/30/19 0443 10/31/19 0500  NA 140   < > 141   < > 141 140 139 140 137  K 2.4*   < > 2.9*   < > 3.5 3.2* 3.0* 2.7* 3.7  CL 103   < > 103   < > 105 103 103 103 102  CO2 27   < > 26   < > 27 26 25 28 27   GLUCOSE 153*   < > 228*   < > 162* 160* 164* 186* 203*  BUN 17   < > 24*   < > 25* 26* 26* 23 21  CREATININE 0.52*   < > 0.61   < > 0.55* 0.66 0.71 0.56* 0.60*  CALCIUM 8.2*   < > 8.6*   < >  8.2* 8.0* 7.9* 8.2* 8.0*  MG 1.8   < > 1.8  --  2.1 2.0 1.9  --  1.8  PHOS 2.7  --   --   --   --   --   --   --  2.8   < > = values in this interval not displayed.   GFR: Estimated Creatinine Clearance: 84.2 mL/min (A) (by C-G formula based on SCr of 0.6 mg/dL (L)). Liver Function Tests: Recent Labs  Lab 10/27/19 0557 10/28/19 0608 10/29/19 0530 10/30/19 0443 10/31/19 0500  AST 113* 155* 144* 141* 101*  ALT 319* 349* 311* 338* 266*  ALKPHOS 360* 413* 412* 456* 337*  BILITOT 1.0 1.2 1.3* 1.4* 0.8  PROT 5.7* 5.8* 5.4* 5.7* 5.1*  ALBUMIN 2.9* 3.1* 2.7* 2.8* 2.3*   No results for input(s): LIPASE, AMYLASE in the last 168 hours. No results for input(s): AMMONIA in the last 168 hours. Coagulation Profile: Recent Labs  Lab 10/26/19 0548  INR 1.0   Cardiac Enzymes: No results for input(s): CKTOTAL, CKMB, CKMBINDEX, TROPONINI in the last 168 hours. BNP (last 3 results) No results for input(s): PROBNP in the last 8760 hours. HbA1C: No results for input(s): HGBA1C in the last 72 hours. CBG: No results  for input(s): GLUCAP in the last 168 hours. Lipid Profile: No results for input(s): CHOL, HDL, LDLCALC, TRIG, CHOLHDL, LDLDIRECT in the last 72 hours. Thyroid Function Tests: No results for input(s): TSH, T4TOTAL, FREET4, T3FREE, THYROIDAB in the last 72 hours. Anemia Panel: No results for input(s): VITAMINB12, FOLATE, FERRITIN, TIBC, IRON, RETICCTPCT in the last 72 hours. Sepsis Labs: No results for input(s): PROCALCITON, LATICACIDVEN in the last 168 hours.  No results found for this or any previous visit (from the past 240 hour(s)).       Radiology Studies: No results found.      Scheduled Meds: . amLODipine  10 mg Oral Daily  . aspirin EC  81 mg Oral Daily  . carvedilol  12.5 mg Oral BID WC  . Chlorhexidine Gluconate Cloth  6 each Topical Daily  . clopidogrel  75 mg Oral Daily  . etoposide  60 mg/m2 (Treatment Plan Recorded) Intravenous Once  . feeding supplement (ENSURE ENLIVE)  237 mL Oral BID BM  . guaiFENesin  600 mg Oral BID  . isosorbide-hydrALAZINE  1 tablet Oral TID  . lactulose  30 g Oral BID  . lidocaine  15 mL Mouth/Throat TID AC  . losartan  50 mg Oral Daily  . mometasone-formoterol  2 puff Inhalation BID  . nystatin  5 mL Oral QID  . pantoprazole  40 mg Oral BID  . predniSONE  20 mg Oral QAC breakfast  . risperiDONE  1 mg Oral QHS  . senna-docusate  1 tablet Oral QHS  . sodium chloride flush  10-40 mL Intracatheter Q12H  . [START ON 11/01/2019] Tbo-Filgrastim  300 mcg Subcutaneous Daily  . traZODone  50 mg Oral QHS  . umeclidinium bromide  1 puff Inhalation Daily   Continuous Infusions: . sodium chloride    . sodium chloride    . famotidine (PEPCID) IV (ONCOLOGY)    . fluconazole (DIFLUCAN) IV 100 mg (10/31/19 1626)     LOS: 14 days    Time spent: 35 minutes.     Elmarie Shiley, MD [facility]   If 7PM-7AM, please contact night-coverage [email or web address]  10/31/2019, 4:52 PM

## 2019-10-31 NOTE — Progress Notes (Signed)
HEMATOLOGY-ONCOLOGY PROGRESS NOTE  SUBJECTIVE: The patient has some mild shortness of breath this morning.  He denies any abdominal pain, nausea, vomiting.  Continues to tolerate his chemotherapy well overall.  Due for day 3 of cycle 1 of carboplatin and etoposide today.  He has no other complaints.  REVIEW OF SYSTEMS:   Constitutional: Denies fevers, chills or abnormal weight loss Eyes: Denies blurriness of vision Ears, nose, mouth, throat, and face: Denies mucositis or sore throat Respiratory: Reports some mild shortness of breath Cardiovascular: Denies palpitation, chest discomfort Gastrointestinal:  Denies nausea, heartburn or change in bowel habits Skin: Denies abnormal skin rashes Lymphatics: Denies new lymphadenopathy or easy bruising Neurological:Denies numbness, tingling or new weaknesses Behavioral/Psych: Mood is stable, no new changes  Extremities: No lower extremity edema All other systems were reviewed with the patient and are negative.  I have reviewed the past medical history, past surgical history, social history and family history with the patient and they are unchanged from previous note.   PHYSICAL EXAMINATION: ECOG PERFORMANCE STATUS: 3 - Symptomatic, >50% confined to bed  Vitals:   10/31/19 0546 10/31/19 0759  BP: (!) 147/79   Pulse: 67   Resp: 16   Temp: 98.5 F (36.9 C)   SpO2: 93% 92%   Filed Weights   09/29/2019 2057 10/23/2019 1126 10/30/19 0700  Weight: 70 kg 70 kg 68.5 kg    Intake/Output from previous day: 03/11 0701 - 03/12 0700 In: 840 [P.O.:840] Out: 675 [Urine:675]  General: Chronically ill-appearing male, no distress Eyes:  no scleral icterus.  Marland Kitchen   Respiratory: lungs were clear bilaterally without wheezing or crackles.   Cardiovascular:  Regular rate and rhythm, S1/S2, without murmur, rub or gallop. There was no pedal edema.   GI:  abdomen was soft, flat, nontender, nondistended, without organomegaly.  Skin: Multiple bruises noted on his  upper extremities, no petechiae MSK: Strength 4/5 in the bilateral lower extremities. Neuro exam was nonfocal.    Alert and oriented x3 this morning.  LABORATORY DATA:  I have reviewed the data as listed CMP Latest Ref Rng & Units 10/31/2019 10/30/2019 10/29/2019  Glucose 70 - 99 mg/dL 203(H) 186(H) 164(H)  BUN 8 - 23 mg/dL 21 23 26(H)  Creatinine 0.61 - 1.24 mg/dL 0.60(L) 0.56(L) 0.71  Sodium 135 - 145 mmol/L 137 140 139  Potassium 3.5 - 5.1 mmol/L 3.7 2.7(LL) 3.0(L)  Chloride 98 - 111 mmol/L 102 103 103  CO2 22 - 32 mmol/L 27 28 25   Calcium 8.9 - 10.3 mg/dL 8.0(L) 8.2(L) 7.9(L)  Total Protein 6.5 - 8.1 g/dL 5.1(L) 5.7(L) 5.4(L)  Total Bilirubin 0.3 - 1.2 mg/dL 0.8 1.4(H) 1.3(H)  Alkaline Phos 38 - 126 U/L 337(H) 456(H) 412(H)  AST 15 - 41 U/L 101(H) 141(H) 144(H)  ALT 0 - 44 U/L 266(H) 338(H) 311(H)    Lab Results  Component Value Date   WBC 9.1 10/31/2019   HGB 9.7 (L) 10/31/2019   HCT 28.9 (L) 10/31/2019   MCV 96.7 10/31/2019   PLT 126 (L) 10/31/2019   NEUTROABS 12.0 (H) 10/25/2019    EEG  Result Date: 10/17/2019 Lora Havens, MD     10/17/2019  1:38 PM Patient Name: MACSEN NUTTALL MRN: [id number] Epilepsy Attending: Lora Havens Referring Physician/Provider: Dr. Gean Birchwood Date: 10/17/2019 Duration: 25.26 minutes Patient history: 64 year old male with history of alcohol use, cocaine use who presented with generalized tonic-clonic seizure-like episode.  EEG to evaluate for seizures. Level of alertness: Awake AEDs during EEG study:  Ativan Technical aspects: This EEG study was done with scalp electrodes positioned according to the 10-20 International system of electrode placement. Electrical activity was acquired at a sampling rate of 500Hz  and reviewed with a high frequency filter of 70Hz  and a low frequency filter of 1Hz . EEG data were recorded continuously and digitally stored. Description: The posterior dominant rhythm consists of 9-10 Hz activity of moderate voltage  (25-35 uV) seen predominantly in posterior head regions, symmetric and reactive to eye opening and eye closing.  There is also 15 to 18 Hz beta activity  distributed symmetrically and diffusely.  Physiologic photic driving was seen during photic stimulation.  Hyperventilation was not performed. IMPRESSION: This study is within normal limits. No seizures or epileptiform discharges were seen throughout the recording. Lora Havens   DG Chest 2 View  Result Date: 10/25/2019 CLINICAL DATA:  Cough. Right-sided chest pain. Shortness of breath. EXAM: CHEST - 2 VIEW COMPARISON:  Radiograph CT 01/08/2020. Chest CT 10/17/2019 FINDINGS: Progressive volume loss in the left hemithorax with progressive opacification throughout, patient with known left hilar mass. This is likely increasing postobstructive atelectasis throughout the left lung. Difficult to exclude an element of pleural effusion. Right lung is slightly hyperinflated but clear. Cardiomediastinal contours are obscured by left lung opacity. No evidence of right pulmonary edema. No visualized pneumothorax. IMPRESSION: Progressive volume loss in the left hemithorax with increased opacification, likely increasing postobstructive atelectasis throughout the left lung given history of left hilar mass and upper lobe collapse on recent CT. Difficult to exclude an element of pleural fluid on the left. Electronically Signed   By: Keith Rake M.D.   On: 10/25/2019 19:26   CT HEAD WO CONTRAST  Result Date: 10/25/2019 CLINICAL DATA:  Slurred speech, history of CVAs, neuro deficit, acute, stroke suspected. EXAM: CT HEAD WITHOUT CONTRAST TECHNIQUE: Contiguous axial images were obtained from the base of the skull through the vertex without intravenous contrast. COMPARISON:  Brain MRI 10/17/2019, head CT 09/23/2019. FINDINGS: Brain: There is no evidence of acute intracranial hemorrhage, intracranial mass, midline shift or extra-axial fluid collection.No demarcated  cortical infarction. Redemonstrated chronic lacunar infarcts within the bilateral corona radiata/basal ganglia. Background ill-defined hypoattenuation within the cerebral white matter is nonspecific, but consistent with chronic small vessel ischemic disease. Stable, mild generalized parenchymal atrophy. Vascular: No hyperdense vessel.  Atherosclerotic calcifications. Skull: Normal. Negative for fracture or focal lesion. Sinuses/Orbits: Visualized orbits demonstrate no acute abnormality. No significant paranasal sinus disease or mastoid effusion at the imaged levels. IMPRESSION: No evidence of acute intracranial abnormality. Redemonstrated chronic bilateral corona radiata/basal ganglia lacunar infarcts. Stable background generalized parenchymal atrophy and chronic small vessel ischemic disease. Electronically Signed   By: Kellie Simmering DO   On: 10/25/2019 18:33   CT Head Wo Contrast  Result Date: 10/04/2019 CLINICAL DATA:  Altered level of consciousness, seizure EXAM: CT HEAD WITHOUT CONTRAST TECHNIQUE: Contiguous axial images were obtained from the base of the skull through the vertex without intravenous contrast. COMPARISON:  06/05/2019, 06/06/2019 FINDINGS: Brain: There are chronic small vessel ischemic changes seen within the bilateral basal ganglia and periventricular white matter. No acute infarct or hemorrhage. Lateral ventricles and remaining midline structures are unremarkable. No acute extra-axial fluid collections. No mass effect. Vascular: No hyperdense vessel or unexpected calcification. Skull: Normal. Negative for fracture or focal lesion. Sinuses/Orbits: No acute finding. Other: None IMPRESSION: 1. Chronic small vessel ischemic changes, no acute intracranial process. Electronically Signed   By: Randa Ngo M.D.   On: 10/03/2019 22:54  CT CHEST W CONTRAST  Result Date: 10/17/2019 CLINICAL DATA:  Abnormal chest x-ray, pulmonary nodule, seizure, altered level of consciousness EXAM: CT CHEST  WITH CONTRAST TECHNIQUE: Multidetector CT imaging of the chest was performed during intravenous contrast administration. CONTRAST:  77mL OMNIPAQUE IOHEXOL 300 MG/ML  SOLN COMPARISON:  09/29/2019, 03/04/2019 FINDINGS: Cardiovascular: The heart is unremarkable without pericardial effusion. Thoracic aorta is normal in caliber without aneurysm or dissection. No filling defects or pulmonary emboli. Extrinsic compression upon the left pulmonary artery due to left hilar mass. Moderate atherosclerosis of the coronary vasculature, with mild calcified plaque of the aortic arch. Mediastinum/Nodes: There is a lobular soft tissue mass in the mediastinum and AP window, measuring 4.7 x 5.3 cm. Large left hilar mass with obstruction of the left upper lobe bronchus. No contralateral hilar adenopathy. There is circumferential wall thickening of the distal thoracic esophagus, nonspecific. Endoscopy may be useful. Lungs/Pleura: There is complete collapse of the left upper lobe due to a large left hilar mass causing abrupt cut off of the left upper lobe bronchus. Distinguishing the left hilar mass from the consolidated left upper lobe is difficult. PET scan recommended for further evaluation. Background emphysema. Minimal tree in bud ground-glass airspace disease within the left lower lobe likely inflammatory or infectious. There is narrowing of the left lower lobe bronchus with mucoid material filling the posterior basilar segmental bronchi. No effusion or pneumothorax. Upper Abdomen: Thickening of the left adrenal gland measuring 12 mm, suspicious for metastases. Remainder of the upper abdomen is unremarkable. Musculoskeletal: No acute or destructive bony lesions. Reconstructed images demonstrate no additional findings. IMPRESSION: 1. 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 This mass results in extrinsic compression of the left upper lobe pulmonary artery, as well as complete  obstruction of the left upper lobe bronchus and complete left upper lobe atelectasis. 2. Circumferential mural thickening of the distal thoracic esophagus. Endoscopy recommended for further evaluation. 3. Minimal left lower lobe tree-in-bud ground-glass airspace disease consistent with inflammation or infection. 4. Left adrenal thickening, nonspecific. Metastatic disease not excluded. 5.  Emphysema (ICD10-J43.9). Electronically Signed   By: Randa Ngo M.D.   On: 10/17/2019 02:01   MR BRAIN W WO CONTRAST  Result Date: 10/17/2019 CLINICAL DATA:  Seizure. Abnormal neuro exam. Hypertension. Patient stopped drinking daily alcohol 1 week ago. Confusion. EXAM: MRI HEAD WITHOUT AND WITH CONTRAST TECHNIQUE: Multiplanar, multiecho pulse sequences of the brain and surrounding structures were obtained without and with intravenous contrast. CONTRAST:  40mL GADAVIST GADOBUTROL 1 MMOL/ML IV SOLN COMPARISON:  CT head without contrast 10/07/2019. MRI of the head 04/19/2019 FINDINGS: Brain: The diffusion-weighted images demonstrate no acute or subacute infarct. Remote lacunar infarcts are present within the basal ganglia extending superiorly into the corona radiata bilaterally. Mild atrophy and white matter disease is present bilaterally, otherwise stable. The basal ganglia are otherwise unremarkable. Insular ribbon is within limits bilaterally. Study is mildly degraded by patient motion. Temporal lobes are normal. The internal auditory canals are normal bilaterally. The brainstem and cerebellum are normal. Postcontrast images demonstrate no pathologic enhancement. Vascular: Flow is present in the major intracranial arteries. Skull and upper cervical spine: The craniocervical junction is normal. Upper cervical spine is within normal limits. Marrow signal is unremarkable. Sinuses/Orbits: The paranasal sinuses and mastoid air cells are clear. The globes and orbits are within normal limits. IMPRESSION: 1. Remote nonhemorrhagic  lacunar infarcts of the basal ganglia bilaterally extending into the corona radiata. 2. Atrophy and white matter disease  are mildly advanced for age. 3. No acute or focal intracranial abnormality to explain seizures or confusion. Electronically Signed   By: San Morelle M.D.   On: 10/17/2019 05:30   CT ABDOMEN PELVIS W CONTRAST  Result Date: 10/22/2019 CLINICAL DATA:  64 year old male with history of adrenal mass suspicious for malignancy. Follow-up study. EXAM: CT ABDOMEN AND PELVIS WITH CONTRAST TECHNIQUE: Multidetector CT imaging of the abdomen and pelvis was performed using the standard protocol following bolus administration of intravenous contrast. CONTRAST:  177mL OMNIPAQUE IOHEXOL 300 MG/ML  SOLN COMPARISON:  No priors. FINDINGS: Lower chest: Atherosclerotic calcifications in the left circumflex and right coronary arteries. Hepatobiliary: Multiple ill-defined hypovascular lesions are scattered throughout the hepatic parenchyma, highly concerning for widespread metastatic disease. One specific example is the lesion at the junction of segment 8 and the caudate lobe (axial image 19 of series 3) measuring 3.2 x 2.1 cm. No intra or extrahepatic biliary ductal dilatation. Gallbladder is nearly completely collapsed with avid enhancement of the gallbladder mucosa and diffuse wall edema, without surrounding inflammatory changes. Pancreas: No pancreatic mass. No pancreatic ductal dilatation. No pancreatic or peripancreatic fluid collections or inflammatory changes. Spleen: Unremarkable. Adrenals/Urinary Tract: Subcentimeter low-attenuation lesions in both kidneys, too small to characterize, but statistically likely to represent cysts. No hydroureteronephrosis. Urinary bladder is normal in appearance. Right adrenal gland is normal. 1.2 cm left adrenal nodule (axial image 23 of series 3), similar to the prior chest CT 10/06/2019. Stomach/Bowel: Normal appearance of the stomach. No pathologic dilatation of  small bowel or colon. Numerous colonic diverticulae are noted, without surrounding inflammatory changes to suggest an acute diverticulitis at this time. Normal appendix. Vascular/Lymphatic: Aortic atherosclerosis, without evidence of aneurysm or dissection in the abdominal or pelvic vasculature. No lymphadenopathy noted in the abdomen or pelvis. Reproductive: Prostate gland and seminal vesicles are unremarkable in appearance. Other: No significant volume of ascites.  No pneumoperitoneum. Musculoskeletal: There are no aggressive appearing lytic or blastic lesions noted in the visualized portions of the skeleton. IMPRESSION: 1. Small left adrenal nodule is nonspecific. This could either be a benign or malignant lesion. 2. Importantly, there are innumerable hypovascular areas in the liver which are highly suspicious for metastatic disease. This could be better evaluated with follow-up abdominal MRI with and without IV gadolinium. 3. Colonic diverticulosis without evidence of acute diverticulitis at this time. 4. Additional incidental findings, as above. Electronically Signed   By: Vinnie Langton M.D.   On: 10/22/2019 16:40   MR LIVER W WO CONTRAST  Result Date: 10/24/2019 CLINICAL DATA:  Indeterminate liver lesions on recent CT. Recently diagnosed lung carcinoma. EXAM: MRI ABDOMEN WITHOUT AND WITH CONTRAST TECHNIQUE: Multiplanar multisequence MR imaging of the abdomen was performed both before and after the administration of intravenous contrast. CONTRAST:  32mL GADAVIST GADOBUTROL 1 MMOL/ML IV SOLN COMPARISON:  CT on 10/22/2019 FINDINGS: Lower chest: No acute findings. Hepatobiliary: Innumerable small hypovascular masses are seen throughout the liver, consistent with diffuse liver metastases. Gallbladder is nearly completely collapsed. No evidence of biliary ductal dilatation. Pancreas:  No mass or inflammatory changes. Spleen:  Within normal limits in size and appearance. Adrenals/Urinary Tract: No masses  identified. Small bilateral renal cysts noted. No evidence of hydronephrosis. Stomach/Bowel: Visualized portion unremarkable. Vascular/Lymphatic: Mild lymphadenopathy in porta hepatis measuring 1.9 cm short axis. No abdominal aortic aneurysm. Other:  None. Musculoskeletal: Diffuse enhancing T2 hyperintense bone lesions throughout the visualized portion of the thoracolumbar spine and pelvis, consistent with diffuse bone metastases. IMPRESSION: Diffuse liver metastases. Diffuse bone  metastases. Mild abdominal lymphadenopathy in porta hepatis, consistent with metastatic disease. Electronically Signed   By: Marlaine Hind M.D.   On: 10/24/2019 19:23   DG Chest Portable 1 View  Result Date: 10/02/2019 CLINICAL DATA:  Seizure, altered mental status EXAM: PORTABLE CHEST 1 VIEW COMPARISON:  06/06/2019 FINDINGS: Right lung is grossly clear. Asymmetric hazy opacification in the left thorax with increased left hilar density. Heart size upper limits of normal. No pneumothorax. IMPRESSION: Asymmetric hazy opacity in left thorax with asymmetric density in the left hilus, could be due to diffuse lung parenchymal air disease versus lung mass. Chest CT is recommended for further evaluation. Electronically Signed   By: Donavan Foil M.D.   On: 10/02/2019 22:42   [place] EKG SITE RITE  Result Date: 10/28/2019 If Site Rite image not attached, placement could not be confirmed due to current cardiac rhythm.   ASSESSMENT AND PLAN: Metastatic small cell lung cancer -Left hilar mass with mediastinal lymph hilar adenopathy, extrinsic compression of the left upper lobe pulmonary artery and complete obstruction of the left upper lobe bronchus noted on CT scan of the chest on 10/17/2019 -MRI of the brain did not show evidence of metastases on 10/17/2019 -Status post EBUS on 11/11/2019 -pathology consistent with small cell lung cancer -CT of the abdomen pelvis on 10/20/2019 with a nonspecific left adrenal nodule and innumerable lesions in the  liver suspicious for metastatic disease -I have again discussed the diagnosis with the patient who seems to have limited understanding of this conversation.  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  Treatment for this is he would not be curative but will be given with palliative intent. -His performance status is marginal, ECOG performance status of 3 at best -Additionally, he continues to be encephalopathic which could represent a paraneoplastic process. -He has been started on palliative radiation. -In prior discussions with his brother, [name], he understands that his cancer is not curable.    -The patient's brother has expressed that he understands that this is not a curable disease but would like to attempt aggressive treatment including chemotherapy. -After lengthy discussion with the patient and his brother (healthcare POA) yesterday, the decision was made to proceed with palliative chemotherapy -The dose of chemotherapy has been reduced to avoid excessive toxicity ? Carboplatin AUC 4 on Day 1, etoposide 60mg /m2 on Days 1-2 ? Granix 353mcg, starting on Day 4, x 7 days  -If he tolerates the first cycle of chemotherapy relatively well, then we can consider increasing the dose of chemotherapy with subsequent treatment -However, if he tolerates the treatment poorly, then I would advise reassessing the goals of care and considering palliative care/hospice -Continue palliative radiation to the left lung mass as scheduled -The patient will begin Granix 300 mcg subcu daily starting 11/01/2019.  If the patient gets a bed at SNF, then we can give Neulasta as an outpatient.  We have tentatively scheduled an injection appointment for Monday, 10/27/2019 but will cancel this if he remains inpatient. -Recommend SNF for rehabilitation.  Generalized tonic-clonic seizures/encephalopathy -He has not had any recurrent seizures and mental status slowly improving -He has been started on  lactulose for persistently elevated ammonia level -Neurology has previously evaluated and has now signed off  Hypokalemia, hypomagnesemia, and hypophosphatemia, improving -Replete per hospitalist  Elevated LFTs -LFTs remain elevated, ? Due to liver lesions versus medications -LFTs are trending downward -Monitor for now  Leukocytosis -He remains afebrile with negative cultures -Likely due to prednisone  Hypertension and  history of CVA -Continue aggressive management of his hypertension -Remains on aspirin 81 mg daily  Rheumatoid arthritis -The patient has been maintained on high-dose prednisone for many years -He has been on Plaquenil and Enbrel in the past which are now on hold -He is currently on prednisone 20 mg daily  History of alcohol and cocaine use -The patient reports that he is not drinking all alcohol and urine drug screen on admission was negative  Tobacco dependence -Smoking cessation was encouraged    LOS: 14 days   Mikey Bussing, DNP, AGPCNP-BC, AOCNP 10/31/19

## 2019-11-01 ENCOUNTER — Inpatient Hospital Stay (HOSPITAL_COMMUNITY): Payer: Medicaid Other

## 2019-11-01 LAB — CBC
HCT: 26.5 % — ABNORMAL LOW (ref 39.0–52.0)
HCT: 30.9 % — ABNORMAL LOW (ref 39.0–52.0)
Hemoglobin: 10.3 g/dL — ABNORMAL LOW (ref 13.0–17.0)
Hemoglobin: 8.7 g/dL — ABNORMAL LOW (ref 13.0–17.0)
MCH: 31.5 pg (ref 26.0–34.0)
MCH: 31.5 pg (ref 26.0–34.0)
MCHC: 32.8 g/dL (ref 30.0–36.0)
MCHC: 33.3 g/dL (ref 30.0–36.0)
MCV: 94.5 fL (ref 80.0–100.0)
MCV: 96 fL (ref 80.0–100.0)
Platelets: 147 10*3/uL — ABNORMAL LOW (ref 150–400)
Platelets: 172 10*3/uL (ref 150–400)
RBC: 2.76 MIL/uL — ABNORMAL LOW (ref 4.22–5.81)
RBC: 3.27 MIL/uL — ABNORMAL LOW (ref 4.22–5.81)
RDW: 12.9 % (ref 11.5–15.5)
RDW: 13 % (ref 11.5–15.5)
WBC: 16.2 10*3/uL — ABNORMAL HIGH (ref 4.0–10.5)
WBC: 9.4 10*3/uL (ref 4.0–10.5)
nRBC: 0 % (ref 0.0–0.2)
nRBC: 0 % (ref 0.0–0.2)

## 2019-11-01 LAB — CBC WITH DIFFERENTIAL/PLATELET
Abs Immature Granulocytes: 1.97 10*3/uL — ABNORMAL HIGH (ref 0.00–0.07)
Basophils Absolute: 0 10*3/uL (ref 0.0–0.1)
Basophils Relative: 0 %
Eosinophils Absolute: 0 10*3/uL (ref 0.0–0.5)
Eosinophils Relative: 0 %
HCT: 28.3 % — ABNORMAL LOW (ref 39.0–52.0)
Hemoglobin: 9.5 g/dL — ABNORMAL LOW (ref 13.0–17.0)
Immature Granulocytes: 15 %
Lymphocytes Relative: 3 %
Lymphs Abs: 0.4 10*3/uL — ABNORMAL LOW (ref 0.7–4.0)
MCH: 32.4 pg (ref 26.0–34.0)
MCHC: 33.6 g/dL (ref 30.0–36.0)
MCV: 96.6 fL (ref 80.0–100.0)
Monocytes Absolute: 0 10*3/uL — ABNORMAL LOW (ref 0.1–1.0)
Monocytes Relative: 0 %
Neutro Abs: 10.4 10*3/uL — ABNORMAL HIGH (ref 1.7–7.7)
Neutrophils Relative %: 82 %
Platelets: 158 10*3/uL (ref 150–400)
RBC: 2.93 MIL/uL — ABNORMAL LOW (ref 4.22–5.81)
RDW: 13.1 % (ref 11.5–15.5)
WBC Morphology: INCREASED
WBC: 12.8 10*3/uL — ABNORMAL HIGH (ref 4.0–10.5)
nRBC: 0 % (ref 0.0–0.2)

## 2019-11-01 LAB — LACTATE DEHYDROGENASE: LDH: 1338 U/L — ABNORMAL HIGH (ref 98–192)

## 2019-11-01 LAB — BASIC METABOLIC PANEL
Anion gap: 10 (ref 5–15)
BUN: 18 mg/dL (ref 8–23)
CO2: 28 mmol/L (ref 22–32)
Calcium: 7.6 mg/dL — ABNORMAL LOW (ref 8.9–10.3)
Chloride: 100 mmol/L (ref 98–111)
Creatinine, Ser: 0.52 mg/dL — ABNORMAL LOW (ref 0.61–1.24)
GFR calc Af Amer: >60 mL/min (ref >60)
GFR calc non Af Amer: >60 mL/min (ref >60)
Glucose, Bld: 253 mg/dL — ABNORMAL HIGH (ref 70–99)
Potassium: 2.6 mmol/L — CL (ref 3.5–5.1)
Sodium: 138 mmol/L (ref 135–145)

## 2019-11-01 LAB — PHOSPHORUS: Phosphorus: 2.8 mg/dL (ref 2.5–4.6)

## 2019-11-01 LAB — SARS CORONAVIRUS 2 (TAT 6-24 HRS): SARS Coronavirus 2: NEGATIVE

## 2019-11-01 LAB — MAGNESIUM: Magnesium: 1.8 mg/dL (ref 1.7–2.4)

## 2019-11-01 LAB — MRSA PCR SCREENING: MRSA by PCR: NEGATIVE

## 2019-11-01 IMAGING — DX DG CHEST 1V PORT
1 series · 1 of 1 positions shown · non-contrast
Comparison: [DATE]

CLINICAL DATA: Hemoptysis.

EXAM:
PORTABLE CHEST 1 VIEW

[chest ap]
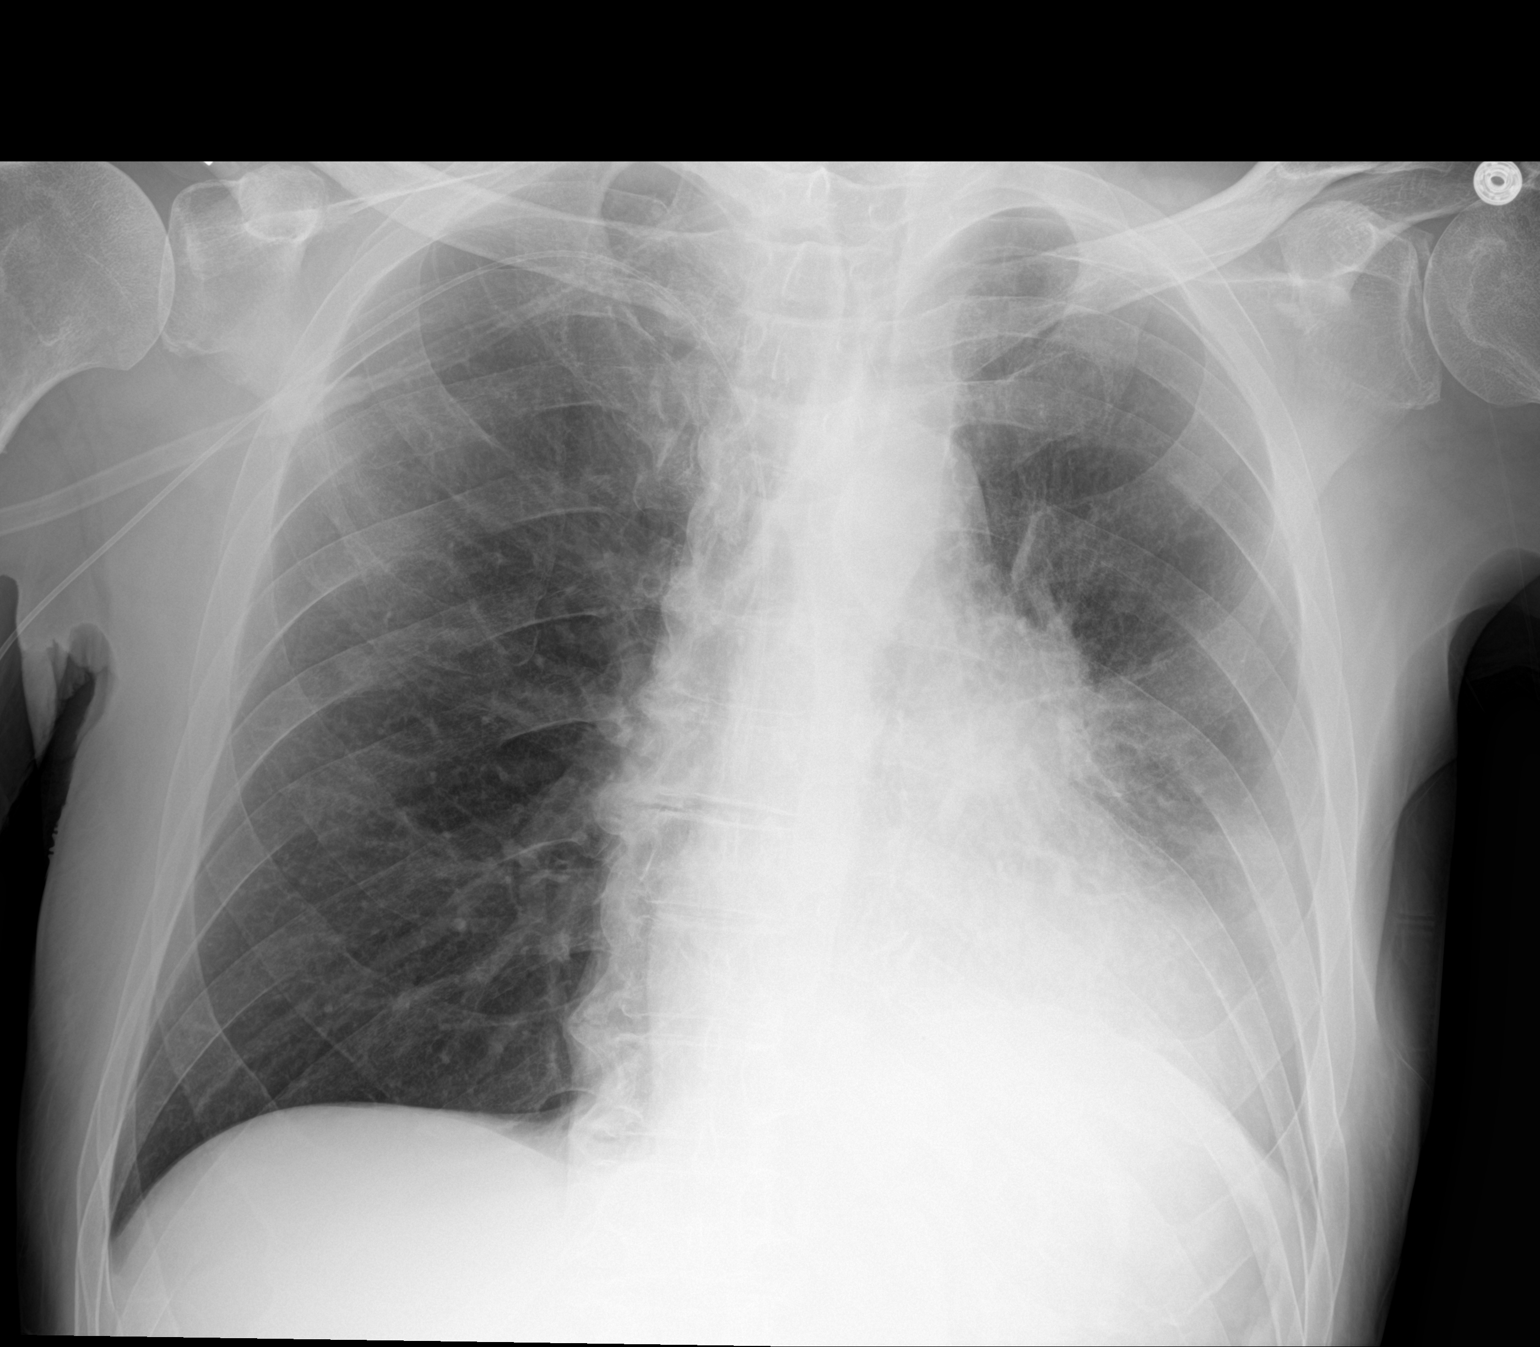

[1 of 1 positions shown; findings below may reference images not displayed]

FINDINGS: Mild-to-moderate severity atelectasis and/or infiltrate is seen
within the left lung base. This is decreased in severity when
compared to the prior study.

There is no evidence of a pleural effusion or pneumothorax.

The heart size and mediastinal contours are within normal limits.

Mild-to-moderate severity multilevel degenerative changes seen
throughout the thoracic spine.
IMPRESSION: Mild-to-moderate severity left basilar atelectasis and/or
infiltrate.

## 2019-11-01 MED ORDER — NOREPINEPHRINE 4 MG/250ML-% IV SOLN
INTRAVENOUS | Status: AC
  Filled 2019-11-01: qty 250

## 2019-11-01 MED ORDER — ROCURONIUM BROMIDE 50 MG/5ML IV SOLN
50.0000 mg | Freq: Once | INTRAVENOUS | Status: AC
  Administered 2019-11-01: 50 mg via INTRAVENOUS

## 2019-11-01 MED ORDER — KETAMINE HCL-SODIUM CHLORIDE 100-0.9 MG/10ML-% IV SOSY
1.0000 mg/kg | PREFILLED_SYRINGE | Freq: Once | INTRAVENOUS | Status: AC
  Administered 2019-11-01: 60 mg via INTRAVENOUS
  Filled 2019-11-01: qty 10

## 2019-11-01 MED ORDER — ETOMIDATE 2 MG/ML IV SOLN
20.0000 mg | Freq: Once | INTRAVENOUS | Status: AC
  Administered 2019-11-01: 20 mg via INTRAVENOUS

## 2019-11-01 MED ORDER — POTASSIUM CHLORIDE 10 MEQ/100ML IV SOLN
10.0000 meq | INTRAVENOUS | Status: AC
  Administered 2019-11-01 (×4): 10 meq via INTRAVENOUS
  Filled 2019-11-01 (×4): qty 100

## 2019-11-01 MED ORDER — HYDROCOD POLST-CPM POLST ER 10-8 MG/5ML PO SUER
5.0000 mL | Freq: Four times a day (QID) | ORAL | Status: DC | PRN
  Administered 2019-11-01: 5 mL via ORAL
  Filled 2019-11-01: qty 5

## 2019-11-01 MED ORDER — POTASSIUM CHLORIDE 20 MEQ PO PACK
40.0000 meq | PACK | Freq: Two times a day (BID) | ORAL | Status: AC
  Administered 2019-11-01: 40 meq via ORAL
  Filled 2019-11-01 (×2): qty 2

## 2019-11-01 MED ORDER — MAGNESIUM SULFATE 2 GM/50ML IV SOLN
2.0000 g | Freq: Once | INTRAVENOUS | Status: AC
  Administered 2019-11-01: 2 g via INTRAVENOUS
  Filled 2019-11-01: qty 50

## 2019-11-01 MED ORDER — ORAL CARE MOUTH RINSE: 15.0000 mL | Freq: Two times a day (BID) | OROMUCOSAL | Status: DC

## 2019-11-01 MED ORDER — MORPHINE SULFATE (CONCENTRATE) 10 MG/0.5ML PO SOLN
10.0000 mg | Freq: Once | ORAL | Status: AC
  Administered 2019-11-01: 10 mg via SUBLINGUAL

## 2019-11-01 NOTE — TOC Transition Note (Signed)
Transition of Care [facility]) - CM/SW Discharge Note   Patient Details  Name: Drew Nguyen MRN: [id number] Date of Birth: [birth year]  Transition of Care [facility]) CM/SW Contact:  Lia Hopping, [place] Phone Number: 11/01/2019, 10:08 AM   Clinical Narrative:   CSW confirm facility ready to accept today- Sarah admission coordinator.   Covid 19 test negative- results fax to [phone] Patient will d/c rm. 138 at [facility] Nurse call report to: [phone]  Patient and brother updated.  PTAR to transport.    Final next level of care: Skilled Nursing Facility Barriers to Discharge: No Barriers Identified   Patient Goals and CMS Choice Patient states their goals for this hospitalization and ongoing recovery are:: "I am planning on going home from here, and this is what I need." CMS Medicare.gov Compare Post Acute Care list provided to:: Patient Choice offered to / list presented to : Patient  Discharge Placement PASRR number recieved: 10/27/19            Patient chooses bed at: [facility] Patient to be transferred to facility by: [place] Name of family member notified: Brother-David Patient and family notified of of transfer: 11/01/19  Discharge Plan and Services   Discharge Planning Services: CM Consult Post Acute Care Choice: Durable Medical Equipment, Home Health                               Social Determinants of Health (SDOH) Interventions     Readmission Risk Interventions Readmission Risk Prevention Plan 10/27/2019  Transportation Screening Complete  Medication Review [employer] photographer) Complete  SW Recovery Care/Counseling Consult Complete  Palliative Care Screening Complete  Skilled Nursing Facility Complete  Some recent data might be hidden

## 2019-11-01 NOTE — Progress Notes (Signed)
Wasted 38ml ketamine push w/ Theadora Rama, RN

## 2019-11-01 NOTE — Progress Notes (Signed)
Informed e-Link nurse patient continues to spit up large amounts of blood. Bedside nurse and e-Link nurse agreed PO meds should be held to ensure patient does not aspirate.

## 2019-11-01 NOTE — Progress Notes (Addendum)
Pt presenting with acute coughing spell resulting in gross hemoptysis and several large, clots, bright red in color. Pt maintaining oxygen saturation on 4L between 92-95%. All other VS WNL. Pt c/o pain in rib cage from coughing and shortness of breath. MD, RT and Rapid Response RN paged. Stat CXR, CBC, pain and cough medication ordered as well as pt transfer to higher level of care. All belongings sent with pt at time of transfer. Per pt request, Emergency Contact, Sula Soda, was contacted and updated on pt transfer and change in status.

## 2019-11-01 NOTE — Progress Notes (Signed)
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  MRI showed remote lacunar infarct without acute abnormality.  Later EEG was reportedly normal.  Chest x-ray was abnormal and subsequent CT chest demonstrated a hilar mass with associated adenopathy and compression of left upper lobe bronchus and pulmonary artery with distal atelectasis.  This also showed distal esophageal thickening and nonspecific left adrenal thickening.  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  CT abdomen and pelvis performed to follow-up left adrenal finding revealed a nonspecific nodule with innumerable hypovascular area in the liver confirmed on subsequent MRI, which also showed diffuse enhancing of T2 hyperintense bone lesions throughout the visualized portion of the thoracolumbar spine and pelvis, consistent with diffuse bone metastasis.   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 Appreciate oncology consultation, plan is to initiate chemotherapy on 3/10  until 3/12 as an inpatient Radiation oncology guiding radiation therapy initiated as an inpatient Palliative care team also consulted and following Plan to monitor  Mg, Phosphorus, LDH level elevated.  Plan  to continue chemo on Monday.   2-Hepatic metastasis with hepatic encephalopathy: Liver function tests continue to be elevated Hepatitis panel negative Continue with lactulose Delirium has improved  LUL postobstructive CAP: Completed doxycycline from 12/26 until 3/2.  Bleeding at Lovenox injection site: resolved Apply pressure.  Lovenox discontinued Hemoglobin has remained stable  Hyponatremia possible due to Potomania.  resolved  Generalized tonic-clonic seizure acute toxic metabolic encephalopathy: EEG negative Neurology has signed off  Hypokalemia: IV and oral replacement.   History of recurrent CVA Plavix held around time of biopsy.  Plan to resume Plavix and aspirin  today  Hypertension: With hypertensive urgency: Continue with  losartan and Coreg, BiDil  RA;  Tapering from a stress dose of steroid not on prednisone Usually takes 15 mg daily Holding Plaquenil and Enbrel  Polysubstance abuse, alcohol abuse tobacco abuse: Counseling provided  COPD no exacerbation Esophageal thickening:  Slurred speech: Multifactorial most likely due to encephalopathy/possibly paraneoplastic, dry mouth causing dysarthria. Subtle right-sided lower facial weakness is only focal deficit on exam. CT head is without acute stroke.  - Continue augmented lactulose Tx.  - Supportive care otherwise. Seems improved today. If continues and/or waxes/wanes, consider changing risperdal and other psychoactive medications.  Esophageal thickening: Appreciate GI consultation, patient was a started on PPI twice daily and nystatin for oral thrush  Depression: Continue with Celexa and risperidone  FTT,: Require skilled nursing facility for admission  Dysphagia; having trouble swallowing pills today.  Added Diflucan.  He relates overall improved from swallowing , dysphagia, but still exhibit some difficulty   Estimated body mass index is 24.37 kg/m as calculated from the following:   Height as of this encounter: 5\' 6"  (1.676 m).   Weight as of this encounter: 68.5 kg.   DVT prophylaxis: SCDs Code Status: Full code Family Communication: Care discussed with patient directly Disposition Plan:  Patient is from: Home Anticipated d/c date: In 2 to 3 days if he is able to tolerate chemotherapy Barriers to d/c or necessity for inpatient status: replacing Potasium IV and oral. K level at 2.7  Consultants:   Oncology  Neurology  PCCM  Radiation oncology  Palliative care  GI  Procedures:  -EEG -Flexible video fiberoptic bronchoscopy with endobronchial ultrasound and biopsies on March 2 by Dr. Shearon Stalls  Antimicrobials:  Doxycycline from 2/26--3/2  Subjective: Alert, he relates overall improvement of dysphagia, eating 50 % of meals. Difficulty swallowing some pill.  Objective: Vitals:   10/31/19 1954 11/01/19 0811 11/01/19 0904 11/01/19 1427  BP:   132/84 (!) 162/93  Pulse:   78 75  Resp:   18 16  Temp:   98.6 F (37 C) 98 F (36.7 C)  TempSrc:   Oral Oral  SpO2: 97% 91% 92% 95%  Weight:      Height:        Intake/Output Summary (Last 24 hours) at 11/01/2019 1529 Last data filed at 11/01/2019 1500 Gross per 24 hour  Intake 366.5 ml  Output 1570 ml  Net -1203.5 ml   Filed Weights   10/01/2019 2057 10/30/2019 1126 10/30/19 0700  Weight: 70 kg 70 kg 68.5 kg    Examination:  General exam: NAD Respiratory system: CTA Cardiovascular system: S 1, S 2 RRR Gastrointestinal system: BS present, soft, nt Central nervous system: Alert.  Extremities: No edema Skin: No rashes  Data Reviewed: I have personally reviewed following labs and imaging studies  CBC: Recent Labs  Lab 10/28/19 0608 10/29/19 0530 10/30/19 0443 10/31/19 0500 11/01/19 0938  WBC 13.6* 13.0*  11.1* 9.1 9.4  HGB 11.1* 11.0* 11.8* 9.7* 10.3*  HCT 33.2* 33.2* 35.2* 28.9* 30.9*  MCV 95.4 95.7 94.6 96.7 94.5  PLT 149* 153 142* 126* 707*   Basic Metabolic Panel: Recent Labs  Lab 10/27/19 0557 10/27/19 0557 10/28/19 0608 10/29/19 0530 10/30/19 0443 10/31/19 0500 11/01/19 0938  NA 141   < > 140 139 140 137 138  K 3.5   < > 3.2* 3.0* 2.7* 3.7 2.6*  CL 105   < > 103 103 103 102 100  CO2 27   < > 26 25 28 27 28   GLUCOSE 162*   < > 160* 164* 186* 203* 253*  BUN 25*   < > 26* 26* 23 21 18   CREATININE 0.55*   < > 0.66 0.71 0.56* 0.60* 0.52*  CALCIUM 8.2*   < > 8.0* 7.9* 8.2* 8.0* 7.6*  MG 2.1  --  2.0 1.9  --  1.8 1.8  PHOS  --   --   --   --   --  2.8 2.8   < > = values in this interval not displayed.   GFR: Estimated Creatinine Clearance: 84.2 mL/min (A) (by C-G formula based on SCr of 0.52 mg/dL (L)). Liver Function Tests: Recent Labs  Lab 10/27/19 0557 10/28/19 0608 10/29/19 0530 10/30/19 0443 10/31/19 0500  AST 113* 155* 144* 141* 101*  ALT 319* 349* 311* 338* 266*  ALKPHOS 360* 413* 412* 456*  337*  BILITOT 1.0 1.2 1.3* 1.4* 0.8  PROT 5.7* 5.8* 5.4* 5.7* 5.1*  ALBUMIN 2.9* 3.1* 2.7* 2.8* 2.3*   No results for input(s): LIPASE, AMYLASE in the last 168 hours. No results for input(s): AMMONIA in the last 168 hours. Coagulation Profile: Recent Labs  Lab 10/26/19 0548  INR 1.0   Cardiac Enzymes: No results for input(s): CKTOTAL, CKMB, CKMBINDEX, TROPONINI in the last 168 hours. BNP (last 3 results) No results for input(s): PROBNP in the last 8760 hours. HbA1C: No results for input(s): HGBA1C in the last 72 hours. CBG: No results for input(s): GLUCAP in the last 168 hours. Lipid Profile: No results for input(s): CHOL, HDL, LDLCALC, TRIG, CHOLHDL, LDLDIRECT in the last 72 hours. Thyroid Function Tests: No results for input(s): TSH, T4TOTAL, FREET4, T3FREE, THYROIDAB in the last 72 hours. Anemia Panel: No results for input(s): VITAMINB12, FOLATE, FERRITIN,  TIBC, IRON, RETICCTPCT in the last 72 hours. Sepsis Labs: No results for input(s): PROCALCITON, LATICACIDVEN in the last 168 hours.  Recent Results (from the past 240 hour(s))  SARS CORONAVIRUS 2 (TAT 6-24 HRS) Nasopharyngeal Nasopharyngeal Swab     Status: None   Collection Time: 10/31/19  4:28 PM   Specimen: Nasopharyngeal Swab  Result Value Ref Range Status   SARS Coronavirus 2 NEGATIVE NEGATIVE Final    Comment: (NOTE) SARS-CoV-2 target nucleic acids are NOT DETECTED. The SARS-CoV-2 RNA is generally detectable in upper and lower respiratory specimens during the acute phase of infection. Negative results do not preclude SARS-CoV-2 infection, do not rule out co-infections with other pathogens, and should not be used as the sole basis for treatment or other patient management decisions. Negative results must be combined with clinical observations, patient history, and epidemiological information. The expected result is Negative. Fact Sheet for Patients: SugarRoll.be Fact Sheet for Healthcare Providers: https://www.woods-mathews.com/ This test is not yet approved or cleared by the [place] FDA and  has been authorized for detection and/or diagnosis of SARS-CoV-2 by FDA under an Emergency Use Authorization (EUA). This EUA will remain  in effect (meaning this test can be used) for the duration of the COVID-19 declaration under Section 56 4(b)(1) of the Act, 21 U.S.C. section 360bbb-3(b)(1), unless the authorization is terminated or revoked sooner. Performed at [facility], [place] [address] [place], [place] [address]          Radiology Studies: No results found.      Scheduled Meds: . amLODipine  10 mg Oral Daily  . aspirin EC  81 mg Oral Daily  . carvedilol  12.5 mg Oral BID WC  . Chlorhexidine Gluconate Cloth  6 each Topical Daily  . clopidogrel  75 mg Oral Daily  . feeding supplement (ENSURE ENLIVE)  237 mL Oral  BID BM  . guaiFENesin  600 mg Oral BID  . isosorbide-hydrALAZINE  1 tablet Oral TID  . lactulose  30 g Oral BID  . lidocaine  15 mL Mouth/Throat TID AC  . losartan  50 mg Oral Daily  . mometasone-formoterol  2 puff Inhalation BID  . nystatin  5 mL Oral QID  . pantoprazole  40 mg Oral BID  . potassium chloride  40 mEq Oral BID  . predniSONE  20 mg Oral QAC breakfast  . risperiDONE  1 mg Oral QHS  . senna-docusate  1 tablet Oral QHS  . sodium chloride flush  10-40 mL Intracatheter Q12H  . Tbo-Filgrastim  300 mcg Subcutaneous Daily  . traZODone  50 mg Oral  QHS  . umeclidinium bromide  1 puff Inhalation Daily   Continuous Infusions: . sodium chloride    . sodium chloride    . famotidine (PEPCID) IV (ONCOLOGY)    . fluconazole (DIFLUCAN) IV 50 mL/hr at 11/01/19 1035  . potassium chloride 10 mEq (11/01/19 1454)     LOS: 15 days    Time spent: 35 minutes.     Elmarie Shiley, MD [facility]   If 7PM-7AM, please contact night-coverage [email or web address]  11/01/2019, 3:29 PM

## 2019-11-01 NOTE — Progress Notes (Signed)
eLink Physician-Brief Progress Note Patient Name: Drew Nguyen DOB: 1955-09-29 MRN: [id number]   Date of Service  11/01/2019  HPI/Events of Note  Dr Linda Hedges MD discussed about active hempotysis with stable BP.  Oncology notes, labs reviewed.  Camera: Sitting up and coughing blood. As per bed side RN, has coughed up 500 ml since 8 PM. MAP fine, sats good. Gets sob post coughing. Using napkin to clear blood from mouth.   Meds seen. Not on AC or heparin or LMWH. Got plavix in am.  eICU Interventions  Plan: - Need CTA/IR embolization. So discussed with Dr Deatra Canter. And decided to get him to [facility]. Dr Linda Hedges updated and he is arranging him to be transferred here soon.         Elmer Sow 11/01/2019, 9:36 PM

## 2019-11-01 NOTE — Progress Notes (Addendum)
eLink Physician-Brief Progress Note Patient Name: AZAD CALAME DOB: [birth year] MRN: [id number]   Date of Service  11/01/2019  HPI/Events of Note  K was low. NPO for now.   eICU Interventions  Get K/Mag and Hg Type and screen stat and call if low for IV Kcl. Getting transferred to [facility] soon.         Elmer Sow 11/01/2019, 9:54 PM   23:04 Camera: Told EMS to keep him sitting up during transfer. ETA 10 mins to [facility]. VS stable. Still coughing up blood 5 2 to 5 ml .  Dr Deatra Canter had spoken to IR in [facility].  Might need to intubate if worsens with angiogram for BAE.

## 2019-11-01 NOTE — Consult Note (Signed)
NAME:  Drew Nguyen, MRN:  [id number], DOB:  [birth year], LOS: 67 ADMISSION DATE:  10/18/2019, CONSULTATION DATE:  11/01/19 REFERRING MD:  Linda Hedges, CHIEF COMPLAINT:  hemoptysis   Brief History   64 y.o.M who was initially admitted 10/17/2019 for seizure and found to have LUL mass with endobronchial collapse with bronch revealing small cell carcinoma who developed large volume hemoptysis this evening and so was transferred from [facility] hospital to [name] for IR evaluation.  History of present illness   Drew Nguyen is a 64 y.o. M with PMH significant for CVAx2, ETOH abuse, COPD, RA on prednisone and plaquenil who was initially admitted 2/262021 for seizure and found to have LUL mass with endobronchial collapse with bronch revealing small cell carcinoma and further imaging revealing liver mets. Seen by oncology and started palliative chemotherapy on 3/10 with Carboplatin AUC 4 on Day 1, etoposide 60mg /m2 on Days 1-2.    Earlier this evening pt began having significant hemoptysis, initial oxygen saturations stable and patient awake.  Plavix was resumed this morning.  Hemoptysis became significant, so decision made to transfer from [facility] to [facility] ICU.  Past Medical History   has a past medical history of COPD (chronic obstructive pulmonary disease) ([place]), Current smoker, Depression, Hypertension, RA (rheumatoid arthritis) ([place]), and Tobacco abuse.  Significant Hospital Events   2/26 Admit for seizures  3/10 Chemotherapy initiated 3/13 Pt developed hemoptysis, txfr to [facility]  Consults:  IR PCCM  Procedures:  3/2 EBUS  Significant Diagnostic Tests:  3/2 LUL bx>>small cell carcinoma 2/26 CT chest>>Left hilar mass and associated mediastinal and left hilar adenopathy, compatible with malignancy 3/5 MR Liver>>diffuse liver mets  Micro Data:  3/12 RVP>>negative  Antimicrobials:   Fluconazole 3/12- Doxycycline 2/26-3/2  Interim history/subjective:  Pt transported to [facility]  Objective   Blood  pressure 137/72, pulse 74, temperature (!) 97.5 F (36.4 C), temperature source Oral, resp. rate (!) 21, height 5\' 6"  (1.676 m), weight 62.5 kg, SpO2 90 %.        Intake/Output Summary (Last 24 hours) at 11/01/2019 2241 Last data filed at 11/01/2019 1700 Gross per 24 hour  Intake 366.5 ml  Output 1670 ml  Net -1303.5 ml   Filed Weights   11/05/2019 1126 10/30/19 0700 11/01/19 1941  Weight: 70 kg 68.5 kg 62.5 kg    General:  Chronically ill appearing M actively coughing up blood HEENT: MM pink/moist, massive hemoptysis Neuro: awake, but confused CV: s1s2 rrr, no m/r/g PULM:  Poor air movement bilateral bases GI: soft, bsx4 active  Extremities: warm/dry, 1+ edema  Skin: no rashes or lesions   Resolved Hospital Problem list   Seizures LUL CAP  Assessment & Plan:   Acute Massive Hemoptysis -hemodynamically stable thus far, type and screen obtained -given CT finding of extrinsic compression of the LUL pulmonary artery there is concern this could be the source of bleeding -bedside bronch with cold water instillation felt unlikely to be helpful in the setting of massive bleed P: -intubated on arrival, IR notified and requests CTA  -serial h/h, transfuse as needed -TXA 500mg  tid via neb -Maintain full vent support with SAT/SBT as tolerated -titrate Vent setting to maintain SpO2 greater than or equal to 90%. -HOB elevated 30 degrees. -Plateau pressures less than 30 cm H20.  -Follow chest x-ray, ABG prn.   -Bronchial hygiene and RT/bronchodilator protocol.     Small Cell Lung Carcinoma of the LUL with liver metastases  -Oncology following for palliative radiation, not a curative  treatment given extensive stage  -on Granix for 7 days -s/p Lactulose for hepatic encephalopathy   Seizures, likely secondary to acute toxic metabolic encephalopathy -seen by Neurology, EEG negative, no anti-epileptic medications and no etiology on head CT   Dysphagia -Fluconazole started, will  continue  -Continue Pepcid    Rheumatoid Arthritis -tapering from stress dose steroid, on 20mg  daily, on chronic Prednisone 15mg  -hold Plaquenil and Enbrel   Depression  -hold Celexa and Risperdone due to NPO status  History of CVA -plavix and Asa resumed today, hold in the setting of acute bleeding   Hypokalemia  -K 2.6 this evening, repleted P: -repeat K and Mag levels in the morning   HTN -hold Coreg and Cozaar in the setting of acute bleeding   COPD -continue dulera, albuterol nebs   Best practice:  Diet: NPO Pain/Anxiety/Delirium protocol (if indicated): propofol, fentanyl VAP protocol (if indicated): HOB 30 degrees DVT prophylaxis: scd's GI prophylaxis: pepcid Glucose control: SSI Mobility: bed rest Code Status: full code Family Communication: Pt's brother updated Disposition: ICU  Labs   CBC: Recent Labs  Lab 10/29/19 0530 10/30/19 0443 10/31/19 0500 11/01/19 0938 11/01/19 1907  WBC 13.0* 11.1* 9.1 9.4 12.8*  NEUTROABS  --   --   --   --  10.4*  HGB 11.0* 11.8* 9.7* 10.3* 9.5*  HCT 33.2* 35.2* 28.9* 30.9* 28.3*  MCV 95.7 94.6 96.7 94.5 96.6  PLT 153 142* 126* 147* 517    Basic Metabolic Panel: Recent Labs  Lab 10/27/19 0557 10/27/19 0557 10/28/19 0608 10/29/19 0530 10/30/19 0443 10/31/19 0500 11/01/19 0938  NA 141   < > 140 139 140 137 138  K 3.5   < > 3.2* 3.0* 2.7* 3.7 2.6*  CL 105   < > 103 103 103 102 100  CO2 27   < > 26 25 28 27 28   GLUCOSE 162*   < > 160* 164* 186* 203* 253*  BUN 25*   < > 26* 26* 23 21 18   CREATININE 0.55*   < > 0.66 0.71 0.56* 0.60* 0.52*  CALCIUM 8.2*   < > 8.0* 7.9* 8.2* 8.0* 7.6*  MG 2.1  --  2.0 1.9  --  1.8 1.8  PHOS  --   --   --   --   --  2.8 2.8   < > = values in this interval not displayed.   GFR: Estimated Creatinine Clearance: 82.5 mL/min (A) (by C-G formula based on SCr of 0.52 mg/dL (L)). Recent Labs  Lab 10/30/19 0443 10/31/19 0500 11/01/19 0938 11/01/19 1907  WBC 11.1* 9.1 9.4  12.8*    Liver Function Tests: Recent Labs  Lab 10/27/19 0557 10/28/19 0608 10/29/19 0530 10/30/19 0443 10/31/19 0500  AST 113* 155* 144* 141* 101*  ALT 319* 349* 311* 338* 266*  ALKPHOS 360* 413* 412* 456* 337*  BILITOT 1.0 1.2 1.3* 1.4* 0.8  PROT 5.7* 5.8* 5.4* 5.7* 5.1*  ALBUMIN 2.9* 3.1* 2.7* 2.8* 2.3*   No results for input(s): LIPASE, AMYLASE in the last 168 hours. No results for input(s): AMMONIA in the last 168 hours.  ABG No results found for: PHART, PCO2ART, PO2ART, HCO3, TCO2, ACIDBASEDEF, O2SAT   Coagulation Profile: Recent Labs  Lab 10/26/19 0548  INR 1.0    Cardiac Enzymes: No results for input(s): CKTOTAL, CKMB, CKMBINDEX, TROPONINI in the last 168 hours.  HbA1C: Hgb A1c MFr Bld  Date/Time Value Ref Range Status  06/06/2019 03:27 AM 5.4 4.8 - 5.6 %  Final    Comment:    (NOTE) Pre diabetes:          5.7%-6.4% Diabetes:              >6.4% Glycemic control for   <7.0% adults with diabetes   04/20/2019 05:32 AM 5.1 4.8 - 5.6 % Final    Comment:    (NOTE) Pre diabetes:          5.7%-6.4% Diabetes:              >6.4% Glycemic control for   <7.0% adults with diabetes     CBG: No results for input(s): GLUCAP in the last 168 hours.  Review of Systems:   Unable to obtain secondary to AMS  Past Medical History  He,  has a past medical history of COPD (chronic obstructive pulmonary disease) ([place]), Current smoker, Depression, Hypertension, RA (rheumatoid arthritis) ([place]), and Tobacco abuse.   Surgical History    Past Surgical History:  Procedure Laterality Date  . ANKLE SURGERY    . BIOPSY  10/30/2019   Procedure: BIOPSY;  Surgeon: Spero Geralds, MD;  Location: [facility] ENDOSCOPY;  Service: Pulmonary;;  . BRONCHIAL BRUSHINGS  11/04/2019   Procedure: BRONCHIAL BRUSHINGS;  Surgeon: Spero Geralds, MD;  Location: [facility] ENDOSCOPY;  Service: Pulmonary;;  . BRONCHIAL NEEDLE ASPIRATION BIOPSY  11/06/2019   Procedure: BRONCHIAL NEEDLE ASPIRATION BIOPSIES;   Surgeon: Spero Geralds, MD;  Location[facility] ENDOSCOPY;  Service: Pulmonary;;  . ENDOBRONCHIAL ULTRASOUND  11/06/2019   Procedure: ENDOBRONCHIAL ULTRASOUND;  Surgeon: Spero Geralds, MD;  Location: [facility] ENDOSCOPY;  Service: Pulmonary;;  . MULTIPLE TOOTH EXTRACTIONS    . VIDEO BRONCHOSCOPY WITH ENDOBRONCHIAL ULTRASOUND N/A 10/24/2019   Procedure: VIDEO BRONCHOSCOPY;  Surgeon: Spero Geralds, MD;  Location: [facility] ENDOSCOPY;  Service: Pulmonary;  Laterality: N/A;     Social History   reports that he has been smoking cigarettes. He has a 72.00 pack-year smoking history. He has never used smokeless tobacco. He reports current alcohol use. He reports current drug use. Drugs: Cocaine and Benzodiazepines.   Family History   His family history includes Brain cancer in his brother; Colon cancer in his maternal aunt; Heart attack in his father and mother; Hypertension in his mother. There is no history of Stomach cancer, Esophageal cancer, or Pancreatic cancer.   Allergies Allergies  Allergen Reactions  . Celexa [Citalopram] Other (See Comments)    Over sedation Sleeps too much     Home Medications  Prior to Admission medications   Medication Sig Start Date End Date Taking? Authorizing Provider  acetaminophen (TYLENOL) 500 MG tablet Take 500 mg by mouth every 6 (six) hours as needed for mild pain, fever or headache.    Yes [provider]  alendronate (FOSAMAX) 70 MG tablet Take 70 mg by mouth once a week. 10/03/19  Yes [provider]  amLODipine (NORVASC) 5 MG tablet Take 1 tablet (5 mg total) by mouth daily. 04/20/19 04/19/20 Yes Danford, Suann Larry, MD  CALCIUM PO Take 1 tablet by mouth daily.   Yes [provider]  cholecalciferol (VITAMIN D) 25 MCG tablet Take 1 tablet (1,000 Units total) by mouth daily. 06/07/19 10/17/19 Yes Dahal, Marlowe Aschoff, MD  citalopram (CELEXA) 10 MG tablet Take 10 mg by mouth daily.   Yes [provider]  clopidogrel (PLAVIX) 75 MG tablet Take  1 tablet (75 mg total) by mouth daily. 06/07/19 10/17/19 Yes Dahal, Marlowe Aschoff, MD  ENBREL 50 MG/ML injection Inject 1 mL  into the skin once a week. 10/14/19  Yes [provider]  EQ ASPIRIN ADULT LOW DOSE 81 MG EC tablet Take 81 mg by mouth daily. 07/12/19  Yes [provider]  furosemide (LASIX) 20 MG tablet Take 10-20 mg by mouth See admin instructions. Take 20mg  in the morning and 10mg  in the evening. 10/14/19  Yes [provider]  hydroxychloroquine (PLAQUENIL) 200 MG tablet Take 200 mg by mouth daily. 10/08/19  Yes [provider]  ipratropium (ATROVENT) 0.03 % nasal spray Place 2 sprays into both nostrils 2 (two) times daily as needed for rhinitis.  08/05/19  Yes [provider]  losartan (COZAAR) 25 MG tablet Take 25 mg by mouth daily. 10/14/19  Yes [provider]  meloxicam (MOBIC) 7.5 MG tablet Take 7.5 mg by mouth See admin instructions. 1 to 2 times daily    Yes [provider]  nitroGLYCERIN (NITROSTAT) 0.4 MG SL tablet Place 0.4 mg under the tongue every 5 (five) minutes as needed for chest pain. 10/14/19  Yes [provider]  omeprazole (PRILOSEC) 20 MG capsule Take 1 capsule (20 mg total) by mouth daily. 06/06/19 10/17/19 Yes Dahal, Marlowe Aschoff, MD  potassium chloride (KLOR-CON) 10 MEQ tablet Take 10 mEq by mouth 2 (two) times daily.   Yes [provider]  predniSONE (DELTASONE) 10 MG tablet Take 5 mg by mouth in the morning and at bedtime.  08/23/18  Yes [provider]  rosuvastatin (CRESTOR) 5 MG tablet Take 5 mg by mouth every evening.   Yes [provider]  SPIRIVA HANDIHALER 18 MCG inhalation capsule Place 18 mcg into inhaler and inhale daily. 10/15/19  Yes [provider]     Critical care time: 62 minutes     CRITICAL CARE Performed by: Otilio Carpen Darianny Momon   Total critical care time: 62 minutes  Critical care time was exclusive of separately billable procedures and treating other  patients.  Critical care was necessary to treat or prevent imminent or life-threatening deterioration.  Critical care was time spent personally by me on the following activities: development of treatment plan with patient and/or surrogate as well as nursing, discussions with consultants, evaluation of patient's response to treatment, examination of patient, obtaining history from patient or surrogate, ordering and performing treatments and interventions, ordering and review of laboratory studies, ordering and review of radiographic studies, pulse oximetry and re-evaluation of patient's condition.   Otilio Carpen Axle Parfait, PA-C

## 2019-11-01 NOTE — Progress Notes (Addendum)
Cross cover   Was asked by Dr. Tyrell Antonio to see patient for new onset hemopytsis that started this evening. He was transferred to 1231 for closer care. He is being treated for lung cancer. Reviewed last progress notes and chart.  Patient voices complaint of getting short of breath with coughing. He continues to have hemoptysis with approiximately 30 cc frank blood in suction cannister.  PE Vitals - stable Pul - patient is moving air well. Frequent cough Cor- RRR  Lab: Hgb 10.3 @ 0930, 9.5 @ 1907  A/P Hemoptysis - patient with lung cancer now with active hemoptysis. He is at this time hemodynamically stably  Plan 1. H/H @ 2300 hrs.  2. Called CCM and spoke with Dr.Mohan - made aware of patient. He will review.  Addendum: patient with continued hemoptysis. Dr. Prudencio Burly recommends transfer to [facility] ICU, Dr. Unknown Foley accepting MD, for CTA and IR intervention if needed  M. [place], MD [place] (c) [phone]

## 2019-11-01 NOTE — Progress Notes (Signed)
Notified by bedside RN, Verline Lema, in regards to patient having hemoptysis. Patient coughing up clots upon arrival to patient's room. Patient alert and oriented x4, O2 Sats 92% on 2-4LNC, BP within normal limits, no apparent respiratory distress. Will transfer to SDU for closer monitoring. MD Regalado ordered transfer, CXR, and STAT CBC.

## 2019-11-01 NOTE — Progress Notes (Signed)
CRITICAL VALUE ALERT  Critical Value:  K+ 2.7  Date & Time Notied:  11/01/19 1100  Provider Notified: Regalado  Orders Received/Actions taken: see orders

## 2019-11-02 ENCOUNTER — Inpatient Hospital Stay (HOSPITAL_COMMUNITY): Payer: Medicaid Other

## 2019-11-02 ENCOUNTER — Encounter (HOSPITAL_COMMUNITY): Payer: Self-pay | Admitting: Internal Medicine

## 2019-11-02 DIAGNOSIS — G988 Other disorders of nervous system: Secondary | ICD-10-CM

## 2019-11-02 DIAGNOSIS — C801 Malignant (primary) neoplasm, unspecified: Secondary | ICD-10-CM

## 2019-11-02 DIAGNOSIS — G9341 Metabolic encephalopathy: Secondary | ICD-10-CM

## 2019-11-02 DIAGNOSIS — R042 Hemoptysis: Secondary | ICD-10-CM

## 2019-11-02 DIAGNOSIS — I6300 Cerebral infarction due to thrombosis of unspecified precerebral artery: Secondary | ICD-10-CM

## 2019-11-02 DIAGNOSIS — J9601 Acute respiratory failure with hypoxia: Secondary | ICD-10-CM

## 2019-11-02 DIAGNOSIS — C3490 Malignant neoplasm of unspecified part of unspecified bronchus or lung: Secondary | ICD-10-CM

## 2019-11-02 DIAGNOSIS — J440 Chronic obstructive pulmonary disease with acute lower respiratory infection: Secondary | ICD-10-CM

## 2019-11-02 LAB — MAGNESIUM: Magnesium: 2.2 mg/dL (ref 1.7–2.4)

## 2019-11-02 LAB — CBC
HCT: 22.4 % — ABNORMAL LOW (ref 39.0–52.0)
HCT: 25.1 % — ABNORMAL LOW (ref 39.0–52.0)
HCT: 24.3 % — ABNORMAL LOW (ref 39.0–52.0)
Hemoglobin: 7.2 g/dL — ABNORMAL LOW (ref 13.0–17.0)
Hemoglobin: 8.3 g/dL — ABNORMAL LOW (ref 13.0–17.0)
Hemoglobin: 7.9 g/dL — ABNORMAL LOW (ref 13.0–17.0)
MCH: 31.2 pg (ref 26.0–34.0)
MCH: 31.4 pg (ref 26.0–34.0)
MCH: 31.1 pg (ref 26.0–34.0)
MCHC: 32.1 g/dL (ref 30.0–36.0)
MCHC: 33.1 g/dL (ref 30.0–36.0)
MCHC: 32.5 g/dL (ref 30.0–36.0)
MCV: 97 fL (ref 80.0–100.0)
MCV: 95.1 fL (ref 80.0–100.0)
MCV: 95.7 fL (ref 80.0–100.0)
Platelets: 189 10*3/uL (ref 150–400)
Platelets: 129 10*3/uL — ABNORMAL LOW (ref 150–400)
Platelets: 124 10*3/uL — ABNORMAL LOW (ref 150–400)
RBC: 2.31 MIL/uL — ABNORMAL LOW (ref 4.22–5.81)
RBC: 2.64 MIL/uL — ABNORMAL LOW (ref 4.22–5.81)
RBC: 2.54 MIL/uL — ABNORMAL LOW (ref 4.22–5.81)
RDW: 13.3 % (ref 11.5–15.5)
RDW: 13.3 % (ref 11.5–15.5)
RDW: 13.5 % (ref 11.5–15.5)
WBC: 20.2 10*3/uL — ABNORMAL HIGH (ref 4.0–10.5)
WBC: 10 10*3/uL (ref 4.0–10.5)
WBC: 8.9 10*3/uL (ref 4.0–10.5)
nRBC: 0 % (ref 0.0–0.2)
nRBC: 0 % (ref 0.0–0.2)
nRBC: 0 % (ref 0.0–0.2)

## 2019-11-02 LAB — COMPREHENSIVE METABOLIC PANEL
ALT: 194 U/L — ABNORMAL HIGH (ref 0–44)
AST: 73 U/L — ABNORMAL HIGH (ref 15–41)
Albumin: 2.1 g/dL — ABNORMAL LOW (ref 3.5–5.0)
Alkaline Phosphatase: 314 U/L — ABNORMAL HIGH (ref 38–126)
Anion gap: 9 (ref 5–15)
BUN: 25 mg/dL — ABNORMAL HIGH (ref 8–23)
CO2: 28 mmol/L (ref 22–32)
Calcium: 7.4 mg/dL — ABNORMAL LOW (ref 8.9–10.3)
Chloride: 101 mmol/L (ref 98–111)
Creatinine, Ser: 0.64 mg/dL (ref 0.61–1.24)
GFR calc Af Amer: >60 mL/min (ref >60)
GFR calc non Af Amer: >60 mL/min (ref >60)
Glucose, Bld: 195 mg/dL — ABNORMAL HIGH (ref 70–99)
Potassium: 3.7 mmol/L (ref 3.5–5.1)
Sodium: 138 mmol/L (ref 135–145)
Total Bilirubin: 1.2 mg/dL (ref 0.3–1.2)
Total Protein: 4.7 g/dL — ABNORMAL LOW (ref 6.5–8.1)

## 2019-11-02 LAB — POCT I-STAT 7, (LYTES, BLD GAS, ICA,H+H)
Acid-Base Excess: 4 mmol/L — ABNORMAL HIGH (ref 0.0–2.0)
Bicarbonate: 29.3 mmol/L — ABNORMAL HIGH (ref 20.0–28.0)
Calcium, Ion: 1.08 mmol/L — ABNORMAL LOW (ref 1.15–1.40)
HCT: 20 % — ABNORMAL LOW (ref 39.0–52.0)
Hemoglobin: 6.8 g/dL — CL (ref 13.0–17.0)
O2 Saturation: 93 %
Patient temperature: 98.3
Potassium: 3.8 mmol/L (ref 3.5–5.1)
Sodium: 139 mmol/L (ref 135–145)
TCO2: 31 mmol/L (ref 22–32)
pCO2 arterial: 46.2 mmHg (ref 32.0–48.0)
pH, Arterial: 7.41 (ref 7.350–7.450)
pO2, Arterial: 68 mmHg — ABNORMAL LOW (ref 83.0–108.0)

## 2019-11-02 LAB — PREPARE RBC (CROSSMATCH)

## 2019-11-02 LAB — BLOOD GAS, ARTERIAL
Acid-Base Excess: 1.6 mmol/L (ref 0.0–2.0)
Bicarbonate: 30.3 mmol/L — ABNORMAL HIGH (ref 20.0–28.0)
FIO2: 100
O2 Saturation: 91 %
Patient temperature: 36.4
pCO2 arterial: 95 mmHg (ref 32.0–48.0)
pH, Arterial: 7.126 — CL (ref 7.350–7.450)
pO2, Arterial: 82.7 mmHg — ABNORMAL LOW (ref 83.0–108.0)

## 2019-11-02 LAB — TRIGLYCERIDES: Triglycerides: 148 mg/dL (ref <150)

## 2019-11-02 LAB — GLUCOSE, CAPILLARY
Glucose-Capillary: 212 mg/dL — ABNORMAL HIGH (ref 70–99)
Glucose-Capillary: 170 mg/dL — ABNORMAL HIGH (ref 70–99)
Glucose-Capillary: 156 mg/dL — ABNORMAL HIGH (ref 70–99)
Glucose-Capillary: 175 mg/dL — ABNORMAL HIGH (ref 70–99)
Glucose-Capillary: 167 mg/dL — ABNORMAL HIGH (ref 70–99)

## 2019-11-02 LAB — ABO/RH: ABO/RH(D): A POS

## 2019-11-02 IMAGING — CT CT ANGIO CHEST
2 of 7 series · 15 of 46 positions shown · IV contrast (APPLIED)
Comparison: Chest radiograph dated [DATE]. CT chest dated
[DATE].
COMPARISON: Chest radiograph dated [DATE]. CT chest dated
[DATE].

Addendum:
CLINICAL DATA: Hemoptysis

EXAM:
CT ANGIOGRAPHY CHEST WITH CONTRAST
TECHNIQUE: Multidetector CT imaging of the chest was performed using the
standard protocol during bolus administration of intravenous
contrast. Multiplanar CT image reconstructions and MIPs were
obtained to evaluate the vascular anatomy.
CONTRAST:  100mL OMNIPAQUE IOHEXOL 350 MG/ML SOLN

[Series 7: thins · axial · 0.67mm/px · z∈[-260,+23]mm · 12 of 456 slices shown]
[im 26/456  lung]
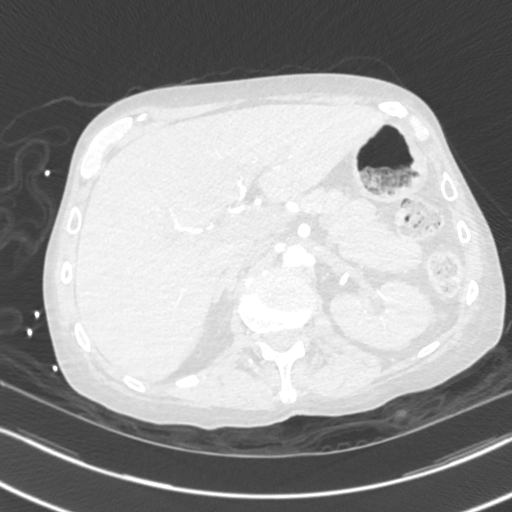
[im 76/456  soft-tissue]
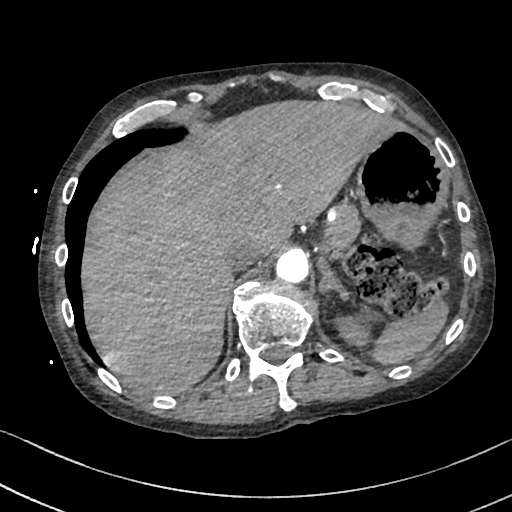
[im 102/456  lung]
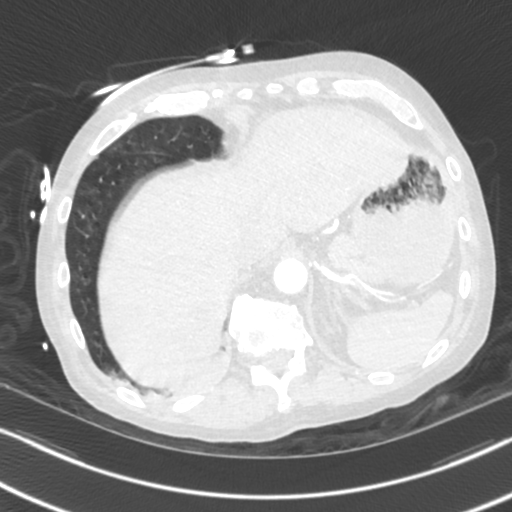
[im 127/456  soft-tissue]
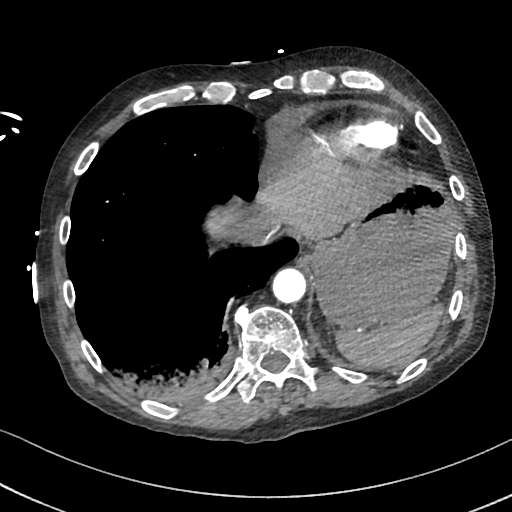
[im 177/456  lung]
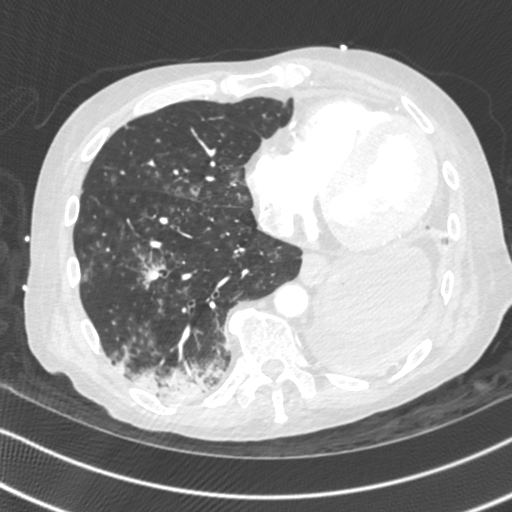
[im 203/456  soft-tissue]
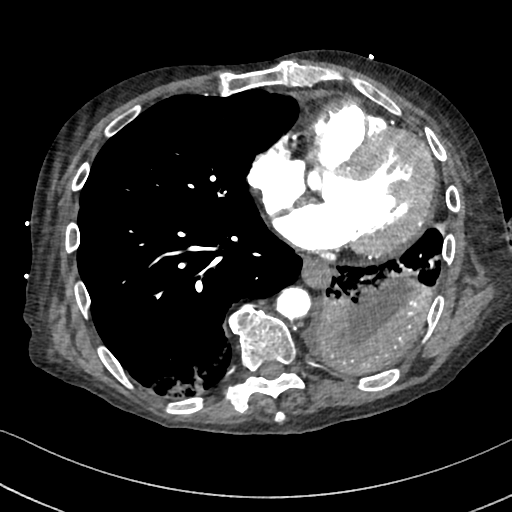
[im 253/456  lung]
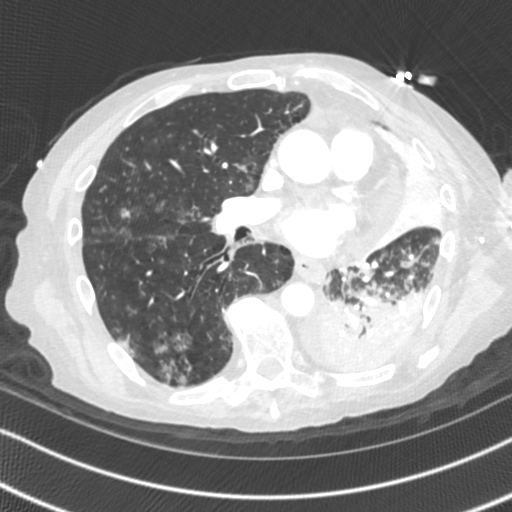
[im 279/456  soft-tissue]
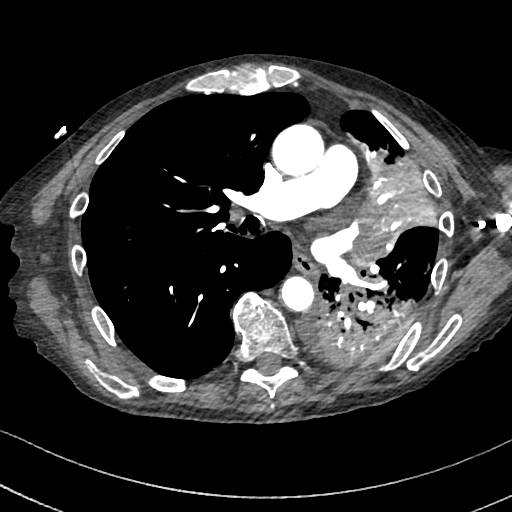
[im 329/456  lung]
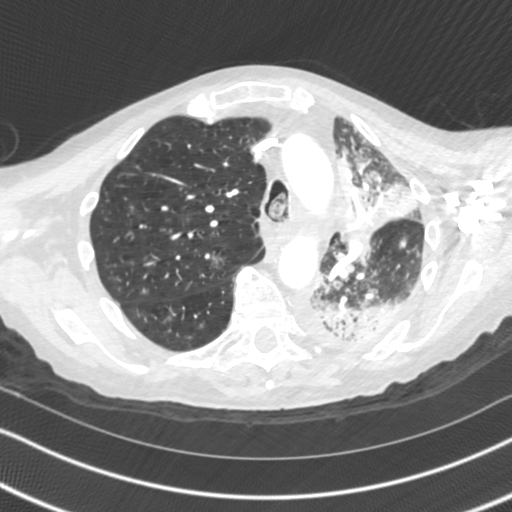
[im 354/456  soft-tissue]
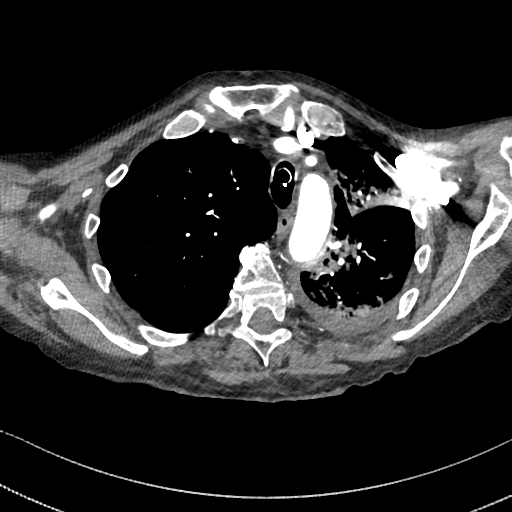
[im 380/456  lung]
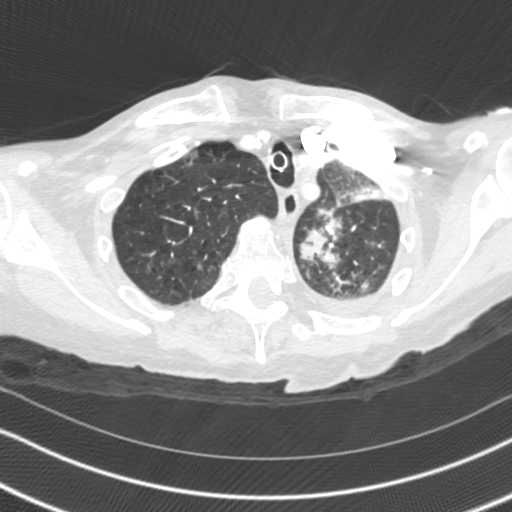
[im 430/456  soft-tissue]
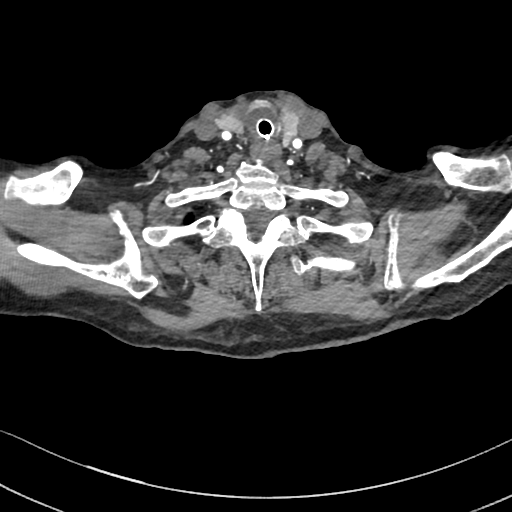

[Series 9: cor · coronal · 0.63mm/px · 3 of 134 slices shown]
[im 34/134  soft-tissue]
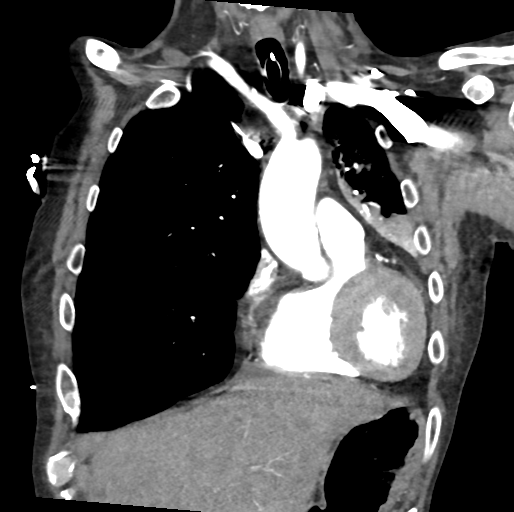
[im 67/134  soft-tissue]
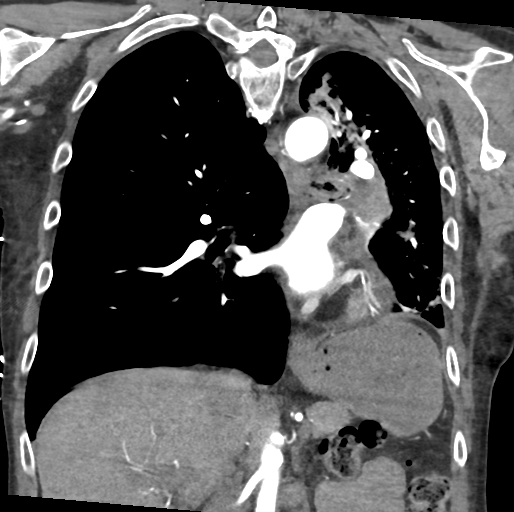
[im 100/134  soft-tissue]
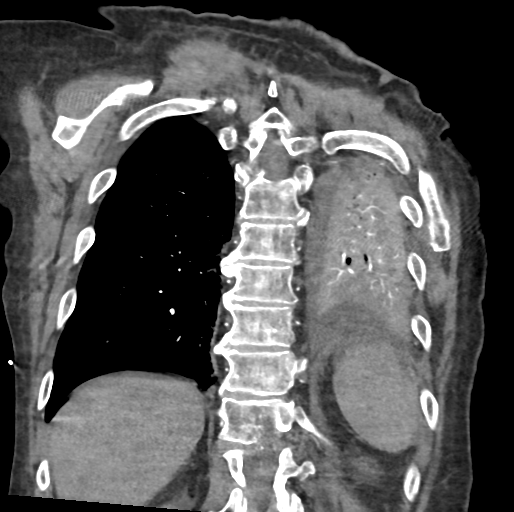

[15 of 46 positions shown; findings below may reference images not displayed]

FINDINGS: Cardiovascular: Satisfactory opacification the pulmonary arteries to
the segmental level. No evidence of pulmonary embolism.

Although not tailored for evaluation of the thoracic aorta, there is
no evidence of thoracic aortic aneurysm or dissection. Mild
atherosclerotic calcifications of the aortic arch.

The heart is top-normal in size. Small pericardial effusion
inferiorly. Leftward cardiomediastinal shift.

Mediastinum/Nodes: 1.9 cm short axis AP window node, previously
cm.

Additional tumor in the left hilar region, indistinguishable from
the patient's known left upper lobe mass (described below).

Lungs/Pleura: Endotracheal tube terminates 2 cm above the carina.

Debris in the distal trachea (series 8/image 46) extending into the
right mainstem bronchus (series 8/image 58) as well as the left
mainstem bronchus and left lower lobe airway (series 8/image 56).

Central left upper lobe mass extending to the left hilar region,
difficult to discretely measure, but likely at least 2.5 x 4.0 cm
(series 6/image 58). This may have measures 3.4 x 5.5 cm on the
prior CT. Surrounding postobstructive opacity/atelectasis in the
left upper lobe.

Left lower lobe atelectasis/collapse, with additional patchy/nodular
opacities in the left upper and lower lobes, suggesting pneumonia.

Additional patchy opacity in the posterior right lower lobe (series
8/image 112), suggesting atelectasis, however there are
patchy/nodular opacities throughout the right lung which suggests
multifocal pneumonia.

Mild centrilobular and paraseptal emphysematous changes.

Volume loss in the left hemithorax. Small left pleural effusion. No
pneumothorax.

Upper Abdomen: Mild thickening of the left adrenal gland without
discrete mass. Although poorly visualized, there are innumerable
hepatic metastases, measuring up to 2.5 cm in the central liver
(series 6/image 146). These are better evaluated on recent MRI.

Musculoskeletal: Degenerative changes of the visualized
thoracolumbar spine. Mild superior endplate changes at L1.

Review of the MIP images confirms the above findings.
IMPRESSION: No evidence of pulmonary embolism.

Left upper lobe mass extending into the left hilar region, possibly
mildly decreased from prior CT.

AP window nodal metastasis, mildly decreased.

Multifocal pneumonia. Postobstructive opacity in the left upper
lobe. Dependent atelectasis in the bilateral lower lobes, left
greater than right.

Small left pleural effusion.

Endotracheal tube terminates 2 cm above the carina. Debris in the
lower trachea and bilateral mainstem bronchi, as above.

Numerous hepatic metastases, better evaluated on recent MRI.

Aortic Atherosclerosis ([3T]-[3T]) and Emphysema ([3T]-[3T]).

ADDENDUM:
Case discussed with Dr. JHONNY GERMAN at [DATE]. Patient has active massive
hemoptysis.

The debris within the trachea and mainstem bronchi corresponds to
hemorrhage. Some of the patchy opacities in the lungs bilaterally
may reflect hemorrhage rather than infection, particularly in the
superior segment left lower lobe (for example, series 8/image 31).
However, when closely evaluating this region, it still appears to
have a pulmonary arterial/venous supply. An aberrant bronchial
artery source is not definitely identified.

Prior study also noted narrowing of a left upper lobe pulmonary
artery anteriorly. This is improved on the current study, presumably
following chemotherapy.

*** End of Addendum ***
FINDINGS: Cardiovascular: Satisfactory opacification the pulmonary arteries to
the segmental level. No evidence of pulmonary embolism.

Although not tailored for evaluation of the thoracic aorta, there is
no evidence of thoracic aortic aneurysm or dissection. Mild
atherosclerotic calcifications of the aortic arch.

The heart is top-normal in size. Small pericardial effusion
inferiorly. Leftward cardiomediastinal shift.

Mediastinum/Nodes: 1.9 cm short axis AP window node, previously
cm.

Additional tumor in the left hilar region, indistinguishable from
the patient's known left upper lobe mass (described below).

Lungs/Pleura: Endotracheal tube terminates 2 cm above the carina.

Debris in the distal trachea (series 8/image 46) extending into the
right mainstem bronchus (series 8/image 58) as well as the left
mainstem bronchus and left lower lobe airway (series 8/image 56).

Central left upper lobe mass extending to the left hilar region,
difficult to discretely measure, but likely at least 2.5 x 4.0 cm
(series 6/image 58). This may have measures 3.4 x 5.5 cm on the
prior CT. Surrounding postobstructive opacity/atelectasis in the
left upper lobe.

Left lower lobe atelectasis/collapse, with additional patchy/nodular
opacities in the left upper and lower lobes, suggesting pneumonia.

Additional patchy opacity in the posterior right lower lobe (series
8/image 112), suggesting atelectasis, however there are
patchy/nodular opacities throughout the right lung which suggests
multifocal pneumonia.

Mild centrilobular and paraseptal emphysematous changes.

Volume loss in the left hemithorax. Small left pleural effusion. No
pneumothorax.

Upper Abdomen: Mild thickening of the left adrenal gland without
discrete mass. Although poorly visualized, there are innumerable
hepatic metastases, measuring up to 2.5 cm in the central liver
(series 6/image 146). These are better evaluated on recent MRI.

Musculoskeletal: Degenerative changes of the visualized
thoracolumbar spine. Mild superior endplate changes at L1.

Review of the MIP images confirms the above findings.
IMPRESSION: No evidence of pulmonary embolism.

Left upper lobe mass extending into the left hilar region, possibly
mildly decreased from prior CT.

AP window nodal metastasis, mildly decreased.

Multifocal pneumonia. Postobstructive opacity in the left upper
lobe. Dependent atelectasis in the bilateral lower lobes, left
greater than right.

Small left pleural effusion.

Endotracheal tube terminates 2 cm above the carina. Debris in the
lower trachea and bilateral mainstem bronchi, as above.

Numerous hepatic metastases, better evaluated on recent MRI.

Aortic Atherosclerosis ([3T]-[3T]) and Emphysema ([3T]-[3T]).

## 2019-11-02 IMAGING — DX DG CHEST 1V PORT
1 series · 2 of 2 positions shown · non-contrast
Comparison: Chest radiograph dated [DATE].

CLINICAL DATA: 63-year-old male status post intubation.

EXAM:
PORTABLE CHEST 1 VIEW

[Series 1: chest · 0.14mm/px · 2 of 2 slices shown]
[im 1/2]
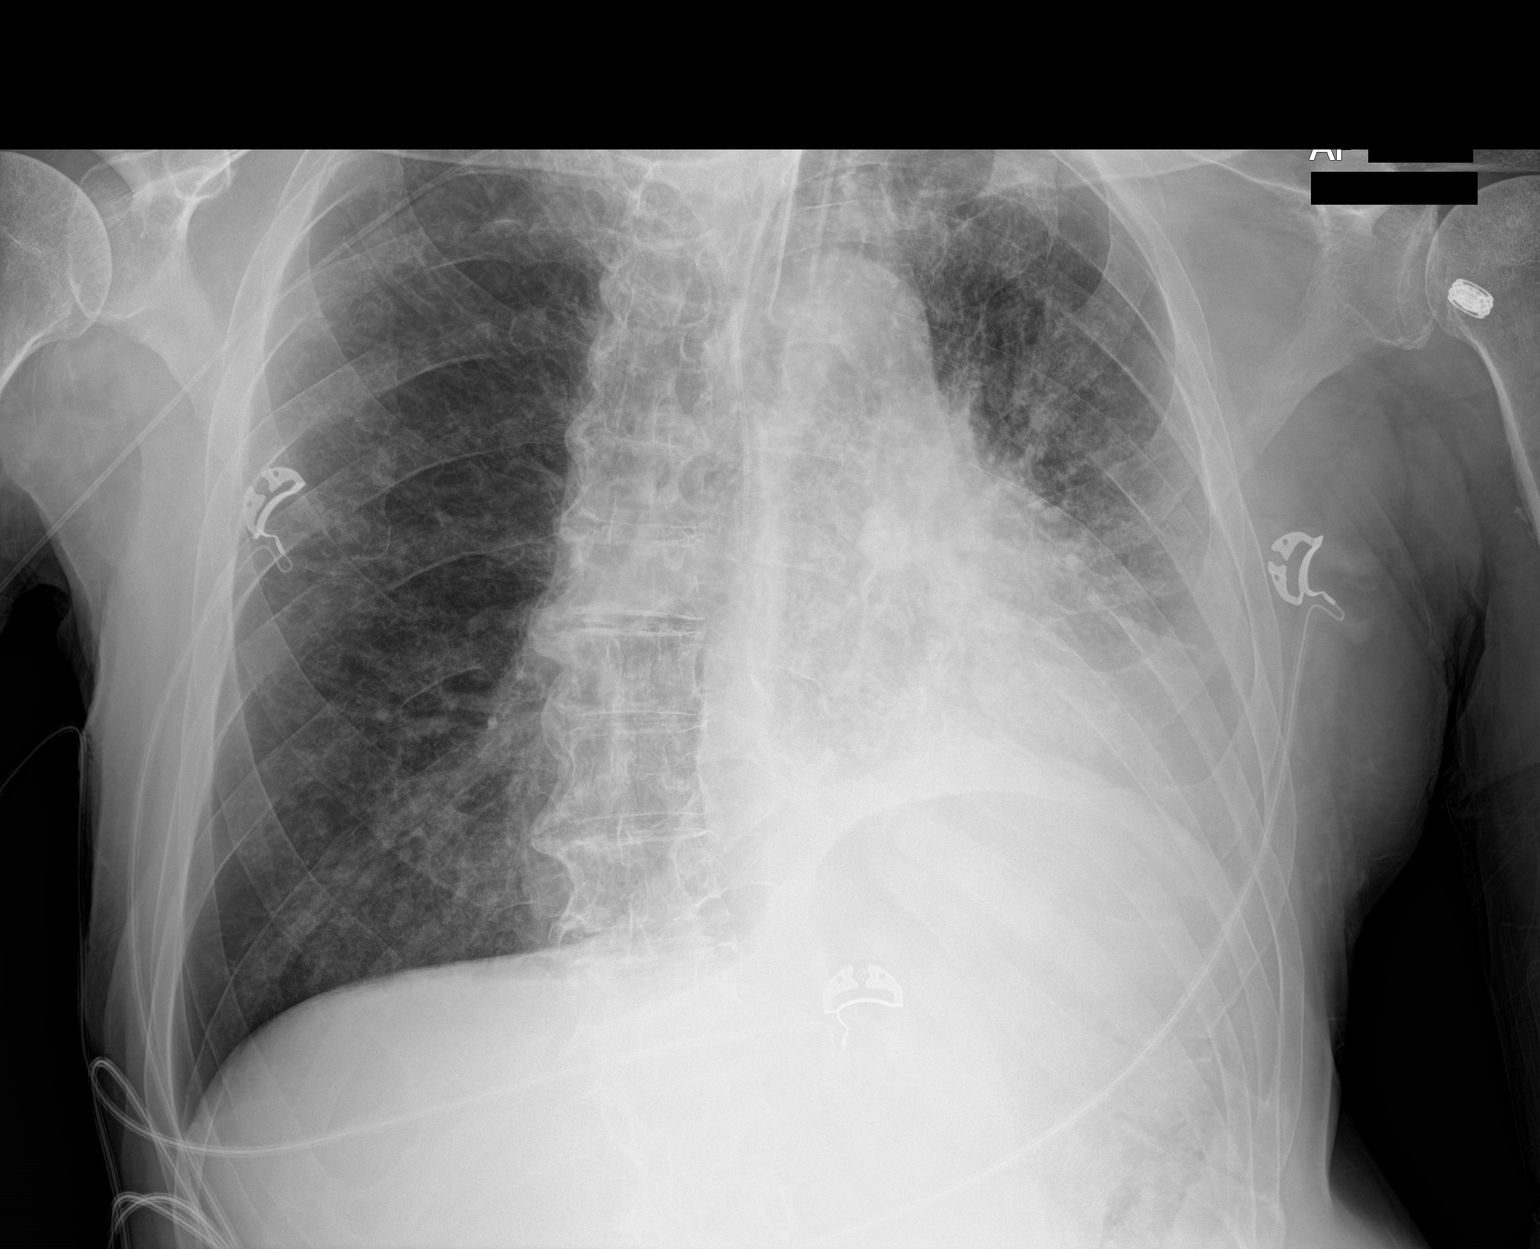
[im 2/2]
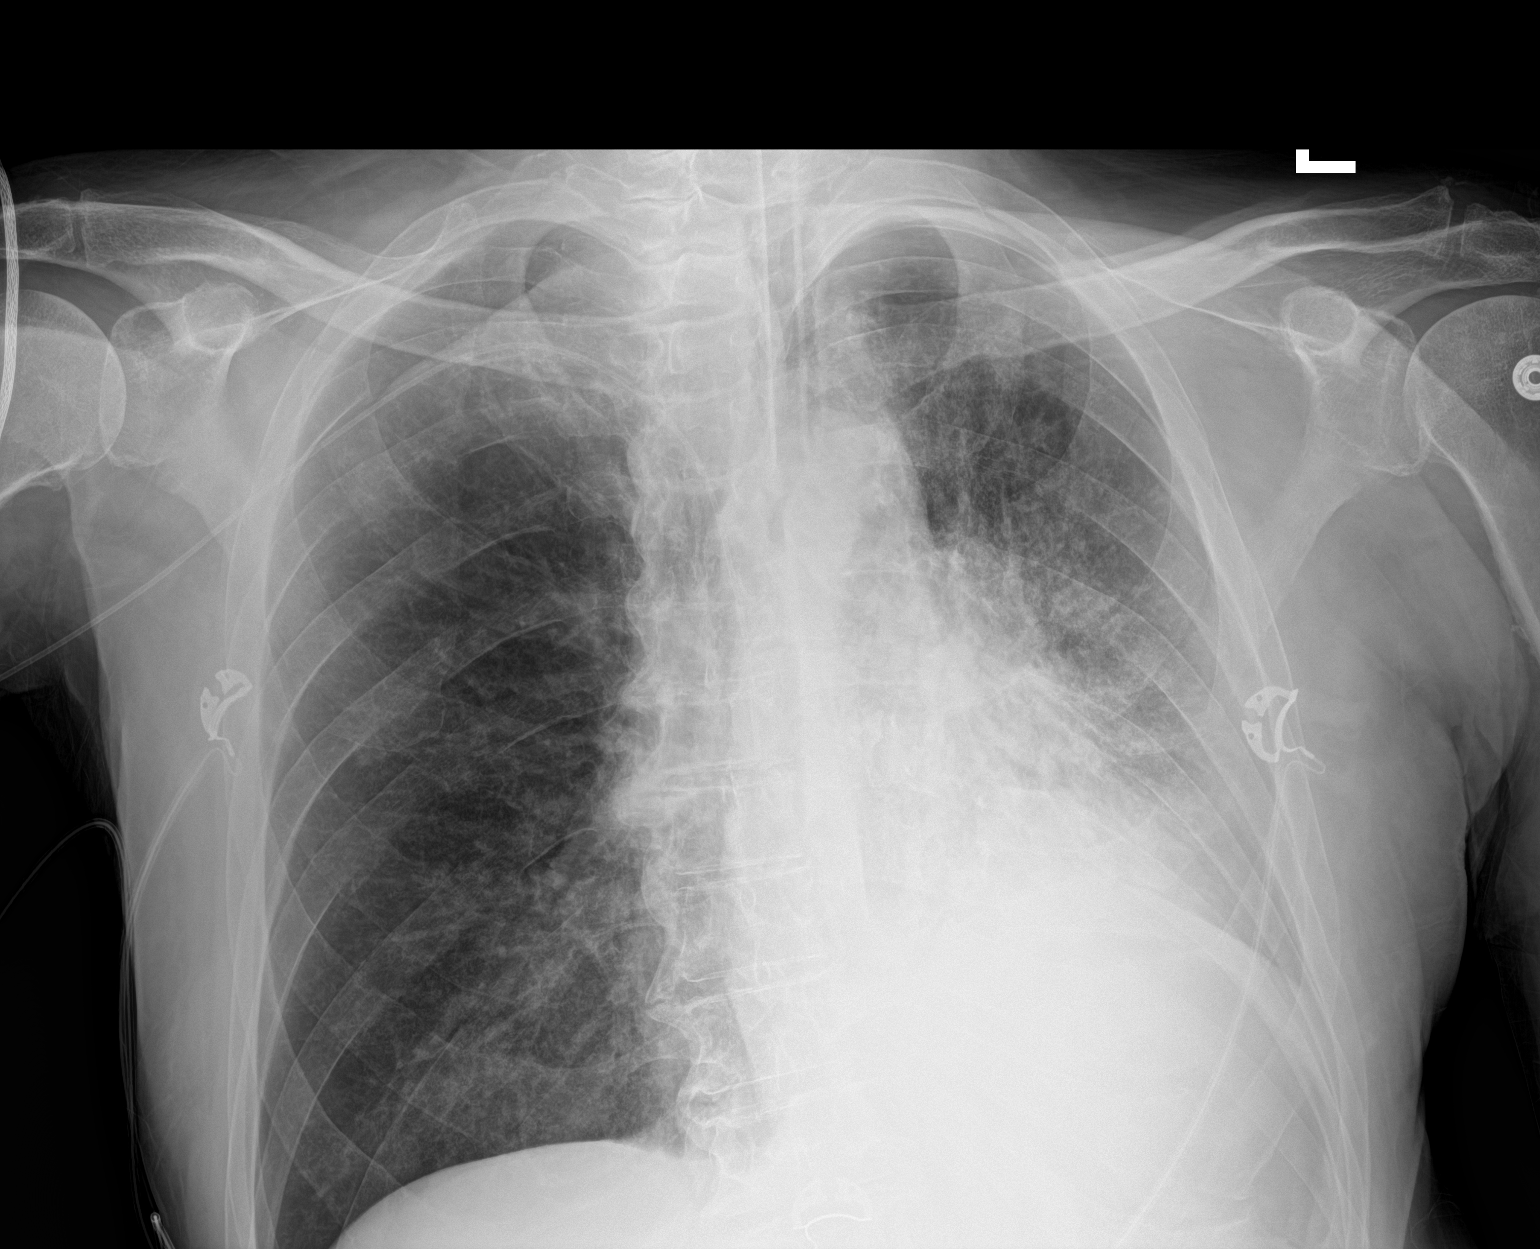

[2 of 2 positions shown; findings below may reference images not displayed]

FINDINGS: Interval placement of an endotracheal tube with tip approximately 3
cm above the carina. Right-sided PICC in similar position. The
patient is rotated. Left lung base density as seen previously.
Slight interval increase in the vascular markings of the left lung.
No pneumothorax. Degenerative changes of the spine. No acute osseous
pathology.
IMPRESSION: 1. Interval placement of an endotracheal tube with tip above the
carina.
2. Left lung base density similar to prior.

## 2019-11-02 IMAGING — DX DG CHEST 1V PORT
2 series · 2 of 2 positions shown · non-contrast
Comparison: [DATE] at [DATE] a.m.

CLINICAL DATA: Hypoxia

EXAM:
PORTABLE CHEST 1 VIEW

[chest ap (1 of 2)]
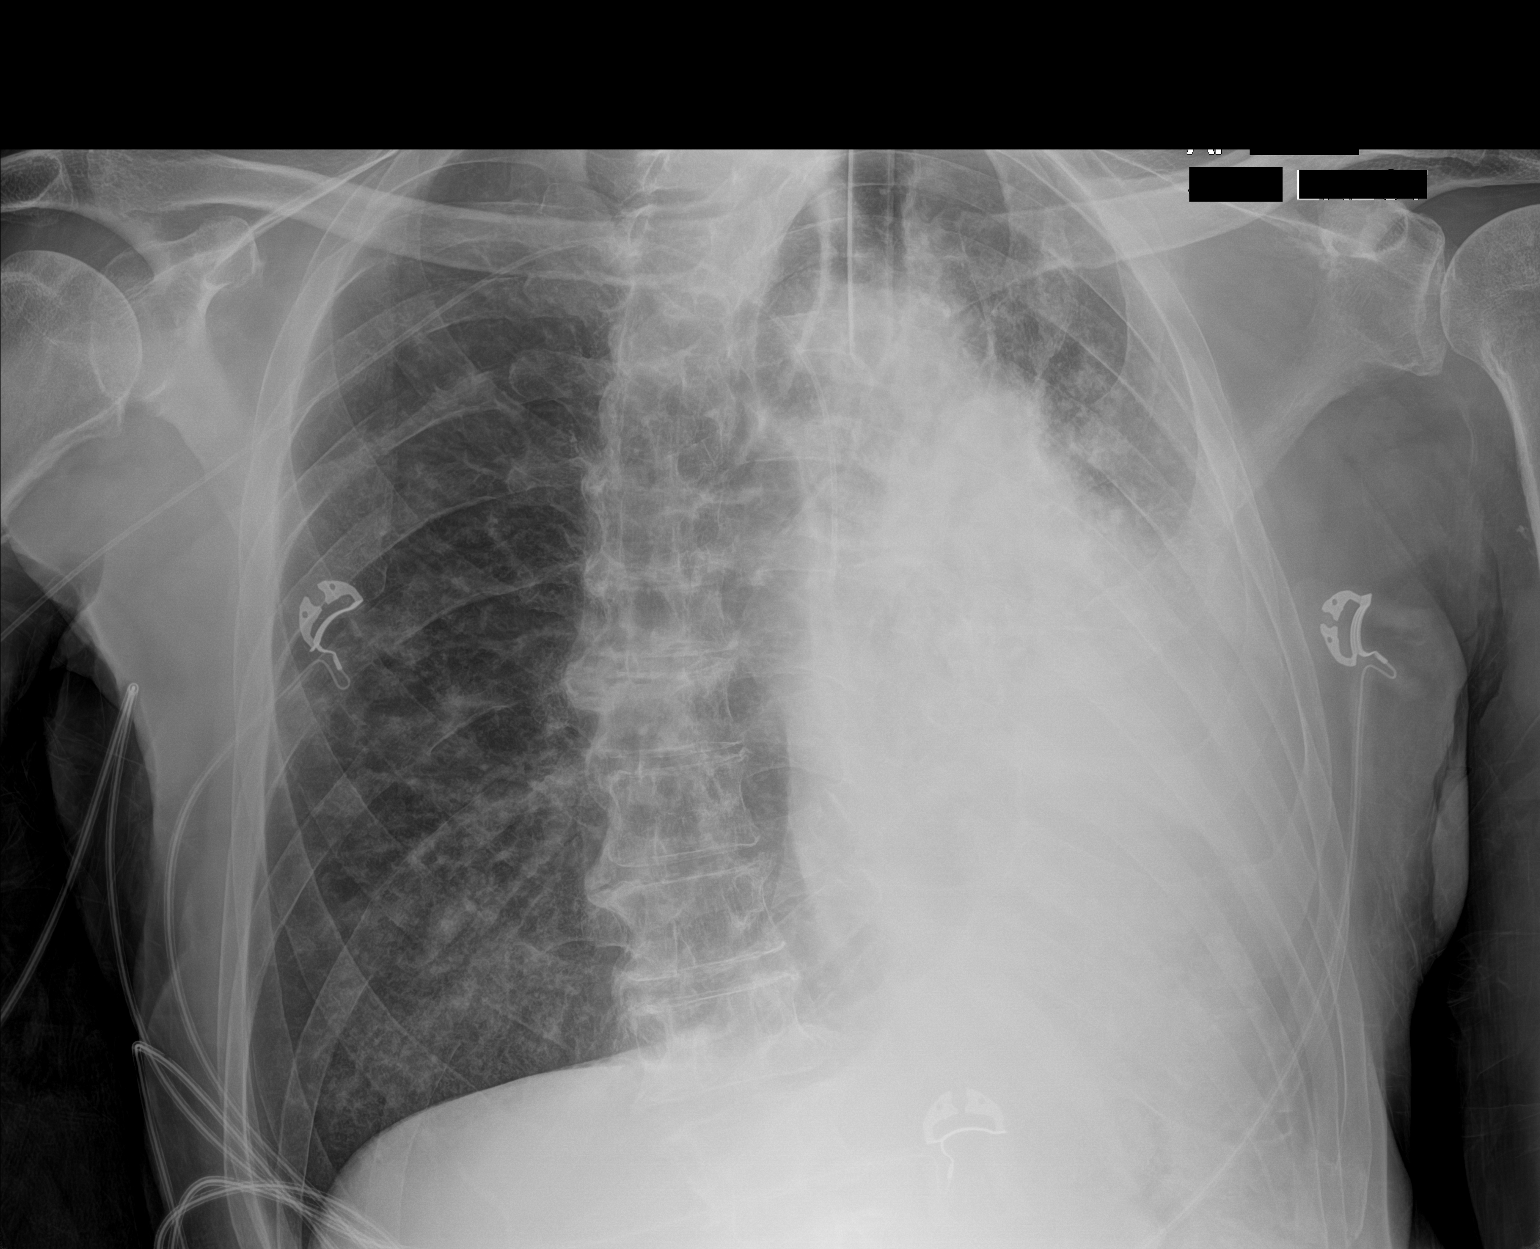

[chest ap (2 of 2)]
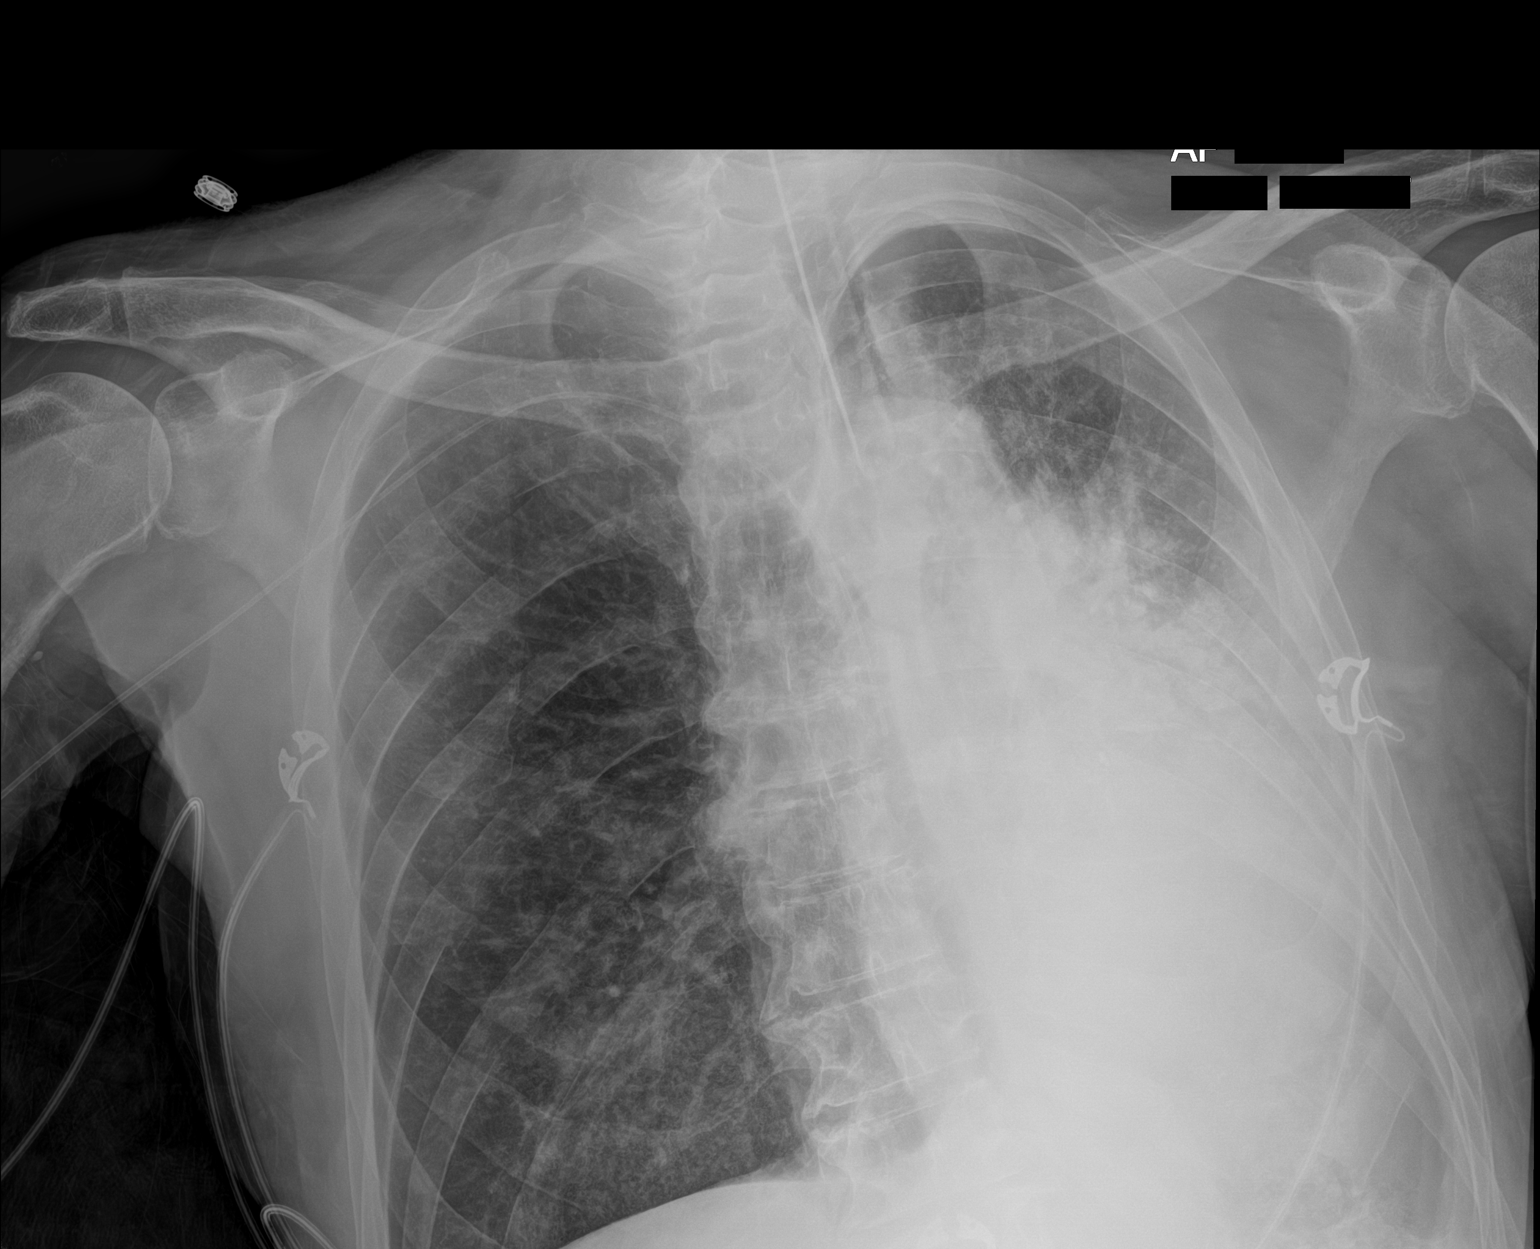

[2 of 2 positions shown; findings below may reference images not displayed]

FINDINGS: Markedly rotated to the left. Endotracheal tube tip is at the level
of the clavicular heads, slightly retracted from the prior study.
Tip of the right approach PICC line is in the lower SVC. Right lung
is clear. There is a intermediate sized left pleural effusion with
left basilar consolidation/atelectasis.
IMPRESSION: 1. Intermediate sized left pleural effusion with left basilar
consolidation/atelectasis.
2. Endotracheal tube tip at the level of the clavicular heads.

## 2019-11-02 MED ORDER — PROPOFOL 1000 MG/100ML IV EMUL
INTRAVENOUS | Status: AC
  Filled 2019-11-02: qty 100

## 2019-11-02 MED ORDER — ROCURONIUM BROMIDE 10 MG/ML (PF) SYRINGE
PREFILLED_SYRINGE | INTRAVENOUS | Status: AC
  Administered 2019-11-02: 60 mg
  Filled 2019-11-02: qty 10

## 2019-11-02 MED ORDER — SODIUM CHLORIDE 0.9 % IV SOLN: INTRAVENOUS | Status: DC

## 2019-11-02 MED ORDER — VECURONIUM BROMIDE 10 MG IV SOLR
INTRAVENOUS | Status: AC
  Filled 2019-11-02: qty 10

## 2019-11-02 MED ORDER — FENTANYL 2500MCG IN NS 250ML (10MCG/ML) PREMIX INFUSION
0.0000 ug/h | INTRAVENOUS | Status: DC
  Administered 2019-11-02: 50 ug/h via INTRAVENOUS
  Administered 2019-11-02 – 2019-11-03 (×3): 200 ug/h via INTRAVENOUS
  Administered 2019-11-04: 250 ug/h via INTRAVENOUS
  Filled 2019-11-02 (×5): qty 250

## 2019-11-02 MED ORDER — TRANEXAMIC ACID FOR INHALATION
500.0000 mg | Freq: Three times a day (TID) | RESPIRATORY_TRACT | Status: DC
  Administered 2019-11-02 – 2019-11-03 (×4): 500 mg via RESPIRATORY_TRACT
  Filled 2019-11-02 (×5): qty 10

## 2019-11-02 MED ORDER — VECURONIUM BROMIDE 10 MG IV SOLR
INTRAVENOUS | Status: AC
  Administered 2019-11-02: 10 mg
  Filled 2019-11-02: qty 10

## 2019-11-02 MED ORDER — SODIUM CHLORIDE 0.9% IV SOLUTION: Freq: Once | INTRAVENOUS | Status: AC

## 2019-11-02 MED ORDER — NOREPINEPHRINE 4 MG/250ML-% IV SOLN
0.0000 ug/min | INTRAVENOUS | Status: DC
  Administered 2019-11-02: 2 ug/min via INTRAVENOUS
  Administered 2019-11-02: 5 ug/min via INTRAVENOUS
  Administered 2019-11-03: 16 ug/min via INTRAVENOUS
  Administered 2019-11-03: 6 ug/min via INTRAVENOUS
  Administered 2019-11-04: 12 ug/min via INTRAVENOUS
  Filled 2019-11-02 (×3): qty 250

## 2019-11-02 MED ORDER — ORAL CARE MOUTH RINSE
15.0000 mL | OROMUCOSAL | Status: DC
  Administered 2019-11-02 – 2019-11-04 (×23): 15 mL via OROMUCOSAL

## 2019-11-02 MED ORDER — VANCOMYCIN HCL IN DEXTROSE 1-5 GM/200ML-% IV SOLN
1000.0000 mg | Freq: Two times a day (BID) | INTRAVENOUS | Status: DC
  Administered 2019-11-02: 1000 mg via INTRAVENOUS
  Filled 2019-11-02: qty 200

## 2019-11-02 MED ORDER — ROCURONIUM BROMIDE 50 MG/5ML IV SOLN: 60.0000 mg | Freq: Once | INTRAVENOUS | Status: AC

## 2019-11-02 MED ORDER — PANTOPRAZOLE SODIUM 40 MG IV SOLR
40.0000 mg | Freq: Two times a day (BID) | INTRAVENOUS | Status: DC
  Administered 2019-11-02 – 2019-11-04 (×6): 40 mg via INTRAVENOUS
  Filled 2019-11-02 (×7): qty 40

## 2019-11-02 MED ORDER — ROCURONIUM BROMIDE 50 MG/5ML IV SOLN: 10.0000 mg | Freq: Once | INTRAVENOUS | Status: AC

## 2019-11-02 MED ORDER — PROPOFOL 1000 MG/100ML IV EMUL
5.0000 ug/kg/min | INTRAVENOUS | Status: DC
  Administered 2019-11-02 (×2): 30 ug/kg/min via INTRAVENOUS
  Administered 2019-11-02: 5 ug/kg/min via INTRAVENOUS
  Administered 2019-11-03 (×2): 30 ug/kg/min via INTRAVENOUS
  Administered 2019-11-03 – 2019-11-04 (×3): 40 ug/kg/min via INTRAVENOUS
  Administered 2019-11-04: 30 ug/kg/min via INTRAVENOUS
  Filled 2019-11-02 (×8): qty 100

## 2019-11-02 MED ORDER — VECURONIUM BROMIDE 10 MG IV SOLR
10.0000 mg | Freq: Once | INTRAVENOUS | Status: AC
  Administered 2019-11-02: 10 mg via INTRAVENOUS

## 2019-11-02 MED ORDER — IOHEXOL 350 MG/ML SOLN
100.0000 mL | Freq: Once | INTRAVENOUS | Status: AC | PRN
  Administered 2019-11-02: 100 mL via INTRAVENOUS

## 2019-11-02 MED ORDER — SODIUM CHLORIDE 0.9 % IV SOLN
2.0000 g | Freq: Three times a day (TID) | INTRAVENOUS | Status: DC
  Administered 2019-11-02 – 2019-11-05 (×9): 2 g via INTRAVENOUS
  Filled 2019-11-02 (×9): qty 2

## 2019-11-02 MED ORDER — FENTANYL BOLUS VIA INFUSION
75.0000 ug | Freq: Once | INTRAVENOUS | Status: AC
  Administered 2019-11-02: 75 ug via INTRAVENOUS
  Filled 2019-11-02: qty 75

## 2019-11-02 MED ORDER — CHLORHEXIDINE GLUCONATE 0.12% ORAL RINSE (MEDLINE KIT)
15.0000 mL | Freq: Two times a day (BID) | OROMUCOSAL | Status: DC
  Administered 2019-11-02 – 2019-11-04 (×5): 15 mL via OROMUCOSAL

## 2019-11-02 NOTE — Progress Notes (Signed)
Pt transferred to and from CT scan with stable VS.

## 2019-11-02 NOTE — Progress Notes (Signed)
Patient ID: Drew Nguyen, male   DOB: 10-10-1955, 64 y.o.   MRN: [id number]  Interventional Radiology Note   reviewed Chest CTA.  No enlarged bronchial artery by CTA to target for this central lesion.  No pulm artery source visualized either.  No active bleeding by CTA.  Thoracic aortogram/bronchial artery angio is likely low yield.  It's not recommended to do empiric embolization because of the risk of nontarget embo of the anterior spinal artery.  D/w PCCM.

## 2019-11-02 NOTE — Progress Notes (Signed)
Pt desaturating to SpO2 76% on 60% FiO2, labored respirations, FiO2 increased to 100%, ABG collection per order.

## 2019-11-02 NOTE — Progress Notes (Signed)
Images from Bronchoscopy:

## 2019-11-02 NOTE — Progress Notes (Addendum)
[facility] Progress Note Patient Name: Drew Nguyen DOB: 1956/05/09 MRN: [id number]   Date of Service  11/02/2019  HPI/Events of Note  CT Angio chest report seen and film reviewed myself. No obvious bleeding seen. No PE. Left lower upper atelectasis, mass encasing left pulmonary artery area.   Camera: After coming from CT: sats 100%, SBP 130. In synchrony with vent .   eICU Interventions  Continue care. Consider GI source? Unlikely.  Consider Bronch once fio2 under 60 or so.      Intervention Category Intermediate Interventions: Diagnostic test evaluation  Elmer Sow 11/02/2019, 5:19 AM   5:53 S/p Bronchoscopy done, report seen. Dr Deatra Canter trying him to go for IR intervention then Pulmonary tioletting.

## 2019-11-02 NOTE — Progress Notes (Signed)
Called Elink MD about patient having increased Peak Pressures and Decreased exhaled volumes. ABG will not read on ISTAT other than pH of 7.123/CO2 >97.0/pao2 of 78. Sent specimen down to lab to receive other values in system. MD gave a few vent changes but stated until bleeding under control, hard to ventilate. RN aware.

## 2019-11-02 NOTE — Progress Notes (Signed)
NAME:  Drew Nguyen, MRN:  [id number], DOB:  1955/11/04, LOS: 43 ADMISSION DATE:  10/15/2019, CONSULTATION DATE:  10/17/19 REFERRING MD:  Rutherford, CHIEF COMPLAINT:  Lung mass    Brief History   64 yo man admitted with encephalopathy found to have left upper lobe lung mass.  Initial PCCM consult was 2/26 for lung mass.  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  Encephalopathy - suspect paraneoplastic process from [place] but LP was never done.  PCCM + after diagnosis.  Has been getting inpatient chemotherapy and radiation at [facility]. Had massive hemoptysis on the floor at [facility] and transferred back to [facility] hospital 3/13 evening.  PCCM reconsulted for hemoptysis respiratory failure.  Consults:  CCM, Oncology  Procedures:  Bronchoscopy with EBUS 3/2 Intubation 3/13 Bronchoscopy 3/13 Bronchoscopy 3/14  Significant Diagnostic Tests:  CT angio chest 3/14 The debris within the trachea and mainstem bronchi corresponds to hemorrhage. Some of the patchy opacities in the lungs bilaterally may reflect hemorrhage rather than infection, particularly in the superior segment left lower lobe (for example, series 8/image 31). However, when closely evaluating this region, it still appears to have a pulmonary arterial/venous supply. An aberrant bronchial artery source is not definitely identified.  Prior study also noted narrowing of a left upper lobe pulmonary artery anteriorly. This is improved on the current study, presumably following chemotherapy.   Micro Data:  3/13 MRSA PCR negative 3/13 Covid negative  Antimicrobials:  Started on empiric HCAP coverage vancomycin and cefepime on 3/14  Interim history/subjective:    Overnight patient had required intubation with 8 oh endotracheal tube.  Emergent bedside bronchoscopy showed substantial clot in the trachea which was unable to be intubated past.  This morning repeat examination by Dr. Tamala Julian  demonstrated the same.  Discussed the case with IR this morning, CT angio without active extravasation.  They do not feel that Eric embolization will be safe or clinically helpful.  Suspect that there must be endobronchial bleeding from his left upper lobe mass.  Objective   Blood pressure 126/75, pulse 70, temperature (!) 97.2 F (36.2 C), temperature source Axillary, resp. rate 20, height 5\' 6"  (1.676 m), weight 62 kg, SpO2 94 %.    Vent Mode: PRVC FiO2 (%):  [50 %-100 %] 90 % Set Rate:  [16 bmp-20 bmp] 20 bmp Vt Set:  [510 mL] 510 mL PEEP:  [5 cmH20-14 cmH20] 14 cmH20 Plateau Pressure:  [28 cmH20-32 cmH20] 32 cmH20   Intake/Output Summary (Last 24 hours) at 11/02/2019 0945 Last data filed at 11/02/2019 0900 Gross per 24 hour  Intake 2103.04 ml  Output 1055 ml  Net 1048.04 ml   Filed Weights   10/30/19 0700 11/01/19 1941 11/02/19 0000  Weight: 68.5 kg 62.5 kg 62 kg    Examination: General: Intubated, sedated HENT: Endotracheal tube to vent, blood in oropharynx Lungs: Diminished breath sounds on the left Cardiovascular: Regular rate and rhythm Abdomen: Soft, nontender Extremities: No edema Neuro: Sedated, nonresponsive MSK: No rashes Lines: PICC line   Assessment & Plan:   Acute hypoxemic respiratory failure Metastatic small cell lung cancer, new diagnosis, currently on inpatient chemotherapy and radiation Massive hemoptysis -Suspect that he is bleeding from endobronchial lesions.  Case discussed with Dr. Tamala Julian.  Plan is to maintain mechanical ventilation and do bronchoscopy with cryotherapy in an attempt to suction the clot in his trachea on Monday 3/15 in the OR.  Continue TXA nebulizers.  Holding Plavix and aspirin. -  Started on empiric HCAP coverage.  MRSA PCR negative.  Vancomycin.  Continue cefepime for now  Acute metabolic encephalopathy History of CVA on Plavix Has been getting lactulose for elevated ammonia No diagnosis of cirrhosis Would consider  paraneoplastic syndrome as I suggested earlier this admission.  Likely secondary to small cell lung cancer.  Rheumatoid arthritis On prednisone 50 mg.  Holding Plaquenil and Enbrel   COPD with ongoing tobacco use disorder No evidence of exacerbation  Best practice:  Diet: N.p.o., will probably need an OG or NG tube start tube feeds Pain/Anxiety/Delirium protocol (if indicated): Fentanyl, propofol VAP protocol (if indicated): Ordered DVT prophylaxis: SCDs in the setting of acute hemoptysis GI prophylaxis: PPI Glucose control: Controlled goal less than 180 Foley placed Mobility: Bedrest Code Status: Full code Family Communication: Brother [name] updated by phone Disposition: Needs ICU  Labs   CBC: Recent Labs  Lab 10/31/19 0500 10/31/19 0500 11/01/19 0938 11/01/19 1907 11/01/19 2347 11/02/19 0508 11/02/19 0531  WBC 9.1  --  9.4 12.8* 16.2*  --  20.2*  NEUTROABS  --   --   --  10.4*  --   --   --   HGB 9.7*   < > 10.3* 9.5* 8.7* 6.8* 7.2*  HCT 28.9*   < > 30.9* 28.3* 26.5* 20.0* 22.4*  MCV 96.7  --  94.5 96.6 96.0  --  97.0  PLT 126*  --  147* 158 172  --  189   < > = values in this interval not displayed.    Basic Metabolic Panel: Recent Labs  Lab 10/28/19 0608 10/28/19 6237 10/29/19 0530 10/29/19 0530 10/30/19 0443 10/31/19 0500 11/01/19 0938 11/01/19 2347 11/02/19 0508  NA 140   < > 139   < > 140 137 138 138 139  K 3.2*   < > 3.0*   < > 2.7* 3.7 2.6* 3.7 3.8  CL 103   < > 103  --  103 102 100 101  --   CO2 26   < > 25  --  28 27 28 28   --   GLUCOSE 160*   < > 164*  --  186* 203* 253* 195*  --   BUN 26*   < > 26*  --  23 21 18  25*  --   CREATININE 0.66   < > 0.71  --  0.56* 0.60* 0.52* 0.64  --   CALCIUM 8.0*   < > 7.9*  --  8.2* 8.0* 7.6* 7.4*  --   MG 2.0  --  1.9  --   --  1.8 1.8 2.2  --   PHOS  --   --   --   --   --  2.8 2.8  --   --    < > = values in this interval not displayed.   GFR: Estimated Creatinine Clearance: 81.8 mL/min (by C-G formula  based on SCr of 0.64 mg/dL). Recent Labs  Lab 11/01/19 0938 11/01/19 1907 11/01/19 2347 11/02/19 0531  WBC 9.4 12.8* 16.2* 20.2*    Liver Function Tests: Recent Labs  Lab 10/28/19 0608 10/29/19 0530 10/30/19 0443 10/31/19 0500 11/01/19 2347  AST 155* 144* 141* 101* 73*  ALT 349* 311* 338* 266* 194*  ALKPHOS 413* 412* 456* 337* 314*  BILITOT 1.2 1.3* 1.4* 0.8 1.2  PROT 5.8* 5.4* 5.7* 5.1* 4.7*  ALBUMIN 3.1* 2.7* 2.8* 2.3* 2.1*   No results for input(s): LIPASE, AMYLASE in the last 168 hours. No results  for input(s): AMMONIA in the last 168 hours.  ABG    Component Value Date/Time   PHART 7.410 11/02/2019 0508   PCO2ART 46.2 11/02/2019 0508   PO2ART 68.0 (L) 11/02/2019 0508   HCO3 29.3 (H) 11/02/2019 0508   TCO2 31 11/02/2019 0508   O2SAT 93.0 11/02/2019 0508     Coagulation Profile: No results for input(s): INR, PROTIME in the last 168 hours.  Cardiac Enzymes: No results for input(s): CKTOTAL, CKMB, CKMBINDEX, TROPONINI in the last 168 hours.  HbA1C: Hgb A1c MFr Bld  Date/Time Value Ref Range Status  06/06/2019 03:27 AM 5.4 4.8 - 5.6 % Final    Comment:    (NOTE) Pre diabetes:          5.7%-6.4% Diabetes:              >6.4% Glycemic control for   <7.0% adults with diabetes   04/20/2019 05:32 AM 5.1 4.8 - 5.6 % Final    Comment:    (NOTE) Pre diabetes:          5.7%-6.4% Diabetes:              >6.4% Glycemic control for   <7.0% adults with diabetes     CBG: Recent Labs  Lab 11/02/19 0710  GLUCAP 212*    Critical care time:   The patient is critically ill with multiple organ systems failure and requires high complexity decision making for assessment and support, frequent evaluation and titration of therapies, application of advanced monitoring technologies and extensive interpretation of multiple databases.   Critical Care Time devoted to patient care services described in this note is 47 minutes. This time reflects the time of my personal  involvement. This critical care time does not reflect separately billable procedures or procedure time, teaching time or supervisory time of PA/NP/Med student/Med Resident etc but could involve care discussion time.  Leone [facility] 11/02/2019 9:45 AM  Pager: [phone] After hours pager: [phone]

## 2019-11-02 NOTE — Progress Notes (Signed)
[facility] Progress Note Patient Name: Drew Nguyen DOB: [birth year] MRN: [id number]   Date of Service  11/02/2019  HPI/Events of Note  ABG : Type 2 resp failure. As per RT and camera: P peak high.  COPD. co2 high. sats 90% on monitor.   eICU Interventions  Going for CTA. Vt < 8 Increase PEEP 8 and Rate 20. Watch for auro PEEP.  Until hemoptysis under control, difficult to adjust vent setting.      Intervention Category Major Interventions: Acid-Base disturbance - evaluation and management Intermediate Interventions: Diagnostic test evaluation  Elmer Sow 11/02/2019, 1:32 AM

## 2019-11-02 NOTE — Progress Notes (Addendum)
eLink Physician-Brief Progress Note Patient Name: Drew Nguyen DOB: November 09, 1955 MRN: [id number]   Date of Service  11/02/2019  HPI/Events of Note  New admit: 64 yr old transferred from [facility] for Massive hemoptysis. SCLC/mets to liver, recent chemo/Rt on 11 th. Sz . COPD.   Camera: Intubated in ICU by Dr Deatra Canter. He is in room. Hypotensive post.   Getting CT chest aorta for bleeding site. Then BAE by IR.  eICU Interventions  - follow CxR. On Vent.  - no coagulopathy. Hg down to 8.7 from earlier > 9. - SCLC, hepatic mets.  - high risk for decompensation to massive bleeding and circulatory collapse. SCLC could be involving left pulmonary artery could be the source of bleeding.  - keep MAP > 65.  - Vent bundle.  - hold palvix. Hold BP meds. Nebs/steroids for COPD. - Tranxamic acid nebs .   No intervention at this time from eICU side.      Intervention Category Intermediate Interventions: Respiratory distress - evaluation and management  Elmer Sow 11/02/2019, 12:20 AM   01:09 Follow up: routine.  Camera: On 510/5/16/100%. Sats < 90. SBP 118. On propofol/fent sedation. CxR: ET in place, stable left low lung volume, atelectasis, left perihilar fullness.  - get ABG.

## 2019-11-02 NOTE — Progress Notes (Signed)
Drew Nguyen   DOB:1956-05-02   XT#:[id number]   GDJ#:[id number]  Oncology follow up   Subjective: Patient was transferred to [facility] due to severe hemoptysis.  He was intubated.  Bronchoscopy was performed at the bedside this morning.  Blood clots seen in the distal trachea and bilateral mainstem bronchi, intervention was not successful.  Patient is hemodynamically stable.    Objective:  Vitals:   11/02/19 1445 11/02/19 1500  BP: 110/70 112/70  Pulse: 71 69  Resp: 20 20  Temp:    SpO2: 99% 100%    Body mass index is 22.06 kg/m.  Intake/Output Summary (Last 24 hours) at 11/02/2019 1503 Last data filed at 11/02/2019 1500 Gross per 24 hour  Intake 2567.69 ml  Output 1600 ml  Net 967.69 ml    Pt intubated, sedated   Sclerae unicteric  Lungs clear -- no rales or rhonchi  Heart regular rate and rhythm  Abdomen benign  MSK no focal spinal tenderness, no peripheral edema   CBG (last 3)  Recent Labs    11/02/19 0710 11/02/19 1111  GLUCAP 212* 170*     Labs:  Urine Studies No results for input(s): UHGB, CRYS in the last 72 hours.  Invalid input(s): UACOL, UAPR, USPG, UPH, UTP, UGL, UKET, UBIL, UNIT, UROB, ULEU, UEPI, UWBC, URBC, UBAC, CAST, [place], [place]  Basic Metabolic Panel: Recent Labs  Lab 10/28/19 [id number] 10/28/19 9798 10/29/19 0530 10/29/19 0530 10/30/19 0443 10/30/19 0443 10/31/19 0500 10/31/19 0500 11/01/19 0938 11/01/19 0938 11/01/19 2347 11/02/19 0508  NA 140   < > 139   < > 140  --  137  --  138  --  138 139  K 3.2*   < > 3.0*   < > 2.7*   < > 3.7   < > 2.6*   < > 3.7 3.8  CL 103   < > 103  --  103  --  102  --  100  --  101  --   CO2 26   < > 25  --  28  --  27  --  28  --  28  --   GLUCOSE 160*   < > 164*  --  186*  --  203*  --  253*  --  195*  --   BUN 26*   < > 26*  --  23  --  21  --  18  --  25*  --   CREATININE 0.66   < > 0.71  --  0.56*  --  0.60*  --  0.52*  --  0.64  --   CALCIUM 8.0*   < > 7.9*  --  8.2*  --  8.0*  --  7.6*  --  7.4*  --    MG 2.0  --  1.9  --   --   --  1.8  --  1.8  --  2.2  --   PHOS  --   --   --   --   --   --  2.8  --  2.8  --   --   --    < > = values in this interval not displayed.   GFR Estimated Creatinine Clearance: 81.8 mL/min (by C-G formula based on SCr of 0.64 mg/dL). Liver Function Tests: Recent Labs  Lab 10/28/19 0608 10/29/19 0530 10/30/19 0443 10/31/19 0500 11/01/19 2347  AST 155* 144* 141* 101* 73*  ALT 349* 311* 338* 266*  194*  ALKPHOS 413* 412* 456* 337* 314*  BILITOT 1.2 1.3* 1.4* 0.8 1.2  PROT 5.8* 5.4* 5.7* 5.1* 4.7*  ALBUMIN 3.1* 2.7* 2.8* 2.3* 2.1*   No results for input(s): LIPASE, AMYLASE in the last 168 hours. No results for input(s): AMMONIA in the last 168 hours. Coagulation profile No results for input(s): INR, PROTIME in the last 168 hours.  CBC: Recent Labs  Lab 11/01/19 0938 11/01/19 0938 11/01/19 1907 11/01/19 2347 11/02/19 0508 11/02/19 0531 11/02/19 1058  WBC 9.4  --  12.8* 16.2*  --  20.2* 10.0  NEUTROABS  --   --  10.4*  --   --   --   --   HGB 10.3*   < > 9.5* 8.7* 6.8* 7.2* 8.3*  HCT 30.9*   < > 28.3* 26.5* 20.0* 22.4* 25.1*  MCV 94.5  --  96.6 96.0  --  97.0 95.1  PLT 147*  --  158 172  --  189 129*   < > = values in this interval not displayed.   Cardiac Enzymes: No results for input(s): CKTOTAL, CKMB, CKMBINDEX, TROPONINI in the last 168 hours. BNP: Invalid input(s): POCBNP CBG: Recent Labs  Lab 11/02/19 0710 11/02/19 1111  GLUCAP 212* 170*   D-Dimer No results for input(s): DDIMER in the last 72 hours. Hgb A1c No results for input(s): HGBA1C in the last 72 hours. Lipid Profile Recent Labs    11/02/19 0531  TRIG 148   Thyroid function studies No results for input(s): TSH, T4TOTAL, T3FREE, THYROIDAB in the last 72 hours.  Invalid input(s): FREET3 Anemia work up No results for input(s): VITAMINB12, FOLATE, FERRITIN, TIBC, IRON, RETICCTPCT in the last 72 hours. Microbiology Recent Results (from the past 240 hour(s))   SARS CORONAVIRUS 2 (TAT 6-24 HRS) Nasopharyngeal Nasopharyngeal Swab     Status: None   Collection Time: 10/31/19  4:28 PM   Specimen: Nasopharyngeal Swab  Result Value Ref Range Status   SARS Coronavirus 2 NEGATIVE NEGATIVE Final    Comment: (NOTE) SARS-CoV-2 target nucleic acids are NOT DETECTED. The SARS-CoV-2 RNA is generally detectable in upper and lower respiratory specimens during the acute phase of infection. Negative results do not preclude SARS-CoV-2 infection, do not rule out co-infections with other pathogens, and should not be used as the sole basis for treatment or other patient management decisions. Negative results must be combined with clinical observations, patient history, and epidemiological information. The expected result is Negative. Fact Sheet for Patients: SugarRoll.be Fact Sheet for Healthcare Providers: https://www.woods-mathews.com/ This test is not yet approved or cleared by the [place] FDA and  has been authorized for detection and/or diagnosis of SARS-CoV-2 by FDA under an Emergency Use Authorization (EUA). This EUA will remain  in effect (meaning this test can be used) for the duration of the COVID-19 declaration under Section 56 4(b)(1) of the Act, 21 U.S.C. section 360bbb-3(b)(1), unless the authorization is terminated or revoked sooner. Performed at [facility], [place] [address], [name], [place] [address]   MRSA PCR Screening     Status: None   Collection Time: 11/01/19  7:52 PM   Specimen: Nasal Mucosa; Nasopharyngeal  Result Value Ref Range Status   MRSA by PCR NEGATIVE NEGATIVE Final    Comment:        The GeneXpert MRSA Assay (FDA approved for NASAL specimens only), is one component of a comprehensive MRSA colonization surveillance program. It is not intended to diagnose MRSA infection nor to guide or monitor  treatment for MRSA infections. Performed at [facility], [place] [address], [place], [place] [address]       Studies:  DG Chest Port 1 View  Result Date: 11/02/2019 CLINICAL DATA:  Hypoxia EXAM: PORTABLE CHEST 1 VIEW COMPARISON:  11/02/2019 at 12:27 a.m. FINDINGS: Markedly rotated to the left. Endotracheal tube tip is at the level of the clavicular heads, slightly retracted from the prior study. Tip of the right approach PICC line is in the lower SVC. Right lung is clear. There is a intermediate sized left pleural effusion with left basilar consolidation/atelectasis. IMPRESSION: 1. Intermediate sized left pleural effusion with left basilar consolidation/atelectasis. 2. Endotracheal tube tip at the level of the clavicular heads. Electronically Signed   By: Ulyses Jarred M.D.   On: 11/02/2019 03:48   DG CHEST PORT 1 VIEW  Result Date: 11/02/2019 CLINICAL DATA:  64 year old male status post intubation. EXAM: PORTABLE CHEST 1 VIEW COMPARISON:  Chest radiograph dated 11/01/2019. FINDINGS: Interval placement of an endotracheal tube with tip approximately 3 cm above the carina. Right-sided PICC in similar position. The patient is rotated. Left lung base density as seen previously. Slight interval increase in the vascular markings of the left lung. No pneumothorax. Degenerative changes of the spine. No acute osseous pathology. IMPRESSION: 1. Interval placement of an endotracheal tube with tip above the carina. 2. Left lung base density similar to prior. Electronically Signed   By: Anner Crete M.D.   On: 11/02/2019 00:50   DG Chest Port 1 View  Result Date: 11/01/2019 CLINICAL DATA:  Hemoptysis. EXAM: PORTABLE CHEST 1 VIEW COMPARISON:  October 25, 2019 FINDINGS: Mild-to-moderate severity atelectasis and/or infiltrate is seen within the left lung base. This is decreased in severity when compared to the prior study. There is no evidence of a pleural effusion or pneumothorax. The heart size and mediastinal contours are within normal limits. Mild-to-moderate  severity multilevel degenerative changes seen throughout the thoracic spine. IMPRESSION: Mild-to-moderate severity left basilar atelectasis and/or infiltrate. Electronically Signed   By: Virgina Norfolk M.D.   On: 11/01/2019 19:42   CT ANGIO CHEST AORTA W/CM & OR WO/CM  Addendum Date: 11/02/2019   ADDENDUM REPORT: 11/02/2019 05:21 ADDENDUM: Case discussed with Dr. Deatra Canter at 5:15. Patient has active massive hemoptysis. The debris within the trachea and mainstem bronchi corresponds to hemorrhage. Some of the patchy opacities in the lungs bilaterally may reflect hemorrhage rather than infection, particularly in the superior segment left lower lobe (for example, series 8/image 31). However, when closely evaluating this region, it still appears to have a pulmonary arterial/venous supply. An aberrant bronchial artery source is not definitely identified. Prior study also noted narrowing of a left upper lobe pulmonary artery anteriorly. This is improved on the current study, presumably following chemotherapy. Electronically Signed   By: Julian Hy M.D.   On: 11/02/2019 05:21   Result Date: 11/02/2019 CLINICAL DATA:  Hemoptysis EXAM: CT ANGIOGRAPHY CHEST WITH CONTRAST TECHNIQUE: Multidetector CT imaging of the chest was performed using the standard protocol during bolus administration of intravenous contrast. Multiplanar CT image reconstructions and MIPs were obtained to evaluate the vascular anatomy. CONTRAST:  111mL OMNIPAQUE IOHEXOL 350 MG/ML SOLN COMPARISON:  Chest radiograph dated 11/02/2019. CT chest dated 10/17/2019. FINDINGS: Cardiovascular: Satisfactory opacification the pulmonary arteries to the segmental level. No evidence of pulmonary embolism. Although not tailored for evaluation of the thoracic aorta, there is no evidence of thoracic aortic aneurysm or dissection. Mild atherosclerotic calcifications of the aortic arch. The heart is  top-normal in size. Small pericardial effusion inferiorly. Leftward  cardiomediastinal shift. Mediastinum/Nodes: 1.9 cm short axis AP window node, previously 2.5 cm. Additional tumor in the left hilar region, indistinguishable from the patient's known left upper lobe mass (described below). Lungs/Pleura: Endotracheal tube terminates 2 cm above the carina. Debris in the distal trachea (series 8/image 46) extending into the right mainstem bronchus (series 8/image 58) as well as the left mainstem bronchus and left lower lobe airway (series 8/image 56). Central left upper lobe mass extending to the left hilar region, difficult to discretely measure, but likely at least 2.5 x 4.0 cm (series 6/image 58). This may have measures 3.4 x 5.5 cm on the prior CT. Surrounding postobstructive opacity/atelectasis in the left upper lobe. Left lower lobe atelectasis/collapse, with additional patchy/nodular opacities in the left upper and lower lobes, suggesting pneumonia. Additional patchy opacity in the posterior right lower lobe (series 8/image 112), suggesting atelectasis, however there are patchy/nodular opacities throughout the right lung which suggests multifocal pneumonia. Mild centrilobular and paraseptal emphysematous changes. Volume loss in the left hemithorax. Small left pleural effusion. No pneumothorax. Upper Abdomen: Mild thickening of the left adrenal gland without discrete mass. Although poorly visualized, there are innumerable hepatic metastases, measuring up to 2.5 cm in the central liver (series 6/image 146). These are better evaluated on recent MRI. Musculoskeletal: Degenerative changes of the visualized thoracolumbar spine. Mild superior endplate changes at L1. Review of the MIP images confirms the above findings. IMPRESSION: No evidence of pulmonary embolism. Left upper lobe mass extending into the left hilar region, possibly mildly decreased from prior CT. AP window nodal metastasis, mildly decreased. Multifocal pneumonia. Postobstructive opacity in the left upper lobe.  Dependent atelectasis in the bilateral lower lobes, left greater than right. Small left pleural effusion. Endotracheal tube terminates 2 cm above the carina. Debris in the lower trachea and bilateral mainstem bronchi, as above. Numerous hepatic metastases, better evaluated on recent MRI. Aortic Atherosclerosis (ICD10-I70.0) and Emphysema (ICD10-J43.9). Electronically Signed: By: Julian Hy M.D. On: 11/02/2019 04:44    Assessment: 64 y.o.   1.  Metastatic small cell lung cancer, s/p first cycle carbo and etoposide with significant dose reduction last week (completed on 3/12), on GCSF daily now  2.  Massive hemoptysis from #1 3.  History of seizure and CVA, acute encephalopathy 4. Anemia secondary to chemo, malignancy and bleeding, s/p 1u PRBC last night  5. Newly developed mild thrombocytopenia secondary to chemo  6. COPD  7. RA  Plan:  -lab and chart reviewed, continue monitor CBC closely, he will likely develop worsening neutropenia, anemia and thrombocytopenia secondary to chemotherapy in the next week, continue Granix daily for 7 days as Dr. Maylon Peppers planned -IR did not recommend embolization, pulmonary may repeat bronchoscopy and endoscopy therapy tomorrow -Dr. Maylon Peppers will resume care tomorrow. Please call us if needed.     Truitt Merle, MD 11/02/2019  3:03 PM

## 2019-11-02 NOTE — Procedures (Signed)
Intubation Procedure Note SLATE DEBROUX [id number] 1955/11/25  Procedure: Intubation Indications: Airway protection and maintenance  Procedure Details Consent: Risks of procedure as well as the alternatives and risks of each were explained to the (patient/caregiver).  Consent for procedure obtained. Time Out: Verified patient identification, verified procedure, site/side was marked, verified correct patient position, special equipment/implants available, medications/allergies/relevent history reviewed, required imaging and test results available.  Performed  Maximum sterile technique was used including cap, gloves, gown and hand hygiene.  MAC and 4 Grade 1 view  Evaluation Hemodynamic Status: BP stable throughout; O2 sats: stable throughout Patient's Current Condition: Critical Complications: No apparent complications Patient did tolerate procedure well. Chest X-ray ordered to verify placement.  CXR: pending.   Tally Due 11/02/2019

## 2019-11-02 NOTE — Progress Notes (Signed)
Examined airways: large clot occluding both airways. Will attempt cryo-extraction and cryo-coagulation of LUL mass in OR in AM. If unsuccessful may need transfer to [place] for APC +/- targeted angiography  Discussed with brother over phone and obtained permission for procedure.  Erskine Emery MD PCCM

## 2019-11-02 NOTE — Progress Notes (Signed)
[facility] Progress Note Patient Name: Drew Nguyen DOB: [birth year] MRN: [id number]   Date of Service  11/02/2019  HPI/Events of Note  Discussed with Dr Deatra Canter about double lumen ET to isolate left lung for worsening hyoxemia, needing more PEEP.   Discussed with bed side RN. Air entry decreased on rt also.   Anesthesiology in the room.  Advised risk is more with dual lumen ET than benefit . Had a good risk and benefit discussion with Deatra Canter, Anesthesiologist.   eICU Interventions  - getting vecuronium, PEEP-14, rate 20.  - once stable to CTA IR.  - too unstable for Bronchoscopy balloon or local glue for hemoptysis. - tranexamic systemic no role.           Elmer Sow 11/02/2019, 3:27 AM

## 2019-11-02 NOTE — Progress Notes (Signed)
CRITICAL VALUE ALERT  Critical Value:  PH 7.126, PaCO2 95  Date & Time Notied:  11/02/19 0147  Provider Notified: Dr. Deatra Canter  Orders Received/Actions taken: awaiting new orders

## 2019-11-02 NOTE — Progress Notes (Signed)
Back from CT at this time. After CT was complete and moving pt back to bed, Pt had drop in exhaled volumes down to 130'smL and peak pressuring in the 50's. RT suctioned with no return before departing CT. Volumes came back up to 490-531mL. SATs remained 100% during episode of drop in exhaled volumes and on transport.

## 2019-11-02 NOTE — Progress Notes (Signed)
Pharmacy Antibiotic Note  Drew Nguyen is a 64 y.o. male admitted on 10/15/2019 with pneumonia.  Pharmacy has been consulted for Vancomycin/Cefepime dosing. Transferred from [facility] to [facility] for control of hemoptysis in setting of lung cancer. WBC is increasing. Renal function ok.   Plan: Vancomycin 1000 mg IV q12h >>Estimated AUC: 481 Cefepime 2g IV q8h Trend WBC, temp, renal function  F/U infectious work-up Drug levels as indicated   Height: 5\' 6"  (167.6 cm) Weight: 137 lb 12.6 oz (62.5 kg) IBW/kg (Calculated) : 63.8  Temp (24hrs), Avg:98 F (36.7 C), Min:97.5 F (36.4 C), Max:98.6 F (37 C)  Recent Labs  Lab 10/29/19 0530 10/29/19 0530 10/30/19 0443 10/31/19 0500 11/01/19 0938 11/01/19 1907 11/01/19 2347  WBC 13.0*   < > 11.1* 9.1 9.4 12.8* 16.2*  CREATININE 0.71  --  0.56* 0.60* 0.52*  --  0.64   < > = values in this interval not displayed.    Estimated Creatinine Clearance: 82.5 mL/min (by C-G formula based on SCr of 0.64 mg/dL).    Allergies  Allergen Reactions  . Celexa [Citalopram] Other (See Comments)    Over sedation Sleeps too much    Narda Bonds, PharmD, [place] Pharmacist Phone: [phone]

## 2019-11-02 NOTE — Plan of Care (Signed)
Patient was noted to have an episode of increased bloody output from ET tube with drop in his oxygenation and tidal volume.  He was initially down to 50% FiO2 which has to be increased to 100% with sats in low 80s.  Subsequently has tidal volumes improved.  Catheter was easily passed through the ET tube.  His plateau pressures have also increased over time, signifying further parenchymal bleeding leading to poor compliance.  We consulted anesthesia for possible double-lumen endotracheal tube.  Anesthesiologist assessed the patient at the bedside and advised against double-lumen tube due to frequent tube displacement and poor ventilation.  Eventually his sats did come up above 88% by increasing PEEP.  Plan to get CTA chest as soon as it safe to do.  Bronchoscopy with already low oxygen saturation would be very high risk procedure with the low yield.

## 2019-11-02 NOTE — Procedures (Signed)
Bronchoscopy Procedure Note DARLY FAILS [id number] 11/20/1955  Procedure: Bronchoscopy Indications: Diagnostic evaluation of the airways  Procedure Details Consent: Risks of procedure as well as the alternatives and risks of each were explained to the (patient/caregiver).  Consent for procedure obtained. Time Out: Verified patient identification, verified procedure, site/side was marked, verified correct patient position, special equipment/implants available, medications/allergies/relevent history reviewed, required imaging and test results available.  Performed  In preparation for procedure, patient was given 100% FiO2 and bronchoscope lubricated. Sedation: On propofol and fentanyl with RASS -4, Vecuronium given  Findings: Airway entered and the following bronchi were examined: Airway exam was done with intent to clear some of the blood cots seen on CTA chest in distal trachea and R and L mainstem bronchi. Organised fibrinous clot seen partially occluding the ETT, his entire trachea was partially occluded by fibrinous clot which could not be suctioned out and was adherent to posteriorly. Unable to identify main carina and unable to advance beyond distal trachea.  No active bleeding was seen and patient is oxygenating well with good tidal volumes, although he remains very high risk for airway occlusion. Repeat Blood gas for CO2. Plan for possible IR intervention for embolization. Will assess for therapeutic suctioning in bronchoscopy suite after IR intervention.    Procedures performed: Therapeutic suctioning - unsuccessful Bronchoscope removed.    Evaluation Hemodynamic Status: BP stable throughout; O2 sats: stable throughout Patient's Current Condition: Critical Specimens:  None Complications: No apparent complications Patient did tolerate procedure well.   Tally Due 11/02/2019

## 2019-11-03 ENCOUNTER — Inpatient Hospital Stay (HOSPITAL_COMMUNITY): Payer: Medicaid Other

## 2019-11-03 ENCOUNTER — Inpatient Hospital Stay (HOSPITAL_COMMUNITY): Payer: Medicaid Other | Admitting: Certified Registered"

## 2019-11-03 ENCOUNTER — Ambulatory Visit: Payer: Medicaid Other

## 2019-11-03 ENCOUNTER — Encounter (HOSPITAL_COMMUNITY): Admission: EM | Disposition: E | Payer: Self-pay | Source: Home / Self Care | Attending: Family Medicine

## 2019-11-03 HISTORY — PX: VIDEO BRONCHOSCOPY: SHX5072

## 2019-11-03 LAB — BASIC METABOLIC PANEL
Anion gap: 10 (ref 5–15)
Anion gap: 9 (ref 5–15)
BUN: 29 mg/dL — ABNORMAL HIGH (ref 8–23)
BUN: 34 mg/dL — ABNORMAL HIGH (ref 8–23)
CO2: 27 mmol/L (ref 22–32)
CO2: 23 mmol/L (ref 22–32)
Calcium: 7.3 mg/dL — ABNORMAL LOW (ref 8.9–10.3)
Calcium: 7.2 mg/dL — ABNORMAL LOW (ref 8.9–10.3)
Chloride: 108 mmol/L (ref 98–111)
Chloride: 109 mmol/L (ref 98–111)
Creatinine, Ser: 0.61 mg/dL (ref 0.61–1.24)
Creatinine, Ser: 0.72 mg/dL (ref 0.61–1.24)
GFR calc Af Amer: >60 mL/min (ref >60)
GFR calc Af Amer: >60 mL/min (ref >60)
GFR calc non Af Amer: >60 mL/min (ref >60)
GFR calc non Af Amer: >60 mL/min (ref >60)
Glucose, Bld: 152 mg/dL — ABNORMAL HIGH (ref 70–99)
Glucose, Bld: 264 mg/dL — ABNORMAL HIGH (ref 70–99)
Potassium: 4 mmol/L (ref 3.5–5.1)
Potassium: 4.8 mmol/L (ref 3.5–5.1)
Sodium: 145 mmol/L (ref 135–145)
Sodium: 141 mmol/L (ref 135–145)

## 2019-11-03 LAB — CBC
HCT: 23.6 % — ABNORMAL LOW (ref 39.0–52.0)
HCT: 25 % — ABNORMAL LOW (ref 39.0–52.0)
Hemoglobin: 7.8 g/dL — ABNORMAL LOW (ref 13.0–17.0)
Hemoglobin: 7.9 g/dL — ABNORMAL LOW (ref 13.0–17.0)
MCH: 32 pg (ref 26.0–34.0)
MCH: 31 pg (ref 26.0–34.0)
MCHC: 33.1 g/dL (ref 30.0–36.0)
MCHC: 31.6 g/dL (ref 30.0–36.0)
MCV: 96.7 fL (ref 80.0–100.0)
MCV: 98 fL (ref 80.0–100.0)
Platelets: 129 10*3/uL — ABNORMAL LOW (ref 150–400)
Platelets: 144 10*3/uL — ABNORMAL LOW (ref 150–400)
RBC: 2.44 MIL/uL — ABNORMAL LOW (ref 4.22–5.81)
RBC: 2.55 MIL/uL — ABNORMAL LOW (ref 4.22–5.81)
RDW: 13.7 % (ref 11.5–15.5)
RDW: 14 % (ref 11.5–15.5)
WBC: 7.4 10*3/uL (ref 4.0–10.5)
WBC: 3 10*3/uL — ABNORMAL LOW (ref 4.0–10.5)
nRBC: 0 % (ref 0.0–0.2)
nRBC: 0 % (ref 0.0–0.2)

## 2019-11-03 LAB — GLUCOSE, CAPILLARY
Glucose-Capillary: 163 mg/dL — ABNORMAL HIGH (ref 70–99)
Glucose-Capillary: 146 mg/dL — ABNORMAL HIGH (ref 70–99)
Glucose-Capillary: 164 mg/dL — ABNORMAL HIGH (ref 70–99)
Glucose-Capillary: 218 mg/dL — ABNORMAL HIGH (ref 70–99)

## 2019-11-03 LAB — APTT: aPTT: 27 seconds (ref 24–36)

## 2019-11-03 LAB — TYPE AND SCREEN
ABO/RH(D): A POS
Antibody Screen: NEGATIVE
Unit division: 0

## 2019-11-03 LAB — BPAM RBC
Blood Product Expiration Date: 202104112359
ISSUE DATE / TIME: 202103140649
Unit Type and Rh: 6200

## 2019-11-03 LAB — PHOSPHORUS
Phosphorus: 3.1 mg/dL (ref 2.5–4.6)
Phosphorus: 4.2 mg/dL (ref 2.5–4.6)

## 2019-11-03 LAB — SURGICAL PCR SCREEN
MRSA, PCR: NEGATIVE
Staphylococcus aureus: NEGATIVE

## 2019-11-03 LAB — PROTIME-INR
INR: 1 (ref 0.8–1.2)
Prothrombin Time: 13.1 seconds (ref 11.4–15.2)

## 2019-11-03 LAB — MAGNESIUM
Magnesium: 2.2 mg/dL (ref 1.7–2.4)
Magnesium: 2 mg/dL (ref 1.7–2.4)

## 2019-11-03 IMAGING — DX DG ABDOMEN 1V
1 series · 1 of 1 positions shown · non-contrast
Comparison: Radiograph [DATE] 312 hours

CLINICAL DATA: NG tube advanced

EXAM:
ABDOMEN - 1 VIEW

[abdomen kub]
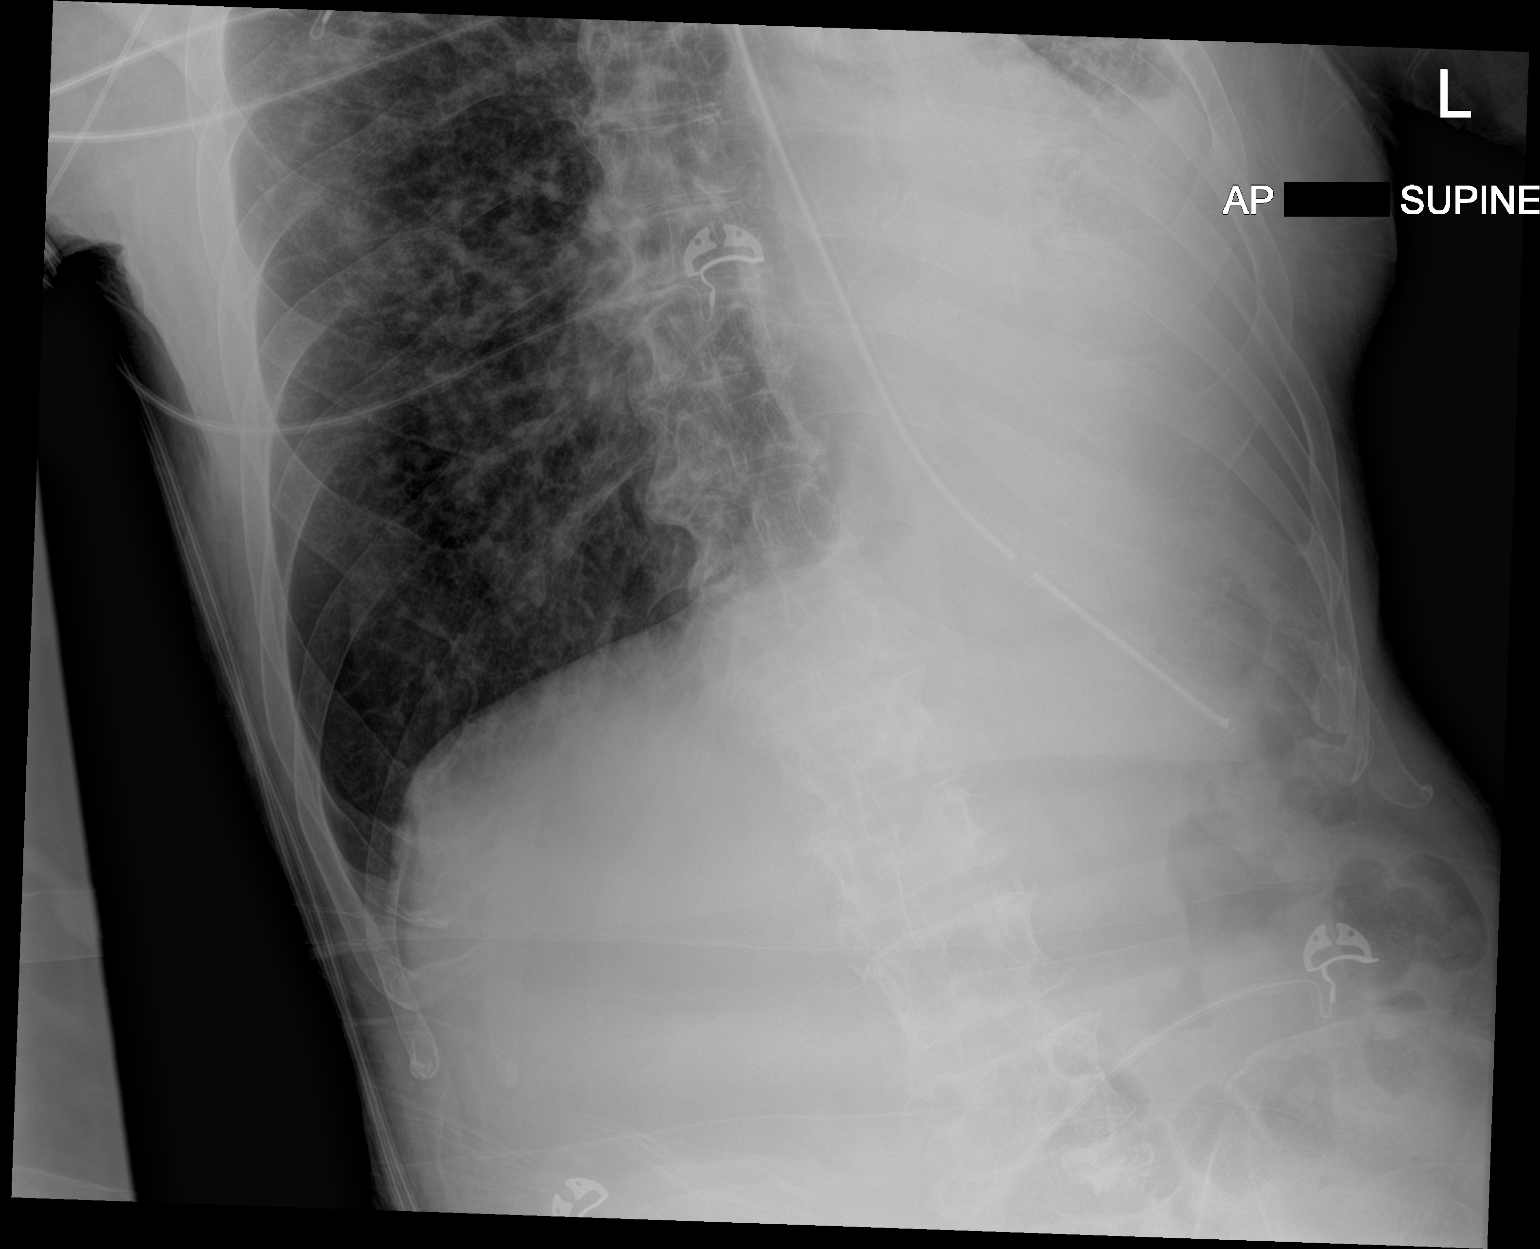

[1 of 1 positions shown; findings below may reference images not displayed]

FINDINGS: NG tube is been advanced slightly such that the side port is at the
GE junction advancement by approximately 4 cm.

Complete opacification of the LEFT lower lobe.
IMPRESSION: 1. Advancement of NG tube by several cm such that the side port at
the GE junction.
2. Dense opacification LEFT lower lobe.

## 2019-11-03 IMAGING — DX DG CHEST 1V PORT
2 series · 2 of 2 positions shown · non-contrast
Comparison: [DATE].

CLINICAL DATA: Endotracheal tube placement.

EXAM:
PORTABLE CHEST 1 VIEW

[chest ap (1 of 2)]
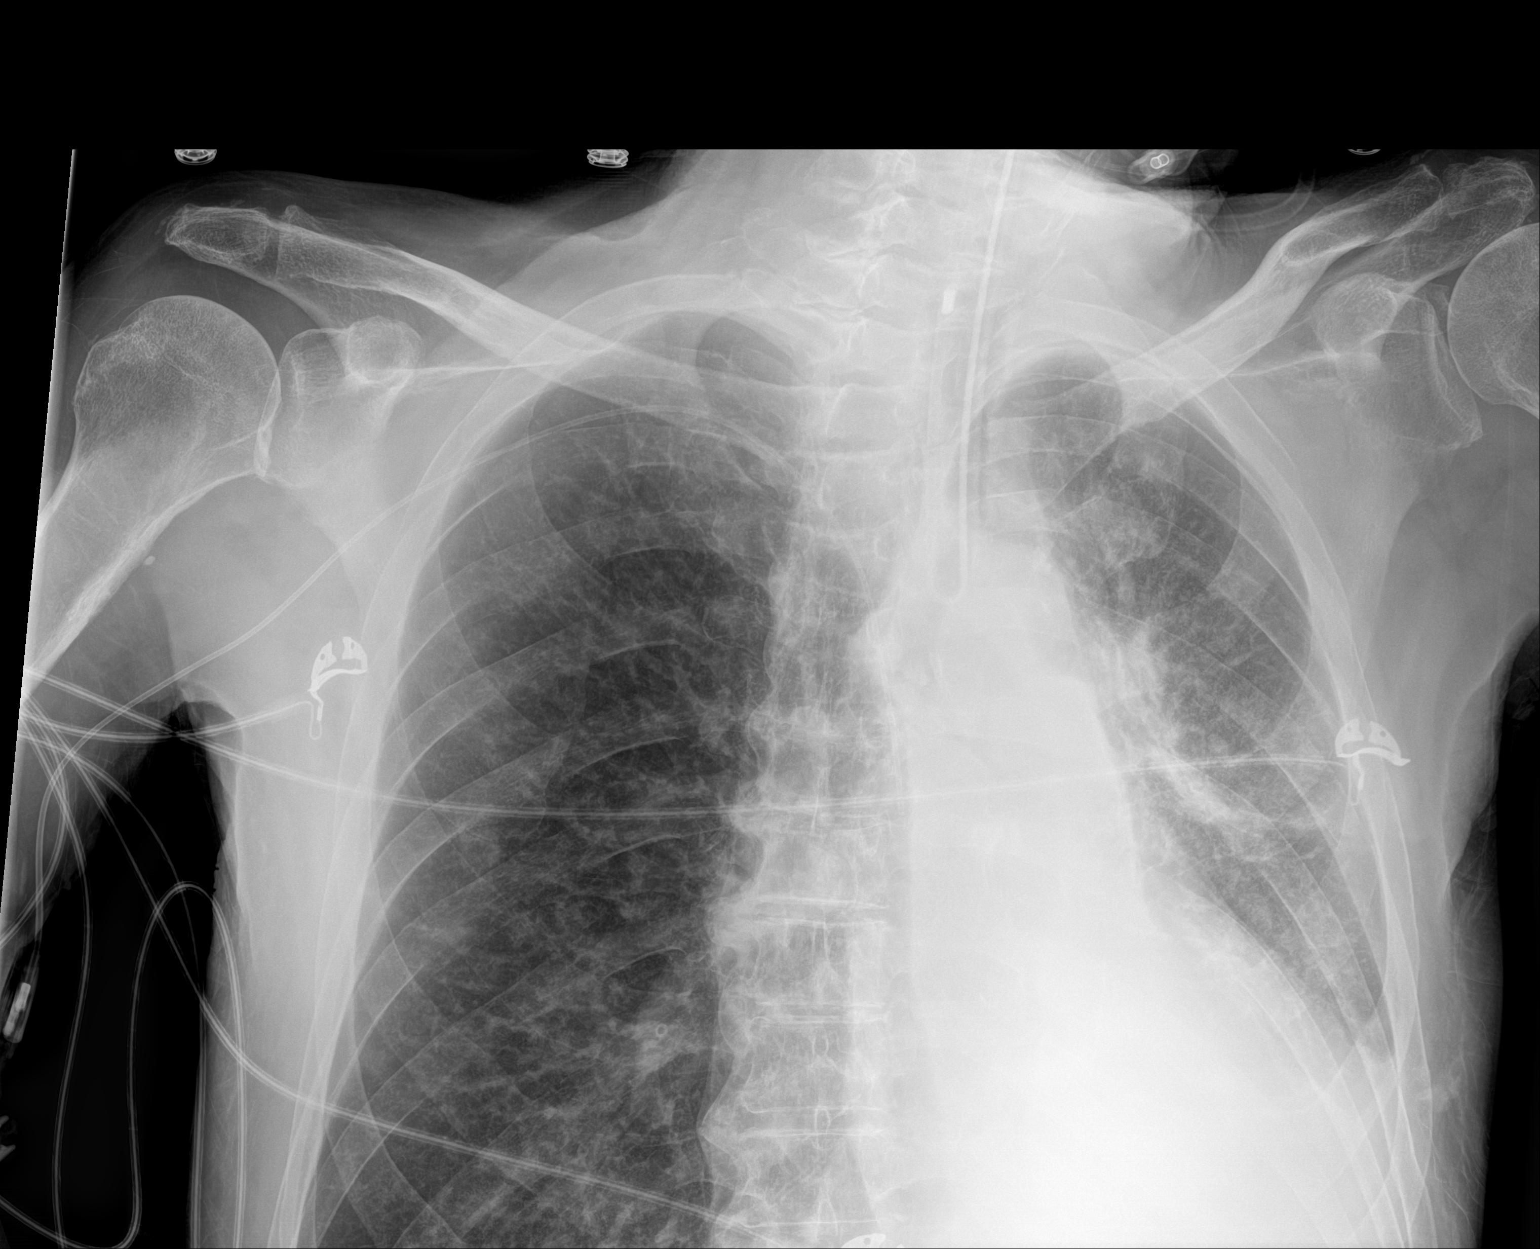

[chest ap (2 of 2)]
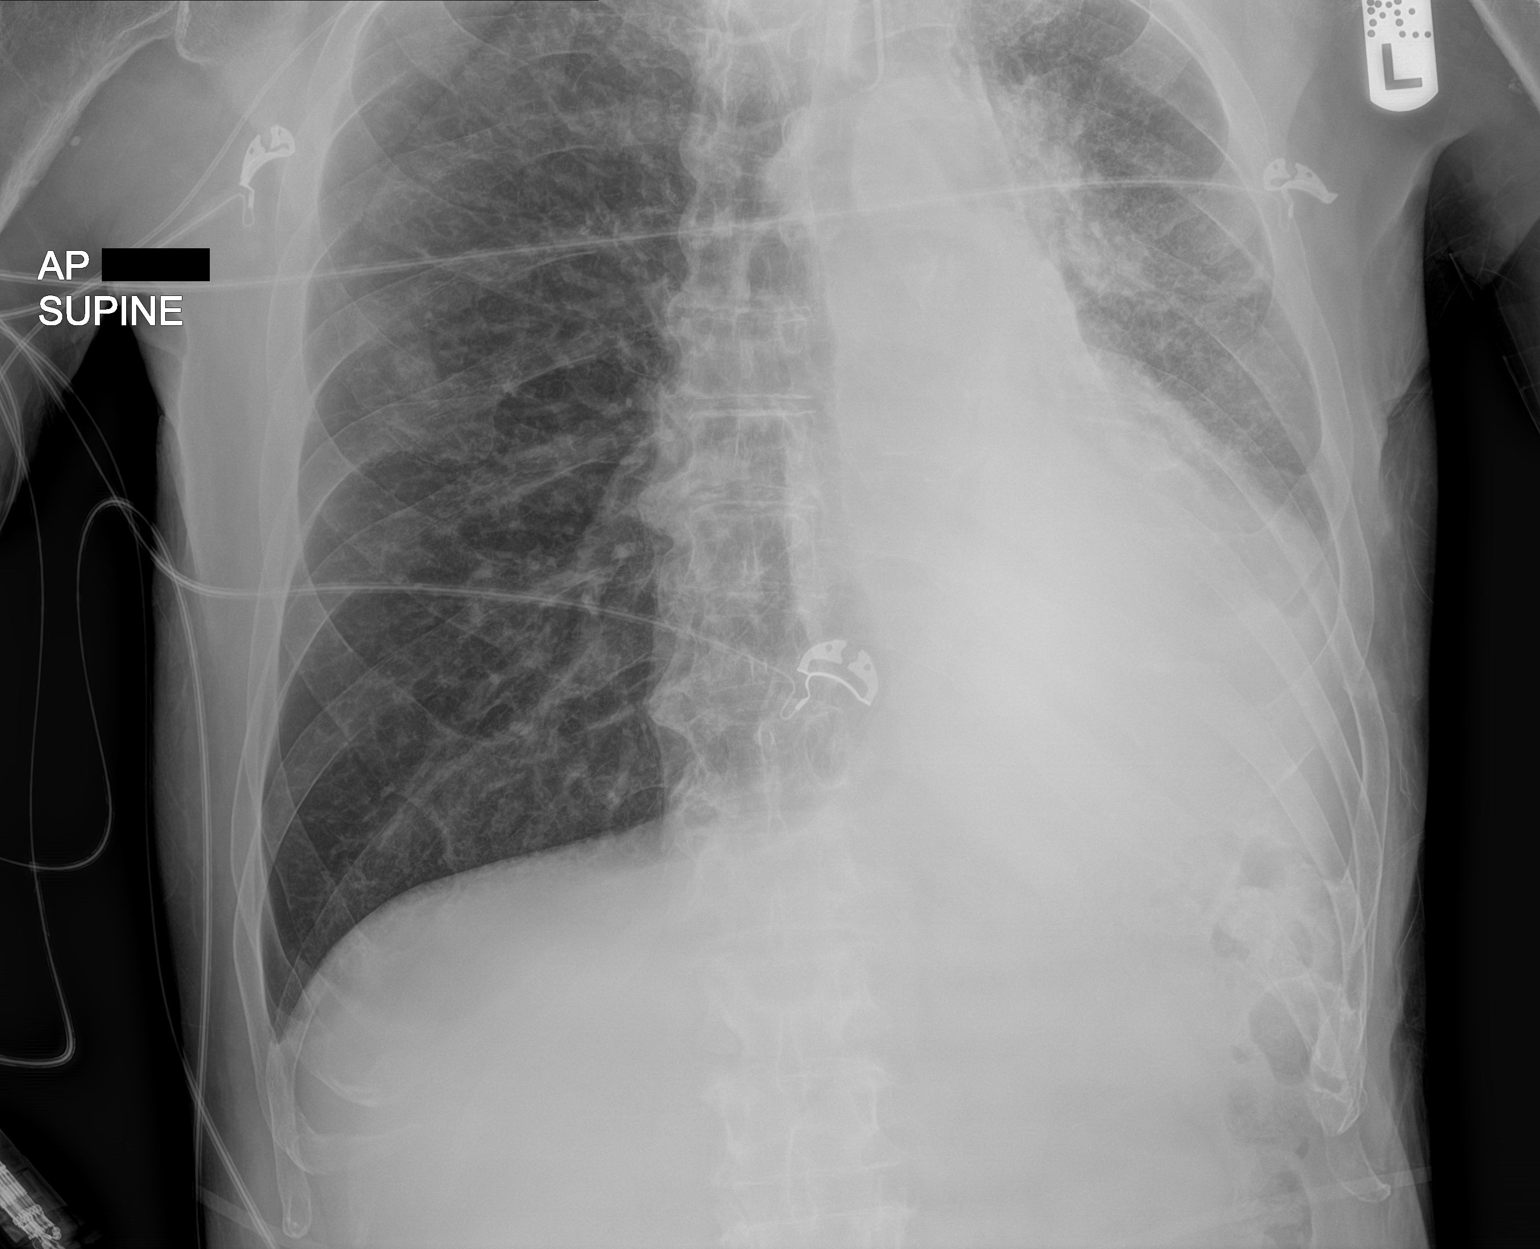

[2 of 2 positions shown; findings below may reference images not displayed]

FINDINGS: Stable cardiomegaly. Endotracheal tube tip appears to be at the
carina; withdrawal by 2-3 cm is recommended. Patient is rotated to
the left. Right-sided PICC line is unchanged in position with tip in
expected position of cavoatrial junction. No pneumothorax is noted.
Left basilar atelectasis or infiltrate is noted with associated
effusion. Bony thorax is unremarkable.
IMPRESSION: Endotracheal tube tip appears to be at the carina; withdrawal by 2-3
cm is recommended. Left basilar atelectasis or infiltrate is noted
with associated pleural effusion. These results will be called to
the ordering clinician or representative by the Radiologist
Assistant, and communication documented in the PACS or zVision
Dashboard.

## 2019-11-03 IMAGING — DX DG ABDOMEN 1V
1 series · 1 of 1 positions shown · non-contrast
Comparison: None

CLINICAL DATA: Tube placement

EXAM:
ABDOMEN - 1 VIEW

[abdomen]
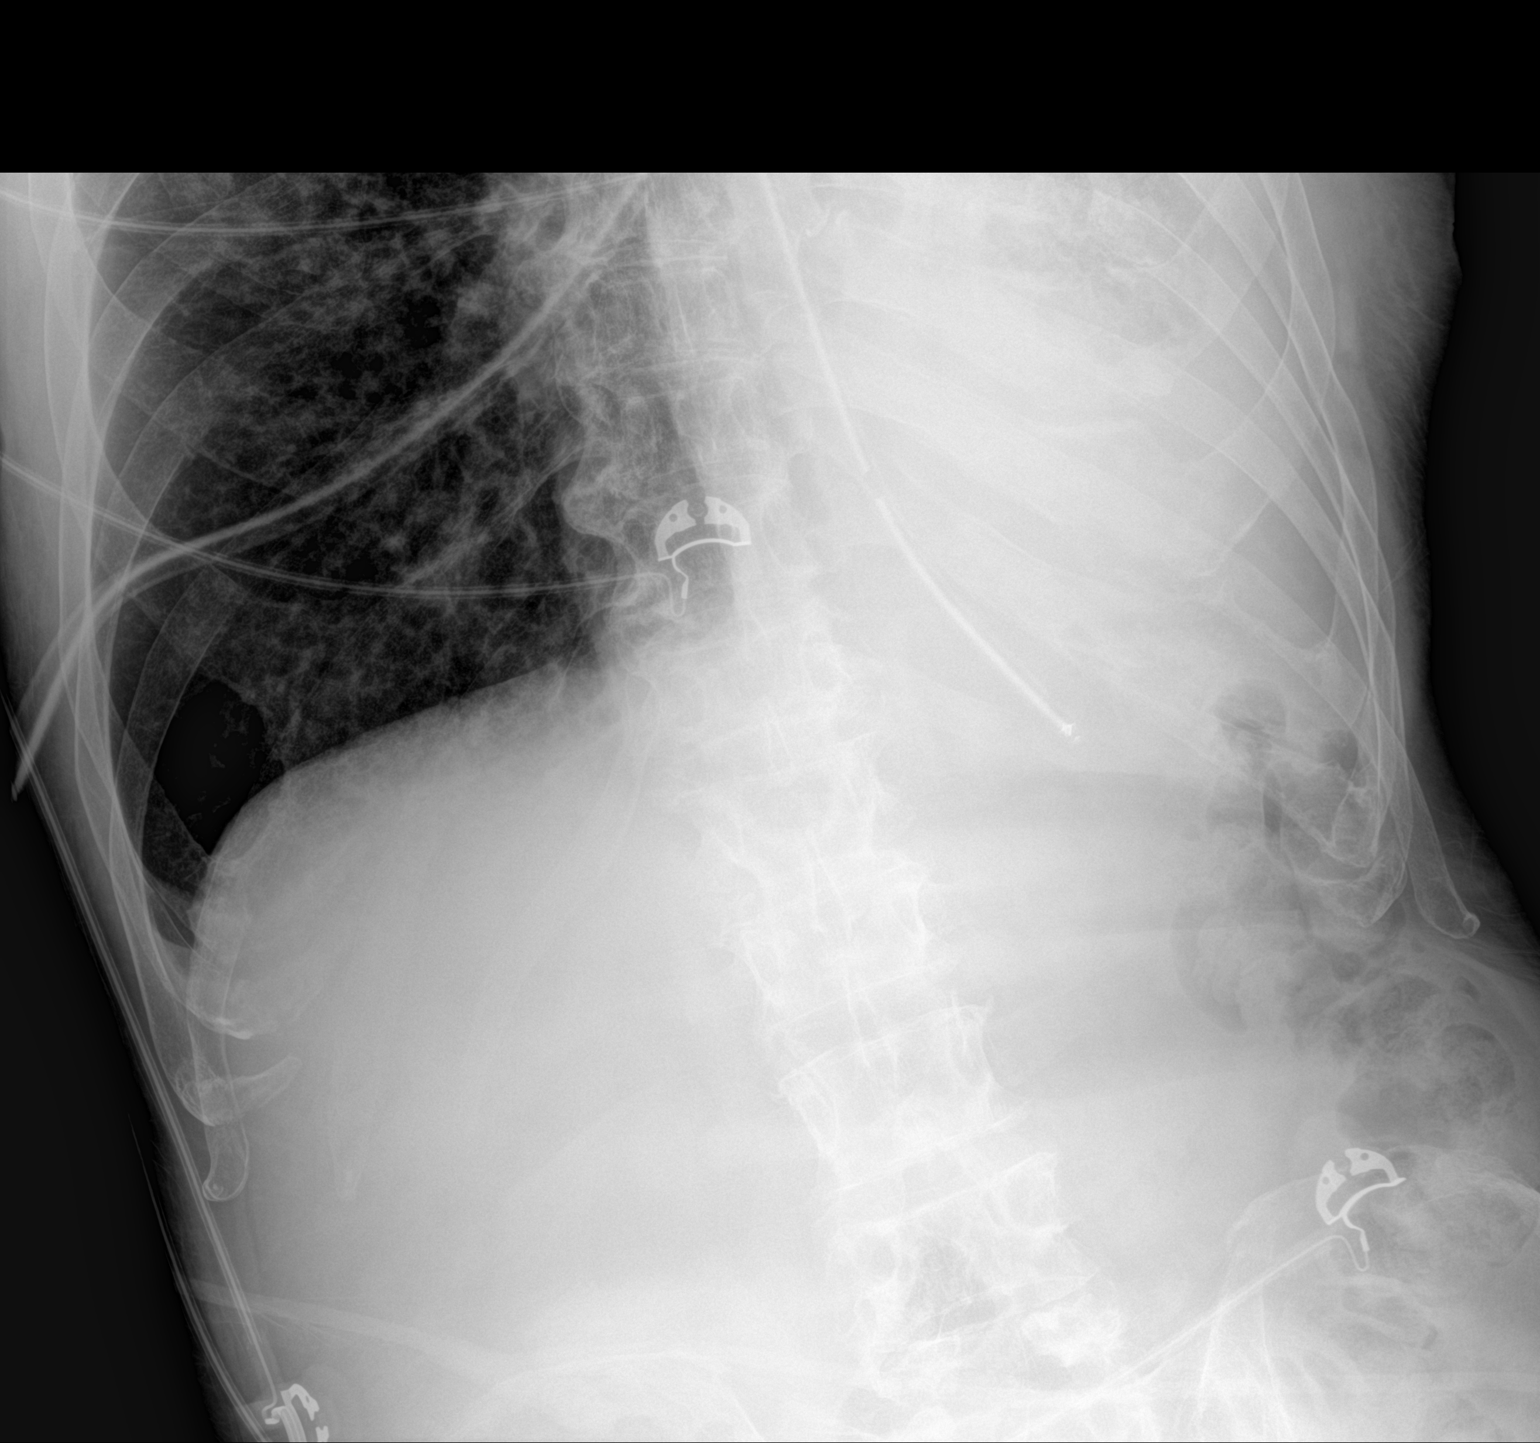

[1 of 1 positions shown; findings below may reference images not displayed]

FINDINGS: Enteric tube passes into the stomach. Side port remains within the
distal esophagus. Bowel gas pattern is unremarkable on this view.
IMPRESSION: Enteric tube terminates within the stomach with side port within the
distal esophagus. Consider advancing for more optimal positioning.

## 2019-11-03 SURGERY — BRONCHOSCOPY, VIDEO-ASSISTED
Anesthesia: General

## 2019-11-03 MED ORDER — SODIUM CHLORIDE 0.9 % IR SOLN
Status: DC | PRN
  Administered 2019-11-03: 3000 mL

## 2019-11-03 MED ORDER — MIDAZOLAM HCL 2 MG/2ML IJ SOLN
INTRAMUSCULAR | Status: AC
  Filled 2019-11-03: qty 2

## 2019-11-03 MED ORDER — AMIODARONE LOAD VIA INFUSION
150.0000 mg | Freq: Once | INTRAVENOUS | Status: AC
  Filled 2019-11-03: qty 83.34

## 2019-11-03 MED ORDER — AMIODARONE HCL IN DEXTROSE 360-4.14 MG/200ML-% IV SOLN
30.0000 mg/h | INTRAVENOUS | Status: DC
  Administered 2019-11-03: 30 mg/h via INTRAVENOUS
  Filled 2019-11-03 (×2): qty 200

## 2019-11-03 MED ORDER — ONDANSETRON HCL 4 MG/2ML IJ SOLN
INTRAMUSCULAR | Status: DC | PRN
  Administered 2019-11-03: 4 mg via INTRAVENOUS

## 2019-11-03 MED ORDER — ALBUMIN HUMAN 5 % IV SOLN
12.5000 g | Freq: Once | INTRAVENOUS | Status: AC
  Administered 2019-11-03: 12.5 g via INTRAVENOUS
  Filled 2019-11-03: qty 250

## 2019-11-03 MED ORDER — EPINEPHRINE PF 1 MG/ML IJ SOLN
INTRAMUSCULAR | Status: DC | PRN
  Administered 2019-11-03: 1 mg

## 2019-11-03 MED ORDER — MIDAZOLAM HCL 5 MG/5ML IJ SOLN
INTRAMUSCULAR | Status: DC | PRN
  Administered 2019-11-03: 2 mg via INTRAVENOUS

## 2019-11-03 MED ORDER — AMIODARONE HCL IN DEXTROSE 360-4.14 MG/200ML-% IV SOLN
60.0000 mg/h | INTRAVENOUS | Status: AC
  Administered 2019-11-03 (×2): 60 mg/h via INTRAVENOUS
  Filled 2019-11-03: qty 200

## 2019-11-03 MED ORDER — VITAL HIGH PROTEIN PO LIQD
1000.0000 mL | ORAL | Status: DC
  Administered 2019-11-03: 1000 mL

## 2019-11-03 MED ORDER — ROCURONIUM BROMIDE 10 MG/ML (PF) SYRINGE
PREFILLED_SYRINGE | INTRAVENOUS | Status: AC
  Filled 2019-11-03: qty 10

## 2019-11-03 MED ORDER — ROCURONIUM BROMIDE 50 MG/5ML IV SOSY
PREFILLED_SYRINGE | INTRAVENOUS | Status: DC | PRN
  Administered 2019-11-03: 100 mg via INTRAVENOUS

## 2019-11-03 MED ORDER — IPRATROPIUM-ALBUTEROL 0.5-2.5 (3) MG/3ML IN SOLN
3.0000 mL | Freq: Four times a day (QID) | RESPIRATORY_TRACT | Status: DC
  Administered 2019-11-03 – 2019-11-04 (×7): 3 mL via RESPIRATORY_TRACT
  Filled 2019-11-03 (×7): qty 3

## 2019-11-03 MED ORDER — VITAL HIGH PROTEIN PO LIQD: 1000.0000 mL | ORAL | Status: DC

## 2019-11-03 MED ORDER — AMIODARONE HCL IN DEXTROSE 360-4.14 MG/200ML-% IV SOLN
INTRAVENOUS | Status: AC
  Administered 2019-11-03: 150 mg via INTRAVENOUS
  Filled 2019-11-03: qty 200

## 2019-11-03 MED ORDER — LACTATED RINGERS IV SOLN: INTRAVENOUS | Status: DC | PRN

## 2019-11-03 MED ORDER — LACTATED RINGERS IV BOLUS
500.0000 mL | Freq: Once | INTRAVENOUS | Status: AC
  Administered 2019-11-03: 500 mL via INTRAVENOUS

## 2019-11-03 MED ORDER — 0.9 % SODIUM CHLORIDE (POUR BTL) OPTIME
TOPICAL | Status: DC | PRN
  Administered 2019-11-03: 12:00:00 1000 mL

## 2019-11-03 MED ORDER — FENTANYL CITRATE (PF) 100 MCG/2ML IJ SOLN
INTRAMUSCULAR | Status: DC | PRN
  Administered 2019-11-03: 50 ug via INTRAVENOUS

## 2019-11-03 MED ORDER — DEXAMETHASONE SODIUM PHOSPHATE 10 MG/ML IJ SOLN
INTRAMUSCULAR | Status: DC | PRN
  Administered 2019-11-03: 10 mg via INTRAVENOUS

## 2019-11-03 MED ORDER — FENTANYL CITRATE (PF) 250 MCG/5ML IJ SOLN
INTRAMUSCULAR | Status: AC
  Filled 2019-11-03: qty 5

## 2019-11-03 MED ORDER — ROCURONIUM BROMIDE 50 MG/5ML IV SOLN: 60.0000 mg | Freq: Once | INTRAVENOUS | Status: AC

## 2019-11-03 MED ORDER — PRO-STAT SUGAR FREE PO LIQD: 30.0000 mL | Freq: Two times a day (BID) | ORAL | Status: DC

## 2019-11-03 MED ORDER — ROCURONIUM BROMIDE 10 MG/ML (PF) SYRINGE
PREFILLED_SYRINGE | INTRAVENOUS | Status: AC
  Administered 2019-11-03: 60 mg
  Filled 2019-11-03: qty 10

## 2019-11-03 MED ORDER — PROPOFOL 10 MG/ML IV BOLUS
INTRAVENOUS | Status: AC
  Filled 2019-11-03: qty 20

## 2019-11-03 SURGICAL SUPPLY — 31 items
BAG DECANTER FOR FLEXI CONT (MISCELLANEOUS) ×2 IMPLANT
BRUSH CYTOL CELLEBRITY 1.5X140 (MISCELLANEOUS) IMPLANT
CANISTER SUCT 3000ML PPV (MISCELLANEOUS) ×3 IMPLANT
CNTNR URN SCR LID CUP LEK RST (MISCELLANEOUS) ×2 IMPLANT
CONT SPEC 4OZ STRL OR WHT (MISCELLANEOUS) ×6
COVER BACK TABLE 60X90IN (DRAPES) ×3 IMPLANT
COVER WAND RF STERILE (DRAPES) ×3 IMPLANT
CRYOPROBE FLEX 2.4X1.5 DISP (PROBE) ×2 IMPLANT
FILTER STRAW FLUID ASPIR (MISCELLANEOUS) IMPLANT
FORCEPS BIOP RJ4 1.8 (CUTTING FORCEPS) IMPLANT
FORCEPS RADIAL JAW LRG 4 PULM (INSTRUMENTS) IMPLANT
GAUZE SPONGE 4X4 12PLY STRL (GAUZE/BANDAGES/DRESSINGS) ×5 IMPLANT
GLOVE BIO SURGEON STRL SZ7.5 (GLOVE) ×5 IMPLANT
GLOVE BIO SURGEON STRL SZ8 (GLOVE) ×2 IMPLANT
GOWN STRL REUS W/ TWL LRG LVL3 (GOWN DISPOSABLE) ×1 IMPLANT
GOWN STRL REUS W/ TWL XL LVL3 (GOWN DISPOSABLE) ×1 IMPLANT
GOWN STRL REUS W/TWL LRG LVL3 (GOWN DISPOSABLE) ×6
GOWN STRL REUS W/TWL XL LVL3 (GOWN DISPOSABLE) ×9
KIT CLEAN ENDO COMPLIANCE (KITS) ×3 IMPLANT
KIT TURNOVER KIT B (KITS) ×3 IMPLANT
MARKER SKIN DUAL TIP RULER LAB (MISCELLANEOUS) ×3 IMPLANT
NS IRRIG 1000ML POUR BTL (IV SOLUTION) ×3 IMPLANT
OIL SILICONE PENTAX (PARTS (SERVICE/REPAIRS)) ×3 IMPLANT
PAD ARMBOARD 7.5X6 YLW CONV (MISCELLANEOUS) ×6 IMPLANT
RADIAL JAW LRG 4 PULMONARY (INSTRUMENTS) ×2
SYR 20ML ECCENTRIC (SYRINGE) ×6 IMPLANT
SYR 5ML LL (SYRINGE) ×3 IMPLANT
SYR 5ML LUER SLIP (SYRINGE) ×3 IMPLANT
TRAP SPECIMEN MUCOUS 40CC (MISCELLANEOUS) ×3 IMPLANT
TUBE CONNECTING 20'X1/4 (TUBING) ×1
TUBE CONNECTING 20X1/4 (TUBING) ×2 IMPLANT

## 2019-11-03 NOTE — Procedures (Signed)
Bedside Bronchoscopy Procedure Note HRISHIKESH HOEG [id number] [birth year]  Procedure: Bronchoscopy Indications: Diagnostic evaluation of the airways and Remove secretions  Procedure Details: ET Tube Size: ET Tube secured at lip (cm): Bite block in place: No In preparation for procedure, Patient hyper-oxygenated with 100 % FiO2 Airway entered and the following bronchi were examined: RUL, RML, RLL, LUL, LLL and Bronchi.   Bronchoscope removed.    Evaluation BP 122/75   Pulse 65   Temp 97.7 F (36.5 C) (Oral)   Resp 20   Ht 5\' 6"  (1.676 m)   Wt 62 kg   SpO2 100%   BMI 22.06 kg/m  Breath Sounds:Clear and Diminished O2 sats: stable throughout Patient's Current Condition: stable Specimens:  None Complications: No apparent complications Patient did tolerate procedure well.   Kathie Dike 10/30/2019, 8:45 AM

## 2019-11-03 NOTE — Progress Notes (Signed)
[place] Progress Note Patient Name: NIV DARLEY DOB: 1955-12-08 MRN: [id number]   Date of Service  10/28/2019  HPI/Events of Note  Pt with negative fluid balance of 7 liters and BP of 81/61. Pt also has Afib with RVR but he's on an Amiodarone infusion.  eICU Interventions  Check CVP, 12.5 gm Albumin bolus, Check  BMET, Mg        Taeya Theall U Ambreen Tufte 10/29/2019, 8:28 PM

## 2019-11-03 NOTE — Progress Notes (Signed)
Initial Nutrition Assessment  DOCUMENTATION CODES:   Not applicable  INTERVENTION:    Vital High Protein at 50 ml/h (1200 ml per day)   Provides 1200 kcal (1599 kcal total with propofol), 105 gm protein, 1003 ml free water daily  NUTRITION DIAGNOSIS:   Inadequate oral intake related to inability to eat as evidenced by NPO status.  GOAL:   Patient will meet greater than or equal to 90% of their needs  MONITOR:   Vent status, Labs, TF tolerance  REASON FOR ASSESSMENT:   Ventilator, Consult Enteral/tube feeding initiation and management  ASSESSMENT:   64 yo male admitted with encephalopathy. 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 PMH includes HTN, RA, COPD, current smoker, depression.   Patient has been receiving chemo and radiation therapy at [facility] up until he had massive hemoptysis and was transferred to [facility] on 3/13. Intubated around midnight 3/14.  Received MD Consult for TF initiation and management. RN to begin TF after patient returns from his procedure.   Patient is currently intubated on ventilator support MV: 9.5 L/min Temp (24hrs), Avg:97.5 F (36.4 C), Min:96.7 F (35.9 C), Max:97.8 F (36.6 C)  Propofol: 15.1 ml/hr providing 399 kcal from lipid.  Labs reviewed.  CBG's: 163-146  Medications reviewed and include deltasone, senokot-s, levophed, propofol.   NUTRITION - FOCUSED PHYSICAL EXAM:  unable to complete, patient out of room  Diet Order:   Diet Order            Diet NPO time specified  Diet effective now              EDUCATION NEEDS:   Not appropriate for education at this time  Skin:  Skin Assessment: Reviewed RN Assessment  Last BM:  3/9  Height:   Ht Readings from Last 1 Encounters:  11/01/19 5\' 6"  (1.676 m)    Weight:   Wt Readings from Last 1 Encounters:  11/02/19 62 kg    Ideal Body Weight:  64.5 kg  BMI:  Body mass index is 22.06 kg/m.  Estimated Nutritional Needs:   Kcal:   1564  Protein:  90-105 gm  Fluid:  >/= 1.6 L    Molli Barrows, RD, LDN, CNSC Please refer to Amion for contact information.

## 2019-11-03 NOTE — Progress Notes (Signed)
PT Cancellation Note  Patient Details Name: SKIP LITKE MRN: [id number] DOB: 1955-12-16   Cancelled Treatment:    Reason Eval/Treat Not Completed: Medical issues which prohibited therapy(Pt going to OR today and is sedated. Will check back tomorrow.)   Denice Paradise 11/07/2019, 1:24 PM  Jesicca Dipierro W,PT Acute Rehabilitation Services Pager:  [phone]  Office:  [phone]

## 2019-11-03 NOTE — Progress Notes (Signed)
Radiation oncology clinic closed over the weekend. Returned today to discover patient was transferred on 11/01/2019 to [facility] ICU for massive severe hemoptysis and intubated. Relayed findings to Dr. Tyler Pita. As requested by Dr. Tammi Klippel I phoned ICU to inquire if the team wants to continue radiation therapy. Spoke with Quintin Alto, RN who connected with Dr. Kipp Brood who verbalized radiation should be HELD at this time and he will notify our team when he is ready for the patient to resume. Verbalized these findings to Dr. Tammi Klippel and the L[facility] radiation team. The following was collected from Dr. Michelle Piper progress note today: May require radiation and chemotherapy tumor debulking prior to successful extubation. Radiation team will await contact from [place].

## 2019-11-03 NOTE — Procedures (Signed)
Patient positioned in supine position Bougee placed down 8-0 ETT while RT holding glidescope 8-0 ETT removed and 9-0 ETT placed over bougee Bougee removed No immediate complications. Sats stable  Erskine Emery MD PCCM

## 2019-11-03 NOTE — Transfer of Care (Signed)
Immediate Anesthesia Transfer of Care Note  Patient: Drew Nguyen  Procedure(s) Performed: VIDEO BRONCHOSCOPY WITH ERBE CRYO PROBE (N/A )  Patient Location: ICU  Anesthesia Type:General  Level of Consciousness: patient cooperative and Patient remains intubated per anesthesia plan  Airway & Oxygen Therapy: Patient remains intubated per anesthesia plan and Patient placed on Ventilator (see vital sign flow sheet for setting)  Post-op Assessment: Report given to RN and Post -op Vital signs reviewed and stable  Post vital signs: Reviewed and stable  Last Vitals:  Vitals Value Taken Time  BP 117/65 10/24/2019 1400  Temp    Pulse 107 11/11/2019 1404  Resp 20 10/26/2019 1404  SpO2 93 % 10/30/2019 1404  Vitals shown include unvalidated device data.  Last Pain:  Vitals:   11/16/2019 1100  TempSrc: Axillary  PainSc:       Patients Stated Pain Goal: 0 (02/33/43 5686)  Complications: No apparent anesthesia complications

## 2019-11-03 NOTE — Anesthesia Preprocedure Evaluation (Addendum)
Anesthesia Evaluation  Patient identified by MRN, date of birth, ID band Patient awake    Reviewed: Allergy & Precautions, H&P , NPO status , Patient's Chart, lab work & pertinent test results  Airway Mallampati: Intubated       Dental no notable dental hx. (+) Teeth Intact, Dental Advisory Given   Pulmonary COPD,  COPD inhaler, Current Smoker and Patient abstained from smoking.,    Pulmonary exam normal breath sounds clear to auscultation       Cardiovascular hypertension, Pt. on medications  Rhythm:Regular Rate:Normal     Neuro/Psych Depression CVA    GI/Hepatic negative GI ROS, (+)     substance abuse  ,   Endo/Other  negative endocrine ROS  Renal/GU negative Renal ROS  negative genitourinary   Musculoskeletal  (+) Arthritis , Rheumatoid disorders,    Abdominal   Peds  Hematology negative hematology ROS (+)   Anesthesia Other Findings   Reproductive/Obstetrics negative OB ROS                            Anesthesia Physical Anesthesia Plan  ASA: IV  Anesthesia Plan: General   Post-op Pain Management:    Induction: Intravenous  PONV Risk Score and Plan: 2 and Ondansetron, Dexamethasone and Midazolam  Airway Management Planned: Oral ETT  Additional Equipment:   Intra-op Plan:   Post-operative Plan: Post-operative intubation/ventilation  Informed Consent: I have reviewed the patients History and Physical, chart, labs and discussed the procedure including the risks, benefits and alternatives for the proposed anesthesia with the patient or authorized representative who has indicated his/her understanding and acceptance.     Dental advisory given  Plan Discussed with: CRNA  Anesthesia Plan Comments:        Anesthesia Quick Evaluation

## 2019-11-03 NOTE — Progress Notes (Signed)
HEMATOLOGY-ONCOLOGY PROGRESS NOTE  SUBJECTIVE: Events this past weekend have been noted.  He has now been transferred to [facility] and is in the ICU.  Intubated.  Sedated.  REVIEW OF SYSTEMS:   Unable to obtain secondary to sedation.  I have reviewed the past medical history, past surgical history, social history and family history with the patient and they are unchanged from previous note.   PHYSICAL EXAMINATION: ECOG PERFORMANCE STATUS: 4 - Bedbound  Vitals:   11/09/2019 1445 11/11/2019 1500  BP: (!) 74/55 91/67  Pulse: (!) 102 (!) 29  Resp: 13 18  Temp:    SpO2: 94% 100%   Filed Weights   10/30/19 0700 11/01/19 1941 11/02/19 0000  Weight: 68.5 kg 62.5 kg 62 kg    Intake/Output from previous day: 03/14 0701 - 03/15 0700 In: 3160.8 [I.V.:2460; Blood:330; IV Piggyback:370.8] Out: 1705 [Urine:1705]  General: Chronically ill-appearing, intubated, sedated HEENT: ET tube in place, dried blood noted in the oropharynx Respiratory: Scattered wheezes present.  Cardiovascular: Tachycardic, no edema GI:  Soft, nontender  Skin: Multiple bruises noted on his upper extremities, no petechiae Neuro: Sedated.  LABORATORY DATA:  I have reviewed the data as listed CMP Latest Ref Rng & Units 10/23/2019 11/02/2019 11/01/2019  Glucose 70 - 99 mg/dL 152(H) - 195(H)  BUN 8 - 23 mg/dL 29(H) - 25(H)  Creatinine 0.61 - 1.24 mg/dL 0.61 - 0.64  Sodium 135 - 145 mmol/L 145 139 138  Potassium 3.5 - 5.1 mmol/L 4.0 3.8 3.7  Chloride 98 - 111 mmol/L 108 - 101  CO2 22 - 32 mmol/L 27 - 28  Calcium 8.9 - 10.3 mg/dL 7.3(L) - 7.4(L)  Total Protein 6.5 - 8.1 g/dL - - 4.7(L)  Total Bilirubin 0.3 - 1.2 mg/dL - - 1.2  Alkaline Phos 38 - 126 U/L - - 314(H)  AST 15 - 41 U/L - - 73(H)  ALT 0 - 44 U/L - - 194(H)    Lab Results  Component Value Date   WBC 7.4 11/02/2019   HGB 7.8 (L) 10/20/2019   HCT 23.6 (L) 10/24/2019   MCV 96.7 11/02/2019   PLT 129 (L) 11/02/2019   NEUTROABS 10.4 (H)  11/01/2019    EEG  Result Date: 10/17/2019 Lora Havens, MD     10/17/2019  1:38 PM Patient Name: Drew Nguyen MRN: [id number] Epilepsy Attending: Lora Havens Referring Physician/Provider: Dr. Gean Birchwood Date: 10/17/2019 Duration: 25.26 minutes Patient history: 64 year old male with history of alcohol use, cocaine use who presented with generalized tonic-clonic seizure-like episode.  EEG to evaluate for seizures. Level of alertness: Awake AEDs during EEG study: Ativan Technical aspects: This EEG study was done with scalp electrodes positioned according to the 10-20 International system of electrode placement. Electrical activity was acquired at a sampling rate of 500Hz  and reviewed with a high frequency filter of 70Hz  and a low frequency filter of 1Hz . EEG data were recorded continuously and digitally stored. Description: The posterior dominant rhythm consists of 9-10 Hz activity of moderate voltage (25-35 uV) seen predominantly in posterior head regions, symmetric and reactive to eye opening and eye closing.  There is also 15 to 18 Hz beta activity  distributed symmetrically and diffusely.  Physiologic photic driving was seen during photic stimulation.  Hyperventilation was not performed. IMPRESSION: This study is within normal limits. No seizures or epileptiform discharges were seen throughout the recording. Lora Havens   DG Chest 2 View  Result Date: 10/25/2019 CLINICAL DATA:  Cough.  Right-sided chest pain. Shortness of breath. EXAM: CHEST - 2 VIEW COMPARISON:  Radiograph CT 01/08/2020. Chest CT 10/17/2019 FINDINGS: Progressive volume loss in the left hemithorax with progressive opacification throughout, patient with known left hilar mass. This is likely increasing postobstructive atelectasis throughout the left lung. Difficult to exclude an element of pleural effusion. Right lung is slightly hyperinflated but clear. Cardiomediastinal contours are obscured by left lung opacity. No  evidence of right pulmonary edema. No visualized pneumothorax. IMPRESSION: Progressive volume loss in the left hemithorax with increased opacification, likely increasing postobstructive atelectasis throughout the left lung given history of left hilar mass and upper lobe collapse on recent CT. Difficult to exclude an element of pleural fluid on the left. Electronically Signed   By: Keith Rake M.D.   On: 10/25/2019 19:26   CT HEAD WO CONTRAST  Result Date: 10/25/2019 CLINICAL DATA:  Slurred speech, history of CVAs, neuro deficit, acute, stroke suspected. EXAM: CT HEAD WITHOUT CONTRAST TECHNIQUE: Contiguous axial images were obtained from the base of the skull through the vertex without intravenous contrast. COMPARISON:  Brain MRI 10/17/2019, head CT 10/08/2019. FINDINGS: Brain: There is no evidence of acute intracranial hemorrhage, intracranial mass, midline shift or extra-axial fluid collection.No demarcated cortical infarction. Redemonstrated chronic lacunar infarcts within the bilateral corona radiata/basal ganglia. Background ill-defined hypoattenuation within the cerebral white matter is nonspecific, but consistent with chronic small vessel ischemic disease. Stable, mild generalized parenchymal atrophy. Vascular: No hyperdense vessel.  Atherosclerotic calcifications. Skull: Normal. Negative for fracture or focal lesion. Sinuses/Orbits: Visualized orbits demonstrate no acute abnormality. No significant paranasal sinus disease or mastoid effusion at the imaged levels. IMPRESSION: No evidence of acute intracranial abnormality. Redemonstrated chronic bilateral corona radiata/basal ganglia lacunar infarcts. Stable background generalized parenchymal atrophy and chronic small vessel ischemic disease. Electronically Signed   By: Kellie Simmering DO   On: 10/25/2019 18:33   CT Head Wo Contrast  Result Date: 10/05/2019 CLINICAL DATA:  Altered level of consciousness, seizure EXAM: CT HEAD WITHOUT CONTRAST  TECHNIQUE: Contiguous axial images were obtained from the base of the skull through the vertex without intravenous contrast. COMPARISON:  06/05/2019, 06/06/2019 FINDINGS: Brain: There are chronic small vessel ischemic changes seen within the bilateral basal ganglia and periventricular white matter. No acute infarct or hemorrhage. Lateral ventricles and remaining midline structures are unremarkable. No acute extra-axial fluid collections. No mass effect. Vascular: No hyperdense vessel or unexpected calcification. Skull: Normal. Negative for fracture or focal lesion. Sinuses/Orbits: No acute finding. Other: None IMPRESSION: 1. Chronic small vessel ischemic changes, no acute intracranial process. Electronically Signed   By: Randa Ngo M.D.   On: 10/15/2019 22:54   CT CHEST W CONTRAST  Result Date: 10/17/2019 CLINICAL DATA:  Abnormal chest x-ray, pulmonary nodule, seizure, altered level of consciousness EXAM: CT CHEST WITH CONTRAST TECHNIQUE: Multidetector CT imaging of the chest was performed during intravenous contrast administration. CONTRAST:  51mL OMNIPAQUE IOHEXOL 300 MG/ML  SOLN COMPARISON:  10/13/2019, 03/04/2019 FINDINGS: Cardiovascular: The heart is unremarkable without pericardial effusion. Thoracic aorta is normal in caliber without aneurysm or dissection. No filling defects or pulmonary emboli. Extrinsic compression upon the left pulmonary artery due to left hilar mass. Moderate atherosclerosis of the coronary vasculature, with mild calcified plaque of the aortic arch. Mediastinum/Nodes: There is a lobular soft tissue mass in the mediastinum and AP window, measuring 4.7 x 5.3 cm. Large left hilar mass with obstruction of the left upper lobe bronchus. No contralateral hilar adenopathy. There is circumferential wall thickening of the distal thoracic  esophagus, nonspecific. Endoscopy may be useful. Lungs/Pleura: There is complete collapse of the left upper lobe due to a large left hilar mass causing  abrupt cut off of the left upper lobe bronchus. Distinguishing the left hilar mass from the consolidated left upper lobe is difficult. PET scan recommended for further evaluation. Background emphysema. Minimal tree in bud ground-glass airspace disease within the left lower lobe likely inflammatory or infectious. There is narrowing of the left lower lobe bronchus with mucoid material filling the posterior basilar segmental bronchi. No effusion or pneumothorax. Upper Abdomen: Thickening of the left adrenal gland measuring 12 mm, suspicious for metastases. Remainder of the upper abdomen is unremarkable. Musculoskeletal: No acute or destructive bony lesions. Reconstructed images demonstrate no additional findings. IMPRESSION: 1. 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 This mass results in extrinsic compression of the left upper lobe pulmonary artery, as well as complete obstruction of the left upper lobe bronchus and complete left upper lobe atelectasis. 2. Circumferential mural thickening of the distal thoracic esophagus. Endoscopy recommended for further evaluation. 3. Minimal left lower lobe tree-in-bud ground-glass airspace disease consistent with inflammation or infection. 4. Left adrenal thickening, nonspecific. Metastatic disease not excluded. 5.  Emphysema (ICD10-J43.9). Electronically Signed   By: Randa Ngo M.D.   On: 10/17/2019 02:01   MR BRAIN W WO CONTRAST  Result Date: 10/17/2019 CLINICAL DATA:  Seizure. Abnormal neuro exam. Hypertension. Patient stopped drinking daily alcohol 1 week ago. Confusion. EXAM: MRI HEAD WITHOUT AND WITH CONTRAST TECHNIQUE: Multiplanar, multiecho pulse sequences of the brain and surrounding structures were obtained without and with intravenous contrast. CONTRAST:  8mL GADAVIST GADOBUTROL 1 MMOL/ML IV SOLN COMPARISON:  CT head without contrast 09/29/2019. MRI of the head 04/19/2019 FINDINGS: Brain: The  diffusion-weighted images demonstrate no acute or subacute infarct. Remote lacunar infarcts are present within the basal ganglia extending superiorly into the corona radiata bilaterally. Mild atrophy and white matter disease is present bilaterally, otherwise stable. The basal ganglia are otherwise unremarkable. Insular ribbon is within limits bilaterally. Study is mildly degraded by patient motion. Temporal lobes are normal. The internal auditory canals are normal bilaterally. The brainstem and cerebellum are normal. Postcontrast images demonstrate no pathologic enhancement. Vascular: Flow is present in the major intracranial arteries. Skull and upper cervical spine: The craniocervical junction is normal. Upper cervical spine is within normal limits. Marrow signal is unremarkable. Sinuses/Orbits: The paranasal sinuses and mastoid air cells are clear. The globes and orbits are within normal limits. IMPRESSION: 1. Remote nonhemorrhagic lacunar infarcts of the basal ganglia bilaterally extending into the corona radiata. 2. Atrophy and white matter disease are mildly advanced for age. 3. No acute or focal intracranial abnormality to explain seizures or confusion. Electronically Signed   By: San Morelle M.D.   On: 10/17/2019 05:30   CT ABDOMEN PELVIS W CONTRAST  Result Date: 10/22/2019 CLINICAL DATA:  64 year old male with history of adrenal mass suspicious for malignancy. Follow-up study. EXAM: CT ABDOMEN AND PELVIS WITH CONTRAST TECHNIQUE: Multidetector CT imaging of the abdomen and pelvis was performed using the standard protocol following bolus administration of intravenous contrast. CONTRAST:  162mL OMNIPAQUE IOHEXOL 300 MG/ML  SOLN COMPARISON:  No priors. FINDINGS: Lower chest: Atherosclerotic calcifications in the left circumflex and right coronary arteries. Hepatobiliary: Multiple ill-defined hypovascular lesions are scattered throughout the hepatic parenchyma, highly concerning for widespread  metastatic disease. One specific example is the lesion at the junction of segment 8 and the caudate lobe (axial image 19  of series 3) measuring 3.2 x 2.1 cm. No intra or extrahepatic biliary ductal dilatation. Gallbladder is nearly completely collapsed with avid enhancement of the gallbladder mucosa and diffuse wall edema, without surrounding inflammatory changes. Pancreas: No pancreatic mass. No pancreatic ductal dilatation. No pancreatic or peripancreatic fluid collections or inflammatory changes. Spleen: Unremarkable. Adrenals/Urinary Tract: Subcentimeter low-attenuation lesions in both kidneys, too small to characterize, but statistically likely to represent cysts. No hydroureteronephrosis. Urinary bladder is normal in appearance. Right adrenal gland is normal. 1.2 cm left adrenal nodule (axial image 23 of series 3), similar to the prior chest CT 10/06/2019. Stomach/Bowel: Normal appearance of the stomach. No pathologic dilatation of small bowel or colon. Numerous colonic diverticulae are noted, without surrounding inflammatory changes to suggest an acute diverticulitis at this time. Normal appendix. Vascular/Lymphatic: Aortic atherosclerosis, without evidence of aneurysm or dissection in the abdominal or pelvic vasculature. No lymphadenopathy noted in the abdomen or pelvis. Reproductive: Prostate gland and seminal vesicles are unremarkable in appearance. Other: No significant volume of ascites.  No pneumoperitoneum. Musculoskeletal: There are no aggressive appearing lytic or blastic lesions noted in the visualized portions of the skeleton. IMPRESSION: 1. Small left adrenal nodule is nonspecific. This could either be a benign or malignant lesion. 2. Importantly, there are innumerable hypovascular areas in the liver which are highly suspicious for metastatic disease. This could be better evaluated with follow-up abdominal MRI with and without IV gadolinium. 3. Colonic diverticulosis without evidence of acute  diverticulitis at this time. 4. Additional incidental findings, as above. Electronically Signed   By: Vinnie Langton M.D.   On: 10/22/2019 16:40   MR LIVER W WO CONTRAST  Result Date: 10/24/2019 CLINICAL DATA:  Indeterminate liver lesions on recent CT. Recently diagnosed lung carcinoma. EXAM: MRI ABDOMEN WITHOUT AND WITH CONTRAST TECHNIQUE: Multiplanar multisequence MR imaging of the abdomen was performed both before and after the administration of intravenous contrast. CONTRAST:  22mL GADAVIST GADOBUTROL 1 MMOL/ML IV SOLN COMPARISON:  CT on 10/22/2019 FINDINGS: Lower chest: No acute findings. Hepatobiliary: Innumerable small hypovascular masses are seen throughout the liver, consistent with diffuse liver metastases. Gallbladder is nearly completely collapsed. No evidence of biliary ductal dilatation. Pancreas:  No mass or inflammatory changes. Spleen:  Within normal limits in size and appearance. Adrenals/Urinary Tract: No masses identified. Small bilateral renal cysts noted. No evidence of hydronephrosis. Stomach/Bowel: Visualized portion unremarkable. Vascular/Lymphatic: Mild lymphadenopathy in porta hepatis measuring 1.9 cm short axis. No abdominal aortic aneurysm. Other:  None. Musculoskeletal: Diffuse enhancing T2 hyperintense bone lesions throughout the visualized portion of the thoracolumbar spine and pelvis, consistent with diffuse bone metastases. IMPRESSION: Diffuse liver metastases. Diffuse bone metastases. Mild abdominal lymphadenopathy in porta hepatis, consistent with metastatic disease. Electronically Signed   By: Marlaine Hind M.D.   On: 10/24/2019 19:23   DG CHEST PORT 1 VIEW  Result Date: 10/28/2019 CLINICAL DATA:  Endotracheal tube placement. EXAM: PORTABLE CHEST 1 VIEW COMPARISON:  November 02, 2019. FINDINGS: Stable cardiomegaly. Endotracheal tube tip appears to be at the carina; withdrawal by 2-3 cm is recommended. Patient is rotated to the left. Right-sided PICC line is unchanged in  position with tip in expected position of cavoatrial junction. No pneumothorax is noted. Left basilar atelectasis or infiltrate is noted with associated effusion. Bony thorax is unremarkable. IMPRESSION: Endotracheal tube tip appears to be at the carina; withdrawal by 2-3 cm is recommended. Left basilar atelectasis or infiltrate is noted with associated pleural effusion. These results will be called to the ordering clinician or representative  by the Radiologist Assistant, and communication documented in the PACS or zVision Dashboard. Electronically Signed   By: Marijo Conception M.D.   On: 11/06/2019 09:27   DG Chest Port 1 View  Result Date: 11/02/2019 CLINICAL DATA:  Hypoxia EXAM: PORTABLE CHEST 1 VIEW COMPARISON:  11/02/2019 at 12:27 a.m. FINDINGS: Markedly rotated to the left. Endotracheal tube tip is at the level of the clavicular heads, slightly retracted from the prior study. Tip of the right approach PICC line is in the lower SVC. Right lung is clear. There is a intermediate sized left pleural effusion with left basilar consolidation/atelectasis. IMPRESSION: 1. Intermediate sized left pleural effusion with left basilar consolidation/atelectasis. 2. Endotracheal tube tip at the level of the clavicular heads. Electronically Signed   By: Ulyses Jarred M.D.   On: 11/02/2019 03:48   DG CHEST PORT 1 VIEW  Result Date: 11/02/2019 CLINICAL DATA:  64 year old male status post intubation. EXAM: PORTABLE CHEST 1 VIEW COMPARISON:  Chest radiograph dated 11/01/2019. FINDINGS: Interval placement of an endotracheal tube with tip approximately 3 cm above the carina. Right-sided PICC in similar position. The patient is rotated. Left lung base density as seen previously. Slight interval increase in the vascular markings of the left lung. No pneumothorax. Degenerative changes of the spine. No acute osseous pathology. IMPRESSION: 1. Interval placement of an endotracheal tube with tip above the carina. 2. Left lung base  density similar to prior. Electronically Signed   By: Anner Crete M.D.   On: 11/02/2019 00:50   DG Chest Port 1 View  Result Date: 11/01/2019 CLINICAL DATA:  Hemoptysis. EXAM: PORTABLE CHEST 1 VIEW COMPARISON:  October 25, 2019 FINDINGS: Mild-to-moderate severity atelectasis and/or infiltrate is seen within the left lung base. This is decreased in severity when compared to the prior study. There is no evidence of a pleural effusion or pneumothorax. The heart size and mediastinal contours are within normal limits. Mild-to-moderate severity multilevel degenerative changes seen throughout the thoracic spine. IMPRESSION: Mild-to-moderate severity left basilar atelectasis and/or infiltrate. Electronically Signed   By: Virgina Norfolk M.D.   On: 11/01/2019 19:42   DG Chest Portable 1 View  Result Date: 10/09/2019 CLINICAL DATA:  Seizure, altered mental status EXAM: PORTABLE CHEST 1 VIEW COMPARISON:  06/06/2019 FINDINGS: Right lung is grossly clear. Asymmetric hazy opacification in the left thorax with increased left hilar density. Heart size upper limits of normal. No pneumothorax. IMPRESSION: Asymmetric hazy opacity in left thorax with asymmetric density in the left hilus, could be due to diffuse lung parenchymal air disease versus lung mass. Chest CT is recommended for further evaluation. Electronically Signed   By: Donavan Foil M.D.   On: 09/25/2019 22:42   CT ANGIO CHEST AORTA W/CM & OR WO/CM  Addendum Date: 11/02/2019   ADDENDUM REPORT: 11/02/2019 05:21 ADDENDUM: Case discussed with Dr. Deatra Canter at 5:15. Patient has active massive hemoptysis. The debris within the trachea and mainstem bronchi corresponds to hemorrhage. Some of the patchy opacities in the lungs bilaterally may reflect hemorrhage rather than infection, particularly in the superior segment left lower lobe (for example, series 8/image 31). However, when closely evaluating this region, it still appears to have a pulmonary arterial/venous  supply. An aberrant bronchial artery source is not definitely identified. Prior study also noted narrowing of a left upper lobe pulmonary artery anteriorly. This is improved on the current study, presumably following chemotherapy. Electronically Signed   By: Julian Hy M.D.   On: 11/02/2019 05:21   Result Date: 11/02/2019 CLINICAL  DATA:  Hemoptysis EXAM: CT ANGIOGRAPHY CHEST WITH CONTRAST TECHNIQUE: Multidetector CT imaging of the chest was performed using the standard protocol during bolus administration of intravenous contrast. Multiplanar CT image reconstructions and MIPs were obtained to evaluate the vascular anatomy. CONTRAST:  145mL OMNIPAQUE IOHEXOL 350 MG/ML SOLN COMPARISON:  Chest radiograph dated 11/02/2019. CT chest dated 10/17/2019. FINDINGS: Cardiovascular: Satisfactory opacification the pulmonary arteries to the segmental level. No evidence of pulmonary embolism. Although not tailored for evaluation of the thoracic aorta, there is no evidence of thoracic aortic aneurysm or dissection. Mild atherosclerotic calcifications of the aortic arch. The heart is top-normal in size. Small pericardial effusion inferiorly. Leftward cardiomediastinal shift. Mediastinum/Nodes: 1.9 cm short axis AP window node, previously 2.5 cm. Additional tumor in the left hilar region, indistinguishable from the patient's known left upper lobe mass (described below). Lungs/Pleura: Endotracheal tube terminates 2 cm above the carina. Debris in the distal trachea (series 8/image 46) extending into the right mainstem bronchus (series 8/image 58) as well as the left mainstem bronchus and left lower lobe airway (series 8/image 56). Central left upper lobe mass extending to the left hilar region, difficult to discretely measure, but likely at least 2.5 x 4.0 cm (series 6/image 58). This may have measures 3.4 x 5.5 cm on the prior CT. Surrounding postobstructive opacity/atelectasis in the left upper lobe. Left lower lobe  atelectasis/collapse, with additional patchy/nodular opacities in the left upper and lower lobes, suggesting pneumonia. Additional patchy opacity in the posterior right lower lobe (series 8/image 112), suggesting atelectasis, however there are patchy/nodular opacities throughout the right lung which suggests multifocal pneumonia. Mild centrilobular and paraseptal emphysematous changes. Volume loss in the left hemithorax. Small left pleural effusion. No pneumothorax. Upper Abdomen: Mild thickening of the left adrenal gland without discrete mass. Although poorly visualized, there are innumerable hepatic metastases, measuring up to 2.5 cm in the central liver (series 6/image 146). These are better evaluated on recent MRI. Musculoskeletal: Degenerative changes of the visualized thoracolumbar spine. Mild superior endplate changes at L1. Review of the MIP images confirms the above findings. IMPRESSION: No evidence of pulmonary embolism. Left upper lobe mass extending into the left hilar region, possibly mildly decreased from prior CT. AP window nodal metastasis, mildly decreased. Multifocal pneumonia. Postobstructive opacity in the left upper lobe. Dependent atelectasis in the bilateral lower lobes, left greater than right. Small left pleural effusion. Endotracheal tube terminates 2 cm above the carina. Debris in the lower trachea and bilateral mainstem bronchi, as above. Numerous hepatic metastases, better evaluated on recent MRI. Aortic Atherosclerosis (ICD10-I70.0) and Emphysema (ICD10-J43.9). Electronically Signed: By: Julian Hy M.D. On: 11/02/2019 04:44   [place] EKG SITE RITE  Result Date: 10/28/2019 If Site Rite image not attached, placement could not be confirmed due to current cardiac rhythm.   ASSESSMENT AND PLAN: Metastatic small cell lung cancer -Left hilar mass with mediastinal lymph hilar adenopathy, extrinsic compression of the left upper lobe pulmonary artery and complete obstruction of the left  upper lobe bronchus noted on CT scan of the chest on 10/17/2019 -MRI of the brain did not show evidence of metastases on 10/17/2019 -Status post EBUS on 11/09/2019 -pathology consistent with small cell lung cancer -CT of the abdomen pelvis on 10/20/2019 with a nonspecific left adrenal nodule and innumerable lesions in the liver suspicious for metastatic disease -I have again discussed the diagnosis with the patient who seems to have limited understanding of this conversation.  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  Treatment for  this is he would not be curative but will be given with palliative intent. -His performance status is marginal, ECOG performance status of 3 at best -Additionally, he continues to be encephalopathic which could represent a paraneoplastic process. -He has been started on palliative radiation. -In prior discussions with his brother, [name], he understands that his cancer is not curable.    -The patient's brother has expressed that he understands that this is not a curable disease but would like to attempt aggressive treatment including chemotherapy, but also expressed that he wants to keep Gaddiel comfortable.  -After lengthy discussion with the patient and his brother (healthcare POA), the decision was made to proceed with palliative chemotherapy -The dose of chemotherapy has been reduced to avoid excessive toxicity ? Carboplatin AUC 4 on Day 1, etoposide 60mg /m2 on Days 1-2 ? Granix 321mcg, starting on Day 4 (11/01/2019), x 7 days  -If he tolerates the first cycle of chemotherapy relatively well, then we can consider increasing the dose of chemotherapy with subsequent treatment -However, if he tolerates the treatment poorly, then I would advise reassessing the goals of care and considering palliative care/hospice -Palliative radiation to the left lung mass is currently on hold -CBC reviewed and WBC remains normal, hemoglobin 7.8 today which is stable over the  past 24 hours, and platelets are mildly decreased. -Continue Granix 300 mcg subcu daily x7 days starting 11/01/2019.  -Transfuse PRBCs per ICU parameters -Transfuse platelets for platelet count less than 20,000. -We will attempt to reach out to his brother there today or early tomorrow morning to provide an update with regards to his lung cancer.  Massive hemoptysis -Appreciate assistance from PCCM.  -Status post bronchoscopy earlier today and oozing endobronchial tumor from both the left upper and left lower lobes treated partially with cryotherapy but he remains at high risk for rebleeding.  Generalized tonic-clonic seizures/encephalopathy -He has not had any recurrent seizures and mental status slowly improving -He has been started on lactulose for persistently elevated ammonia level -Neurology has previously evaluated and has now signed off  Elevated LFTs -LFTs trending downward as of 11/01/2019 -Likely due to hepatic metastases  Hypertension and history of CVA -Continue aggressive management of his hypertension -Aspirin and Plavix stopped secondary to hemoptysis  Rheumatoid arthritis -The patient has been maintained on high-dose prednisone for many years -He has been on Plaquenil and Enbrel in the past which are now on hold -He is currently on prednisone 20 mg daily  History of alcohol and cocaine use -The patient reports that he is not drinking all alcohol and urine drug screen on admission was negative  Tobacco dependence -Continue to encourage smoking cessation    LOS: 17 days   Mikey Bussing, DNP, AGPCNP-BC, AOCNP 11/14/2019

## 2019-11-03 NOTE — Progress Notes (Addendum)
NAME:  Drew Nguyen, MRN:  [id number], DOB:  19-Apr-1956, LOS: 17 ADMISSION DATE:  10/15/2019, CONSULTATION DATE:  10/17/19 REFERRING MD:  [place], CHIEF COMPLAINT:  Lung mass    Brief History   64 yo man admitted with encephalopathy found to have left upper lobe lung mass.  Initial PCCM consult was 2/26 for lung mass.  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  Encephalopathy - suspect paraneoplastic process from [place] but LP was never done.  PCCM + after diagnosis.  Has been getting inpatient chemotherapy and radiation at [facility]. Had massive hemoptysis on the floor at [facility] and transferred back to [facility] hospital 3/13 evening.  PCCM reconsulted for hemoptysis respiratory failure.  Consults:  CCM, Oncology  Procedures:  Bronchoscopy with EBUS 3/2 Intubation 3/13 Bronchoscopy 3/13 Bronchoscopy 3/14  Significant Diagnostic Tests:  CT angio chest 3/14 The debris within the trachea and mainstem bronchi corresponds to hemorrhage. Some of the patchy opacities in the lungs bilaterally may reflect hemorrhage rather than infection, particularly in the superior segment left lower lobe (for example, series 8/image 31). However, when closely evaluating this region, it still appears to have a pulmonary arterial/venous supply. An aberrant bronchial artery source is not definitely identified.  Prior study also noted narrowing of a left upper lobe pulmonary artery anteriorly. This is improved on the current study, presumably following chemotherapy.   Micro Data:  3/13 MRSA PCR negative 3/13 Covid negative  Antimicrobials:  Started on empiric HCAP coverage vancomycin and cefepime on 3/14  Interim history/subjective:  No overt hemoptysis overnight.  For scheduled clot evacuation today.  ET tube upsized to 9 mm.  Repositioned due to desaturation: Tube projecting into mass on bronchoscopy.  Sedation restarted for agitation this morning.  Objective    Blood pressure 122/75, pulse 65, temperature 97.7 F (36.5 C), temperature source Oral, resp. rate 20, height 5\' 6"  (1.676 m), weight 62 kg, SpO2 100 %.    Vent Mode: PRVC FiO2 (%):  [50 %-80 %] 50 % Set Rate:  [18 bmp-20 bmp] 18 bmp Vt Set:  [510 mL] 510 mL PEEP:  [14 cmH20] 14 cmH20 Plateau Pressure:  [29 cmH20-36 cmH20] 29 cmH20   Intake/Output Summary (Last 24 hours) at 11/06/2019 0903 Last data filed at 10/30/2019 0720 Gross per 24 hour  Intake 2570.99 ml  Output 1640 ml  Net 930.99 ml   Filed Weights   10/30/19 0700 11/01/19 1941 11/02/19 0000  Weight: 68.5 kg 62.5 kg 62 kg    Examination: General: Intubated, sedated.  Appears chronically ill. HENT: Endotracheal tube to vent, dried blood blood in oropharynx Lungs: Saccular breath sounds throughout with diffuse wheezing. Cardiovascular: Regular rate and rhythm, extremities warm.  No JVD. Abdomen: Soft, nontender Extremities: No edema Neuro: Sedated, but moves all limbs to stimulation. MSK: Diffuse ecchymoses.  Skin fragility.  Moderate muscle wasting. Lines: PICC line   Assessment & Plan:    Critically ill due to acute hypoxic hypercapnic respiratory failure requiring mechanical ventilation secondary to massive hemoptysis. -Continue full ventilatory support through procedure. -May require radiation and chemotherapy tumor debulking prior to successful extubation.  Massive hemoptysis secondary to metastatic small cell lung cancer. New diagnosis, currently on inpatient chemotherapy and radiation -Suspect that he is bleeding from endobronchial lesions.   - Bronchoscopy with cryotherapy in an attempt to suction the clot in his trachea on Monday 3/15 in the OR.   - Continue TXA nebulizers.  Holding Plavix and aspirin. 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  Predates critical illness.  Ammonia only mildly elevated. -History of CVA on Plavix-currently on  hold. -We will stop lactulose as ammonia only mildly elevated and no diagnosis of cirrhosis. -Would consider paraneoplastic syndrome as I suggested earlier this admission.  Likely secondary to small cell lung cancer.  Rheumatoid arthritis -On prednisone 20 mg.  Holding Plaquenil and Enbrel   COPD with ongoing tobacco use disorder -Restart bronchodilators for wheezing. -On chronic steroids for rheumatoid arthritis.   Daily Goals Checklist  Pain/Anxiety/Delirium protocol (if indicated): Continue fentanyl and propofol through bronchoscopy and wean thereafter. VAP protocol (if indicated): Bundle in place Respiratory support goals: Full ventilatory support Blood pressure target: On no vasopressors.  Keep MAP greater than 65 DVT prophylaxis: SCDs only due to active bleeding Nutrition: High nutritional risk.  Initiate tube feeding GI prophylaxis: Pantoprazole Fluid status goals: Clinically euvolemic.  Allow autoregulation Urinary catheter: Guide hemodynamic management Central lines: Right antecubital single-lumen PICC. Glucose control: No history of diabetes.  Currently euglycemic on no therapy.  Monitor for increase blood sugar with feeds. Mobility/therapy needs: Bedrest for now. Antibiotic de-escalation: Cefepime for postobstructive pneumonia. Home medication reconciliation: Reviewed.  Home antirheumatic agents on hold. Daily labs: CBC CMP Code Status: Full code.  We will need to readdress goals of care given the poor long-term prognosis. Family Communication: We will update brother following bronchoscopy. Disposition: ICU   Labs   CBC: Recent Labs  Lab 11/01/19 1907 11/01/19 1907 11/01/19 2347 11/01/19 2347 11/02/19 0508 11/02/19 0531 11/02/19 1058 11/02/19 1700 11/10/2019 0528  WBC 12.8*   < > 16.2*  --   --  20.2* 10.0 8.9 7.4  NEUTROABS 10.4*  --   --   --   --   --   --   --   --   HGB 9.5*   < > 8.7*   < > 6.8* 7.2* 8.3* 7.9* 7.8*  HCT 28.3*   < > 26.5*   < > 20.0*  22.4* 25.1* 24.3* 23.6*  MCV 96.6   < > 96.0  --   --  97.0 95.1 95.7 96.7  PLT 158   < > 172  --   --  189 129* 124* 129*   < > = values in this interval not displayed.    Basic Metabolic Panel: Recent Labs  Lab 10/29/19 0530 10/29/19 0530 10/30/19 0443 10/30/19 0443 10/31/19 0500 11/01/19 5456 11/01/19 2347 11/02/19 0508 10/22/2019 0528  NA 139   < > 140   < > 137 138 138 139 145  K 3.0*   < > 2.7*   < > 3.7 2.6* 3.7 3.8 4.0  CL 103   < > 103  --  102 100 101  --  108  CO2 25   < > 28  --  27 28 28   --  27  GLUCOSE 164*   < > 186*  --  203* 253* 195*  --  152*  BUN 26*   < > 23  --  21 18 25*  --  29*  CREATININE 0.71   < > 0.56*  --  0.60* 0.52* 0.64  --  0.61  CALCIUM 7.9*   < > 8.2*  --  8.0* 7.6* 7.4*  --  7.3*  MG 1.9  --   --   --  1.8 1.8 2.2  --  2.2  PHOS  --   --   --   --  2.8 2.8  --   --  3.1   < > = values in this interval not displayed.   GFR: Estimated Creatinine Clearance: 81.8 mL/min (by C-G formula based on SCr of 0.61 mg/dL). Recent Labs  Lab 11/02/19 0531 11/02/19 1058 11/02/19 1700 11/09/2019 0528  WBC 20.2* 10.0 8.9 7.4    Liver Function Tests: Recent Labs  Lab 10/28/19 0608 10/29/19 0530 10/30/19 0443 10/31/19 0500 11/01/19 2347  AST 155* 144* 141* 101* 73*  ALT 349* 311* 338* 266* 194*  ALKPHOS 413* 412* 456* 337* 314*  BILITOT 1.2 1.3* 1.4* 0.8 1.2  PROT 5.8* 5.4* 5.7* 5.1* 4.7*  ALBUMIN 3.1* 2.7* 2.8* 2.3* 2.1*   No results for input(s): LIPASE, AMYLASE in the last 168 hours. No results for input(s): AMMONIA in the last 168 hours.  ABG    Component Value Date/Time   PHART 7.410 11/02/2019 0508   PCO2ART 46.2 11/02/2019 0508   PO2ART 68.0 (L) 11/02/2019 0508   HCO3 29.3 (H) 11/02/2019 0508   TCO2 31 11/02/2019 0508   O2SAT 93.0 11/02/2019 0508     Coagulation Profile: Recent Labs  Lab 11/18/2019 0528  INR 1.0    Cardiac Enzymes: No results for input(s): CKTOTAL, CKMB, CKMBINDEX, TROPONINI in the last 168  hours.  HbA1C: Hgb A1c MFr Bld  Date/Time Value Ref Range Status  06/06/2019 03:27 AM 5.4 4.8 - 5.6 % Final    Comment:    (NOTE) Pre diabetes:          5.7%-6.4% Diabetes:              >6.4% Glycemic control for   <7.0% adults with diabetes   04/20/2019 05:32 AM 5.1 4.8 - 5.6 % Final    Comment:    (NOTE) Pre diabetes:          5.7%-6.4% Diabetes:              >6.4% Glycemic control for   <7.0% adults with diabetes     CBG: Recent Labs  Lab 11/02/19 1111 11/02/19 1509 11/02/19 1921 11/02/19 2320 10/24/2019 0316  GLUCAP 170* 156* 175* 167* 163*    CRITICAL CARE Performed by: Kipp Brood   Total critical care time: 40 minutes  Critical care time was exclusive of separately billable procedures and treating other patients.  Critical care was necessary to treat or prevent imminent or life-threatening deterioration.  Critical care was time spent personally by me on the following activities: development of treatment plan with patient and/or surrogate as well as nursing, discussions with consultants, evaluation of patient's response to treatment, examination of patient, obtaining history from patient or surrogate, ordering and performing treatments and interventions, ordering and review of laboratory studies, ordering and review of radiographic studies, pulse oximetry, re-evaluation of patient's condition and participation in multidisciplinary rounds.  Kipp Brood, MD [facility] ICU Physician [place]  Pager: [phone] Mobile: [phone] After hours: [phone].

## 2019-11-03 NOTE — Op Note (Signed)
Bronchoscopy  Indication: Inspissated clot in airways  Consent: Signed in chart  Anesthesia: General anesthesia  Procedure - Timeout performed - Bronchoscope advanced through ETT - Airways examined down to subsegmental level - Following airway examination, therapeutic suctioning of trachea and right mainstem performed - Then attention turned to left mainstem, thick clot removed down to secondary carina - At secondary carina, left lower lobe occluded completely by combination friable tumor and blood clot, unable to debulk here due to significant bleeding - Left upper lobe had extrinsic compression with minor opening noted for apicoposterior subsegment, lingula completely occluded  Findings - Bilateral mainstem occluded by inspissated clot successfully removed with cryoprobe - Oozing endobronchial tumor from both left upper and left lower lobes able to treat partially with cryotherapy but high risk for rebleeding - BAL performed in RLL, return was bloody nonpurulent  Recommendations - Discussed case with Dr. Lynetta Mare and oncology: approach family regarding goals of care and if all aggressive measures desired, may need transfer to another center for consideration of targeted angiography and potential endovascular ablation.  APC could be attempted here but did not see any good targets, mucosa diffusely friable and bleeding.    Oozing from left mainstem controlled with cryoprobe, cold saline, and topical 1:10,000 epi.   Tumor encroaching of LUL and occluding lingula    Post procedure: right lung open down to subsegmental level, left remains mostly occluded with no active bleeding     Specimen(s): BAL RLL  Complications: None

## 2019-11-03 NOTE — Consult Note (Signed)
Dear Doctor:  This patient has been identified as a candidate for PICC line exchange to a triple lumen PICC for the following reason (s): IV therapy over 48 hours, drug pH or osmolality (causing phlebitis, infiltration in 24 hours), drug extravasation potential with tissue necrosis (KCL, Dilantin, Dopamine, CaCl, MgSO4, chemo vesicant), poor veins/poor circulatory system (CHF, COPD, emphysema, diabetes, steroid use, IV drug abuse, etc.) and incompatible drugs (aminophyllin, TPN, heparin, given with an antibiotic) If you agree, please write an order for the indicated device.   Thank you for supporting the early vascular access assessment program.

## 2019-11-03 NOTE — Anesthesia Postprocedure Evaluation (Signed)
Anesthesia Post Note  Patient: GROVE DEFINA  Procedure(s) Performed: VIDEO BRONCHOSCOPY WITH ERBE CRYO PROBE (N/A )     Patient location during evaluation: SICU Anesthesia Type: General Level of consciousness: sedated Pain management: pain level controlled Vital Signs Assessment: post-procedure vital signs reviewed and stable Respiratory status: patient remains intubated per anesthesia plan Cardiovascular status: stable Postop Assessment: no apparent nausea or vomiting Anesthetic complications: no    Last Vitals:  Vitals:   11/19/2019 1130 11/01/2019 1402  BP: 123/69   Pulse: 78   Resp: 19   Temp:    SpO2: (!) 89% 94%    Last Pain:  Vitals:   11/10/2019 1100  TempSrc: Axillary  PainSc:                  Ashland Wiseman,W. EDMOND

## 2019-11-04 ENCOUNTER — Inpatient Hospital Stay (HOSPITAL_COMMUNITY): Payer: Medicaid Other

## 2019-11-04 ENCOUNTER — Ambulatory Visit: Payer: Medicaid Other

## 2019-11-04 LAB — HEMOGLOBIN A1C
Hgb A1c MFr Bld: 6.5 % — ABNORMAL HIGH (ref 4.8–5.6)
Mean Plasma Glucose: 139.85 mg/dL

## 2019-11-04 LAB — GLUCOSE, CAPILLARY
Glucose-Capillary: 119 mg/dL — ABNORMAL HIGH (ref 70–99)
Glucose-Capillary: 145 mg/dL — ABNORMAL HIGH (ref 70–99)
Glucose-Capillary: 155 mg/dL — ABNORMAL HIGH (ref 70–99)
Glucose-Capillary: 182 mg/dL — ABNORMAL HIGH (ref 70–99)
Glucose-Capillary: 234 mg/dL — ABNORMAL HIGH (ref 70–99)
Glucose-Capillary: 253 mg/dL — ABNORMAL HIGH (ref 70–99)
Glucose-Capillary: 261 mg/dL — ABNORMAL HIGH (ref 70–99)

## 2019-11-04 LAB — COMPREHENSIVE METABOLIC PANEL
ALT: 132 U/L — ABNORMAL HIGH (ref 0–44)
AST: 42 U/L — ABNORMAL HIGH (ref 15–41)
Albumin: 2 g/dL — ABNORMAL LOW (ref 3.5–5.0)
Alkaline Phosphatase: 191 U/L — ABNORMAL HIGH (ref 38–126)
Anion gap: 8 (ref 5–15)
BUN: 38 mg/dL — ABNORMAL HIGH (ref 8–23)
CO2: 24 mmol/L (ref 22–32)
Calcium: 7.2 mg/dL — ABNORMAL LOW (ref 8.9–10.3)
Chloride: 112 mmol/L — ABNORMAL HIGH (ref 98–111)
Creatinine, Ser: 0.84 mg/dL (ref 0.61–1.24)
GFR calc Af Amer: >60 mL/min (ref >60)
GFR calc non Af Amer: >60 mL/min (ref >60)
Glucose, Bld: 286 mg/dL — ABNORMAL HIGH (ref 70–99)
Potassium: 4.3 mmol/L (ref 3.5–5.1)
Sodium: 144 mmol/L (ref 135–145)
Total Bilirubin: 1 mg/dL (ref 0.3–1.2)
Total Protein: 4.5 g/dL — ABNORMAL LOW (ref 6.5–8.1)

## 2019-11-04 LAB — MAGNESIUM
Magnesium: 1.9 mg/dL (ref 1.7–2.4)
Magnesium: 1.8 mg/dL (ref 1.7–2.4)

## 2019-11-04 LAB — CBC WITH DIFFERENTIAL/PLATELET
Abs Immature Granulocytes: 0.32 10*3/uL — ABNORMAL HIGH (ref 0.00–0.07)
Basophils Absolute: 0 10*3/uL (ref 0.0–0.1)
Basophils Relative: 2 %
Eosinophils Absolute: 0 10*3/uL (ref 0.0–0.5)
Eosinophils Relative: 0 %
HCT: 22.4 % — ABNORMAL LOW (ref 39.0–52.0)
Hemoglobin: 7.1 g/dL — ABNORMAL LOW (ref 13.0–17.0)
Immature Granulocytes: 24 %
Lymphocytes Relative: 18 %
Lymphs Abs: 0.2 10*3/uL — ABNORMAL LOW (ref 0.7–4.0)
MCH: 31.4 pg (ref 26.0–34.0)
MCHC: 31.7 g/dL (ref 30.0–36.0)
MCV: 99.1 fL (ref 80.0–100.0)
Monocytes Absolute: 0 10*3/uL — ABNORMAL LOW (ref 0.1–1.0)
Monocytes Relative: 1 %
Neutro Abs: 0.7 10*3/uL — ABNORMAL LOW (ref 1.7–7.7)
Neutrophils Relative %: 55 %
Platelets: 122 10*3/uL — ABNORMAL LOW (ref 150–400)
RBC: 2.26 MIL/uL — ABNORMAL LOW (ref 4.22–5.81)
RDW: 14.2 % (ref 11.5–15.5)
WBC: 1.3 10*3/uL — CL (ref 4.0–10.5)
nRBC: 0 % (ref 0.0–0.2)

## 2019-11-04 LAB — CYTOLOGY - NON PAP

## 2019-11-04 LAB — PHOSPHORUS
Phosphorus: 3.4 mg/dL (ref 2.5–4.6)
Phosphorus: 1.7 mg/dL — ABNORMAL LOW (ref 2.5–4.6)

## 2019-11-04 IMAGING — DX DG CHEST 1V PORT
1 series · 1 of 1 positions shown · non-contrast
Comparison: Radiograph [DATE]

CLINICAL DATA: Acute respiratory failure

EXAM:
PORTABLE CHEST 1 VIEW

[chest]
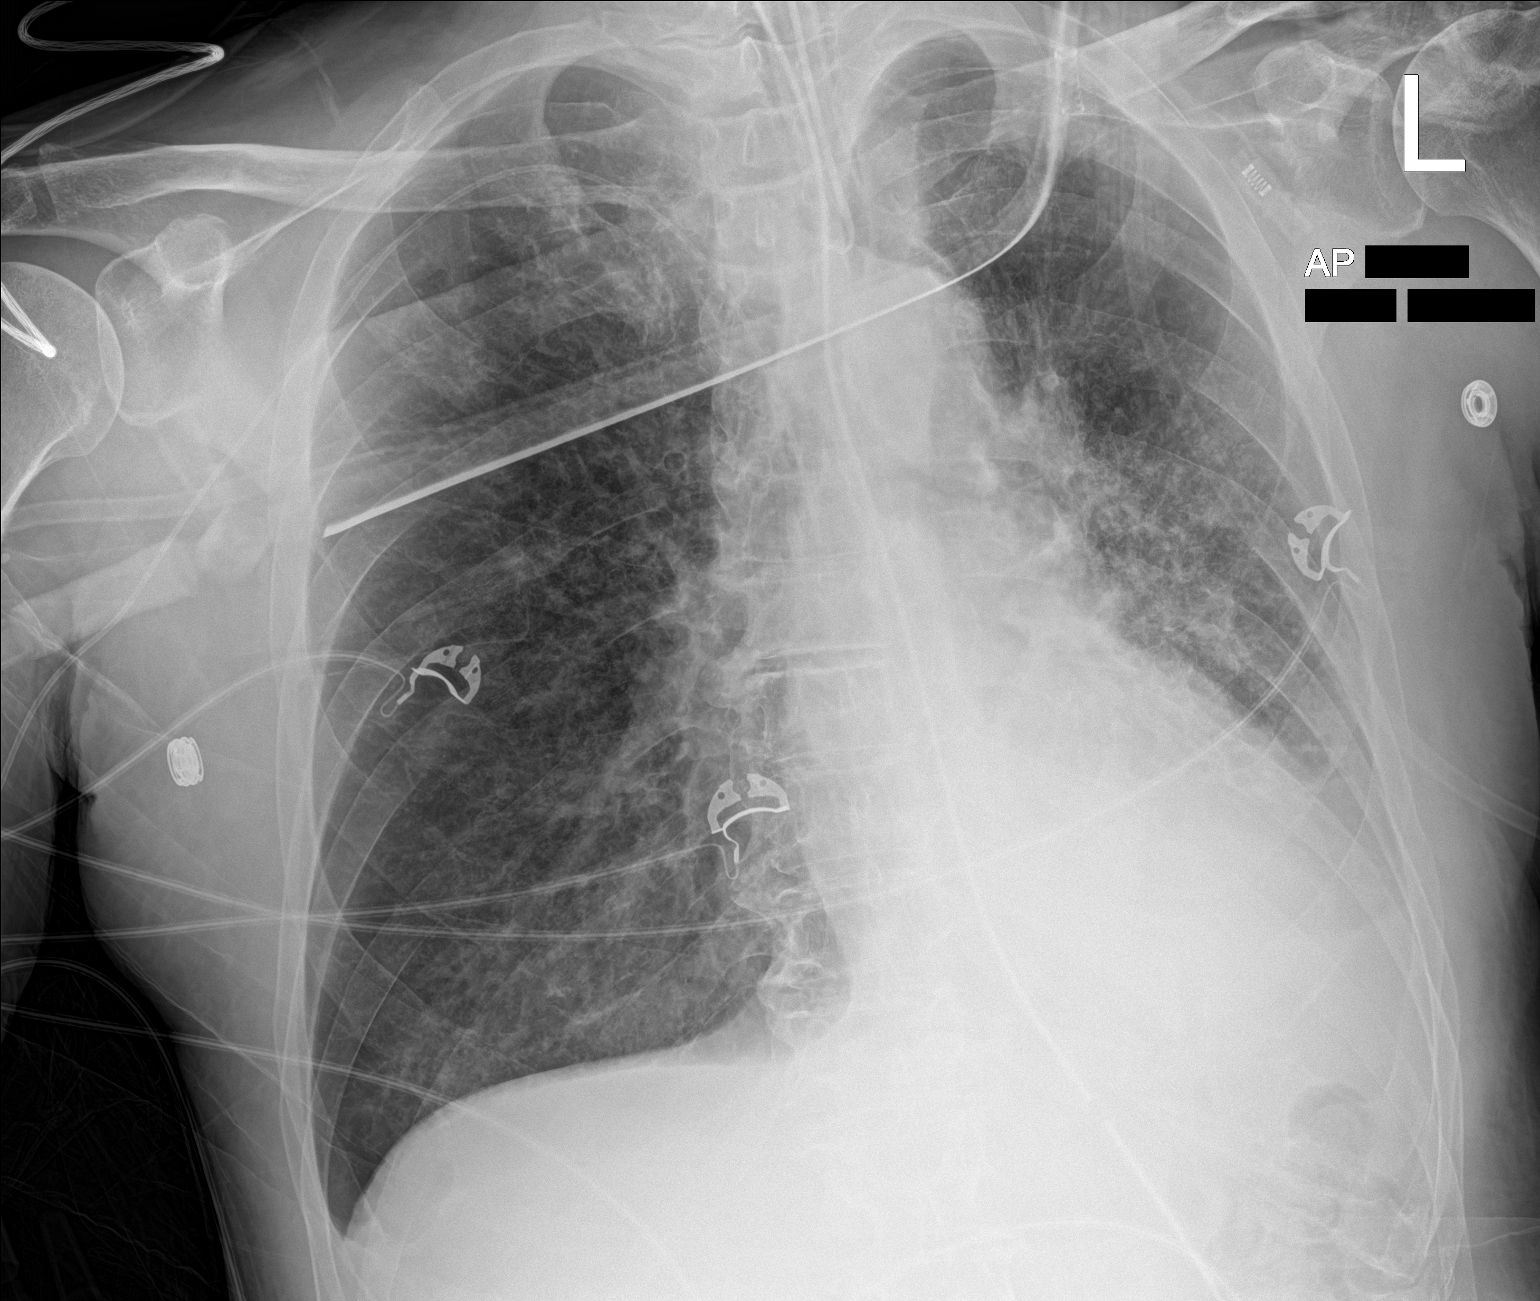

[1 of 1 positions shown; findings below may reference images not displayed]

FINDINGS: Endotracheal tube terminates in the mid trachea, 5 cm from the
carina. Transesophageal tube tip is below the level of imaging with
the side port positioned near the GE junction and could be advanced
at least 3 cm for optimal functioning. Diffuse mixed interstitial
and patchy airspace opacity more pronounced in the left lung base
with small bilateral effusions, left greater than right. No
pneumothorax. No acute osseous or soft tissue abnormality.
Degenerative changes are present in the imaged spine and shoulders.
Telemetry leads overlie the chest.
IMPRESSION: Endotracheal tube terminates in the mid trachea, 5 cm from the
carina.

Transesophageal tube tip is below the level of imaging with the side
port positioned near the GE junction and could be advanced at least
3 cm for optimal functioning.

Heterogenous interstitial and airspace disease most confluent in the
left lung base with small bilateral effusions, left greater than
right.

## 2019-11-04 MED ORDER — ROCURONIUM BROMIDE 50 MG/5ML IV SOLN: 60.0000 mg | Freq: Once | INTRAVENOUS | Status: AC

## 2019-11-04 MED ORDER — ROCURONIUM BROMIDE 10 MG/ML (PF) SYRINGE
PREFILLED_SYRINGE | INTRAVENOUS | Status: AC
  Administered 2019-11-04: 60 mg
  Filled 2019-11-04: qty 10

## 2019-11-04 MED ORDER — CHLORHEXIDINE GLUCONATE 0.12 % MT SOLN
15.0000 mL | Freq: Two times a day (BID) | OROMUCOSAL | Status: DC
  Administered 2019-11-04 – 2019-11-07 (×6): 15 mL via OROMUCOSAL
  Filled 2019-11-04 (×3): qty 15

## 2019-11-04 MED ORDER — MIDAZOLAM HCL 2 MG/2ML IJ SOLN
1.0000 mg | Freq: Once | INTRAMUSCULAR | Status: DC
  Filled 2019-11-04: qty 2

## 2019-11-04 MED ORDER — PREDNISONE 10 MG PO TABS: 20.0000 mg | ORAL_TABLET | Freq: Every day | ORAL | Status: DC

## 2019-11-04 MED ORDER — AMIODARONE HCL IN DEXTROSE 360-4.14 MG/200ML-% IV SOLN
30.0000 mg/h | INTRAVENOUS | Status: DC
  Administered 2019-11-05: 30 mg/h via INTRAVENOUS
  Filled 2019-11-04: qty 200

## 2019-11-04 MED ORDER — INSULIN ASPART 100 UNIT/ML ~~LOC~~ SOLN
2.0000 [IU] | SUBCUTANEOUS | Status: DC
  Administered 2019-11-04 (×2): 6 [IU] via SUBCUTANEOUS

## 2019-11-04 MED ORDER — ORAL CARE MOUTH RINSE
15.0000 mL | Freq: Two times a day (BID) | OROMUCOSAL | Status: DC
  Administered 2019-11-04 – 2019-11-07 (×6): 15 mL via OROMUCOSAL

## 2019-11-04 MED ORDER — AMIODARONE LOAD VIA INFUSION
150.0000 mg | Freq: Once | INTRAVENOUS | Status: AC
  Administered 2019-11-04: 150 mg via INTRAVENOUS
  Filled 2019-11-04: qty 83.34

## 2019-11-04 MED ORDER — MAGNESIUM SULFATE 2 GM/50ML IV SOLN
2.0000 g | Freq: Once | INTRAVENOUS | Status: AC
  Administered 2019-11-04: 2 g via INTRAVENOUS
  Filled 2019-11-04: qty 50

## 2019-11-04 MED ORDER — INSULIN ASPART 100 UNIT/ML ~~LOC~~ SOLN
0.0000 [IU] | SUBCUTANEOUS | Status: DC
  Administered 2019-11-04 (×2): 3 [IU] via SUBCUTANEOUS
  Administered 2019-11-05: 2 [IU] via SUBCUTANEOUS

## 2019-11-04 MED ORDER — AMIODARONE HCL IN DEXTROSE 360-4.14 MG/200ML-% IV SOLN
60.0000 mg/h | INTRAVENOUS | Status: DC
  Administered 2019-11-04 (×2): 60 mg/h via INTRAVENOUS
  Filled 2019-11-04 (×2): qty 200

## 2019-11-04 NOTE — Progress Notes (Signed)
NAME:  Drew Nguyen, MRN:  [id number], DOB:  1955-10-29, LOS: 20 ADMISSION DATE:  10/15/2019, CONSULTATION DATE:  10/17/19 REFERRING MD:  [place], CHIEF COMPLAINT:  Lung mass    Brief History   64 yo man admitted with encephalopathy found to have left upper lobe lung mass.  Initial PCCM consult was 2/26 for lung mass.  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  Encephalopathy - suspect paraneoplastic process from [place] but LP was never done.  PCCM + after diagnosis.  Has been getting inpatient chemotherapy and radiation at [facility]. Had massive hemoptysis on the floor at [facility] and transferred back to [facility] hospital 3/13 evening.  PCCM reconsulted for hemoptysis respiratory failure.  Consults:  CCM, Oncology  Procedures:  Bronchoscopy with EBUS 3/2 Intubation 3/13 Bronchoscopy 3/13 Bronchoscopy 3/14 Bronchoscopy 3/16  Significant Diagnostic Tests:  CT angio chest 3/14 The debris within the trachea and mainstem bronchi corresponds to hemorrhage. Some of the patchy opacities in the lungs bilaterally may reflect hemorrhage rather than infection, particularly in the superior segment left lower lobe (for example, series 8/image 31). However, when closely evaluating this region, it still appears to have a pulmonary arterial/venous supply. An aberrant bronchial artery source is not definitely identified.  Prior study also noted narrowing of a left upper lobe pulmonary artery anteriorly. This is improved on the current study, presumably following chemotherapy.   Micro Data:  3/13 MRSA PCR negative 3/13 Covid negative  Antimicrobials:   vancomycin 3/14>>>3/15 cefepime 3/14>>>  Interim history/subjective:  No further hemoptysis. Mild hypotension, improved with bolus.  Now in NSR. Remains on amio gtt.    Objective   Blood pressure 128/72, pulse 71, temperature (!) 97.4 F (36.3 C), temperature source Axillary, resp. rate 18, height 5'  6" (1.676 m), weight 66.7 kg, SpO2 97 %.    Vent Mode: PRVC FiO2 (%):  [50 %-100 %] 50 % Set Rate:  [18 bmp] 18 bmp Vt Set:  [510 mL] 510 mL PEEP:  [14 cmH20] 14 cmH20 Plateau Pressure:  [27 cmH20-30 cmH20] 27 cmH20   Intake/Output Summary (Last 24 hours) at 11/04/2019 0841 Last data filed at 11/04/2019 0600 Gross per 24 hour  Intake 5160.97 ml  Output 1405 ml  Net 3755.97 ml   Filed Weights   11/01/19 1941 11/02/19 0000 11/04/19 0500  Weight: 62.5 kg 62 kg 66.7 kg    Examination: General: chronically ill appearing male, NAD on vent post FOB HENT: ETT, mm moist, no JVD  Lungs: resps even non labored on vent post bronch. Very diminished L, mild wheezing R.  Cardiovascular: Regular rate and rhythm, extremities warm.  No JVD. Abdomen: Soft, nontender Extremities: No edema Neuro: Sedated post bronch, RASS-2, but moves all limbs to stimulation. MSK: skin warm and dry  Lines: PICC line   Assessment & Plan:    Critically ill due to acute hypoxic hypercapnic respiratory failure requiring mechanical ventilation secondary to massive hemoptysis. PLAN -  Vent support - 8cc/kg  F/u CXR  F/u ABG Will likely require XRT prior to extubation  PS trials   Blood loss anemia  PLAN -  F/u cbc    Massive hemoptysis secondary to metastatic small cell lung cancer. 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  Predates critical  illness.  Ammonia only mildly elevated.  ?paraneoplastic syndrome r/t [place] lung ca  PLAN -  Holding plavix  Wean sedation as able  PAD protocol   Rheumatoid arthritis -On prednisone 20 mg.  Holding Plaquenil and Enbrel   COPD with ongoing tobacco use disorder -continue bronchodilators for wheezing. -On chronic steroids for rheumatoid  arthritis.  Afib with RVR -- now NSR  PLAN -  Holding anticoagulation  Continue amiodarone gtt   Hyperglycemia  PLAN -  SSI - increase to moderate scale  Consider addition lantus     Daily Goals Checklist  Pain/Anxiety/Delirium protocol (if indicated): Continue fentanyl and propofol through bronchoscopy and wean thereafter. VAP protocol (if indicated): Bundle in place Respiratory support goals: Full ventilatory support Blood pressure target: On no vasopressors.  Keep MAP greater than 65 DVT prophylaxis: SCDs only due to active bleeding Nutrition: High nutritional risk.  Initiate tube feeding GI prophylaxis: Pantoprazole Fluid status goals: Clinically euvolemic.  Allow autoregulation Urinary catheter: Guide hemodynamic management Central lines: Right antecubital single-lumen PICC. Glucose control: No history of diabetes.  Currently euglycemic on no therapy.  Monitor for increase blood sugar with feeds. Mobility/therapy needs: Bedrest for now. Antibiotic de-escalation: Cefepime for postobstructive pneumonia. Home medication reconciliation: Reviewed.  Home antirheumatic agents on hold. Daily labs: CBC CMP Code Status: Full code.  We will need to readdress goals of care given the poor long-term prognosis. Family Communication: We will update brother following bronchoscopy. Disposition: ICU   Labs   CBC: Recent Labs  Lab 11/01/19 1907 11/01/19 2347 11/02/19 1058 11/02/19 1700 10/20/2019 0528 10/30/2019 1825 11/04/19 0503  WBC 12.8*   < > 10.0 8.9 7.4 3.0* 1.3*  NEUTROABS 10.4*  --   --   --   --   --  0.7*  HGB 9.5*   < > 8.3* 7.9* 7.8* 7.9* 7.1*  HCT 28.3*   < > 25.1* 24.3* 23.6* 25.0* 22.4*  MCV 96.6   < > 95.1 95.7 96.7 98.0 99.1  PLT 158   < > 129* 124* 129* 144* 122*   < > = values in this interval not displayed.    Basic Metabolic Panel: Recent Labs  Lab 10/31/19 0500 10/31/19 0500 11/01/19 0086 11/01/19 7619 11/01/19 2347 11/02/19 0508 10/20/2019 0528  11/18/2019 1825 10/30/2019 2155 11/04/19 0503  NA 137   < > 138   < > 138 139 145  --  141 144  K 3.7   < > 2.6*   < > 3.7 3.8 4.0  --  4.8 4.3  CL 102   < > 100  --  101  --  108  --  109 112*  CO2 27   < > 28  --  28  --  27  --  23 24  GLUCOSE 203*   < > 253*  --  195*  --  152*  --  264* 286*  BUN 21   < > 18  --  25*  --  29*  --  34* 38*  CREATININE 0.60*   < > 0.52*  --  0.64  --  0.61  --  0.72 0.84  CALCIUM 8.0*   < > 7.6*  --  7.4*  --  7.3*  --  7.2* 7.2*  MG 1.8   < > 1.8  --  2.2  --  2.2 2.0  --  1.9  PHOS 2.8  --  2.8  --   --   --  3.1 4.2  --  3.4   < > = values in this interval not displayed.   GFR: Estimated Creatinine Clearance: 80.2 mL/min (by C-G formula based on SCr of 0.84 mg/dL). Recent Labs  Lab 11/02/19 1700 10/28/2019 0528 11/19/2019 1825 11/04/19 0503  WBC 8.9 7.4 3.0* 1.3*    Liver Function Tests: Recent Labs  Lab 10/29/19 0530 10/30/19 0443 10/31/19 0500 11/01/19 2347 11/04/19 0503  AST 144* 141* 101* 73* 42*  ALT 311* 338* 266* 194* 132*  ALKPHOS 412* 456* 337* 314* 191*  BILITOT 1.3* 1.4* 0.8 1.2 1.0  PROT 5.4* 5.7* 5.1* 4.7* 4.5*  ALBUMIN 2.7* 2.8* 2.3* 2.1* 2.0*   No results for input(s): LIPASE, AMYLASE in the last 168 hours. No results for input(s): AMMONIA in the last 168 hours.  ABG    Component Value Date/Time   PHART 7.410 11/02/2019 0508   PCO2ART 46.2 11/02/2019 0508   PO2ART 68.0 (L) 11/02/2019 0508   HCO3 29.3 (H) 11/02/2019 0508   TCO2 31 11/02/2019 0508   O2SAT 93.0 11/02/2019 0508     Coagulation Profile: Recent Labs  Lab 11/02/2019 0528  INR 1.0    Cardiac Enzymes: No results for input(s): CKTOTAL, CKMB, CKMBINDEX, TROPONINI in the last 168 hours.  HbA1C: Hgb A1c MFr Bld  Date/Time Value Ref Range Status  06/06/2019 03:27 AM 5.4 4.8 - 5.6 % Final    Comment:    (NOTE) Pre diabetes:          5.7%-6.4% Diabetes:              >6.4% Glycemic control for   <7.0% adults with diabetes   04/20/2019 05:32 AM  5.1 4.8 - 5.6 % Final    Comment:    (NOTE) Pre diabetes:          5.7%-6.4% Diabetes:              >6.4% Glycemic control for   <7.0% adults with diabetes     CBG: Recent Labs  Lab 10/20/2019 1522 10/27/2019 2031 11/04/19 0119 11/04/19 0449 11/04/19 0712  GLUCAP 164* 218* 253* 261* 234*    CRITICAL CARE Performed by: Darlina Sicilian   Total critical care time: 32 minutes  Critical care time was exclusive of separately billable procedures and treating other patients.  Critical care was necessary to treat or prevent imminent or life-threatening deterioration.  Critical care was time spent personally by me on the following activities: development of treatment plan with patient and/or surrogate as well as nursing, discussions with consultants, evaluation of patient's response to treatment, examination of patient, obtaining history from patient or surrogate, ordering and performing treatments and interventions, ordering and review of laboratory studies, ordering and review of radiographic studies, pulse oximetry, re-evaluation of patient's condition and participation in multidisciplinary rounds.     Nickolas Madrid, NP Pulmonary/Critical Care Medicine  11/04/2019  8:41 AM

## 2019-11-04 NOTE — Progress Notes (Signed)
[facility] Progress Note Patient Name: Drew Nguyen DOB: [birth year] MRN: [id number]   Date of Service  11/04/2019  HPI/Events of Note  Pt needs restraint order, a.m. CXR, SSI coverage orders .  eICU Interventions  Restraints ordered, a.m. CXR ordered, SSI coverage ordered.        [name] 11/04/2019, 2:51 AM

## 2019-11-04 NOTE — Procedures (Signed)
Bronchoscopy  Indication: Re-look at massive hemoptysis  Consent: Signed in chart  Anesthesia: Already in place, 1 x rocuronium given  Procedure - Timeout performed - Bronchoscope advanced through ETT - Airways examined down to subsegmental level  Findings - Inspissatedclot occluding distal left mainstem, there does appear to be bubbles leading to LUL, this aeration may unfortunately go away if extubated - No active bleeding - ETT in good position  Specimen(s): none  Complications: none

## 2019-11-04 NOTE — Progress Notes (Signed)
CRITICAL VALUE ALERT  Critical Value:  WBC 1.3  Date & Time Notied:  11/04/19 0809  Provider Notified: Charlsie Quest, MD  Orders Received/Actions taken: No new orders given at this time

## 2019-11-04 NOTE — Progress Notes (Signed)
Pulmonary Progress Note  S Seen in f/u for massive hemoptysis No events overnight. Afib/RVR resolved, likely induced by endobronchial epi yesterday Off pressors Moving all 4 ext per nursing On heavy sedation  O Today's Vitals   11/04/19 0500 11/04/19 0600 11/04/19 0700 11/04/19 0722  BP: 132/69 128/72    Pulse: 72 71    Resp: 18 18    Temp:   (!) 97.4 F (36.3 C)   TempSrc:   Axillary   SpO2: 97% 96%  97%  Weight: 66.7 kg     Height:      PainSc:       Body mass index is 23.73 kg/m. Chronically ill appearing man ETT in place, minimal secretions Ext warm with scattered bruising RASS -4  Developing pancytopenia noted. CXR with good aeration of LUL and right  A: Acute hypoxemic respiratory respiratory failure due to massive hemoptysis from endobronchial small cell lung cancer.  Reactive Afib to epi given during case yesterday  Transient hypotension resolved  P: - Repeat bronch today - Will consider vent wean and extubation so we can discuss goals of care - If all measures desired and stable post-extubation then would send back over to [facility] for further radiation - Reviewing case and bronch findings, I do not think there is a viable (endobronchial or endovascular) approach other than targeted radiation - He is extremely high risk for recurrent massive hemoptysis especially with developing thrombocytopenia - Discussed case with Dr. Valeta Harms and Leane Platt MD PCCM

## 2019-11-04 NOTE — Procedures (Signed)
Extubation Procedure Note  Patient Details:   Name: JAQUAVIAN FIRKUS DOB: [birth year] MRN: [id number]   Airway Documentation:    Vent end date: 11/04/19 Vent end time: 1430   Evaluation  O2 sats: fell post extubation, placed on 10L [place] [facility] [place] Complications: No apparent complications Patient did tolerate procedure well. Bilateral Breath Sounds: Clear, Diminished   Patient extubated per MD order. IS instructed 750x5.  Kathie Dike 11/04/2019, 2:42 PM

## 2019-11-04 NOTE — Procedures (Signed)
Bedside Bronchoscopy Procedure Note Drew Nguyen [id number] Jan 05, 1956  Procedure: Bronchoscopy Indications: Diagnostic evaluation of the airways and Remove secretions  Procedure Details: ET Tube Size: ET Tube secured at lip (cm): Bite block in place: No In preparation for procedure, Patient hyper-oxygenated with 100 % FiO2 Airway entered and the following bronchi were examined: RUL, RML, RLL, LUL, LLL and Bronchi.   Bronchoscope removed.  , Patient placed back on 50% FiO2 at conclusion of procedure.    Evaluation BP 128/72   Pulse 71   Temp (!) 97.4 F (36.3 C) (Axillary)   Resp 18   Ht 5\' 6"  (1.676 m)   Wt 66.7 kg   SpO2 97%   BMI 23.73 kg/m  Breath Sounds:Clear and Diminished O2 sats: stable throughout Patient's Current Condition: stable Specimens:  None Complications: No apparent complications Patient did tolerate procedure well.   [name] 11/04/2019, 8:25 AM

## 2019-11-04 NOTE — Progress Notes (Addendum)
Physical Therapy Treatment Patient Details Name: Drew Nguyen MRN: [id number] DOB: 07-24-1956 Today's Date: 11/04/2019    History of Present Illness Pt is  an 64 y.o. male [address] male HTN, COPD stage II-III, cocaine/EtOH abuse, depression, rheumatoid arthritis on recently started on Plaquenil, left corona radiata CVA 03/2019 with repeat stroke 06/06/2019,?  Vocal cord malignancy.  He presented to [facility] ED 10/17/2019 secondary generalized tonic-clonic seizures witnessed by home health aide.  Pt found to have L hilar mass that is advanced small cell lung CA.  Per oncology plan for SNF to strength before treatments.    PT Comments    Pt admitted with above diagnosis. Pt still able to move all 4 extremities and actively moving in bed quite a bit. Assisted nurse with cleaning pt.  Pt participates and seems anxious to get moving.   Pt currently with functional limitations due to balance and endurance deficits. Pt will benefit from skilled PT to increase their independence and safety with mobility to allow discharge to the venue listed below.   Pt was on vent with VSS with 60% FiO2, PEEP 5.   Follow Up Recommendations  SNF     Equipment Recommendations  Rolling walker with 5" wheels    Recommendations for Other Services       Precautions / Restrictions Precautions Precautions: Fall Restrictions Weight Bearing Restrictions: No    Mobility  Bed Mobility Overal bed mobility: Needs Assistance Bed Mobility: Rolling Rolling: Min assist         General bed mobility comments: On arrival, once began LE exercises, realized pt wet as condom cath had been off awhile. Asked nusing to assist and cleaned pt and changed linens.    Transfers                 General transfer comment: unable as nurse asked for bed level only  Ambulation/Gait                 Stairs             Wheelchair Mobility    Modified Rankin (Stroke Patients Only)       Balance                                             Cognition Arousal/Alertness: Awake/alert Behavior During Therapy: WFL for tasks assessed/performed Overall Cognitive Status: Within Functional Limits for tasks assessed                                 General Comments: needed encouragement to participate      Exercises General Exercises - Upper Extremity Shoulder Flexion: AAROM;Both;10 reps;Supine Shoulder Horizontal ADduction: AAROM;Both;10 reps;Supine Elbow Flexion: AAROM;Both;10 reps;Supine Elbow Extension: AROM;Both;10 reps;Supine General Exercises - Lower Extremity Ankle Circles/Pumps: AROM;Both;10 reps;Supine Quad Sets: AROM;Both;10 reps;Supine Heel Slides: AAROM;Both;10 reps;Supine Hip ABduction/ADduction: AAROM;Both;10 reps;Supine    General Comments General comments (skin integrity, edema, etc.): VSS with pt on PS/CPAP at 60% FiO2 and PEEP 5. Nurse was aware that BP was 208/87      Pertinent Vitals/Pain Pain Assessment: No/denies pain    Home Living                      Prior Function            PT  Goals (current goals can now be found in the care plan section) Acute Rehab PT Goals Patient Stated Goal: SNF to get strong enough for CA treatments Progress towards PT goals: Progressing toward goals    Frequency    Min 2X/week      PT Plan Current plan remains appropriate    Co-evaluation              AM-PAC PT "6 Clicks" Mobility   Outcome Measure  Help needed turning from your back to your side while in a flat bed without using bedrails?: A Little Help needed moving from lying on your back to sitting on the side of a flat bed without using bedrails?: A Little Help needed moving to and from a bed to a chair (including a wheelchair)?: A Lot Help needed standing up from a chair using your arms (e.g., wheelchair or bedside chair)?: A Lot Help needed to walk in hospital room?: Total Help needed climbing 3-5 steps with a railing? :  Total 6 Click Score: 12    End of Session Equipment Utilized During Treatment: (on vent) Activity Tolerance: Patient tolerated treatment well;Patient limited by fatigue Patient left: with call bell/phone within reach;in bed;with bed alarm set;with SCD's reapplied;with restraints reapplied Nurse Communication: Mobility status PT Visit Diagnosis: Unsteadiness on feet (R26.81);Muscle weakness (generalized) (M62.81)     Time: 3435-6861 PT Time Calculation (min) (ACUTE ONLY): 25 min  Charges:  $Therapeutic Exercise: 23-37 mins                     Seniah Lawrence W,PT Acute Rehabilitation Services Pager:  [phone]  Office:  [place] 11/04/2019, 3:22 PM

## 2019-11-05 ENCOUNTER — Inpatient Hospital Stay (HOSPITAL_COMMUNITY): Payer: Medicaid Other

## 2019-11-05 ENCOUNTER — Ambulatory Visit: Payer: Medicaid Other

## 2019-11-05 LAB — GLUCOSE, CAPILLARY
Glucose-Capillary: 108 mg/dL — ABNORMAL HIGH (ref 70–99)
Glucose-Capillary: 125 mg/dL — ABNORMAL HIGH (ref 70–99)
Glucose-Capillary: 137 mg/dL — ABNORMAL HIGH (ref 70–99)

## 2019-11-05 LAB — COMPREHENSIVE METABOLIC PANEL
ALT: 123 U/L — ABNORMAL HIGH (ref 0–44)
ALT: 122 U/L — ABNORMAL HIGH (ref 0–44)
AST: 54 U/L — ABNORMAL HIGH (ref 15–41)
AST: 57 U/L — ABNORMAL HIGH (ref 15–41)
Albumin: 2 g/dL — ABNORMAL LOW (ref 3.5–5.0)
Albumin: 2.1 g/dL — ABNORMAL LOW (ref 3.5–5.0)
Alkaline Phosphatase: 180 U/L — ABNORMAL HIGH (ref 38–126)
Alkaline Phosphatase: 172 U/L — ABNORMAL HIGH (ref 38–126)
Anion gap: 12 (ref 5–15)
Anion gap: 12 (ref 5–15)
BUN: 16 mg/dL (ref 8–23)
BUN: 16 mg/dL (ref 8–23)
CO2: 29 mmol/L (ref 22–32)
CO2: 28 mmol/L (ref 22–32)
Calcium: 7.6 mg/dL — ABNORMAL LOW (ref 8.9–10.3)
Calcium: 7.5 mg/dL — ABNORMAL LOW (ref 8.9–10.3)
Chloride: 106 mmol/L (ref 98–111)
Chloride: 104 mmol/L (ref 98–111)
Creatinine, Ser: 0.41 mg/dL — ABNORMAL LOW (ref 0.61–1.24)
Creatinine, Ser: 0.53 mg/dL — ABNORMAL LOW (ref 0.61–1.24)
GFR calc Af Amer: >60 mL/min (ref >60)
GFR calc Af Amer: >60 mL/min (ref >60)
GFR calc non Af Amer: >60 mL/min (ref >60)
GFR calc non Af Amer: >60 mL/min (ref >60)
Glucose, Bld: 115 mg/dL — ABNORMAL HIGH (ref 70–99)
Glucose, Bld: 128 mg/dL — ABNORMAL HIGH (ref 70–99)
Potassium: <2 mmol/L — CL (ref 3.5–5.1)
Potassium: 2.1 mmol/L — CL (ref 3.5–5.1)
Sodium: 147 mmol/L — ABNORMAL HIGH (ref 135–145)
Sodium: 144 mmol/L (ref 135–145)
Total Bilirubin: 1.5 mg/dL — ABNORMAL HIGH (ref 0.3–1.2)
Total Bilirubin: 1.6 mg/dL — ABNORMAL HIGH (ref 0.3–1.2)
Total Protein: 5 g/dL — ABNORMAL LOW (ref 6.5–8.1)
Total Protein: 4.9 g/dL — ABNORMAL LOW (ref 6.5–8.1)

## 2019-11-05 LAB — CBC WITH DIFFERENTIAL/PLATELET
Abs Immature Granulocytes: 0.01 10*3/uL (ref 0.00–0.07)
Abs Immature Granulocytes: 0 10*3/uL (ref 0.00–0.07)
Basophils Absolute: 0 10*3/uL (ref 0.0–0.1)
Basophils Absolute: 0 10*3/uL (ref 0.0–0.1)
Basophils Relative: 2 %
Basophils Relative: 0 %
Eosinophils Absolute: 0 10*3/uL (ref 0.0–0.5)
Eosinophils Absolute: 0 10*3/uL (ref 0.0–0.5)
Eosinophils Relative: 0 %
Eosinophils Relative: 0 %
HCT: 20.4 % — ABNORMAL LOW (ref 39.0–52.0)
HCT: 20.1 % — ABNORMAL LOW (ref 39.0–52.0)
Hemoglobin: 6.7 g/dL — CL (ref 13.0–17.0)
Hemoglobin: 6.7 g/dL — CL (ref 13.0–17.0)
Immature Granulocytes: 2 %
Immature Granulocytes: 0 %
Lymphocytes Relative: 41 %
Lymphocytes Relative: 35 %
Lymphs Abs: 0.2 10*3/uL — ABNORMAL LOW (ref 0.7–4.0)
Lymphs Abs: 0.2 10*3/uL — ABNORMAL LOW (ref 0.7–4.0)
MCH: 30.7 pg (ref 26.0–34.0)
MCH: 31.8 pg (ref 26.0–34.0)
MCHC: 32.8 g/dL (ref 30.0–36.0)
MCHC: 33.3 g/dL (ref 30.0–36.0)
MCV: 93.6 fL (ref 80.0–100.0)
MCV: 95.3 fL (ref 80.0–100.0)
Monocytes Absolute: 0 10*3/uL — ABNORMAL LOW (ref 0.1–1.0)
Monocytes Absolute: 0 10*3/uL — ABNORMAL LOW (ref 0.1–1.0)
Monocytes Relative: 2 %
Monocytes Relative: 2 %
Neutro Abs: 0.3 10*3/uL — ABNORMAL LOW (ref 1.7–7.7)
Neutro Abs: 0.3 10*3/uL — ABNORMAL LOW (ref 1.7–7.7)
Neutrophils Relative %: 53 %
Neutrophils Relative %: 63 %
Platelets: 89 10*3/uL — ABNORMAL LOW (ref 150–400)
Platelets: 88 10*3/uL — ABNORMAL LOW (ref 150–400)
RBC: 2.18 MIL/uL — ABNORMAL LOW (ref 4.22–5.81)
RBC: 2.11 MIL/uL — ABNORMAL LOW (ref 4.22–5.81)
RDW: 13.6 % (ref 11.5–15.5)
RDW: 13.4 % (ref 11.5–15.5)
WBC: 0.5 10*3/uL — CL (ref 4.0–10.5)
WBC: 0.5 10*3/uL — CL (ref 4.0–10.5)
nRBC: 0 % (ref 0.0–0.2)
nRBC: 0 % (ref 0.0–0.2)

## 2019-11-05 LAB — MAGNESIUM
Magnesium: 1.8 mg/dL (ref 1.7–2.4)
Magnesium: 1.8 mg/dL (ref 1.7–2.4)

## 2019-11-05 LAB — PHOSPHORUS
Phosphorus: 1.6 mg/dL — ABNORMAL LOW (ref 2.5–4.6)
Phosphorus: 1.7 mg/dL — ABNORMAL LOW (ref 2.5–4.6)

## 2019-11-05 LAB — TRIGLYCERIDES: Triglycerides: 113 mg/dL (ref <150)

## 2019-11-05 IMAGING — DX DG CHEST 1V PORT
1 series · 1 of 1 positions shown · non-contrast
Comparison: Yesterday

CLINICAL DATA: Respiratory failure

EXAM:
PORTABLE CHEST 1 VIEW

[chest]
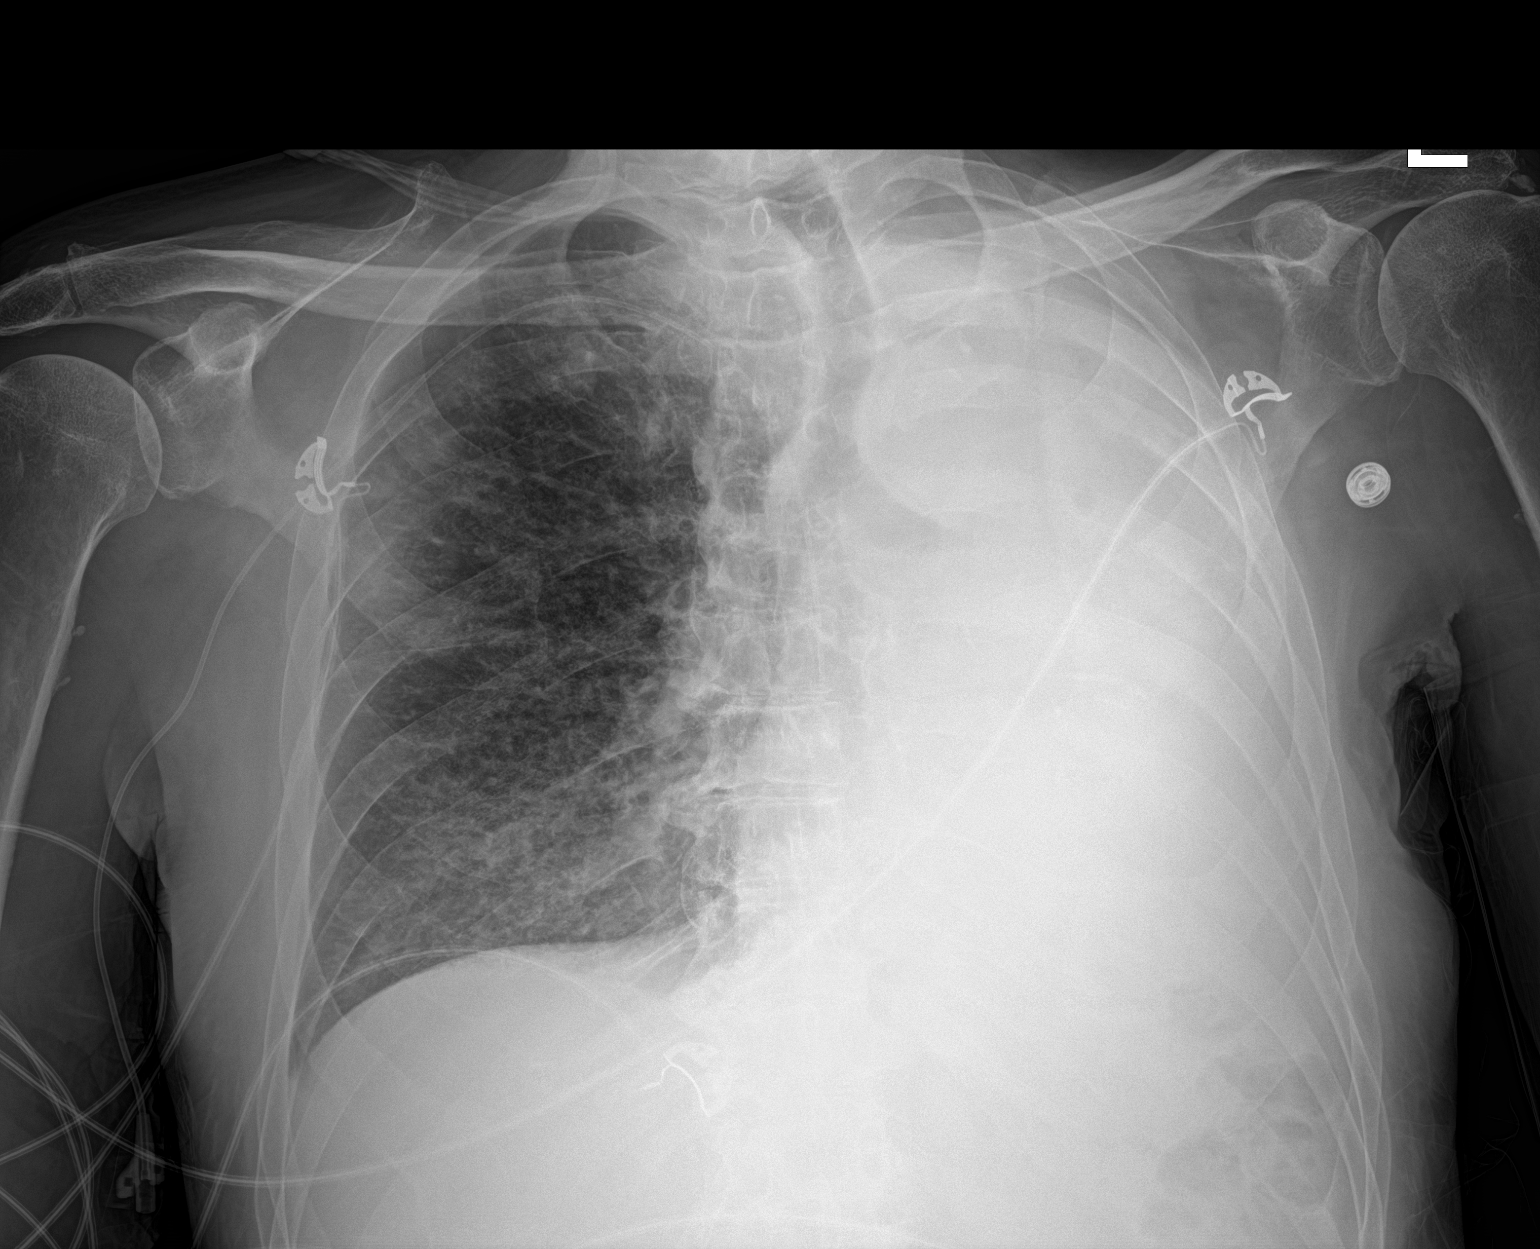

[1 of 1 positions shown; findings below may reference images not displayed]

FINDINGS: Extubation with lower lung volumes and interval left pulmonary
collapse. The left mainstem bronchus is cut off. There is associated
volume loss on the left with mediastinal shift. Reticular opacity in
the right lung is unchanged.
IMPRESSION: Interval collapse of the left lung. Extensive debris was seen in the
airways on CT [DATE].

## 2019-11-05 MED ORDER — MORPHINE 100MG IN NS 100ML (1MG/ML) PREMIX INFUSION
0.0000 mg/h | INTRAVENOUS | Status: DC
  Administered 2019-11-05: 8 mg/h via INTRAVENOUS
  Administered 2019-11-05: 1 mg/h via INTRAVENOUS
  Administered 2019-11-06: 8 mg/h via INTRAVENOUS
  Administered 2019-11-06 – 2019-11-07 (×2): 10 mg/h via INTRAVENOUS
  Filled 2019-11-05 (×6): qty 100

## 2019-11-05 MED ORDER — ACETAMINOPHEN 325 MG PO TABS: 650.0000 mg | ORAL_TABLET | Freq: Four times a day (QID) | ORAL | Status: DC | PRN

## 2019-11-05 MED ORDER — POLYVINYL ALCOHOL 1.4 % OP SOLN
1.0000 [drp] | Freq: Four times a day (QID) | OPHTHALMIC | Status: DC | PRN
  Filled 2019-11-05: qty 15

## 2019-11-05 MED ORDER — LORAZEPAM 2 MG/ML IJ SOLN
2.0000 mg | INTRAMUSCULAR | Status: DC | PRN
  Administered 2019-11-05 – 2019-11-08 (×2): 2 mg via INTRAVENOUS
  Filled 2019-11-05 (×2): qty 1

## 2019-11-05 MED ORDER — LORAZEPAM 2 MG/ML IJ SOLN
2.0000 mg | INTRAMUSCULAR | Status: DC | PRN
  Administered 2019-11-05: 2 mg via INTRAVENOUS
  Filled 2019-11-05: qty 2

## 2019-11-05 MED ORDER — MORPHINE BOLUS VIA INFUSION
2.0000 mg | INTRAVENOUS | Status: DC | PRN
  Administered 2019-11-06: 2 mg via INTRAVENOUS
  Administered 2019-11-07 – 2019-11-08 (×3): 4 mg via INTRAVENOUS
  Filled 2019-11-05: qty 4

## 2019-11-05 MED ORDER — GLYCOPYRROLATE 0.2 MG/ML IJ SOLN: 0.2000 mg | INTRAMUSCULAR | Status: DC | PRN

## 2019-11-05 MED ORDER — MORPHINE SULFATE (PF) 2 MG/ML IV SOLN
2.0000 mg | INTRAVENOUS | Status: DC | PRN
  Administered 2019-11-05 (×2): 2 mg via INTRAVENOUS
  Filled 2019-11-05: qty 2

## 2019-11-05 MED ORDER — IPRATROPIUM-ALBUTEROL 0.5-2.5 (3) MG/3ML IN SOLN
3.0000 mL | Freq: Three times a day (TID) | RESPIRATORY_TRACT | Status: DC
  Administered 2019-11-05: 3 mL via RESPIRATORY_TRACT
  Filled 2019-11-05: qty 3

## 2019-11-05 MED ORDER — LORAZEPAM 2 MG/ML IJ SOLN
2.0000 mg | INTRAMUSCULAR | Status: DC
  Administered 2019-11-05 – 2019-11-08 (×15): 2 mg via INTRAVENOUS
  Filled 2019-11-05 (×15): qty 1

## 2019-11-05 MED ORDER — DIPHENHYDRAMINE HCL 50 MG/ML IJ SOLN: 25.0000 mg | INTRAMUSCULAR | Status: DC | PRN

## 2019-11-05 MED ORDER — GLYCOPYRROLATE 1 MG PO TABS
1.0000 mg | ORAL_TABLET | ORAL | Status: DC | PRN
  Filled 2019-11-05: qty 1

## 2019-11-05 MED ORDER — GLYCOPYRROLATE 0.2 MG/ML IJ SOLN
0.2000 mg | Freq: Three times a day (TID) | INTRAMUSCULAR | Status: DC
  Administered 2019-11-05 – 2019-11-07 (×9): 0.2 mg via INTRAVENOUS
  Filled 2019-11-05 (×10): qty 1

## 2019-11-05 MED ORDER — ACETAMINOPHEN 650 MG RE SUPP: 650.0000 mg | Freq: Four times a day (QID) | RECTAL | Status: DC | PRN

## 2019-11-05 NOTE — Progress Notes (Signed)
OT Cancellation Note  Patient Details Name: DAYMEIN NUNNERY MRN: [id number] DOB: 1956/05/18   Cancelled Treatment:    Reason Eval/Treat Not Completed: Other (comment) Per RN, pt is transitioning to full comfort measures. OT will sign off at this time. Thank you for this consult.    Zenovia Jarred, MSOT, OTR/L Acute Rehabilitation Services [facility] Office Number: [phone] Pager: [phone]  Zenovia Jarred 11/05/2019, 9:14 AM

## 2019-11-05 NOTE — Progress Notes (Signed)
Called pt brother [name] to inform of pt transfer to [phone]. Use of unit tele camera explained and offered to [facility]. [name] declined use and verbalized plan to visit pt in am, unit phone number for 6N provided to [facility].

## 2019-11-05 NOTE — Progress Notes (Signed)
Responded to Spiritual Care Consult of End of Life for Drew Nguyen.  Met with his family when they arrived in his room.  Family shared stories of how good Drew Nguyen has been to them, how he took them in and loved them.  After a time of sharing Chaplain read Scripture, provided prayer and words of Commendation.  Invited family to reach out to Chaplain if they needed additional spiritual care.    Chaplain Resident 

## 2019-11-05 NOTE — Progress Notes (Signed)
eLink Physician-Brief Progress Note Patient Name: ANANTH FIALLOS DOB: 06-May-1956 MRN: [id number]   Date of Service  11/05/2019  HPI/Events of Note  Abnormal labs likely sampling error  eICU Interventions  Bedside RN asked to re-draw blood for labs with appropriate precautions since he was drawing from a PICC line.        Frederik Pear 11/05/2019, 6:53 AM

## 2019-11-05 NOTE — Progress Notes (Signed)
NAME:  Drew Nguyen, MRN:  [id number], DOB:  [birth year], LOS: 98 ADMISSION DATE:  10/19/2019, CONSULTATION DATE:  10/17/19 REFERRING MD:  [place], CHIEF COMPLAINT:  Lung mass    Brief History   64 yo man admitted with encephalopathy found to have left upper lobe lung mass.  Initial PCCM consult was 2/26 for lung mass.  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  Encephalopathy - suspect paraneoplastic process from [place] but LP was never done.  PCCM + after diagnosis.  Has been getting inpatient chemotherapy and radiation at [facility]. Had massive hemoptysis on the floor at [facility] and transferred back to [name] hospital 3/13 evening.  PCCM reconsulted for hemoptysis respiratory failure.  Consults:  CCM, Oncology  Procedures:  Bronchoscopy with EBUS 3/2 Intubation 3/13 Bronchoscopy 3/13 Bronchoscopy 3/14 Bronchoscopy 3/16  Significant Diagnostic Tests:  CT angio chest 3/14 The debris within the trachea and mainstem bronchi corresponds to hemorrhage. Some of the patchy opacities in the lungs bilaterally may reflect hemorrhage rather than infection, particularly in the superior segment left lower lobe (for example, series 8/image 31). However, when closely evaluating this region, it still appears to have a pulmonary arterial/venous supply. An aberrant bronchial artery source is not definitely identified.  Prior study also noted narrowing of a left upper lobe pulmonary artery anteriorly. This is improved on the current study, presumably following chemotherapy.   Micro Data:  3/13 MRSA PCR negative 3/13 Covid negative  Antimicrobials:   vancomycin 3/14>>>3/15 cefepime 3/14>>>  Interim history/subjective:  Extubated following 2 hour SBT yesterday.  Increasing respiratory distress overnight with audible stridor.   Objective   Blood pressure (!) 164/67, pulse 89, temperature 97.7 F (36.5 C), temperature source Oral, resp. rate (!) 24,  height 5\' 6"  (1.676 m), weight 61.4 kg, SpO2 (!) 85 %.    Vent Mode: CPAP;PSV FiO2 (%):  [50 %-60 %] 50 % PEEP:  [5 cmH20] 5 cmH20 Pressure Support:  [5 cmH20] 5 cmH20   Intake/Output Summary (Last 24 hours) at 11/05/2019 0825 Last data filed at 11/05/2019 0600 Gross per 24 hour  Intake 2129.38 ml  Output 3025 ml  Net -895.62 ml   Filed Weights   11/02/19 0000 11/04/19 0500 11/05/19 0442  Weight: 62 kg 66.7 kg 61.4 kg    Examination: General: chronically ill appearing male, moderate respiratory distress HENT: NRB, mm dry, Lungs: audible stridor, moderate accessory muscle use. Chest clear with decreased air entry to left side. Cardiovascular: Regular rate and rhythm, extremities warm.  No JVD. Abdomen: Soft, nontender Extremities: No edema Neuro: moving in bed with no focal deficits but not responding to verbal prompts. MSK: skin warm and dry  Lines: PICC line   Assessment & Plan:    Critically ill due to acute hypoxic hypercapnic respiratory failure with obstructing left lung tumor.  Now failing extubation with no viable exit strategy. Positive pressure was probably necessary to keep airway tented open.  Large tumor burden, goal of XRT and chemotherapy was always clearly stated as palliative from the outset.  At this point not able to tolerate further cancer treatment without imposing further discomfort from aggressive life support thus defeating stated goal.  Have discussed the patient's condition with his brother who was prepared for this eventuality.  I have told him that any further aggressive treatment would only contribute to prolonging discomfort and suffering.  He has agreed to a transition to comfort care.  We will continue high-flow oxygen in  an attempt to temporize will family gathers to see him.   Labs   CBC: Recent Labs  Lab 11/01/19 1907 11/01/19 2347 10/31/2019 0528 11/13/2019 1825 11/04/19 0503 11/05/19 0523 11/05/19 0701  WBC 12.8*   < > 7.4 3.0* 1.3*  0.5* 0.5*  NEUTROABS 10.4*  --   --   --  0.7* 0.3* PENDING  HGB 9.5*   < > 7.8* 7.9* 7.1* 6.7* 6.7*  HCT 28.3*   < > 23.6* 25.0* 22.4* 20.4* 20.1*  MCV 96.6   < > 96.7 98.0 99.1 93.6 95.3  PLT 158   < > 129* 144* 122* 89* 88*   < > = values in this interval not displayed.    Basic Metabolic Panel: Recent Labs  Lab 11/07/2019 0528 11/12/2019 0528 11/05/2019 1825 11/13/2019 2155 11/04/19 0503 11/04/19 1632 11/05/19 0523 11/05/19 0701  NA 145  --   --  141 144  --  147* 144  K 4.0  --   --  4.8 4.3  --  <2.0* 2.1*  CL 108  --   --  109 112*  --  106 104  CO2 27  --   --  23 24  --  29 28  GLUCOSE 152*  --   --  264* 286*  --  115* 128*  BUN 29*  --   --  34* 38*  --  16 16  CREATININE 0.61  --   --  0.72 0.84  --  0.41* 0.53*  CALCIUM 7.3*  --   --  7.2* 7.2*  --  7.6* 7.5*  MG 2.2   < > 2.0  --  1.9 1.8 1.8 1.8  PHOS 3.1   < > 4.2  --  3.4 1.7* 1.6* 1.7*   < > = values in this interval not displayed.   GFR: Estimated Creatinine Clearance: 81 mL/min (A) (by C-G formula based on SCr of 0.53 mg/dL (L)). Recent Labs  Lab 11/05/2019 1825 11/04/19 0503 11/05/19 0523 11/05/19 0701  WBC 3.0* 1.3* 0.5* 0.5*    Liver Function Tests: Recent Labs  Lab 10/31/19 0500 11/01/19 2347 11/04/19 0503 11/05/19 0523 11/05/19 0701  AST 101* 73* 42* 54* 57*  ALT 266* 194* 132* 123* 122*  ALKPHOS 337* 314* 191* 180* 172*  BILITOT 0.8 1.2 1.0 1.5* 1.6*  PROT 5.1* 4.7* 4.5* 5.0* 4.9*  ALBUMIN 2.3* 2.1* 2.0* 2.0* 2.1*   No results for input(s): LIPASE, AMYLASE in the last 168 hours. No results for input(s): AMMONIA in the last 168 hours.  ABG    Component Value Date/Time   PHART 7.410 11/02/2019 0508   PCO2ART 46.2 11/02/2019 0508   PO2ART 68.0 (L) 11/02/2019 0508   HCO3 29.3 (H) 11/02/2019 0508   TCO2 31 11/02/2019 0508   O2SAT 93.0 11/02/2019 0508     Coagulation Profile: Recent Labs  Lab 10/30/2019 0528  INR 1.0    Cardiac Enzymes: No results for input(s): CKTOTAL, CKMB,  CKMBINDEX, TROPONINI in the last 168 hours.  HbA1C: Hgb A1c MFr Bld  Date/Time Value Ref Range Status  11/04/2019 08:46 AM 6.5 (H) 4.8 - 5.6 % Final    Comment:    (NOTE) Pre diabetes:          5.7%-6.4% Diabetes:              >6.4% Glycemic control for   <7.0% adults with diabetes   06/06/2019 03:27 AM 5.4 4.8 - 5.6 % Final    Comment:    (  NOTE) Pre diabetes:          5.7%-6.4% Diabetes:              >6.4% Glycemic control for   <7.0% adults with diabetes     CBG: Recent Labs  Lab 11/04/19 1525 11/04/19 1929 11/04/19 2348 11/05/19 0410 11/05/19 [place], MD [facility] ICU Physician [place]  Pager: [phone] Mobile: [phone] After hours: [phone].  11/05/2019, 8:38 AM      11/05/2019  8:25 AM

## 2019-11-05 NOTE — Progress Notes (Signed)
Called report to staff nurse 6N. Pt calm sedated squints eyes open to voice follows no commands, rr regular and unlabored, skin w/d, foley with clear yellow urine. Sbp 200 prior to transfer, hydralazine and scheduled ativan given. HFNC changed to nasal 2 liters [place] upon transfer.  Morphine 6mg /hr infusion via rt arm  picc line, transferred pt to 6N23.

## 2019-11-05 NOTE — Progress Notes (Signed)
I reviewed the critical care notes, including discussion with the patient's brother [name], regarding goals of care and transitioning toward comfort care.  I had a very lengthy discussion with the patient's brother over the phone regarding the metastatic small cell lung cancer.  I reinforced the concept that the prognosis from metastatic small cell carcinoma is very poor, and the patient has received aggressive treatment up to this point, including chemotherapy and palliative radiation.  Unfortunately, his cancer has not shown significant response, and despite aggressive care, his clinical condition has continued to deteriorate.  In light of the patient's progressive decline, I informed the patient's brother that I was in agreement with transitioning care toward comfort care, as any further treatment would likely cause more harm some benefits.  The patient's brother expressed understanding, and was in agreement with the plan.  He appreciated the phone call and honest discussion.  He did not have any further questions.  Thank you for caring for our mutual patient it is difficult situation.  Please do not hesitate to contact us if there are any questions.  Tish Men, MD 11/05/2019 1:19 PM

## 2019-11-05 NOTE — Progress Notes (Signed)
Palliative:  HPI: 64 yo male with PMH stroke x 2, rheumatoid arthritis, COPD, depression, poly substance abuse admitted 09/25/2019 with tonic clonic seizure potentially related to alcohol withdrawal. Hospitalization complicated by findings of small cell lung cancer with mets to liver. Required intubation, bronchoscopy, hemoptysis, chemotherapy/radiation. He continues to decline with no improvement during prolonged hospitalization. Overall prognosis very poor.   I met today at Quante's bedside. He is extubated but on nasal cannula with non-rebreather. He is alert and very anxious. He is already on morphine infusion but he responded very well to lorazepam and was resting peacefully.   I called and spoke with brother, [name], who is his Air traffic controller. [name] shares that he has had conversations and understands that Denis's prognosis is very poor and he agrees that at this stage our focus should be to ensure that Remus is comfortable. He understands prognosis is poor and could be hours to days most likely. They have one other brother, Zenia Resides, that may come to visit and Jabriel's significant other is planning to be with him. They have 3 children together ages 19, 54, 60 years old.   All questions/concerns addressed. Emotional support provided.   Exam: Anxious - relieved with lorazepam. Breathing labored but likely from anxiety. HR 80-90s. Abd soft.   Plan: - Full comfort care.  - Medications/orders adjusted to ensure comfort.   [place], NP Palliative Medicine Team Pager [phone] (Please see [email or web address] for schedule) Team Phone [phone]    Greater than 50%  of this time was spent counseling and coordinating care related to the above assessment and plan

## 2019-11-05 NOTE — Progress Notes (Signed)
Speech Language Pathology Discharge Patient Details Name: Drew Nguyen MRN: [id number] DOB: [birth year] Today's Date: 11/05/2019 Time:  -     Patient discharged from SLP services secondary to medical decline - will need to re-order SLP to resume therapy services. Comfort care measures initiated  Please see latest therapy progress note for current level of functioning and progress toward goals.    Progress and discharge plan discussed with patient and/or caregiver: Patient unable to participate in discharge planning and no caregivers available  GO     [place] Siren 11/05/2019, 8:41 AM   [name] Colvin Caroli.Ed Risk analyst [phone] Office [phone]

## 2019-11-06 ENCOUNTER — Ambulatory Visit: Payer: Medicaid Other

## 2019-11-06 LAB — PATHOLOGIST SMEAR REVIEW

## 2019-11-06 NOTE — Social Work (Signed)
CSW aware of pt transfer to [facility], TOC available as needed.  Please consult for disposition should hospice services or residential hospice become appropriate.   Alexander Mt, MSW, [place] Work

## 2019-11-06 NOTE — Progress Notes (Signed)
Nutrition Brief Note  Chart reviewed. Pt now transitioning to comfort care.  No further nutrition interventions warranted at this time.  Please re-consult as needed.   Stormie Ventola W, RD, LDN, CDCES Registered Dietitian II Certified Diabetes Care and Education Specialist Please refer to AMION for RD and/or RD on-call/weekend/after hours pager  

## 2019-11-06 NOTE — Progress Notes (Signed)
CCM Note  S:  Seen in f/u for terminal cancer. Comfortable, unresponsive on morphine gtt.  O: Today's Vitals   11/05/19 2040 11/05/19 2047 11/05/19 2055 11/05/19 2143  BP: (!) 224/100 (!) 215/89 (!) 215/89 (!) 194/75  Pulse: 87 91  91  Resp: 10 14  17   Temp:    97.8 F (36.6 C)  TempSrc:    Oral  SpO2: 98% 97%  90%  Weight:      Height:      PainSc:    0-No pain   Body mass index is 21.85 kg/m. Ill man unresponsive MM dry Lungs absent on L, rhonci on R Ext no edema  A: Terminal small cell lung cancer with invasion of airways  P: Continue morphine drip titrated to comfort Called and updated brother Appreciate [facility] taking over care starting tomorrow as patient may survive several days  Erskine Emery MD PCCM

## 2019-11-06 NOTE — Progress Notes (Signed)
Wasted 23ml Morphine with Francesco Runner

## 2019-11-06 NOTE — Progress Notes (Signed)
Bernie RN aware that Morphine infusion stopped due to bag and tubing expired.  Bernie RN has ordered new bag.  Cap changed on PICC line.

## 2019-11-06 NOTE — Progress Notes (Signed)
Palliative:  HPI: 64 yo male with PMH stroke x 2, rheumatoid arthritis, COPD, depression, poly substance abuse admitted 10/18/2019 with tonic clonic seizure potentially related to alcohol withdrawal. Hospitalization complicated by findings of small cell lung cancer with mets to liver. Required intubation, bronchoscopy, hemoptysis, chemotherapy/radiation. He continues to decline with no improvement during prolonged hospitalization. Overall prognosis very poor.    Drew Nguyen is resting comfortable. Bedside RN assisting to restart morphine infusion to ensure comfort. Unresponsive and breathing irregular with apneic episodes noted. No family at bedside. Prognosis poor hours to days. Anticipate hospital death.   Exam: Unresponsive. Apnea noted. Breathing irregular.  Plan:  - Full comfort care.  - Anticipate hospital death.  - No changes to medication regimen to ensure comfort. Please call for discomfort and need for medication changes to achieve comfort.   15 min  Vinie Sill, NP Palliative Medicine Team Pager [phone] (Please see [email or web address] for schedule) Team Phone [phone]    Greater than 50%  of this time was spent counseling and coordinating care related to the above assessment and plan

## 2019-11-07 ENCOUNTER — Ambulatory Visit: Payer: Medicaid Other

## 2019-11-07 NOTE — Progress Notes (Signed)
PROGRESS NOTE    Drew Nguyen  JOI:[id number] DOB: 03/16/1956 DOA: 09/28/2019 PCP: Boyce Medici, FNP   Brief Narrative: 64 year old male with past medical history of stroke x2, COPD, depression, rheumatoid arthritis, polysubstance abuse admitted on 09/24/2019 with tonic-clonic seizure suspected to be due to alcohol withdrawal.  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  He needed intubation, bronchoscopy had chemotherapy/radiation and was a long hospital, had massive hematemesis on the floor and transferred to [facility] 3/13.  despite aggressive measures he continued to decline with no improvement during prolonged hospitalization palliative care was consulted felt that he had overall very poor prognosis.  Patient also seen by oncologist, patient's brother was involved in the decision making unfortunately cancer has not shown significant response despite aggressive care and oncology has advised comfort measures along with PCCM and palliative care.  Subjective: Remains on morphine drip, comfortable, unresponsive.  Nursing tech at the bedside.   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  Now on comfort measures. Generalized tonic-clonic seizure suspect to be due to alcohol withdrawal Acute hypoxic respiratory failure with obstructive left lung tumor Massive hemoptysis due to lung cancer Pancytopenia in the setting of chemo and radiation and lung cancer. Acute metabolic encephalopathy/hepatic encephalopathy with hepatic metastasis/elevated LFTs COPD GOLD I, still smoking Rheumatoid arthritis  History of recurrent CVA  Dysphagia/Esophageal thickening seen by GI Essential  hypertension Hyponatremia Hilar adenopathy DNR Encephalopathy Leukocytosis Hyponatremia Hypertension with hypertensive urgency Hypokalemia  PLAN: Patient has been transferred from ICU to medical floor for ongoing comfort measures on morphine drip and supportive care. Continue current plan of care. Strongly poor prognosis. Appears comfortable.  Nutrition: Diet Order            Diet NPO time specified  Diet effective now              Nutrition Problem: Inadequate oral intake Etiology: inability to eat Signs/Symptoms: NPO status Interventions: Tube feeding Body mass index is 21.85 kg/m.   DVT prophylaxis:none Code Status: DNR Family Communication: Patient's brother was updated previously by the ICU palliative oncology team.   Disposition Plan: Patient is from:home Anticipated Disposition: TBD Barriers to discharge or conditions that needs to be met prior to discharge: Patient admitted with complex and prolonged hospitalization currently on comfort measures for metastatic lung cancer, anticipating hospital death.  Consult:CCM, Oncology, palliative care  Procedures:  Bronchoscopy with EBUS 3/2 Intubation 3/13 Bronchoscopy 3/13 Bronchoscopy 3/14 Bronchoscopy 3/16  Significant Diagnostic Tests:  CT angio chest 3/14 The debris within the trachea and mainstem bronchi corresponds to hemorrhage. Some of the patchy opacities in the lungs bilaterally may reflect hemorrhage rather than infection, particularly in the superior segment left lower lobe (for example, series 8/image 31). However, when closely evaluating this region, it still appears to have a pulmonary arterial/venous supply. An aberrant bronchial artery source is not definitely identified.  Prior study also noted narrowing of a left upper lobe pulmonary artery anteriorly. This is improved on the current study, presumably following chemotherapy.   Micro Data:  3/13 MRSA PCR negative 3/13 Covid  negative  Procedures:see note Microbiology:see note   Medications: Scheduled Meds: . chlorhexidine  15 mL Mouth Rinse BID  . Chlorhexidine Gluconate Cloth  6  each Topical Daily  . glycopyrrolate  0.2 mg Intravenous TID  . lidocaine  15 mL Mouth/Throat TID AC  . LORazepam  2 mg Intravenous Q4H  . mouth rinse  15 mL Mouth Rinse q12n4p  . sodium chloride flush  10-40 mL Intracatheter Q12H   Continuous Infusions: . sodium chloride    . sodium chloride    . sodium chloride 8 mL/hr at 11/05/19 2330  . famotidine (PEPCID) IV (ONCOLOGY)    . morphine 1 mg/hr (11/07/19 0855)    Antimicrobials: Anti-infectives (From admission, onward)   Start     Dose/Rate Route Frequency Ordered Stop   11/02/19 0615  vancomycin (VANCOCIN) IVPB 1000 mg/200 mL premix  Status:  Discontinued     1,000 mg 200 mL/hr over 60 Minutes Intravenous Every 12 hours 11/02/19 0601 11/02/19 0924   11/02/19 0600  ceFEPIme (MAXIPIME) 2 g in sodium chloride 0.9 % 100 mL IVPB  Status:  Discontinued     2 g 200 mL/hr over 30 Minutes Intravenous Every 8 hours 11/02/19 0559 11/05/19 1152   10/31/19 1500  fluconazole (DIFLUCAN) IVPB 100 mg  Status:  Discontinued     100 mg 50 mL/hr over 60 Minutes Intravenous Daily 10/31/19 1400 11/13/2019 0916   10/24/19 1645  fluconazole (DIFLUCAN) IVPB 200 mg  Status:  Discontinued     200 mg 100 mL/hr over 60 Minutes Intravenous Every 24 hours 10/24/19 1639 10/24/19 1720   10/17/19 0200  doxycycline (VIBRAMYCIN) 100 mg in sodium chloride 0.9 % 250 mL IVPB  Status:  Discontinued     100 mg 125 mL/hr over 120 Minutes Intravenous 2 times daily 10/17/19 0111 10/31/2019 0817       Objective: Vitals: Today's Vitals   11/05/19 2055 11/05/19 2143 11/06/19 2305 11/07/19 0514  BP: (!) 215/89 (!) 194/75  (!) 180/84  Pulse:  91  (!) 111  Resp:  17  (!) 24  Temp:  97.8 F (36.6 C)  (!) 100.7 F (38.2 C)  TempSrc:  Oral  Oral  SpO2:  90% 98% 98%  Weight:      Height:      PainSc:  0-No  pain      Intake/Output Summary (Last 24 hours) at 11/07/2019 1217 Last data filed at 11/07/2019 0935 Gross per 24 hour  Intake 0 ml  Output 1050 ml  Net -1050 ml   Filed Weights   11/02/19 0000 11/04/19 0500 11/05/19 0442  Weight: 62 kg 66.7 kg 61.4 kg   Weight change:    Intake/Output from previous day: 03/18 0701 - 03/19 0700 In: 0  Out: 350 [Urine:350] Intake/Output this shift: Total I/O In: 0  Out: 700 [Urine:700]  Examination:  General exam: Unresponsive, breathing comfortably.   HEENT:Oral mucosa moist Respiratory system: bilaterally diminished breath sound Cardiovascular system: S1 & S2 +, regular Gastrointestinal system: Exam deferred for comfort. Nervous System: Unresponsive further exam deferred for comfort  Extremities: No edema Skin: No rashes,no icterus. MSK: Thin muscle bulk,tone.  Data Reviewed: I have personally reviewed following labs and imaging studies CBC: Recent Labs  Lab 11/01/19 1907 11/01/19 2347 11/09/2019 0528 11/05/2019 1825 11/04/19 0503 11/05/19 0523 11/05/19 0701  WBC 12.8*   < > 7.4 3.0* 1.3* 0.5* 0.5*  NEUTROABS 10.4*  --   --   --  0.7* 0.3* 0.3*  HGB 9.5*   < > 7.8* 7.9* 7.1* 6.7* 6.7*  HCT 28.3*   < > 23.6* 25.0* 22.4* 20.4* 20.1*  MCV 96.6   < >  96.7 98.0 99.1 93.6 95.3  PLT 158   < > 129* 144* 122* 89* 88*   < > = values in this interval not displayed.   Basic Metabolic Panel: Recent Labs  Lab 11/04/2019 0528 11/01/2019 0528 11/15/2019 1825 11/12/2019 2155 11/04/19 0503 11/04/19 1632 11/05/19 0523 11/05/19 0701  NA 145  --   --  141 144  --  147* 144  K 4.0  --   --  4.8 4.3  --  <2.0* 2.1*  CL 108  --   --  109 112*  --  106 104  CO2 27  --   --  23 24  --  29 28  GLUCOSE 152*  --   --  264* 286*  --  115* 128*  BUN 29*  --   --  34* 38*  --  16 16  CREATININE 0.61  --   --  0.72 0.84  --  0.41* 0.53*  CALCIUM 7.3*  --   --  7.2* 7.2*  --  7.6* 7.5*  MG 2.2   < > 2.0  --  1.9 1.8 1.8 1.8  PHOS 3.1   < > 4.2  --  3.4  1.7* 1.6* 1.7*   < > = values in this interval not displayed.   GFR: Estimated Creatinine Clearance: 81 mL/min (A) (by C-G formula based on SCr of 0.53 mg/dL (L)). Liver Function Tests: Recent Labs  Lab 11/01/19 2347 11/04/19 0503 11/05/19 0523 11/05/19 0701  AST 73* 42* 54* 57*  ALT 194* 132* 123* 122*  ALKPHOS 314* 191* 180* 172*  BILITOT 1.2 1.0 1.5* 1.6*  PROT 4.7* 4.5* 5.0* 4.9*  ALBUMIN 2.1* 2.0* 2.0* 2.1*   No results for input(s): LIPASE, AMYLASE in the last 168 hours. No results for input(s): AMMONIA in the last 168 hours. Coagulation Profile: Recent Labs  Lab 11/17/2019 0528  INR 1.0   Cardiac Enzymes: No results for input(s): CKTOTAL, CKMB, CKMBINDEX, TROPONINI in the last 168 hours. BNP (last 3 results) No results for input(s): PROBNP in the last 8760 hours. HbA1C: No results for input(s): HGBA1C in the last 72 hours. CBG: Recent Labs  Lab 11/04/19 1929 11/04/19 2348 11/05/19 0410 11/05/19 0733 11/05/19 1110  GLUCAP 119* 145* 108* 125* 137*   Lipid Profile: Recent Labs    11/05/19 0523  TRIG 113   Thyroid Function Tests: No results for input(s): TSH, T4TOTAL, FREET4, T3FREE, THYROIDAB in the last 72 hours. Anemia Panel: No results for input(s): VITAMINB12, FOLATE, FERRITIN, TIBC, IRON, RETICCTPCT in the last 72 hours. Sepsis Labs: No results for input(s): PROCALCITON, LATICACIDVEN in the last 168 hours.  Recent Results (from the past 240 hour(s))  SARS CORONAVIRUS 2 (TAT 6-24 HRS) Nasopharyngeal Nasopharyngeal Swab     Status: None   Collection Time: 10/31/19  4:28 PM   Specimen: Nasopharyngeal Swab  Result Value Ref Range Status   SARS Coronavirus 2 NEGATIVE NEGATIVE Final    Comment: (NOTE) SARS-CoV-2 target nucleic acids are NOT DETECTED. The SARS-CoV-2 RNA is generally detectable in upper and lower respiratory specimens during the acute phase of infection. Negative results do not preclude SARS-CoV-2 infection, do not rule  out co-infections with other pathogens, and should not be used as the sole basis for treatment or other patient management decisions. Negative results must be combined with clinical observations, patient history, and epidemiological information. The expected result is Negative. Fact Sheet for Patients: SugarRoll.be Fact Sheet for Healthcare Providers: https://www.woods-mathews.com/ This test is not  yet approved or cleared by the [place] and  has been authorized for detection and/or diagnosis of SARS-CoV-2 by FDA under an Emergency Use Authorization (EUA). This EUA will remain  in effect (meaning this test can be used) for the duration of the COVID-19 declaration under Section 56 4(b)(1) of the Act, 21 U.S.C. section 360bbb-3(b)(1), unless the authorization is terminated or revoked sooner. Performed at [facility], [place] [address], [place], [place] [address]   MRSA PCR Screening     Status: None   Collection Time: 11/01/19  7:52 PM   Specimen: Nasal Mucosa; Nasopharyngeal  Result Value Ref Range Status   MRSA by PCR NEGATIVE NEGATIVE Final    Comment:        The GeneXpert MRSA Assay (FDA approved for NASAL specimens only), is one component of a comprehensive MRSA colonization surveillance program. It is not intended to diagnose MRSA infection nor to guide or monitor treatment for MRSA infections. Performed at [facility], [place] [address], [place], [place] [address]   Surgical PCR screen     Status: None   Collection Time: 11/11/2019 11:15 AM   Specimen: Nasal Mucosa; Nasal Swab  Result Value Ref Range Status   MRSA, PCR NEGATIVE NEGATIVE Final   Staphylococcus aureus NEGATIVE NEGATIVE Final    Comment: (NOTE) The Xpert SA Assay (FDA approved for NASAL specimens in patients 28 years of age and older), is one component of a comprehensive surveillance program. It is not intended to diagnose infection  nor to guide or monitor treatment. Performed at [facility], [place] [address], [place], [place] [address]       Radiology Studies: No results found.   LOS: 21 days   Time spent: More than 50% of that time was spent in counseling and/or coordination of care.  Antonieta Pert, MD [facility]  11/07/2019, 12:17 PM

## 2019-11-10 ENCOUNTER — Ambulatory Visit: Payer: Medicaid Other

## 2019-11-11 ENCOUNTER — Ambulatory Visit: Payer: Medicaid Other

## 2019-11-12 ENCOUNTER — Ambulatory Visit: Payer: Medicaid Other

## 2019-11-13 ENCOUNTER — Ambulatory Visit: Payer: Medicaid Other

## 2019-11-14 ENCOUNTER — Inpatient Hospital Stay: Payer: Medicaid Other | Admitting: Internal Medicine

## 2019-11-14 ENCOUNTER — Ambulatory Visit: Payer: Medicaid Other

## 2019-11-17 ENCOUNTER — Ambulatory Visit: Payer: Medicaid Other

## 2019-11-18 ENCOUNTER — Ambulatory Visit: Payer: Medicaid Other

## 2019-11-19 ENCOUNTER — Ambulatory Visit: Payer: Medicaid Other

## 2019-11-20 ENCOUNTER — Ambulatory Visit: Payer: Medicaid Other

## 2019-11-20 NOTE — Progress Notes (Signed)
Wasted 10 cc morphine at stericycle with Precious Bard, RN.

## 2019-11-20 NOTE — Progress Notes (Signed)
Upon RN entry at 1233, patient found expired. 2 RN verify and MD notified. MD notified family.

## 2019-11-20 NOTE — Discharge Summary (Signed)
Physician Discharge Summary  Drew Nguyen WJX:[id number] DOB: 05/29/1956 DOA: 10/10/2019  PCP: Boyce Medici, FNP  Admit date: 10/08/2019 Discharge date: 11-15-2019  Admitted From: Inpatient Disposition: EXPIRED    Brief/Interim Summary: 64 year old male with past medical history of stroke x2, COPD, depression, rheumatoid arthritis, polysubstance abuse admitted on 10/01/2019 with tonic-clonic seizure suspected to be due to alcohol withdrawal.  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  He needed intubation, bronchoscopy had chemotherapy/radiation and was a long hospital, had massive hematemesis on the floor and transferred to [facility] 3/13.  despite aggressive measures he continued to decline with no improvement during prolonged hospitalization palliative care was consulted felt that he had overall very poor prognosis.  Patient also seen by oncologist, patient's brother was involved in the decision making unfortunately cancer has not shown significant response despite aggressive care and oncology has advised comfort measures along with PCCM and palliative care.  Pt expired Nov 15, 2019  Discharge Diagnoses:  Principal Problem:   Seizure ([place]) Active Problems:   COPD GOLD I, still smoking   Rheumatoid arthritis ([place])   CVA (cerebral vascular accident) ([place])   Essential hypertension   Hyponatremia   Small cell lung cancer, left ([place])   Hilar adenopathy   Abnormal CT scan, esophagus   Small cell lung cancer in adult [facility])   Palliative care encounter   Encephalopathy   Leukocytosis   Massive hemoptysis    Discharge Instructions     Contact information for follow-up providers    [facility].   Specialty: Family Medicine Contact information: [address] [place] [address] [phone]            Contact information for after-discharge care    [facility] .   Service: Skilled Nursing Contact information: [place] [address] [phone]                 Allergies  Allergen Reactions  . Celexa [Citalopram] Other (See Comments)    Over sedation Sleeps too much    Consultations:  PCCM   Procedures/Studies: EEG  Result Date: 10/17/2019 Lora Havens, MD     10/17/2019  1:38 PM Patient Name: Drew Nguyen MRN: [id number] Epilepsy Attending: Lora Havens Referring Physician/Provider: Dr. Gean Birchwood Date: 10/17/2019 Duration: 25.26 minutes Patient history: 64 year old male with history of alcohol use, cocaine use who presented with generalized tonic-clonic seizure-like episode.  EEG to evaluate for seizures. Level of alertness: Awake AEDs during EEG study: Ativan Technical aspects: This EEG study was done with scalp electrodes positioned according to the 10-20 International system of electrode placement. Electrical activity was acquired at a sampling rate of 500Hz  and reviewed with a high frequency filter of 70Hz  and a low frequency filter of 1Hz . EEG data were recorded continuously and digitally stored. Description: The posterior dominant rhythm consists of 9-10 Hz activity of moderate voltage (25-35 uV) seen predominantly in posterior head regions, symmetric and reactive to eye opening and eye closing.  There is also 15 to 18 Hz beta activity  distributed symmetrically and diffusely.  Physiologic photic driving was seen during photic stimulation.  Hyperventilation was not performed. IMPRESSION: This study is within normal limits. No seizures or epileptiform discharges  were seen throughout the recording. Lora Havens   DG Chest 2 View  Result Date: 10/25/2019 CLINICAL DATA:  Cough. Right-sided chest pain. Shortness of breath. EXAM: CHEST - 2 VIEW  COMPARISON:  Radiograph CT 01/08/2020. Chest CT 10/17/2019 FINDINGS: Progressive volume loss in the left hemithorax with progressive opacification throughout, patient with known left hilar mass. This is likely increasing postobstructive atelectasis throughout the left lung. Difficult to exclude an element of pleural effusion. Right lung is slightly hyperinflated but clear. Cardiomediastinal contours are obscured by left lung opacity. No evidence of right pulmonary edema. No visualized pneumothorax. IMPRESSION: Progressive volume loss in the left hemithorax with increased opacification, likely increasing postobstructive atelectasis throughout the left lung given history of left hilar mass and upper lobe collapse on recent CT. Difficult to exclude an element of pleural fluid on the left. Electronically Signed   By: Keith Rake M.D.   On: 10/25/2019 19:26   DG Abd 1 View  Result Date: 10/26/2019 CLINICAL DATA:  NG tube advanced EXAM: ABDOMEN - 1 VIEW COMPARISON:  Radiograph 01/03/2020 312 hours FINDINGS: NG tube is been advanced slightly such that the side port is at the GE junction advancement by approximately 4 cm. Complete opacification of the LEFT lower lobe. IMPRESSION: 1. Advancement of NG tube by several cm such that the side port at the GE junction. 2. Dense opacification LEFT lower lobe. Electronically Signed   By: Suzy Bouchard M.D.   On: 11/13/2019 16:20   DG Abd 1 View  Result Date: 10/24/2019 CLINICAL DATA:  Tube placement EXAM: ABDOMEN - 1 VIEW COMPARISON:  None FINDINGS: Enteric tube passes into the stomach. Side port remains within the distal esophagus. Bowel gas pattern is unremarkable on this view. IMPRESSION: Enteric tube terminates within the stomach with side port within the distal esophagus. Consider advancing for more optimal positioning. Electronically Signed   By: Macy Mis M.D.   On: 11/18/2019 15:27   CT HEAD WO CONTRAST  Result Date: 10/25/2019 CLINICAL DATA:  Slurred  speech, history of CVAs, neuro deficit, acute, stroke suspected. EXAM: CT HEAD WITHOUT CONTRAST TECHNIQUE: Contiguous axial images were obtained from the base of the skull through the vertex without intravenous contrast. COMPARISON:  Brain MRI 10/17/2019, head CT 09/24/2019. FINDINGS: Brain: There is no evidence of acute intracranial hemorrhage, intracranial mass, midline shift or extra-axial fluid collection.No demarcated cortical infarction. Redemonstrated chronic lacunar infarcts within the bilateral corona radiata/basal ganglia. Background ill-defined hypoattenuation within the cerebral white matter is nonspecific, but consistent with chronic small vessel ischemic disease. Stable, mild generalized parenchymal atrophy. Vascular: No hyperdense vessel.  Atherosclerotic calcifications. Skull: Normal. Negative for fracture or focal lesion. Sinuses/Orbits: Visualized orbits demonstrate no acute abnormality. No significant paranasal sinus disease or mastoid effusion at the imaged levels. IMPRESSION: No evidence of acute intracranial abnormality. Redemonstrated chronic bilateral corona radiata/basal ganglia lacunar infarcts. Stable background generalized parenchymal atrophy and chronic small vessel ischemic disease. Electronically Signed   By: Kellie Simmering DO   On: 10/25/2019 18:33   CT Head Wo Contrast  Result Date: 09/29/2019 CLINICAL DATA:  Altered level of consciousness, seizure EXAM: CT HEAD WITHOUT CONTRAST TECHNIQUE: Contiguous axial images were obtained from the base of the skull through the vertex without intravenous contrast. COMPARISON:  06/05/2019, 06/06/2019 FINDINGS: Brain: There are chronic small vessel ischemic changes seen within the bilateral basal ganglia and periventricular white matter. No acute infarct or hemorrhage. Lateral ventricles and remaining midline structures are unremarkable. No acute extra-axial fluid collections. No mass effect.  Vascular: No hyperdense vessel or unexpected  calcification. Skull: Normal. Negative for fracture or focal lesion. Sinuses/Orbits: No acute finding. Other: None IMPRESSION: 1. Chronic small vessel ischemic changes, no acute intracranial process. Electronically Signed   By: Randa Ngo M.D.   On: 10/18/2019 22:54   CT CHEST W CONTRAST  Result Date: 10/17/2019 CLINICAL DATA:  Abnormal chest x-ray, pulmonary nodule, seizure, altered level of consciousness EXAM: CT CHEST WITH CONTRAST TECHNIQUE: Multidetector CT imaging of the chest was performed during intravenous contrast administration. CONTRAST:  9mL OMNIPAQUE IOHEXOL 300 MG/ML  SOLN COMPARISON:  10/10/2019, 03/04/2019 FINDINGS: Cardiovascular: The heart is unremarkable without pericardial effusion. Thoracic aorta is normal in caliber without aneurysm or dissection. No filling defects or pulmonary emboli. Extrinsic compression upon the left pulmonary artery due to left hilar mass. Moderate atherosclerosis of the coronary vasculature, with mild calcified plaque of the aortic arch. Mediastinum/Nodes: There is a lobular soft tissue mass in the mediastinum and AP window, measuring 4.7 x 5.3 cm. Large left hilar mass with obstruction of the left upper lobe bronchus. No contralateral hilar adenopathy. There is circumferential wall thickening of the distal thoracic esophagus, nonspecific. Endoscopy may be useful. Lungs/Pleura: There is complete collapse of the left upper lobe due to a large left hilar mass causing abrupt cut off of the left upper lobe bronchus. Distinguishing the left hilar mass from the consolidated left upper lobe is difficult. PET scan recommended for further evaluation. Background emphysema. Minimal tree in bud ground-glass airspace disease within the left lower lobe likely inflammatory or infectious. There is narrowing of the left lower lobe bronchus with mucoid material filling the posterior basilar segmental bronchi. No effusion or pneumothorax. Upper Abdomen: Thickening of the left  adrenal gland measuring 12 mm, suspicious for metastases. Remainder of the upper abdomen is unremarkable. Musculoskeletal: No acute or destructive bony lesions. Reconstructed images demonstrate no additional findings. IMPRESSION: 1. 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 This mass results in extrinsic compression of the left upper lobe pulmonary artery, as well as complete obstruction of the left upper lobe bronchus and complete left upper lobe atelectasis. 2. Circumferential mural thickening of the distal thoracic esophagus. Endoscopy recommended for further evaluation. 3. Minimal left lower lobe tree-in-bud ground-glass airspace disease consistent with inflammation or infection. 4. Left adrenal thickening, nonspecific. Metastatic disease not excluded. 5.  Emphysema (ICD10-J43.9). Electronically Signed   By: Randa Ngo M.D.   On: 10/17/2019 02:01   MR BRAIN W WO CONTRAST  Result Date: 10/17/2019 CLINICAL DATA:  Seizure. Abnormal neuro exam. Hypertension. Patient stopped drinking daily alcohol 1 week ago. Confusion. EXAM: MRI HEAD WITHOUT AND WITH CONTRAST TECHNIQUE: Multiplanar, multiecho pulse sequences of the brain and surrounding structures were obtained without and with intravenous contrast. CONTRAST:  60mL GADAVIST GADOBUTROL 1 MMOL/ML IV SOLN COMPARISON:  CT head without contrast 09/26/2019. MRI of the head 04/19/2019 FINDINGS: Brain: The diffusion-weighted images demonstrate no acute or subacute infarct. Remote lacunar infarcts are present within the basal ganglia extending superiorly into the corona radiata bilaterally. Mild atrophy and white matter disease is present bilaterally, otherwise stable. The basal ganglia are otherwise unremarkable. Insular ribbon is within limits bilaterally. Study is mildly degraded by patient motion. Temporal lobes are normal. The internal auditory canals are normal bilaterally. The brainstem and cerebellum are  normal. Postcontrast images demonstrate no pathologic enhancement. Vascular: Flow is present in the major intracranial arteries. Skull and upper cervical spine: The craniocervical junction is normal. Upper cervical spine  is within normal limits. Marrow signal is unremarkable. Sinuses/Orbits: The paranasal sinuses and mastoid air cells are clear. The globes and orbits are within normal limits. IMPRESSION: 1. Remote nonhemorrhagic lacunar infarcts of the basal ganglia bilaterally extending into the corona radiata. 2. Atrophy and white matter disease are mildly advanced for age. 3. No acute or focal intracranial abnormality to explain seizures or confusion. Electronically Signed   By: San Morelle M.D.   On: 10/17/2019 05:30   CT ABDOMEN PELVIS W CONTRAST  Result Date: 10/22/2019 CLINICAL DATA:  64 year old male with history of adrenal mass suspicious for malignancy. Follow-up study. EXAM: CT ABDOMEN AND PELVIS WITH CONTRAST TECHNIQUE: Multidetector CT imaging of the abdomen and pelvis was performed using the standard protocol following bolus administration of intravenous contrast. CONTRAST:  137mL OMNIPAQUE IOHEXOL 300 MG/ML  SOLN COMPARISON:  No priors. FINDINGS: Lower chest: Atherosclerotic calcifications in the left circumflex and right coronary arteries. Hepatobiliary: Multiple ill-defined hypovascular lesions are scattered throughout the hepatic parenchyma, highly concerning for widespread metastatic disease. One specific example is the lesion at the junction of segment 8 and the caudate lobe (axial image 19 of series 3) measuring 3.2 x 2.1 cm. No intra or extrahepatic biliary ductal dilatation. Gallbladder is nearly completely collapsed with avid enhancement of the gallbladder mucosa and diffuse wall edema, without surrounding inflammatory changes. Pancreas: No pancreatic mass. No pancreatic ductal dilatation. No pancreatic or peripancreatic fluid collections or inflammatory changes. Spleen:  Unremarkable. Adrenals/Urinary Tract: Subcentimeter low-attenuation lesions in both kidneys, too small to characterize, but statistically likely to represent cysts. No hydroureteronephrosis. Urinary bladder is normal in appearance. Right adrenal gland is normal. 1.2 cm left adrenal nodule (axial image 23 of series 3), similar to the prior chest CT 10/06/2019. Stomach/Bowel: Normal appearance of the stomach. No pathologic dilatation of small bowel or colon. Numerous colonic diverticulae are noted, without surrounding inflammatory changes to suggest an acute diverticulitis at this time. Normal appendix. Vascular/Lymphatic: Aortic atherosclerosis, without evidence of aneurysm or dissection in the abdominal or pelvic vasculature. No lymphadenopathy noted in the abdomen or pelvis. Reproductive: Prostate gland and seminal vesicles are unremarkable in appearance. Other: No significant volume of ascites.  No pneumoperitoneum. Musculoskeletal: There are no aggressive appearing lytic or blastic lesions noted in the visualized portions of the skeleton. IMPRESSION: 1. Small left adrenal nodule is nonspecific. This could either be a benign or malignant lesion. 2. Importantly, there are innumerable hypovascular areas in the liver which are highly suspicious for metastatic disease. This could be better evaluated with follow-up abdominal MRI with and without IV gadolinium. 3. Colonic diverticulosis without evidence of acute diverticulitis at this time. 4. Additional incidental findings, as above. Electronically Signed   By: Vinnie Langton M.D.   On: 10/22/2019 16:40   MR LIVER W WO CONTRAST  Result Date: 10/24/2019 CLINICAL DATA:  Indeterminate liver lesions on recent CT. Recently diagnosed lung carcinoma. EXAM: MRI ABDOMEN WITHOUT AND WITH CONTRAST TECHNIQUE: Multiplanar multisequence MR imaging of the abdomen was performed both before and after the administration of intravenous contrast. CONTRAST:  32mL GADAVIST GADOBUTROL 1  MMOL/ML IV SOLN COMPARISON:  CT on 10/22/2019 FINDINGS: Lower chest: No acute findings. Hepatobiliary: Innumerable small hypovascular masses are seen throughout the liver, consistent with diffuse liver metastases. Gallbladder is nearly completely collapsed. No evidence of biliary ductal dilatation. Pancreas:  No mass or inflammatory changes. Spleen:  Within normal limits in size and appearance. Adrenals/Urinary Tract: No masses identified. Small bilateral renal cysts noted. No evidence of hydronephrosis. Stomach/Bowel: Visualized  portion unremarkable. Vascular/Lymphatic: Mild lymphadenopathy in porta hepatis measuring 1.9 cm short axis. No abdominal aortic aneurysm. Other:  None. Musculoskeletal: Diffuse enhancing T2 hyperintense bone lesions throughout the visualized portion of the thoracolumbar spine and pelvis, consistent with diffuse bone metastases. IMPRESSION: Diffuse liver metastases. Diffuse bone metastases. Mild abdominal lymphadenopathy in porta hepatis, consistent with metastatic disease. Electronically Signed   By: Marlaine Hind M.D.   On: 10/24/2019 19:23   DG Chest Port 1 View  Result Date: 11/05/2019 CLINICAL DATA:  Respiratory failure EXAM: PORTABLE CHEST 1 VIEW COMPARISON:  Yesterday FINDINGS: Extubation with lower lung volumes and interval left pulmonary collapse. The left mainstem bronchus is cut off. There is associated volume loss on the left with mediastinal shift. Reticular opacity in the right lung is unchanged. IMPRESSION: Interval collapse of the left lung. Extensive debris was seen in the airways on CT 11/02/2019. Electronically Signed   By: Monte Fantasia M.D.   On: 11/05/2019 07:22   DG Chest Port 1 View  Result Date: 11/04/2019 CLINICAL DATA:  Acute respiratory failure EXAM: PORTABLE CHEST 1 VIEW COMPARISON:  Radiograph 11/05/2019 FINDINGS: Endotracheal tube terminates in the mid trachea, 5 cm from the carina. Transesophageal tube tip is below the level of imaging with the side  port positioned near the GE junction and could be advanced at least 3 cm for optimal functioning. Diffuse mixed interstitial and patchy airspace opacity more pronounced in the left lung base with small bilateral effusions, left greater than right. No pneumothorax. No acute osseous or soft tissue abnormality. Degenerative changes are present in the imaged spine and shoulders. Telemetry leads overlie the chest. IMPRESSION: Endotracheal tube terminates in the mid trachea, 5 cm from the carina. Transesophageal tube tip is below the level of imaging with the side port positioned near the GE junction and could be advanced at least 3 cm for optimal functioning. Heterogenous interstitial and airspace disease most confluent in the left lung base with small bilateral effusions, left greater than right. Electronically Signed   By: Lovena Le M.D.   On: 11/04/2019 05:58   DG CHEST PORT 1 VIEW  Result Date: 11/01/2019 CLINICAL DATA:  Endotracheal tube placement. EXAM: PORTABLE CHEST 1 VIEW COMPARISON:  November 02, 2019. FINDINGS: Stable cardiomegaly. Endotracheal tube tip appears to be at the carina; withdrawal by 2-3 cm is recommended. Patient is rotated to the left. Right-sided PICC line is unchanged in position with tip in expected position of cavoatrial junction. No pneumothorax is noted. Left basilar atelectasis or infiltrate is noted with associated effusion. Bony thorax is unremarkable. IMPRESSION: Endotracheal tube tip appears to be at the carina; withdrawal by 2-3 cm is recommended. Left basilar atelectasis or infiltrate is noted with associated pleural effusion. These results will be called to the ordering clinician or representative by the Radiologist Assistant, and communication documented in the PACS or zVision Dashboard. Electronically Signed   By: Marijo Conception M.D.   On: 11/01/2019 09:27   DG Chest Port 1 View  Result Date: 11/02/2019 CLINICAL DATA:  Hypoxia EXAM: PORTABLE CHEST 1 VIEW COMPARISON:   11/02/2019 at 12:27 a.m. FINDINGS: Markedly rotated to the left. Endotracheal tube tip is at the level of the clavicular heads, slightly retracted from the prior study. Tip of the right approach PICC line is in the lower SVC. Right lung is clear. There is a intermediate sized left pleural effusion with left basilar consolidation/atelectasis. IMPRESSION: 1. Intermediate sized left pleural effusion with left basilar consolidation/atelectasis. 2. Endotracheal tube tip at  the level of the clavicular heads. Electronically Signed   By: Ulyses Jarred M.D.   On: 11/02/2019 03:48   DG CHEST PORT 1 VIEW  Result Date: 11/02/2019 CLINICAL DATA:  65 year old male status post intubation. EXAM: PORTABLE CHEST 1 VIEW COMPARISON:  Chest radiograph dated 11/01/2019. FINDINGS: Interval placement of an endotracheal tube with tip approximately 3 cm above the carina. Right-sided PICC in similar position. The patient is rotated. Left lung base density as seen previously. Slight interval increase in the vascular markings of the left lung. No pneumothorax. Degenerative changes of the spine. No acute osseous pathology. IMPRESSION: 1. Interval placement of an endotracheal tube with tip above the carina. 2. Left lung base density similar to prior. Electronically Signed   By: Anner Crete M.D.   On: 11/02/2019 00:50   DG Chest Port 1 View  Result Date: 11/01/2019 CLINICAL DATA:  Hemoptysis. EXAM: PORTABLE CHEST 1 VIEW COMPARISON:  October 25, 2019 FINDINGS: Mild-to-moderate severity atelectasis and/or infiltrate is seen within the left lung base. This is decreased in severity when compared to the prior study. There is no evidence of a pleural effusion or pneumothorax. The heart size and mediastinal contours are within normal limits. Mild-to-moderate severity multilevel degenerative changes seen throughout the thoracic spine. IMPRESSION: Mild-to-moderate severity left basilar atelectasis and/or infiltrate. Electronically Signed   By:  Virgina Norfolk M.D.   On: 11/01/2019 19:42   DG Chest Portable 1 View  Result Date: 10/01/2019 CLINICAL DATA:  Seizure, altered mental status EXAM: PORTABLE CHEST 1 VIEW COMPARISON:  06/06/2019 FINDINGS: Right lung is grossly clear. Asymmetric hazy opacification in the left thorax with increased left hilar density. Heart size upper limits of normal. No pneumothorax. IMPRESSION: Asymmetric hazy opacity in left thorax with asymmetric density in the left hilus, could be due to diffuse lung parenchymal air disease versus lung mass. Chest CT is recommended for further evaluation. Electronically Signed   By: Donavan Foil M.D.   On: 10/19/2019 22:42   CT ANGIO CHEST AORTA W/CM & OR WO/CM  Addendum Date: 11/02/2019   ADDENDUM REPORT: 11/02/2019 05:21 ADDENDUM: Case discussed with Dr. Deatra Canter at 5:15. Patient has active massive hemoptysis. The debris within the trachea and mainstem bronchi corresponds to hemorrhage. Some of the patchy opacities in the lungs bilaterally may reflect hemorrhage rather than infection, particularly in the superior segment left lower lobe (for example, series 8/image 31). However, when closely evaluating this region, it still appears to have a pulmonary arterial/venous supply. An aberrant bronchial artery source is not definitely identified. Prior study also noted narrowing of a left upper lobe pulmonary artery anteriorly. This is improved on the current study, presumably following chemotherapy. Electronically Signed   By: Julian Hy M.D.   On: 11/02/2019 05:21   Result Date: 11/02/2019 CLINICAL DATA:  Hemoptysis EXAM: CT ANGIOGRAPHY CHEST WITH CONTRAST TECHNIQUE: Multidetector CT imaging of the chest was performed using the standard protocol during bolus administration of intravenous contrast. Multiplanar CT image reconstructions and MIPs were obtained to evaluate the vascular anatomy. CONTRAST:  145mL OMNIPAQUE IOHEXOL 350 MG/ML SOLN COMPARISON:  Chest radiograph dated  11/02/2019. CT chest dated 10/17/2019. FINDINGS: Cardiovascular: Satisfactory opacification the pulmonary arteries to the segmental level. No evidence of pulmonary embolism. Although not tailored for evaluation of the thoracic aorta, there is no evidence of thoracic aortic aneurysm or dissection. Mild atherosclerotic calcifications of the aortic arch. The heart is top-normal in size. Small pericardial effusion inferiorly. Leftward cardiomediastinal shift. Mediastinum/Nodes: 1.9 cm short axis AP window  node, previously 2.5 cm. Additional tumor in the left hilar region, indistinguishable from the patient's known left upper lobe mass (described below). Lungs/Pleura: Endotracheal tube terminates 2 cm above the carina. Debris in the distal trachea (series 8/image 46) extending into the right mainstem bronchus (series 8/image 58) as well as the left mainstem bronchus and left lower lobe airway (series 8/image 56). Central left upper lobe mass extending to the left hilar region, difficult to discretely measure, but likely at least 2.5 x 4.0 cm (series 6/image 58). This may have measures 3.4 x 5.5 cm on the prior CT. Surrounding postobstructive opacity/atelectasis in the left upper lobe. Left lower lobe atelectasis/collapse, with additional patchy/nodular opacities in the left upper and lower lobes, suggesting pneumonia. Additional patchy opacity in the posterior right lower lobe (series 8/image 112), suggesting atelectasis, however there are patchy/nodular opacities throughout the right lung which suggests multifocal pneumonia. Mild centrilobular and paraseptal emphysematous changes. Volume loss in the left hemithorax. Small left pleural effusion. No pneumothorax. Upper Abdomen: Mild thickening of the left adrenal gland without discrete mass. Although poorly visualized, there are innumerable hepatic metastases, measuring up to 2.5 cm in the central liver (series 6/image 146). These are better evaluated on recent MRI.  Musculoskeletal: Degenerative changes of the visualized thoracolumbar spine. Mild superior endplate changes at L1. Review of the MIP images confirms the above findings. IMPRESSION: No evidence of pulmonary embolism. Left upper lobe mass extending into the left hilar region, possibly mildly decreased from prior CT. AP window nodal metastasis, mildly decreased. Multifocal pneumonia. Postobstructive opacity in the left upper lobe. Dependent atelectasis in the bilateral lower lobes, left greater than right. Small left pleural effusion. Endotracheal tube terminates 2 cm above the carina. Debris in the lower trachea and bilateral mainstem bronchi, as above. Numerous hepatic metastases, better evaluated on recent MRI. Aortic Atherosclerosis (ICD10-I70.0) and Emphysema (ICD10-J43.9). Electronically Signed: By: Julian Hy M.D. On: 11/02/2019 04:44   [place] EKG SITE RITE  Result Date: 10/28/2019 If Site Rite image not attached, placement could not be confirmed due to current cardiac rhythm.      Subjective: Patient's comfort care and expired  on November 12, 2019 I notified brother.    The results of significant diagnostics from this hospitalization (including imaging, microbiology, ancillary and laboratory) are listed below for reference.     Microbiology: Recent Results (from the past 240 hour(s))  SARS CORONAVIRUS 2 (TAT 6-24 HRS) Nasopharyngeal Nasopharyngeal Swab     Status: None   Collection Time: 10/31/19  4:28 PM   Specimen: Nasopharyngeal Swab  Result Value Ref Range Status   SARS Coronavirus 2 NEGATIVE NEGATIVE Final    Comment: (NOTE) SARS-CoV-2 target nucleic acids are NOT DETECTED. The SARS-CoV-2 RNA is generally detectable in upper and lower respiratory specimens during the acute phase of infection. Negative results do not preclude SARS-CoV-2 infection, do not rule out co-infections with other pathogens, and should not be used as the sole basis for treatment or other patient  management decisions. Negative results must be combined with clinical observations, patient history, and epidemiological information. The expected result is Negative. Fact Sheet for Patients: SugarRoll.be Fact Sheet for Healthcare Providers: https://www.woods-mathews.com/ This test is not yet approved or cleared by the [place] FDA and  has been authorized for detection and/or diagnosis of SARS-CoV-2 by FDA under an Emergency Use Authorization (EUA). This EUA will remain  in effect (meaning this test can be used) for the duration of the COVID-19 declaration under Section 56 4(b)(1) of the  Act, 21 U.S.C. section 360bbb-3(b)(1), unless the authorization is terminated or revoked sooner. Performed at [facility], [place] [address], [place], [place] [address]   MRSA PCR Screening     Status: None   Collection Time: 11/01/19  7:52 PM   Specimen: Nasal Mucosa; Nasopharyngeal  Result Value Ref Range Status   MRSA by PCR NEGATIVE NEGATIVE Final    Comment:        The GeneXpert MRSA Assay (FDA approved for NASAL specimens only), is one component of a comprehensive MRSA colonization surveillance program. It is not intended to diagnose MRSA infection nor to guide or monitor treatment for MRSA infections. Performed at [facility], [place] [address] [place], [place] [address]   Surgical PCR screen     Status: None   Collection Time: 10/29/2019 11:15 AM   Specimen: Nasal Mucosa; Nasal Swab  Result Value Ref Range Status   MRSA, PCR NEGATIVE NEGATIVE Final   Staphylococcus aureus NEGATIVE NEGATIVE Final    Comment: (NOTE) The Xpert SA Assay (FDA approved for NASAL specimens in patients 60 years of age and older), is one component of a comprehensive surveillance program. It is not intended to diagnose infection nor to guide or monitor treatment. Performed at [facility], [place] [address] [place], [place] [address]       Labs: BNP (last 3 results) No results for input(s): BNP in the last 8760 hours. Basic Metabolic Panel: Recent Labs  Lab 11/13/2019 0528 11/13/2019 0528 11/02/2019 1825 11/14/2019 2155 11/04/19 0503 11/04/19 1632 11/05/19 0523 11/05/19 0701  NA 145  --   --  141 144  --  147* 144  K 4.0  --   --  4.8 4.3  --  <2.0* 2.1*  CL 108  --   --  109 112*  --  106 104  CO2 27  --   --  23 24  --  29 28  GLUCOSE 152*  --   --  264* 286*  --  115* 128*  BUN 29*  --   --  34* 38*  --  16 16  CREATININE 0.61  --   --  0.72 0.84  --  0.41* 0.53*  CALCIUM 7.3*  --   --  7.2* 7.2*  --  7.6* 7.5*  MG 2.2   < > 2.0  --  1.9 1.8 1.8 1.8  PHOS 3.1   < > 4.2  --  3.4 1.7* 1.6* 1.7*   < > = values in this interval not displayed.   Liver Function Tests: Recent Labs  Lab 11/01/19 2347 11/04/19 0503 11/05/19 0523 11/05/19 0701  AST 73* 42* 54* 57*  ALT 194* 132* 123* 122*  ALKPHOS 314* 191* 180* 172*  BILITOT 1.2 1.0 1.5* 1.6*  PROT 4.7* 4.5* 5.0* 4.9*  ALBUMIN 2.1* 2.0* 2.0* 2.1*   No results for input(s): LIPASE, AMYLASE in the last 168 hours. No results for input(s): AMMONIA in the last 168 hours. CBC: Recent Labs  Lab 11/01/19 1907 11/01/19 2347 11/02/2019 0528 10/31/2019 1825 11/04/19 0503 11/05/19 0523 11/05/19 0701  WBC 12.8*   < > 7.4 3.0* 1.3* 0.5* 0.5*  NEUTROABS 10.4*  --   --   --  0.7* 0.3* 0.3*  HGB 9.5*   < > 7.8* 7.9* 7.1* 6.7* 6.7*  HCT 28.3*   < > 23.6* 25.0* 22.4* 20.4* 20.1*  MCV 96.6   < > 96.7 98.0 99.1 93.6 95.3  PLT  158   < > 129* 144* 122* 89* 88*   < > = values in this interval not displayed.   Cardiac Enzymes: No results for input(s): CKTOTAL, CKMB, CKMBINDEX, TROPONINI in the last 168 hours. BNP: Invalid input(s): POCBNP CBG: Recent Labs  Lab 11/04/19 1929 11/04/19 2348 11/05/19 0410 11/05/19 0733 11/05/19 1110  GLUCAP 119* 145* 108* 125* 137*   D-Dimer No results for input(s): DDIMER in the last 72 hours. Hgb A1c No results for input(s):  HGBA1C in the last 72 hours. Lipid Profile No results for input(s): CHOL, HDL, LDLCALC, TRIG, CHOLHDL, LDLDIRECT in the last 72 hours. Thyroid function studies No results for input(s): TSH, T4TOTAL, T3FREE, THYROIDAB in the last 72 hours.  Invalid input(s): FREET3 Anemia work up No results for input(s): VITAMINB12, FOLATE, FERRITIN, TIBC, IRON, RETICCTPCT in the last 72 hours. Urinalysis    Component Value Date/Time   COLORURINE YELLOW 10/14/2019 2238   APPEARANCEUR CLEAR 10/19/2019 2238   LABSPEC 1.013 09/23/2019 2238   PHURINE 7.0 10/14/2019 2238   GLUCOSEU NEGATIVE 10/15/2019 2238   HGBUR NEGATIVE 10/15/2019 2238   [place] 10/08/2019 2238   KETONESUR NEGATIVE 10/08/2019 2238   PROTEINUR 100 (A) 10/14/2019 2238   UROBILINOGEN 2.0 (H) 05/03/2010 2059   NITRITE NEGATIVE 10/15/2019 2238   LEUKOCYTESUR NEGATIVE 10/03/2019 2238   Sepsis Labs Invalid input(s): PROCALCITONIN,  WBC,  LACTICIDVEN Microbiology Recent Results (from the past 240 hour(s))  SARS CORONAVIRUS 2 (TAT 6-24 HRS) Nasopharyngeal Nasopharyngeal Swab     Status: None   Collection Time: 10/31/19  4:28 PM   Specimen: Nasopharyngeal Swab  Result Value Ref Range Status   SARS Coronavirus 2 NEGATIVE NEGATIVE Final    Comment: (NOTE) SARS-CoV-2 target nucleic acids are NOT DETECTED. The SARS-CoV-2 RNA is generally detectable in upper and lower respiratory specimens during the acute phase of infection. Negative results do not preclude SARS-CoV-2 infection, do not rule out co-infections with other pathogens, and should not be used as the sole basis for treatment or other patient management decisions. Negative results must be combined with clinical observations, patient history, and epidemiological information. The expected result is Negative. Fact Sheet for Patients: SugarRoll.be Fact Sheet for Healthcare Providers: https://www.woods-mathews.com/ This test is  not yet approved or cleared by the [place] FDA and  has been authorized for detection and/or diagnosis of SARS-CoV-2 by FDA under an Emergency Use Authorization (EUA). This EUA will remain  in effect (meaning this test can be used) for the duration of the COVID-19 declaration under Section 56 4(b)(1) of the Act, 21 U.S.C. section 360bbb-3(b)(1), unless the authorization is terminated or revoked sooner. Performed at [facility], [place] [address] [place], [place] [address]   MRSA PCR Screening     Status: None   Collection Time: 11/01/19  7:52 PM   Specimen: Nasal Mucosa; Nasopharyngeal  Result Value Ref Range Status   MRSA by PCR NEGATIVE NEGATIVE Final    Comment:        The GeneXpert MRSA Assay (FDA approved for NASAL specimens only), is one component of a comprehensive MRSA colonization surveillance program. It is not intended to diagnose MRSA infection nor to guide or monitor treatment for MRSA infections. Performed at [facility], [place] [address] [place], [place] [address]   Surgical PCR screen     Status: None   Collection Time: 11/16/2019 11:15 AM   Specimen: Nasal Mucosa; Nasal Swab  Result Value Ref Range Status   MRSA, PCR NEGATIVE NEGATIVE  Final   Staphylococcus aureus NEGATIVE NEGATIVE Final    Comment: (NOTE) The Xpert SA Assay (FDA approved for NASAL specimens in patients 58 years of age and older), is one component of a comprehensive surveillance program. It is not intended to diagnose infection nor to guide or monitor treatment. Performed at [facility], [place] [address], [place], [place] [address]      Time coordinating discharge: Over 30 minutes  SIGNED:   Nicolette Bang, MD  [facility] 13-Nov-2019, 1:01 PM Pager   If 7PM-7AM, please contact night-coverage [email or web address] Password [email or web address]

## 2019-11-20 NOTE — Plan of Care (Signed)
  Problem: Education: Goal: Expressions of having a comfortable level of knowledge regarding the disease process will increase Outcome: Progressing   Problem: Coping: Goal: Ability to adjust to condition or change in health will improve Outcome: Progressing Goal: Ability to identify appropriate support needs will improve Outcome: Progressing   Problem: Health Behavior/Discharge Planning: Goal: Compliance with prescribed medication regimen will improve Outcome: Progressing   Problem: Medication: Goal: Risk for medication side effects will decrease Outcome: Progressing   Problem: Clinical Measurements: Goal: Complications related to the disease process, condition or treatment will be avoided or minimized Outcome: Progressing Goal: Diagnostic test results will improve Outcome: Progressing   Problem: Safety: Goal: Verbalization of understanding the information provided will improve Outcome: Progressing   Problem: Education: Goal: Knowledge of General Education information will improve Description: Including pain rating scale, medication(s)/side effects and non-pharmacologic comfort measures Outcome: Progressing   Problem: Health Behavior/Discharge Planning: Goal: Ability to manage health-related needs will improve Outcome: Progressing   Problem: Clinical Measurements: Goal: Ability to maintain clinical measurements within normal limits will improve Outcome: Progressing Goal: Will remain free from infection Outcome: Progressing Goal: Diagnostic test results will improve Outcome: Progressing Goal: Respiratory complications will improve Outcome: Progressing Goal: Cardiovascular complication will be avoided Outcome: Progressing   Problem: Activity: Goal: Risk for activity intolerance will decrease Outcome: Progressing   Problem: Nutrition: Goal: Adequate nutrition will be maintained Outcome: Progressing   Problem: Coping: Goal: Level of anxiety will  decrease Outcome: Progressing   Problem: Elimination: Goal: Will not experience complications related to bowel motility Outcome: Progressing Goal: Will not experience complications related to urinary retention Outcome: Progressing   Problem: Pain Managment: Goal: General experience of comfort will improve Outcome: Progressing   Problem: Safety: Goal: Ability to remain free from injury will improve Outcome: Progressing   Problem: Skin Integrity: Goal: Risk for impaired skin integrity will decrease Outcome: Progressing

## 2019-11-20 NOTE — Progress Notes (Signed)
Ativan 2mg  and Robimul 0.2mg  wasted with L[employer] scheme manager as witness

## 2019-11-20 NOTE — Progress Notes (Signed)
Wasted 50 ml Morphine infusion with Nestor Lewandowsky RN

## 2019-11-20 DEATH — deceased

## 2019-11-21 ENCOUNTER — Ambulatory Visit: Payer: Medicaid Other

## 2019-11-24 ENCOUNTER — Ambulatory Visit: Payer: Medicaid Other | Admitting: Adult Health

## 2019-11-24 ENCOUNTER — Ambulatory Visit: Payer: Medicaid Other

## 2019-11-25 ENCOUNTER — Ambulatory Visit: Payer: Medicaid Other

## 2019-11-26 ENCOUNTER — Ambulatory Visit: Payer: Medicaid Other

## 2019-11-27 ENCOUNTER — Ambulatory Visit: Payer: Medicaid Other

## 2019-11-28 ENCOUNTER — Ambulatory Visit: Payer: Medicaid Other

## 2019-12-01 ENCOUNTER — Ambulatory Visit: Payer: Medicaid Other

## 2019-12-02 ENCOUNTER — Ambulatory Visit: Payer: Medicaid Other

## 2019-12-03 ENCOUNTER — Ambulatory Visit: Payer: Medicaid Other

## 2019-12-03 NOTE — Progress Notes (Signed)
  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  Beams/energy:   A five field 3D conformal treatment arrangement was used delivering 6 and 10 MV photons.  Daily image-guidance CT was used to align the treatment with the targeted volume  Narrative: The patient tolerated radiation treatment.  His disease was initially unstaged with a plan to proceed to definitive 59.4 Gy.  However, after the first fraction, distant metastases were confirmed, and the radiation prescription was altered to palliative 30 Gy in 10 fractions.  However, after the 6th fraction, the patient experienced respiratory failure and declining condition.  He was moved to the ICU and ultimately transitioned to comfort care. ________________________________  Sheral Apley. Tammi Klippel, M.D.

## 2019-12-04 ENCOUNTER — Ambulatory Visit: Payer: Medicaid Other

## 2019-12-05 ENCOUNTER — Ambulatory Visit: Payer: Medicaid Other

## 2019-12-08 ENCOUNTER — Ambulatory Visit: Payer: Medicaid Other

## 2019-12-09 ENCOUNTER — Ambulatory Visit: Payer: Medicaid Other
